# Patient Record
Sex: Female | Born: 1955
Health system: Southern US, Community
[De-identification: ages and names within clinical notes are randomized; demographics above are authoritative.]

## PROBLEM LIST (undated history)

## (undated) DIAGNOSIS — J449 Chronic obstructive pulmonary disease, unspecified: Secondary | ICD-10-CM

## (undated) DIAGNOSIS — R339 Retention of urine, unspecified: Secondary | ICD-10-CM

## (undated) DIAGNOSIS — K279 Peptic ulcer, site unspecified, unspecified as acute or chronic, without hemorrhage or perforation: Secondary | ICD-10-CM

## (undated) DIAGNOSIS — K219 Gastro-esophageal reflux disease without esophagitis: Secondary | ICD-10-CM

## (undated) DIAGNOSIS — N329 Bladder disorder, unspecified: Secondary | ICD-10-CM

## (undated) DIAGNOSIS — F32A Depression, unspecified: Secondary | ICD-10-CM

## (undated) DIAGNOSIS — E785 Hyperlipidemia, unspecified: Secondary | ICD-10-CM

## (undated) DIAGNOSIS — M1990 Unspecified osteoarthritis, unspecified site: Secondary | ICD-10-CM

## (undated) DIAGNOSIS — J45909 Unspecified asthma, uncomplicated: Secondary | ICD-10-CM

## (undated) DIAGNOSIS — I10 Essential (primary) hypertension: Secondary | ICD-10-CM

## (undated) DIAGNOSIS — F329 Major depressive disorder, single episode, unspecified: Secondary | ICD-10-CM

## (undated) HISTORY — PX: ABDOMINAL HYSTERECTOMY: SHX81

## (undated) HISTORY — PX: VAGINAL HYSTERECTOMY: SUR661

## (undated) HISTORY — PX: PILONIDAL CYST EXCISION: SHX744

## (undated) HISTORY — DX: Retention of urine, unspecified: R33.9

---

## 1997-11-25 ENCOUNTER — Emergency Department (HOSPITAL_COMMUNITY): Admission: EM | Admit: 1997-11-25 | Discharge: 1997-11-25 | Payer: Self-pay | Admitting: Emergency Medicine

## 2000-02-13 ENCOUNTER — Emergency Department (HOSPITAL_COMMUNITY): Admission: EM | Admit: 2000-02-13 | Discharge: 2000-02-13 | Payer: Self-pay | Admitting: Emergency Medicine

## 2005-01-13 ENCOUNTER — Inpatient Hospital Stay (HOSPITAL_COMMUNITY): Admission: EM | Admit: 2005-01-13 | Discharge: 2005-01-15 | Payer: Self-pay | Admitting: Emergency Medicine

## 2005-01-13 ENCOUNTER — Ambulatory Visit: Payer: Self-pay | Admitting: Internal Medicine

## 2007-05-31 ENCOUNTER — Emergency Department (HOSPITAL_COMMUNITY): Admission: EM | Admit: 2007-05-31 | Discharge: 2007-05-31 | Payer: Self-pay | Admitting: Emergency Medicine

## 2007-06-07 ENCOUNTER — Emergency Department (HOSPITAL_COMMUNITY): Admission: EM | Admit: 2007-06-07 | Discharge: 2007-06-07 | Payer: Self-pay | Admitting: Family Medicine

## 2007-12-24 ENCOUNTER — Emergency Department (HOSPITAL_COMMUNITY): Admission: EM | Admit: 2007-12-24 | Discharge: 2007-12-24 | Payer: Self-pay | Admitting: Emergency Medicine

## 2008-01-28 ENCOUNTER — Emergency Department (HOSPITAL_COMMUNITY): Admission: EM | Admit: 2008-01-28 | Discharge: 2008-01-28 | Payer: Self-pay | Admitting: Emergency Medicine

## 2008-01-29 ENCOUNTER — Emergency Department (HOSPITAL_COMMUNITY): Admission: EM | Admit: 2008-01-29 | Discharge: 2008-01-29 | Payer: Self-pay | Admitting: Family Medicine

## 2008-06-07 ENCOUNTER — Emergency Department (HOSPITAL_COMMUNITY): Admission: EM | Admit: 2008-06-07 | Discharge: 2008-06-07 | Payer: Self-pay | Admitting: Emergency Medicine

## 2008-09-23 ENCOUNTER — Emergency Department (HOSPITAL_COMMUNITY): Admission: EM | Admit: 2008-09-23 | Discharge: 2008-09-23 | Payer: Self-pay | Admitting: Emergency Medicine

## 2009-03-04 ENCOUNTER — Emergency Department (HOSPITAL_COMMUNITY): Admission: EM | Admit: 2009-03-04 | Discharge: 2009-03-04 | Payer: Self-pay | Admitting: Emergency Medicine

## 2009-04-13 ENCOUNTER — Emergency Department (HOSPITAL_COMMUNITY): Admission: EM | Admit: 2009-04-13 | Discharge: 2009-04-13 | Payer: Self-pay | Admitting: Emergency Medicine

## 2009-06-20 ENCOUNTER — Emergency Department (HOSPITAL_COMMUNITY): Admission: EM | Admit: 2009-06-20 | Discharge: 2009-06-20 | Payer: Self-pay | Admitting: Emergency Medicine

## 2009-07-10 ENCOUNTER — Emergency Department (HOSPITAL_COMMUNITY): Admission: EM | Admit: 2009-07-10 | Discharge: 2009-07-10 | Payer: Self-pay | Admitting: Emergency Medicine

## 2009-07-10 ENCOUNTER — Ambulatory Visit: Payer: Self-pay | Admitting: Psychiatry

## 2009-07-10 ENCOUNTER — Inpatient Hospital Stay (HOSPITAL_COMMUNITY): Admission: AD | Admit: 2009-07-10 | Discharge: 2009-07-16 | Payer: Self-pay | Admitting: Psychiatry

## 2009-08-15 ENCOUNTER — Other Ambulatory Visit: Payer: Self-pay | Admitting: Emergency Medicine

## 2009-08-16 ENCOUNTER — Observation Stay (HOSPITAL_COMMUNITY): Admission: RE | Admit: 2009-08-16 | Discharge: 2009-08-17 | Payer: Self-pay | Admitting: Internal Medicine

## 2010-02-26 ENCOUNTER — Emergency Department (HOSPITAL_COMMUNITY)
Admission: EM | Admit: 2010-02-26 | Discharge: 2010-02-26 | Payer: Self-pay | Source: Home / Self Care | Admitting: Family Medicine

## 2010-06-15 LAB — COMPREHENSIVE METABOLIC PANEL
ALT: 15 U/L (ref 0–35)
AST: 25 U/L (ref 0–37)
Albumin: 3.8 g/dL (ref 3.5–5.2)
Alkaline Phosphatase: 87 U/L (ref 39–117)
BUN: 15 mg/dL (ref 6–23)
CO2: 28 mEq/L (ref 19–32)
Calcium: 9.1 mg/dL (ref 8.4–10.5)
Chloride: 102 mEq/L (ref 96–112)
Creatinine, Ser: 0.6 mg/dL (ref 0.4–1.2)
GFR calc Af Amer: >60 mL/min (ref >60)
GFR calc non Af Amer: >60 mL/min (ref >60)
Glucose, Bld: 113 mg/dL — ABNORMAL HIGH (ref 70–99)
Potassium: 3.6 mEq/L (ref 3.5–5.1)
Sodium: 140 mEq/L (ref 135–145)
Total Bilirubin: 0.3 mg/dL (ref 0.3–1.2)
Total Protein: 6.7 g/dL (ref 6.0–8.3)

## 2010-06-15 LAB — ANA: Anti Nuclear Antibody(ANA): NEGATIVE

## 2010-06-15 LAB — B. BURGDORFI ANTIBODIES: B burgdorferi Ab IgG+IgM: 0.5 {ISR}

## 2010-06-15 LAB — DIFFERENTIAL
Basophils Absolute: 0 10*3/uL (ref 0.0–0.1)
Basophils Relative: 0 % (ref 0–1)
Eosinophils Absolute: 0.1 10*3/uL (ref 0.0–0.7)
Eosinophils Relative: 1 % (ref 0–5)
Lymphocytes Relative: 22 % (ref 12–46)
Lymphs Abs: 2.5 10*3/uL (ref 0.7–4.0)
Monocytes Absolute: 0.5 10*3/uL (ref 0.1–1.0)
Monocytes Relative: 5 % (ref 3–12)
Neutro Abs: 7.9 10*3/uL — ABNORMAL HIGH (ref 1.7–7.7)
Neutrophils Relative %: 72 % (ref 43–77)

## 2010-06-15 LAB — PARVOVIRUS B19 ANTIBODY, IGG AND IGM
Parovirus B19 IgG Abs: 5.5 Index — ABNORMAL HIGH (ref <0.9)
Parovirus B19 IgM Abs: <0.9 Index (ref <0.9)

## 2010-06-15 LAB — URINE MICROSCOPIC-ADD ON

## 2010-06-15 LAB — RPR: RPR Ser Ql: NONREACTIVE

## 2010-06-15 LAB — URINALYSIS, ROUTINE W REFLEX MICROSCOPIC
Bilirubin Urine: NEGATIVE
Glucose, UA: NEGATIVE mg/dL
Hgb urine dipstick: NEGATIVE
Ketones, ur: NEGATIVE mg/dL
Nitrite: NEGATIVE
Protein, ur: NEGATIVE mg/dL
Specific Gravity, Urine: 1.009 (ref 1.005–1.030)
Urobilinogen, UA: 0.2 mg/dL (ref 0.0–1.0)
pH: 6.5 (ref 5.0–8.0)

## 2010-06-15 LAB — CBC
HCT: 35.2 % — ABNORMAL LOW (ref 36.0–46.0)
Hemoglobin: 12.4 g/dL (ref 12.0–15.0)
MCHC: 35.3 g/dL (ref 30.0–36.0)
MCV: 90.9 fL (ref 78.0–100.0)
Platelets: 347 10*3/uL (ref 150–400)
RBC: 3.87 MIL/uL (ref 3.87–5.11)
RDW: 14 % (ref 11.5–15.5)
WBC: 11 10*3/uL — ABNORMAL HIGH (ref 4.0–10.5)

## 2010-06-15 LAB — SEDIMENTATION RATE: Sed Rate: 3 mm/hr (ref 0–22)

## 2010-06-15 LAB — RHEUMATOID FACTOR: Rheumatoid fact SerPl-aCnc: <20 IU/mL (ref 0–20)

## 2010-06-16 LAB — DIFFERENTIAL
Basophils Absolute: 0.1 10*3/uL (ref 0.0–0.1)
Basophils Relative: 0 % (ref 0–1)
Eosinophils Absolute: 0 10*3/uL (ref 0.0–0.7)
Eosinophils Relative: 0 % (ref 0–5)
Lymphocytes Relative: 19 % (ref 12–46)
Lymphs Abs: 2.6 10*3/uL (ref 0.7–4.0)
Monocytes Absolute: 1 10*3/uL (ref 0.1–1.0)
Monocytes Relative: 8 % (ref 3–12)
Neutro Abs: 9.9 10*3/uL — ABNORMAL HIGH (ref 1.7–7.7)
Neutrophils Relative %: 73 % (ref 43–77)

## 2010-06-16 LAB — URINE MICROSCOPIC-ADD ON

## 2010-06-16 LAB — HEPATIC FUNCTION PANEL
ALT: 14 U/L (ref 0–35)
AST: 18 U/L (ref 0–37)
Albumin: 3.3 g/dL — ABNORMAL LOW (ref 3.5–5.2)
Alkaline Phosphatase: 62 U/L (ref 39–117)
Bilirubin, Direct: 0.1 mg/dL (ref 0.0–0.3)
Indirect Bilirubin: 0.7 mg/dL (ref 0.3–0.9)
Total Bilirubin: 0.8 mg/dL (ref 0.3–1.2)
Total Protein: 6.3 g/dL (ref 6.0–8.3)

## 2010-06-16 LAB — RPR: RPR Ser Ql: NONREACTIVE

## 2010-06-16 LAB — URINALYSIS, ROUTINE W REFLEX MICROSCOPIC
Bilirubin Urine: NEGATIVE
Glucose, UA: NEGATIVE mg/dL
Hgb urine dipstick: NEGATIVE
Ketones, ur: NEGATIVE mg/dL
Nitrite: NEGATIVE
Protein, ur: NEGATIVE mg/dL
Specific Gravity, Urine: 1.011 (ref 1.005–1.030)
Urobilinogen, UA: 0.2 mg/dL (ref 0.0–1.0)
pH: 7 (ref 5.0–8.0)

## 2010-06-16 LAB — CBC
HCT: 34.2 % — ABNORMAL LOW (ref 36.0–46.0)
Hemoglobin: 11.6 g/dL — ABNORMAL LOW (ref 12.0–15.0)
MCHC: 33.9 g/dL (ref 30.0–36.0)
MCV: 93.9 fL (ref 78.0–100.0)
Platelets: 546 10*3/uL — ABNORMAL HIGH (ref 150–400)
RBC: 3.64 MIL/uL — ABNORMAL LOW (ref 3.87–5.11)
RDW: 15 % (ref 11.5–15.5)
WBC: 13.6 10*3/uL — ABNORMAL HIGH (ref 4.0–10.5)

## 2010-06-16 LAB — TSH: TSH: 0.486 u[IU]/mL (ref 0.350–4.500)

## 2010-06-16 LAB — URINE CULTURE
Colony Count: >=100000
Special Requests: NEGATIVE

## 2010-06-17 LAB — COMPREHENSIVE METABOLIC PANEL
ALT: 18 U/L (ref 0–35)
AST: 18 U/L (ref 0–37)
Albumin: 4 g/dL (ref 3.5–5.2)
Alkaline Phosphatase: 75 U/L (ref 39–117)
BUN: 6 mg/dL (ref 6–23)
CO2: 27 mEq/L (ref 19–32)
Calcium: 9.2 mg/dL (ref 8.4–10.5)
Chloride: 100 mEq/L (ref 96–112)
Creatinine, Ser: 0.45 mg/dL (ref 0.4–1.2)
GFR calc Af Amer: >60 mL/min (ref >60)
GFR calc non Af Amer: >60 mL/min (ref >60)
Glucose, Bld: 96 mg/dL (ref 70–99)
Potassium: 3.3 mEq/L — ABNORMAL LOW (ref 3.5–5.1)
Sodium: 136 mEq/L (ref 135–145)
Total Bilirubin: 0.2 mg/dL — ABNORMAL LOW (ref 0.3–1.2)
Total Protein: 7.1 g/dL (ref 6.0–8.3)

## 2010-06-17 LAB — DIFFERENTIAL
Band Neutrophils: 0 % (ref 0–10)
Basophils Absolute: 0 10*3/uL (ref 0.0–0.1)
Basophils Relative: 0 % (ref 0–1)
Blasts: 0 %
Eosinophils Absolute: 0.1 10*3/uL (ref 0.0–0.7)
Eosinophils Relative: 1 % (ref 0–5)
Lymphocytes Relative: 50 % — ABNORMAL HIGH (ref 12–46)
Lymphs Abs: 6.1 10*3/uL — ABNORMAL HIGH (ref 0.7–4.0)
Metamyelocytes Relative: 0 %
Monocytes Absolute: 0.9 10*3/uL (ref 0.1–1.0)
Monocytes Relative: 7 % (ref 3–12)
Myelocytes: 0 %
Neutro Abs: 5.1 10*3/uL (ref 1.7–7.7)
Neutrophils Relative %: 42 % — ABNORMAL LOW (ref 43–77)
Promyelocytes Absolute: 0 %
nRBC: 0 /100 WBC

## 2010-06-17 LAB — RAPID URINE DRUG SCREEN, HOSP PERFORMED
Amphetamines: NOT DETECTED
Barbiturates: NOT DETECTED
Benzodiazepines: NOT DETECTED
Cocaine: NOT DETECTED
Opiates: NOT DETECTED
Tetrahydrocannabinol: NOT DETECTED

## 2010-06-17 LAB — CBC
HCT: 38.7 % (ref 36.0–46.0)
Hemoglobin: 13.1 g/dL (ref 12.0–15.0)
MCHC: 34 g/dL (ref 30.0–36.0)
MCV: 93.2 fL (ref 78.0–100.0)
Platelets: 675 10*3/uL — ABNORMAL HIGH (ref 150–400)
RBC: 4.15 MIL/uL (ref 3.87–5.11)
RDW: 15.3 % (ref 11.5–15.5)
WBC: 12.2 10*3/uL — ABNORMAL HIGH (ref 4.0–10.5)

## 2010-06-17 LAB — ETHANOL
Alcohol, Ethyl (B): 236 mg/dL — ABNORMAL HIGH (ref 0–10)
Alcohol, Ethyl (B): 6 mg/dL (ref 0–10)

## 2010-06-18 ENCOUNTER — Inpatient Hospital Stay (INDEPENDENT_AMBULATORY_CARE_PROVIDER_SITE_OTHER)
Admission: RE | Admit: 2010-06-18 | Discharge: 2010-06-18 | Disposition: A | Discharge status: Home / Self Care | Payer: Self-pay | Source: Ambulatory Visit | Attending: Emergency Medicine | Admitting: Emergency Medicine

## 2010-06-18 DIAGNOSIS — J40 Bronchitis, not specified as acute or chronic: Secondary | ICD-10-CM

## 2010-06-18 DIAGNOSIS — I10 Essential (primary) hypertension: Secondary | ICD-10-CM

## 2010-07-06 LAB — CBC
HCT: 41.3 % (ref 36.0–46.0)
Hemoglobin: 14.5 g/dL (ref 12.0–15.0)
MCHC: 35.1 g/dL (ref 30.0–36.0)
MCV: 99.3 fL (ref 78.0–100.0)
Platelets: 411 10*3/uL — ABNORMAL HIGH (ref 150–400)
RBC: 4.15 MIL/uL (ref 3.87–5.11)
RDW: 13.5 % (ref 11.5–15.5)
WBC: 10.8 10*3/uL — ABNORMAL HIGH (ref 4.0–10.5)

## 2010-07-06 LAB — RAPID URINE DRUG SCREEN, HOSP PERFORMED
Amphetamines: NOT DETECTED
Barbiturates: NOT DETECTED
Benzodiazepines: POSITIVE — AB
Cocaine: NOT DETECTED
Opiates: NOT DETECTED
Tetrahydrocannabinol: NOT DETECTED

## 2010-07-06 LAB — DIFFERENTIAL
Basophils Absolute: 0.1 10*3/uL (ref 0.0–0.1)
Basophils Relative: 1 % (ref 0–1)
Eosinophils Absolute: 0.1 10*3/uL (ref 0.0–0.7)
Eosinophils Relative: 1 % (ref 0–5)
Lymphocytes Relative: 29 % (ref 12–46)
Lymphs Abs: 3.1 10*3/uL (ref 0.7–4.0)
Monocytes Absolute: 0.6 10*3/uL (ref 0.1–1.0)
Monocytes Relative: 6 % (ref 3–12)
Neutro Abs: 6.9 10*3/uL (ref 1.7–7.7)
Neutrophils Relative %: 63 % (ref 43–77)

## 2010-07-06 LAB — POCT I-STAT, CHEM 8
BUN: 16 mg/dL (ref 6–23)
Calcium, Ion: 1.14 mmol/L (ref 1.12–1.32)
Chloride: 98 mEq/L (ref 96–112)
Creatinine, Ser: 0.6 mg/dL (ref 0.4–1.2)
Glucose, Bld: 104 mg/dL — ABNORMAL HIGH (ref 70–99)
HCT: 47 % — ABNORMAL HIGH (ref 36.0–46.0)
Hemoglobin: 16 g/dL — ABNORMAL HIGH (ref 12.0–15.0)
Potassium: 3.8 mEq/L (ref 3.5–5.1)
Sodium: 131 mEq/L — ABNORMAL LOW (ref 135–145)
TCO2: 24 mmol/L (ref 0–100)

## 2010-07-06 LAB — ETHANOL: Alcohol, Ethyl (B): <5 mg/dL (ref 0–10)

## 2010-08-14 NOTE — Discharge Summary (Signed)
NAMERoma Scott               ACCOUNT NO.:  [id number]   MEDICAL RECORD NO.:  [id number]          PATIENT TYPE:  INP   LOCATION:  3030                         FACILITY:  [facility]   PHYSICIAN:  Duncan Dull, M.D.     DATE OF BIRTH:  02-01-1956   DATE OF ADMISSION:  01/13/2005  DATE OF DISCHARGE:  01/15/2005                                 DISCHARGE SUMMARY   DISCHARGE DIAGNOSES:  1.  Upper gastrointestinal bleed secondary to duodenal ulcer with positive      Helicobacter pylori.  2.  Anemia.  3.  Urinary tract infection.  4.  Chronic low back pain.   DISCHARGE MEDICATIONS:  1.  Darvocet 50/325 one to two p.o. q.4h. p.r.n.  2.  Nicotine patch 21 mg one patch daily x2 weeks, then 14 mg patch daily x1      week, then 7 mg patch daily for one week, then stop.  3.  Amoxicillin 1000 mg p.o. b.i.d. x12 days.  4.  Clarithromycin 500 mg p.o. daily x12 more days.  5.  Ciprofloxacin 250 mg p.o. b.i.d. x2 more days.  6.  Protonix 40 mg or omeprazole 20 mg one p.o. b.i.d. x12 more days and      once a day x6 weeks.   CONDITION ON DISCHARGE:  Stable and improved.  The patient was instructed to  follow up in the outpatient clinic with Dr. Landis Martins on Wednesday, January 18, 2005 at 3:30 p.m.  Patient was instructed to stop taking Goody powder, stop  drinking alcohol, and stop smoking cigarettes.  She was also instructed to  stop taking aspirin, ibuprofen, Naproxen, or other NSAIDs and was instructed  to only use Tylenol for pain besides the Darvocet.  A stat CBC will need to  be drawn at hospital follow-up.   PROCEDURES:  1.  Chest x-ray on January 13, 2005 showed no acute cardiopulmonary      findings.  2.  X-ray of the pelvis on January 13, 2005 showed no significant bony      findings.  3.  X-ray of the lumbar spine on January 13, 2005 showed normal alignment      and no acute bony findings with dense aortic calcifications.  4.  Upper endoscopy on January 13, 2005 revealed an ulcer in  the duodenal      bulb at 2 o'clock that measured 12 x 14 mm.  It was actively bleeding      and oozing.  The ulcer was washed with water.  An image was taken.  RUT      was done.  Results are pending.  The ulcer was injected with epinephrine      which stopped the bleeding.  She also had normal V-line at 35 cm,      gastritis in the antrum and CLO was done.   CONSULTANTS:  Dr. Luther Parody, GI   ADMISSION HISTORY AND PHYSICAL:  Please see the chart for full details of  the admission H&P.  In summary, Monica Scott is a 55 year old Caucasian  female with a history of peptic ulcer disease and  low back pain for which  she uses three to four packs of Goody powders per day, tobacco abuse,  alcohol abuse, and neurogenic bladder who presents with a complaint of tarry  stools x2 days.  Patient noticed her first tarry stool yesterday, one day  prior to admission.  She had four bowel movements and two today, all were  tarry.  She had some suprapubic pain associated with her bowel movements on  the day of admission which were relieved by defecation and she has no other  abdominal pain.  She denies nausea, vomiting.  She has never had melena in  the past; however, she does have a history of peptic ulcer disease, no  bleeding and it was diagnosed with an upper GI series 15 years ago.  Patient  uses three to four packs of Goody powder per day for low back pain which is  chronic secondary to her job.  She drinks six to eight beers per day.  She  complains of indigestion times a few days which is unusual for her.  Patient  also smokes one and a half packs per day x25 years.  She has a history of  neurogenic bladder and self catheterizations q.3-4h. x10 years.  She  recently noted some blood on her catheter, but denies pain or gross  hematuria.  She denies chest pain, shortness of breath, palpitations.   REVIEW OF SYSTEMS:  Positive for fatigue and weakness, chronic cough,  nonproductive.  She normally has  one formed brown stool per day.  Denies  hematochezia.   PHYSICAL EXAMINATION:  VITAL SIGNS:  Temperature 98.6, blood pressure 115-  138/73-85, pulse 78-113, respirations 20, oxygen saturation 96% on room air.  GENERAL:  She was in no acute distress.  HEENT:  Eyes:  Pupils were equally round and reactive to light and  accommodation.  Extraocular movements were intact.  Her conjunctivae showed  no pallor and she was anicteric.  ENT:  She had normal oropharynx mucosa,  poor dentition, and a white plaque on her tongue.  NECK:  Supple with no thyromegaly, no bruits, and no JVD.  LUNGS:  Respirations were clear to auscultation bilaterally with good air  movement.  No rales, wheezes, or crackles were heard.  CARDIOVASCULAR:  Normal S1 and S2.  Regular rate and rhythm.  No murmurs,  rubs, or gallops.  GASTROINTESTINAL:  Soft, nontender.  Bowel sounds were present.  The liver  edge was palpable and there was no splenomegaly.  EXTREMITIES:  No edema.  Pulses were 2+.  SKIN:  Freckled.  LYMPH:  No lymphadenopathy except for a right axillary nodule which was  small, nontender, and mobile.  MUSCULOSKELETAL:  Normal range of motion.  NEUROLOGIC:  Grossly nonfocal.  PSYCHIATRIC:  Appropriate.   ADMISSION LABORATORIES:  WBC count was 7.8, RBC 3.66, hemoglobin 12.2,  hematocrit 35.2, MCV 96.2, platelets 376.  She was fecal occult blood  positive.  Her reticulocyte count was 1.8%.  RBC 3.34.  Absolute  reticulocyte was 60.1.  PT 12.5, INR 0.9, PTT 24.  Sodium 130, potassium  3.6, chloride 98, bicarbonate 24, glucose 99, BUN 13, creatinine 0.7,  calcium 8.5, total protein 5.9, albumin 4.  AST 23, ALT 17, alkaline  phosphatase 71, total bilirubin 0.5.  TSH was normal at 2.47.  Iron was 61,  TIBC 245, percent saturation 25, ferritin 69.  Beta hCG was less than 2.  Alcohol level was less than 5.  UA was positive for trace leukocyte  esterase,  otherwise it was negative.  Helicobacter culture was positive  for urease.   HOSPITAL COURSE:  #1 - UPPER GASTROINTESTINAL BLEED SECONDARY TO DUODENAL  ULCER:  Secondary to chronic use of Goody powders, alcohol, and tobacco with  H. pylori test positivity.  The patient was admitted for melena and Dr.  Luther Parody was consulted on the day of admission, took her for upper  endoscopy on the day of admission which revealed a 12 x 14 mm duodenal ulcer  that was actively bleeding.  Biopsy for H. pylori and CLO test was positive.  The ulcer was injected with epinephrine with success in stopping the  bleeding.  Patient did not experience any further episodes of melena.  The  patient did not ever require a transfusion while in the hospital and was  discharged in stable and improved condition without any evidence of melena  or hematochezia.  She was given instructions to complete a course of  antibiotic treatment for the H. pylori as well as she was given instructions  to continue PPI treatment for six weeks.  Once the H. pylori has been  treated a stool sample should be collected six weeks later to test for H.  pylori at that time.  If it is still positive she will need to be treated  again  for H. pylori. Patient was instructed to discontinue Goody powder, alcohol,  cigarettes, and any other NSAIDs.   #2 ANEMIA:  secondary to iron deficiency.  Iron studies were checked and her  iron level was 61, TIBC 245, percent saturation 25, and her ferritin was 69.  Therefore, it might be considered to start the patient on iron therapy as an  outpatient.  She will also need routine screening colonoscopy to work up the  iron deficiency anemia if it persists as the patient is 55 years old and  next year will be age-appropriate for screening colonoscopy.  The patient's  iron deficiency anemia right now is most likely secondary to the oozing  upper GI bleed that she experienced during this admission.  Iron studies  should be repeated as an outpatient in light of this  information.   #3 - URINARY TRACT INFECTION:  The patient has a neurogenic bladder  secondary to some pelvic surgery she had about 10 years ago and has had to  self catheterize since then q.3-4h.  She denies any urinary tract infection  symptoms; however, with a slightly dirty urine we treated her empirically  with Cipro for a UTI.  The patient was to complete a course of three days of  Cipro and was given a prescription for the remaining two days of Cipro to be  taken as an outpatient.   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  However, pelvic x-ray as well as a lumbar spine x-  ray were done to rule out any occult fractures.  The pelvic x-ray and lumbar  spine x-ray were both negative for fractures or any other abnormalities that  would explain the low back pain.  The patient was given Darvocet in order to control her low back pain which did provide her with relief.  The patient  was taking Marlin Canary powders several times a day in order to control her low  back pain which is how she ended up getting the duodenal ulcer.  It was  explained to the patient that she must discontinue all Goody powders as  well  as NSAIDs in order to prevent a repeat upper GI bleed.  She expressed  understanding and was receiving relief with the Darvocet which has Tylenol  in it, so should not cause the upper GI bleed to recur.   DISCHARGE LABORATORIES AND VITAL SIGNS:  On the day of discharge her vital  signs were temperature 97, pulse 90, respirations 20, blood pressure 129/83.  She was saturating at 99% on room air.  White blood cell count was 6.3, RBC  3.39, hemoglobin 11.3, hematocrit 32.9, MCV 97.1, RDW 13.3, platelets 361.  Sodium 138, potassium 3.7, chloride 108, bicarbonate 23, glucose 107, BUN 4,  creatinine 0.8, calcium 8.4.  Fasting lipid profile:  Cholesterol 169,  triglycerides 150, HDL 44, LDL 95, VLDL 30.  TSH was normal at 2.47.    PENDING LABORATORIES:  None.     ______________________________  Chauncey Reading, D.O.      Duncan Dull, M.D.  Electronically Signed    EA/MEDQ  D:  02/22/2005  T:  02/23/2005  Job:  [id number]   cc:   Althea Grimmer. Luther Parody, M.D.  Fax: [phone]

## 2010-08-14 NOTE — Consult Note (Signed)
NAMEFrancisco Scott NO.:  [id number]   MEDICAL RECORD NO.:  [id number]          PATIENT TYPE:  EMS   LOCATION:  MAJO                         FACILITY:  [facility]   PHYSICIAN:  Althea Grimmer. Santogade, M.D.DATE OF BIRTH:  1955-05-21   DATE OF CONSULTATION:  01/13/2005  DATE OF DISCHARGE:                                   CONSULTATION   HISTORY OF PRESENT ILLNESS:  Ms. Monica Scott is 55 year old female who presents  to the emergency room complaining of black tarry diarrhea yesterday with a  repeat episode this morning.  She denies abdominal pain, however, she  reports that she had a history of ulcer disease over 10 years ago diagnosed  on an upper GI series. She does complain of feeling dizzy and fatigued and  says her appetite is decreased. She has been having back pain and using 3-4  packets of Goody powder per day. She also smokes a pack and a half of  cigarettes per day and has six beers nightly.   PAST MEDICAL HISTORY:  1.  Hysterectomy.  2.  Reported neurogenic bladder with self-catheterization five or six times      a day.  3.  She has chronic back pain problems.  4.  Polysubstance abuse.   CURRENT MEDICATIONS:  1.  Goody powders.  2.  Sudafed.  3.  Multivitamins.   ALLERGIES:  None reported.   FAMILY HISTORY:  Reportedly, her sister has colonic polyps.   SOCIAL HISTORY:  Smokes a pack and a half of cigarettes per day, drinks at  least six beers every night.  Cleans houses for a living.   REVIEW OF SYSTEMS:  As noted in above problems.   PHYSICAL EXAMINATION:  VITAL SIGNS:  She is afebrile and in no acute  distress. Blood pressure 115/73, pulse 78 when lying down but, on admission,  it was 113.  SKIN:  normal.  EENT:  Anicteric oropharynx nicotine stains poor dentition.  NECK:  Supple without thyromegaly.  CHEST:  A few scattered rhonchi.  HEART:  Sounds regular rate and rhythm.  ABDOMEN:  Soft. Liver edge is palpable. There is no splenomegaly or  tenderness. Bowel sounds were normal.  RECTAL:  Exam was not repeated but was guaiac positive.  EXTREMITIES:  Without cyanosis, clubbing, edema or rash.   LABORATORY DATA:  Hemoglobin of 12.2, BUN of 13. Liver function tests are  normal. Fecal occult blood testing positive.   IMPRESSION:  This is a 55 year old female with a probable upper GI bleed,  which is mild to moderate.  She is at the great risk for gastritis or more  likely ulcer disease due to alcohol abuse, heavy smoking and use of Goody  powders.   PLAN:  Urgent upper endoscopy will be performed tonight.  Please see that  report and further recommendations.      Althea Grimmer. Luther Parody, M.D.  Electronically Signed     PJS/MEDQ  D:  01/13/2005  T:  01/13/2005  Job:  [id number]   cc:   Duncan Dull, M.D.  Fax: [phone]

## 2010-11-11 ENCOUNTER — Inpatient Hospital Stay (INDEPENDENT_AMBULATORY_CARE_PROVIDER_SITE_OTHER)
Admission: RE | Admit: 2010-11-11 | Discharge: 2010-11-11 | Disposition: A | Discharge status: Home / Self Care | Payer: Self-pay | Source: Ambulatory Visit | Attending: Emergency Medicine | Admitting: Emergency Medicine

## 2010-11-11 DIAGNOSIS — J45909 Unspecified asthma, uncomplicated: Secondary | ICD-10-CM

## 2010-12-21 LAB — RAPID URINE DRUG SCREEN, HOSP PERFORMED
Amphetamines: NOT DETECTED
Barbiturates: NOT DETECTED
Benzodiazepines: NOT DETECTED
Cocaine: NOT DETECTED
Opiates: NOT DETECTED
Tetrahydrocannabinol: NOT DETECTED

## 2010-12-21 LAB — URINALYSIS, ROUTINE W REFLEX MICROSCOPIC
Bilirubin Urine: NEGATIVE
Glucose, UA: NEGATIVE
Hgb urine dipstick: NEGATIVE
Ketones, ur: NEGATIVE
Nitrite: NEGATIVE
Protein, ur: NEGATIVE
Specific Gravity, Urine: 1.008
Urobilinogen, UA: 0.2
pH: 6

## 2010-12-21 LAB — DIFFERENTIAL
Basophils Absolute: 0.1
Basophils Relative: 1
Eosinophils Absolute: 0
Eosinophils Relative: 0
Lymphocytes Relative: 17
Lymphs Abs: 1.8
Monocytes Absolute: 0.4
Monocytes Relative: 4
Neutro Abs: 8.7 — ABNORMAL HIGH
Neutrophils Relative %: 79 — ABNORMAL HIGH

## 2010-12-21 LAB — HEPATIC FUNCTION PANEL
ALT: 16
AST: 21
Albumin: 3.8
Alkaline Phosphatase: 78
Bilirubin, Direct: 0.2
Indirect Bilirubin: 0.5
Total Bilirubin: 0.7
Total Protein: 5.9 — ABNORMAL LOW

## 2010-12-21 LAB — CBC
HCT: 42.9
Hemoglobin: 14.9
MCHC: 34.7
MCV: 97.2
Platelets: 397
RBC: 4.42
RDW: 13.7
WBC: 11 — ABNORMAL HIGH

## 2010-12-21 LAB — CALCIUM: Calcium: 9.4

## 2010-12-21 LAB — I-STAT 8, (EC8 V) (CONVERTED LAB)
Acid-Base Excess: 1
BUN: 9
Bicarbonate: 25.8 — ABNORMAL HIGH
Chloride: 104
Glucose, Bld: 134 — ABNORMAL HIGH
HCT: 48 — ABNORMAL HIGH
Hemoglobin: 16.3 — ABNORMAL HIGH
Operator id: 295021
Potassium: 3.8
Sodium: 135
TCO2: 27
pCO2, Ven: 40 — ABNORMAL LOW
pH, Ven: 7.418 — ABNORMAL HIGH

## 2010-12-21 LAB — POCT CARDIAC MARKERS
CKMB, poc: <1 — ABNORMAL LOW
CKMB, poc: <1 — ABNORMAL LOW
Myoglobin, poc: 40.5
Myoglobin, poc: 40.6
Operator id: 234501
Operator id: 295021
Troponin i, poc: <0.05
Troponin i, poc: <0.05

## 2010-12-21 LAB — POCT I-STAT CREATININE
Creatinine, Ser: 0.8
Operator id: 295021

## 2010-12-21 LAB — D-DIMER, QUANTITATIVE: D-Dimer, Quant: <0.22

## 2010-12-21 LAB — ETHANOL: Alcohol, Ethyl (B): <5

## 2010-12-21 LAB — LIPASE, BLOOD: Lipase: 25

## 2010-12-21 LAB — MAGNESIUM: Magnesium: 2.1

## 2010-12-21 LAB — PHOSPHORUS: Phosphorus: 3.5

## 2010-12-28 LAB — PROTIME-INR
INR: 0.9
Prothrombin Time: 11.9

## 2010-12-28 LAB — LIPASE, BLOOD: Lipase: 31

## 2010-12-28 LAB — COMPREHENSIVE METABOLIC PANEL
ALT: 28
AST: 51 — ABNORMAL HIGH
Albumin: 4.4
Alkaline Phosphatase: 86
BUN: 7
CO2: 22
Calcium: 9.5
Chloride: 102
Creatinine, Ser: 0.53
GFR calc Af Amer: >60
GFR calc non Af Amer: >60
Glucose, Bld: 115 — ABNORMAL HIGH
Potassium: 5.5 — ABNORMAL HIGH
Sodium: 134 — ABNORMAL LOW
Total Bilirubin: 1.4 — ABNORMAL HIGH
Total Protein: 7.1

## 2010-12-28 LAB — DIFFERENTIAL
Basophils Absolute: 0.2 — ABNORMAL HIGH
Basophils Relative: 2 — ABNORMAL HIGH
Eosinophils Absolute: 0.2
Eosinophils Relative: 2
Lymphocytes Relative: 23
Lymphs Abs: 1.9
Monocytes Absolute: 0.6
Monocytes Relative: 7
Neutro Abs: 5.3
Neutrophils Relative %: 66

## 2010-12-28 LAB — CBC
HCT: 45.8
Hemoglobin: 15.3 — ABNORMAL HIGH
MCHC: 33.4
MCV: 97.6
Platelets: 188
RBC: 4.7
RDW: 14.6
WBC: 8.2

## 2010-12-28 LAB — OCCULT BLOOD X 1 CARD TO LAB, STOOL: Fecal Occult Bld: NEGATIVE

## 2010-12-29 LAB — URINE MICROSCOPIC-ADD ON

## 2010-12-29 LAB — URINALYSIS, ROUTINE W REFLEX MICROSCOPIC
Bilirubin Urine: NEGATIVE
Glucose, UA: NEGATIVE
Hgb urine dipstick: NEGATIVE
Ketones, ur: 15 — AB
Nitrite: NEGATIVE
Protein, ur: NEGATIVE
Specific Gravity, Urine: 1.015
Urobilinogen, UA: 0.2
pH: 6

## 2010-12-29 LAB — POCT I-STAT, CHEM 8
BUN: 8
Calcium, Ion: 1.17
Chloride: 102
Creatinine, Ser: 0.7
Glucose, Bld: 102 — ABNORMAL HIGH
HCT: 47 — ABNORMAL HIGH
Hemoglobin: 16 — ABNORMAL HIGH
Potassium: 4.3
Sodium: 136
TCO2: 26

## 2011-06-16 ENCOUNTER — Encounter: Payer: Self-pay | Admitting: Physician Assistant

## 2011-07-22 ENCOUNTER — Encounter: Payer: Self-pay | Admitting: Physician Assistant

## 2011-08-25 ENCOUNTER — Encounter: Payer: Self-pay | Admitting: Obstetrics and Gynecology

## 2011-09-06 ENCOUNTER — Encounter (HOSPITAL_COMMUNITY): Payer: Self-pay | Admitting: *Deleted

## 2011-09-06 ENCOUNTER — Emergency Department (HOSPITAL_COMMUNITY)
Admission: EM | Admit: 2011-09-06 | Discharge: 2011-09-06 | Disposition: A | Discharge status: Home / Self Care | Payer: Self-pay | Attending: Emergency Medicine | Admitting: Emergency Medicine

## 2011-09-06 DIAGNOSIS — N390 Urinary tract infection, site not specified: Secondary | ICD-10-CM | POA: Insufficient documentation

## 2011-09-06 DIAGNOSIS — F17200 Nicotine dependence, unspecified, uncomplicated: Secondary | ICD-10-CM | POA: Insufficient documentation

## 2011-09-06 DIAGNOSIS — I10 Essential (primary) hypertension: Secondary | ICD-10-CM | POA: Insufficient documentation

## 2011-09-06 HISTORY — DX: Unspecified asthma, uncomplicated: J45.909

## 2011-09-06 HISTORY — DX: Essential (primary) hypertension: I10

## 2011-09-06 LAB — URINE MICROSCOPIC-ADD ON

## 2011-09-06 LAB — URINALYSIS, ROUTINE W REFLEX MICROSCOPIC
Bilirubin Urine: NEGATIVE
Glucose, UA: NEGATIVE mg/dL
Ketones, ur: NEGATIVE mg/dL
Nitrite: NEGATIVE
Protein, ur: NEGATIVE mg/dL
Specific Gravity, Urine: 1.003 — ABNORMAL LOW (ref 1.005–1.030)
Urobilinogen, UA: 0.2 mg/dL (ref 0.0–1.0)
pH: 7 (ref 5.0–8.0)

## 2011-09-06 MED ORDER — LISINOPRIL-HYDROCHLOROTHIAZIDE 10-12.5 MG PO TABS: 1.0000 | ORAL_TABLET | Freq: Every day | ORAL | Status: DC

## 2011-09-06 MED ORDER — NITROFURANTOIN MONOHYD MACRO 100 MG PO CAPS: 100.0000 mg | ORAL_CAPSULE | Freq: Two times a day (BID) | ORAL | Status: DC

## 2011-09-06 NOTE — Discharge Instructions (Signed)
Arterial Hypertension Arterial hypertension (high blood pressure) is a condition of elevated pressure in your blood vessels. Hypertension over a long period of time is a risk factor for strokes, heart attacks, and heart failure. It is also the leading cause of kidney (renal) failure.  CAUSES   In Adults -- Over 90% of all hypertension has no known cause. This is called essential or primary hypertension. In the other 10% of people with hypertension, the increase in blood pressure is caused by another disorder. This is called secondary hypertension. Important causes of secondary hypertension are:   Heavy alcohol use.   Obstructive sleep apnea.   Hyperaldosterosim (Conn's syndrome).   Steroid use.   Chronic kidney failure.   Hyperparathyroidism.   Medications.   Renal artery stenosis.   Pheochromocytoma.   Cushing's disease.   Coarctation of the aorta.   Scleroderma renal crisis.   Licorice (in excessive amounts).   Drugs (cocaine, methamphetamine).  Your caregiver can explain any items above that apply to you.  In Children -- Secondary hypertension is more common and should always be considered.   Pregnancy -- Few women of childbearing age have high blood pressure. However, up to 10% of them develop hypertension of pregnancy. Generally, this will not harm the woman. It Even be a sign of 3 complications of pregnancy: preeclampsia, HELLP syndrome, and eclampsia. Follow up and control with medication is necessary.  SYMPTOMS   This condition normally does not produce any noticeable symptoms. It is usually found during a routine exam.   Malignant hypertension is a late problem of high blood pressure. It Monger have the following symptoms:   Headaches.   Blurred vision.   End-organ damage (this means your kidneys, heart, lungs, and other organs are being damaged).   Stressful situations can increase the blood pressure. If a person with normal blood pressure has their blood  pressure go up while being seen by their caregiver, this is often termed "white coat hypertension." Its importance is not known. It Alkire be related with eventually developing hypertension or complications of hypertension.   Hypertension is often confused with mental tension, stress, and anxiety.  DIAGNOSIS  The diagnosis is made by 3 separate blood pressure measurements. They are taken at least 1 week apart from each other. If there is organ damage from hypertension, the diagnosis Lemire be made without repeat measurements. Hypertension is usually identified by having blood pressure readings:  Above 140/90 mmHg measured in both arms, at 3 separate times, over a couple weeks.   Over 130/80 mmHg should be considered a risk factor and Grisanti require treatment in patients with diabetes.  Blood pressure readings over 120/80 mmHg are called "pre-hypertension" even in non-diabetic patients. To get a true blood pressure measurement, use the following guidelines. Be aware of the factors that can alter blood pressure readings.  Take measurements at least 1 hour after caffeine.   Take measurements 30 minutes after smoking and without any stress. This is another reason to quit smoking - it raises your blood pressure.   Use a proper cuff size. Ask your caregiver if you are not sure about your cuff size.   Most home blood pressure cuffs are automatic. They will measure systolic and diastolic pressures. The systolic pressure is the pressure reading at the start of sounds. Diastolic pressure is the pressure at which the sounds disappear. If you are elderly, measure pressures in multiple postures. Try sitting, lying or standing.   Sit at rest for a minimum of   5 minutes before taking measurements.   You should not be on any medications like decongestants. These are found in many cold medications.   Record your blood pressure readings and review them with your caregiver.  If you have hypertension:  Your caregiver  may do tests to be sure you do not have secondary hypertension (see "causes" above).   Your caregiver may also look for signs of metabolic syndrome. This is also called Syndrome X or Insulin Resistance Syndrome. You may have this syndrome if you have type 2 diabetes, abdominal obesity, and abnormal blood lipids in addition to hypertension.   Your caregiver will take your medical and family history and perform a physical exam.   Diagnostic tests may include blood tests (for glucose, cholesterol, potassium, and kidney function), a urinalysis, or an EKG. Other tests may also be necessary depending on your condition.  PREVENTION  There are important lifestyle issues that you can adopt to reduce your chance of developing hypertension:  Maintain a normal weight.   Limit the amount of salt (sodium) in your diet.   Exercise often.   Limit alcohol intake.   Get enough potassium in your diet. Discuss specific advice with your caregiver.   Follow a DASH diet (dietary approaches to stop hypertension). This diet is rich in fruits, vegetables, and low-fat dairy products, and avoids certain fats.  PROGNOSIS  Essential hypertension cannot be cured. Lifestyle changes and medical treatment can lower blood pressure and reduce complications. The prognosis of secondary hypertension depends on the underlying cause. Many people whose hypertension is controlled with medicine or lifestyle changes can live a normal, healthy life.  RISKS AND COMPLICATIONS  While high blood pressure alone is not an illness, it often requires treatment due to its short- and long-term effects on many organs. Hypertension increases your risk for:  CVAs or strokes (cerebrovascular accident).   Heart failure due to chronically high blood pressure (hypertensive cardiomyopathy).   Heart attack (myocardial infarction).   Damage to the retina (hypertensive retinopathy).   Kidney failure (hypertensive nephropathy).  Your caregiver can  explain list items above that apply to you. Treatment of hypertension can significantly reduce the risk of complications. TREATMENT   For overweight patients, weight loss and regular exercise are recommended. Physical fitness lowers blood pressure.   Mild hypertension is usually treated with diet and exercise. A diet rich in fruits and vegetables, fat-free dairy products, and foods low in fat and salt (sodium) can help lower blood pressure. Decreasing salt intake decreases blood pressure in a 1/3 of people.   Stop smoking if you are a smoker.  The steps above are highly effective in reducing blood pressure. While these actions are easy to suggest, they are difficult to achieve. Most patients with moderate or severe hypertension end up requiring medications to bring their blood pressure down to a normal level. There are several classes of medications for treatment. Blood pressure pills (antihypertensives) will lower blood pressure by their different actions. Lowering the blood pressure by 10 mmHg may decrease the risk of complications by as much as 25%. The goal of treatment is effective blood pressure control. This will reduce your risk for complications. Your caregiver will help you determine the best treatment for you according to your lifestyle. What is excellent treatment for one person, may not be for you. HOME CARE INSTRUCTIONS   Do not smoke.   Follow the lifestyle changes outlined in the "Prevention" section.   If you are on medications, follow the directions   carefully. Blood pressure medications must be taken as prescribed. Skipping doses reduces their benefit. It also puts you at risk for problems.   Follow up with your caregiver, as directed.   If you are asked to monitor your blood pressure at home, follow the guidelines in the "Diagnosis" section above.  SEEK MEDICAL CARE IF:   You think you are having medication side effects.   You have recurrent headaches or lightheadedness.     You have swelling in your ankles.   You have trouble with your vision.  SEEK IMMEDIATE MEDICAL CARE IF:   You have sudden onset of chest pain or pressure, difficulty breathing, or other symptoms of a heart attack.   You have a severe headache.   You have symptoms of a stroke (such as sudden weakness, difficulty speaking, difficulty walking).  MAKE SURE YOU:   Understand these instructions.   Will watch your condition.   Will get help right away if you are not doing well or get worse.  Document Released: 03/15/2005 Document Revised: 03/04/2011 Document Reviewed: 10/13/2006 [facility] Patient Information 2012 [place], [place].Urinary Tract Infection Infections of the urinary tract can start in several places. A bladder infection (cystitis), a kidney infection (pyelonephritis), and a prostate infection (prostatitis) are different types of urinary tract infections (UTIs). They usually get better if treated with medicines (antibiotics) that kill germs. Take all the medicine until it is gone. You or your child may feel better in a few days, but TAKE ALL MEDICINE or the infection may not respond and may become more difficult to treat. HOME CARE INSTRUCTIONS   Drink enough water and fluids to keep the urine clear or pale yellow. Cranberry juice is especially recommended, in addition to large amounts of water.   Avoid caffeine, tea, and carbonated beverages. They tend to irritate the bladder.   Alcohol may irritate the prostate.   Only take over-the-counter or prescription medicines for pain, discomfort, or fever as directed by your caregiver.  To prevent further infections:  Empty the bladder often. Avoid holding urine for long periods of time.   After a bowel movement, women should cleanse from front to back. Use each tissue only once.   Empty the bladder before and after sexual intercourse.  FINDING OUT THE RESULTS OF YOUR TEST Not all test results are available during your visit. If  your or your child's test results are not back during the visit, make an appointment with your caregiver to find out the results. Do not assume everything is normal if you have not heard from your caregiver or the medical facility. It is important for you to follow up on all test results. SEEK MEDICAL CARE IF:   There is back pain.   Your baby is older than 3 months with a rectal temperature of 100.5 F (38.1 C) or higher for more than 1 day.   Your or your child's problems (symptoms) are no better in 3 days. Return sooner if you or your child is getting worse.  SEEK IMMEDIATE MEDICAL CARE IF:   There is severe back pain or lower abdominal pain.   You or your child develops chills.   You have a fever.   Your baby is older than 3 months with a rectal temperature of 102 F (38.9 C) or higher.   Your baby is 29 months old or younger with a rectal temperature of 100.4 F (38 C) or higher.   There is nausea or vomiting.   There is continued  burning or discomfort with urination.  MAKE SURE YOU:   Understand these instructions.   Will watch your condition.   Will get help right away if you are not doing well or get worse.  Document Released: 12/23/2004 Document Revised: 03/04/2011 Document Reviewed: 07/28/2006 [facility] Patient Information 2012 [place], [place].

## 2011-09-06 NOTE — ED Provider Notes (Signed)
History     CSN: [id number]  Arrival date & time 09/06/11  1907   First MD Initiated Contact with Patient 09/06/11 1959      Chief Complaint  Patient presents with  . Hypertension    (Consider location/radiation/quality/duration/timing/severity/associated sxs/prior treatment) HPI Comments: Patient here because she is out of her blood pressure medication for the past month - she states that she has been having headaches associated with her high blood pressure - states she has been taking her brother's medication as well.  She is also here with a fever and lower back pain - she states that she had been working in the yard and initially thought her symptoms were related to this but noted chills and a fever today - she reports that she does self cath and she has a tendency to get UTI's and she thinks this is related.  She   Patient is a 56 y.o. female presenting with hypertension. The history is provided by the patient. No language interpreter was used.  Hypertension This is a chronic problem. The current episode started 1 to 4 weeks ago. The problem occurs constantly. The problem has been unchanged. Associated symptoms include chills, a fever, headaches and urinary symptoms. Pertinent negatives include no abdominal pain, anorexia, arthralgias, change in bowel habit, chest pain, congestion, coughing, diaphoresis, fatigue, joint swelling, myalgias, nausea, neck pain, numbness, rash, sore throat, swollen glands, vertigo, visual change, vomiting or weakness. The symptoms are aggravated by nothing. She has tried nothing for the symptoms. The treatment provided no relief.    Past Medical History  Diagnosis Date  . Hypertension   . Asthma     History reviewed. No pertinent past surgical history.  No family history on file.  History  Substance Use Topics  . Smoking status: Current Everyday Smoker  . Smokeless tobacco: Not on file  . Alcohol Use: Yes    OB History    Grav Para Term  Preterm Abortions TAB SAB Ect Mult Living                  Review of Systems  Constitutional: Positive for fever and chills. Negative for diaphoresis and fatigue.  HENT: Negative for congestion, sore throat and neck pain.   Respiratory: Negative for cough.   Cardiovascular: Negative for chest pain.  Gastrointestinal: Negative for nausea, vomiting, abdominal pain, anorexia and change in bowel habit.  Musculoskeletal: Negative for myalgias, joint swelling and arthralgias.  Skin: Negative for rash.  Neurological: Positive for headaches. Negative for vertigo, weakness and numbness.    Allergies  Review of patient's allergies indicates no known allergies.  Home Medications   Current Outpatient Rx  Name Route Sig Dispense Refill  . PRESCRIPTION MEDICATION Oral Take 1 tablet by mouth daily. Metoprolol    . LISINOPRIL-HYDROCHLOROTHIAZIDE 10-12.5 MG PO TABS Oral Take 1 tablet by mouth daily. 30 tablet 2  . NITROFURANTOIN MONOHYD MACRO 100 MG PO CAPS Oral Take 1 capsule (100 mg total) by mouth 2 (two) times daily. 10 capsule 0    BP 147/85  Pulse 88  Temp(Src) 101.2 F (38.4 C) (Oral)  Resp 19  SpO2 95%  Physical Exam  ED Course  Procedures (including critical care time)  Labs Reviewed  URINALYSIS, ROUTINE W REFLEX MICROSCOPIC - Abnormal; Notable for the following:    Specific Gravity, Urine 1.003 (*)    Hgb urine dipstick TRACE (*)    Leukocytes, UA MODERATE (*)    All other components within normal limits  URINE  MICROSCOPIC-ADD ON - Abnormal; Notable for the following:    Squamous Epithelial / LPF FEW (*)    Bacteria, UA FEW (*)    All other components within normal limits  URINE CULTURE   No results found.   1. Hypertension   2. UTI (urinary tract infection)       MDM  Patient with a history of self cath with fever and headache and flushing - I believe this to be more related to the UTI, I have sent this for culture.  I have also refilled her blood pressure  medication - she currently does not have a PCP and so we will refer her to one.  She has a normal physical examination and I do not believe that further testing is warranted, I doubt meningitis or SAH.       Izola Price [place], [place] 09/07/11 [phone]

## 2011-09-06 NOTE — ED Notes (Signed)
Her bp has been elevated and she has a headache and her face has been flushed  Intermittently.  Her bp has been high for 2 days.  She ran out of her bp meds one month ago.  She has beeb taking her brothers med

## 2011-09-08 LAB — URINE CULTURE
Colony Count: >=100000
Culture  Setup Time: 201306110113

## 2011-09-10 NOTE — ED Provider Notes (Signed)
Medical screening examination/treatment/procedure(s) were performed by non-physician practitioner and as supervising physician I was immediately available for consultation/collaboration.   Dione Booze, MD 09/10/11 [phone]

## 2011-09-10 NOTE — ED Notes (Signed)
Patient called on 6/12 and stated Macrobid RX was too expensive. Rx changed to Cipro by Trixie Dredge PA-C.

## 2011-09-10 NOTE — ED Notes (Signed)
+  Urine. Chart sent to EDP office for review. Chart returned from EDP office. "It looks like patient was put on macrobid--if this is the case, continue--no new medication necessary. If no abx given, give Bactrim DS 1 tab PO BID x 3 days. Return for worsening symptoms." Reviewed by Trixie Dredge PA-C.

## 2012-08-16 ENCOUNTER — Encounter (HOSPITAL_BASED_OUTPATIENT_CLINIC_OR_DEPARTMENT_OTHER): Payer: Self-pay

## 2012-08-16 ENCOUNTER — Emergency Department (HOSPITAL_BASED_OUTPATIENT_CLINIC_OR_DEPARTMENT_OTHER): Payer: Self-pay

## 2012-08-16 ENCOUNTER — Emergency Department (HOSPITAL_BASED_OUTPATIENT_CLINIC_OR_DEPARTMENT_OTHER)
Admission: EM | Admit: 2012-08-16 | Discharge: 2012-08-16 | Disposition: A | Discharge status: Home / Self Care | Payer: Self-pay | Attending: Emergency Medicine | Admitting: Emergency Medicine

## 2012-08-16 DIAGNOSIS — R110 Nausea: Secondary | ICD-10-CM | POA: Insufficient documentation

## 2012-08-16 DIAGNOSIS — F329 Major depressive disorder, single episode, unspecified: Secondary | ICD-10-CM | POA: Insufficient documentation

## 2012-08-16 DIAGNOSIS — R42 Dizziness and giddiness: Secondary | ICD-10-CM | POA: Insufficient documentation

## 2012-08-16 DIAGNOSIS — Z79899 Other long term (current) drug therapy: Secondary | ICD-10-CM | POA: Insufficient documentation

## 2012-08-16 DIAGNOSIS — I10 Essential (primary) hypertension: Secondary | ICD-10-CM | POA: Insufficient documentation

## 2012-08-16 DIAGNOSIS — J45901 Unspecified asthma with (acute) exacerbation: Secondary | ICD-10-CM | POA: Insufficient documentation

## 2012-08-16 DIAGNOSIS — Z87891 Personal history of nicotine dependence: Secondary | ICD-10-CM | POA: Insufficient documentation

## 2012-08-16 DIAGNOSIS — Z8711 Personal history of peptic ulcer disease: Secondary | ICD-10-CM | POA: Insufficient documentation

## 2012-08-16 DIAGNOSIS — F3289 Other specified depressive episodes: Secondary | ICD-10-CM | POA: Insufficient documentation

## 2012-08-16 DIAGNOSIS — R51 Headache: Secondary | ICD-10-CM | POA: Insufficient documentation

## 2012-08-16 HISTORY — DX: Depression, unspecified: F32.A

## 2012-08-16 HISTORY — DX: Bladder disorder, unspecified: N32.9

## 2012-08-16 HISTORY — DX: Major depressive disorder, single episode, unspecified: F32.9

## 2012-08-16 HISTORY — DX: Peptic ulcer, site unspecified, unspecified as acute or chronic, without hemorrhage or perforation: K27.9

## 2012-08-16 LAB — CBC
HCT: 35.8 % — ABNORMAL LOW (ref 36.0–46.0)
Hemoglobin: 12.4 g/dL (ref 12.0–15.0)
MCH: 30.5 pg (ref 26.0–34.0)
MCHC: 34.6 g/dL (ref 30.0–36.0)
MCV: 88 fL (ref 78.0–100.0)
Platelets: 427 10*3/uL — ABNORMAL HIGH (ref 150–400)
RBC: 4.07 MIL/uL (ref 3.87–5.11)
RDW: 13 % (ref 11.5–15.5)
WBC: 8.3 10*3/uL (ref 4.0–10.5)

## 2012-08-16 LAB — URINALYSIS, ROUTINE W REFLEX MICROSCOPIC
Bilirubin Urine: NEGATIVE
Glucose, UA: NEGATIVE mg/dL
Hgb urine dipstick: NEGATIVE
Ketones, ur: NEGATIVE mg/dL
Leukocytes, UA: NEGATIVE
Nitrite: NEGATIVE
Protein, ur: NEGATIVE mg/dL
Specific Gravity, Urine: 1.009 (ref 1.005–1.030)
Urobilinogen, UA: 0.2 mg/dL (ref 0.0–1.0)
pH: 6 (ref 5.0–8.0)

## 2012-08-16 LAB — COMPREHENSIVE METABOLIC PANEL
ALT: 14 U/L (ref 0–35)
AST: 22 U/L (ref 0–37)
Albumin: 4 g/dL (ref 3.5–5.2)
Alkaline Phosphatase: 94 U/L (ref 39–117)
BUN: 13 mg/dL (ref 6–23)
CO2: 25 mEq/L (ref 19–32)
Calcium: 9.7 mg/dL (ref 8.4–10.5)
Chloride: 95 mEq/L — ABNORMAL LOW (ref 96–112)
Creatinine, Ser: 0.6 mg/dL (ref 0.50–1.10)
GFR calc Af Amer: >90 mL/min (ref >90)
GFR calc non Af Amer: >90 mL/min (ref >90)
Glucose, Bld: 108 mg/dL — ABNORMAL HIGH (ref 70–99)
Potassium: 3.7 mEq/L (ref 3.5–5.1)
Sodium: 134 mEq/L — ABNORMAL LOW (ref 135–145)
Total Bilirubin: 0.4 mg/dL (ref 0.3–1.2)
Total Protein: 6.7 g/dL (ref 6.0–8.3)

## 2012-08-16 LAB — PRO B NATRIURETIC PEPTIDE: Pro B Natriuretic peptide (BNP): 80 pg/mL (ref 0–125)

## 2012-08-16 MED ORDER — PREDNISONE 50 MG PO TABS
60.0000 mg | ORAL_TABLET | ORAL | Status: AC
  Administered 2012-08-16: 60 mg via ORAL
  Filled 2012-08-16: qty 1

## 2012-08-16 MED ORDER — PREDNISONE 20 MG PO TABS: 60.0000 mg | ORAL_TABLET | Freq: Every day | ORAL | Status: DC

## 2012-08-16 MED ORDER — ALBUTEROL SULFATE (5 MG/ML) 0.5% IN NEBU
2.5000 mg | INHALATION_SOLUTION | RESPIRATORY_TRACT | Status: AC
  Administered 2012-08-16: 2.5 mg via RESPIRATORY_TRACT
  Filled 2012-08-16: qty 0.5

## 2012-08-16 MED ORDER — ALBUTEROL SULFATE HFA 108 (90 BASE) MCG/ACT IN AERS
1.0000 | INHALATION_SPRAY | RESPIRATORY_TRACT | Status: DC | PRN
  Administered 2012-08-16: 2 via RESPIRATORY_TRACT
  Filled 2012-08-16: qty 6.7

## 2012-08-16 NOTE — ED Notes (Signed)
C/o intermittent dizziness x 1 month-tingling "to my whole head" prompted pt to come to ED today-HA and nausea

## 2012-08-16 NOTE — ED Provider Notes (Signed)
History     CSN: [id number]  Arrival date & time 08/16/12  1100   First MD Initiated Contact with Patient 08/16/12 1117      Chief Complaint  Patient presents with  . Dizziness    (Consider location/radiation/quality/duration/timing/severity/associated sxs/prior treatment) HPI Patient presents with one month of near-syncope. Patient notes no clear precipitant. Since onset symptoms have been persistent, worse with ambulation, when she also feels unsteady do to her ear syncope. There is associated dyspnea, worse with exertion. There is no associated chest pain, minimal headache. Mild nausea, but no vomiting or diarrhea or incontinence. Patient denies fever, chills, cough. She has a history of asthma, possibly also COPD/emphysema, though she is unsure. She states that she has some relief using her relatives'albuterol inhalers. She is a primary care physician.  She recently stopped smoking.  Past Medical History  Diagnosis Date  . Hypertension   . Asthma   . Peptic ulcer   . Depression     Past Surgical History  Procedure Laterality Date  . Abdominal hysterectomy      No family history on file.  History  Substance Use Topics  . Smoking status: Former Games developer  . Smokeless tobacco: Not on file  . Alcohol Use: No    OB History   Grav Para Term Preterm Abortions TAB SAB Ect Mult Living                  Review of Systems  Constitutional:       Per HPI, otherwise negative  HENT:       Per HPI, otherwise negative  Respiratory:       Per HPI, otherwise negative  Cardiovascular:       Per HPI, otherwise negative  Gastrointestinal: Negative for vomiting.  Endocrine:       Negative aside from HPI  Genitourinary:       Neg aside from HPI   Musculoskeletal:       Per HPI, otherwise negative  Skin: Negative.   Neurological: Negative for syncope.    Allergies  Review of patient's allergies indicates no known allergies.  Home Medications   Current Outpatient  Rx  Name  Route  Sig  Dispense  Refill  . Gabapentin (NEURONTIN PO)   Oral   Take by mouth.         . Sertraline HCl (ZOLOFT PO)   Oral   Take by mouth.         Marland Kitchen lisinopril-hydrochlorothiazide (PRINZIDE,ZESTORETIC) 10-12.5 MG per tablet   Oral   Take 1 tablet by mouth daily.   30 tablet   2   . PRESCRIPTION MEDICATION   Oral   Take 1 tablet by mouth daily. Metoprolol           BP 120/80  Pulse 92  Temp(Src) 97.9 F (36.6 C) (Oral)  Resp 18  Ht 4\' 11"  (1.499 m)  Wt 140 lb (63.504 kg)  BMI 28.26 kg/m2  SpO2 94%  Physical Exam  Nursing note and vitals reviewed. Constitutional: She is oriented to person, place, and time. She appears well-developed and well-nourished. No distress.  HENT:  Head: Normocephalic and atraumatic.  Eyes: Conjunctivae and EOM are normal.  Cardiovascular: Normal rate and regular rhythm.   Pulmonary/Chest: Effort normal and breath sounds normal. No stridor. No respiratory distress.  Abdominal: Soft. She exhibits no distension. There is no tenderness.  Musculoskeletal: She exhibits no edema.  Neurological: She is alert and oriented to person, place, and time. No  cranial nerve deficit. She exhibits normal muscle tone. Coordination normal.  Skin: Skin is warm and dry.  Psychiatric: She has a normal mood and affect.    ED Course  Procedures (including critical care time)  Labs Reviewed - No data to display No results found.   No diagnosis found.  Pulse ox 95% room air borderline     MDM  Patient presents with one month of dizziness, dyspnea.  On exam the patient is awake alert, with borderline hypoxia, consistent with chronic lung disease. Given the absence of distress, significantly abnormal vital signs beyond borderline hypoxia, her unremarkable evaluation, there suspicion for COPD is contributory, though the patient's overall state, with no ongoing medical care suggestive of failure to thrive as well. The patient was prepared  for discharge with outpatient management.  She provided outpatient resources to obtain a new primary care physician, started on a course of steroids.         Gerhard Munch, MD 08/16/12 1301

## 2012-08-16 NOTE — ED Notes (Signed)
Pt states she self caths and prefers to obtain own urine-up to BR for UA

## 2012-09-08 ENCOUNTER — Emergency Department (INDEPENDENT_AMBULATORY_CARE_PROVIDER_SITE_OTHER)
Admission: EM | Admit: 2012-09-08 | Discharge: 2012-09-08 | Disposition: A | Discharge status: Home / Self Care | Payer: Self-pay | Source: Home / Self Care | Attending: Emergency Medicine | Admitting: Emergency Medicine

## 2012-09-08 ENCOUNTER — Encounter (HOSPITAL_COMMUNITY): Payer: Self-pay | Admitting: Emergency Medicine

## 2012-09-08 ENCOUNTER — Emergency Department (INDEPENDENT_AMBULATORY_CARE_PROVIDER_SITE_OTHER): Payer: Self-pay

## 2012-09-08 DIAGNOSIS — R0602 Shortness of breath: Secondary | ICD-10-CM

## 2012-09-08 DIAGNOSIS — J441 Chronic obstructive pulmonary disease with (acute) exacerbation: Secondary | ICD-10-CM

## 2012-09-08 LAB — POCT I-STAT, CHEM 8
BUN: 9 mg/dL (ref 6–23)
Calcium, Ion: 1.2 mmol/L (ref 1.12–1.23)
Chloride: 95 mEq/L — ABNORMAL LOW (ref 96–112)
Creatinine, Ser: 0.7 mg/dL (ref 0.50–1.10)
Glucose, Bld: 72 mg/dL (ref 70–99)
HCT: 40 % (ref 36.0–46.0)
Hemoglobin: 13.6 g/dL (ref 12.0–15.0)
Potassium: 3.2 mEq/L — ABNORMAL LOW (ref 3.5–5.1)
Sodium: 132 mEq/L — ABNORMAL LOW (ref 135–145)
TCO2: 29 mmol/L (ref 0–100)

## 2012-09-08 MED ORDER — ALBUTEROL SULFATE (5 MG/ML) 0.5% IN NEBU
2.5000 mg | INHALATION_SOLUTION | RESPIRATORY_TRACT | Status: DC
  Administered 2012-09-08: 2.5 mg via RESPIRATORY_TRACT

## 2012-09-08 MED ORDER — ALBUTEROL SULFATE HFA 108 (90 BASE) MCG/ACT IN AERS: 2.0000 | INHALATION_SPRAY | RESPIRATORY_TRACT | Status: DC | PRN

## 2012-09-08 MED ORDER — ALBUTEROL SULFATE (5 MG/ML) 0.5% IN NEBU
INHALATION_SOLUTION | RESPIRATORY_TRACT | Status: AC
  Filled 2012-09-08: qty 1

## 2012-09-08 MED ORDER — PREDNISONE 50 MG PO TABS: ORAL_TABLET | ORAL | Status: DC

## 2012-09-08 MED ORDER — IPRATROPIUM BROMIDE 0.02 % IN SOLN
0.5000 mg | RESPIRATORY_TRACT | Status: DC
  Administered 2012-09-08: 0.5 mg via RESPIRATORY_TRACT

## 2012-09-08 NOTE — ED Notes (Signed)
Pt c/o sob x 1 month. Denies chest pain and cough.  Pt has a hx of asthma. Pt was taking a dose of prednisone that helped but once finished symptoms returned.

## 2012-09-08 NOTE — ED Provider Notes (Signed)
Medical screening examination/treatment/procedure(s) were performed by non-physician practitioner and as supervising physician I was immediately available for consultation/collaboration.  Leslee Home, M.D.  Reuben Likes, MD 09/08/12 2029

## 2012-09-08 NOTE — ED Provider Notes (Signed)
History     CSN: [id number]  Arrival date & time 09/08/12  1645   First MD Initiated Contact with Patient 09/08/12 1819      Chief Complaint  Patient presents with  . Shortness of Breath    x 1 month. denies chest pain. and any other symptoms.     (Consider location/radiation/quality/duration/timing/severity/associated sxs/prior treatment) HPI Comments: Patient presents complaining of increasing shortness of breath. She was seen for the same thing a few weeks ago and she states she started to get better with the prednisone but when it was finished she started getting SOB again.  She is short of breath at rest but has increasing shortness of breath with any exercise. She does admit to a history of peptic ulcer disease denies any abdominal pain or dark stools. Overall, this has been going on for about 3 months, getting continually worse. She denies cough, chest pain, nausea, vomiting, diaphoresis, or history of similar symptoms prior to 3 months ago.     Past Medical History  Diagnosis Date  . Hypertension   . Asthma   . Peptic ulcer   . Depression   . Bladder disease     Past Surgical History  Procedure Laterality Date  . Abdominal hysterectomy      History reviewed. No pertinent family history.  History  Substance Use Topics  . Smoking status: Former Games developer  . Smokeless tobacco: Not on file  . Alcohol Use: No    OB History   Grav Para Term Preterm Abortions TAB SAB Ect Mult Living                  Review of Systems  Constitutional: Positive for fatigue. Negative for fever, chills, diaphoresis and unexpected weight change.  Eyes: Negative for visual disturbance.  Respiratory: Positive for chest tightness and shortness of breath. Negative for cough.   Cardiovascular: Negative for chest pain, palpitations and leg swelling.  Gastrointestinal: Negative for nausea, vomiting and abdominal pain.  Endocrine: Negative for polydipsia and polyuria.  Genitourinary: Negative  for dysuria, urgency and frequency.  Musculoskeletal: Negative for myalgias and arthralgias.  Skin: Negative for rash.  Neurological: Negative for dizziness, weakness and light-headedness.    Allergies  Review of patient's allergies indicates no known allergies.  Home Medications   Current Outpatient Rx  Name  Route  Sig  Dispense  Refill  . Gabapentin (NEURONTIN PO)   Oral   Take by mouth.         Marland Kitchen lisinopril-hydrochlorothiazide (PRINZIDE,ZESTORETIC) 10-12.5 MG per tablet   Oral   Take 1 tablet by mouth daily.   30 tablet   2   . predniSONE (DELTASONE) 20 MG tablet   Oral   Take 3 tablets (60 mg total) by mouth daily.   12 tablet   0   . Sertraline HCl (ZOLOFT PO)   Oral   Take by mouth.         Marland Kitchen albuterol (PROVENTIL HFA;VENTOLIN HFA) 108 (90 BASE) MCG/ACT inhaler   Inhalation   Inhale 2 puffs into the lungs every 4 (four) hours as needed for wheezing.   1 Inhaler   1   . predniSONE (DELTASONE) 50 MG tablet      1 tab PO QD   5 tablet   0   . PRESCRIPTION MEDICATION   Oral   Take 1 tablet by mouth daily. Metoprolol           BP 146/87  Pulse 64  Temp(Src) 98  F (36.7 C)  Resp 18  SpO2 99%  Physical Exam  Nursing note and vitals reviewed. Constitutional: She is oriented to person, place, and time. Vital signs are normal. She appears well-developed and well-nourished. No distress.  HENT:  Head: Atraumatic.  Eyes: EOM are normal. Pupils are equal, round, and reactive to light.  Cardiovascular: Normal rate, regular rhythm and normal heart sounds.  Exam reveals no gallop and no friction rub.   No murmur heard. Distant heart sounds  Pulmonary/Chest: Effort normal. No accessory muscle usage. Not tachypneic. No respiratory distress. She has decreased breath sounds (globally decreased). She has no wheezes. She has no rhonchi. She has no rales.  Abdominal: Soft. There is no tenderness.  Neurological: She is alert and oriented to person, place, and  time. She has normal strength.  Skin: Skin is warm and dry. She is not diaphoretic.  Psychiatric: She has a normal mood and affect. Her behavior is normal. Judgment normal.    ED Course  Procedures (including critical care time)  Labs Reviewed  POCT I-STAT, CHEM 8 - Abnormal; Notable for the following:    Sodium 132 (*)    Potassium 3.2 (*)    Chloride 95 (*)    All other components within normal limits   Dg Chest 2 View  09/08/2012   *RADIOLOGY REPORT*  Clinical Data: Shortness of breath.  CHEST - 2 VIEW  Comparison: 08/16/2012  Findings: Pulmonary hyperinflation again seen, consistent with COPD.  Both lungs are clear.  No evidence of pleural effusion. Heart size and mediastinal contours are normal.  IMPRESSION: COPD.  No active disease.   Original Report Authenticated By: Myles Rosenthal, M.D.     1. COPD exacerbation   2. Shortness of breath       MDM  Patient does not have a formal diagnosis of COPD in all respects this appears to be a COPD exacerbation. She has x-ray evidence of hyperinflation and flattening of the diaphragms as well. We'll treat this as a mild COPD exacerbation and refer her to pulmonology for pulmonary function testing and workup of possible occupational lung disease.   Meds ordered this encounter  Medications  . ipratropium (ATROVENT) nebulizer solution 0.5 mg    Sig:    And  . albuterol (PROVENTIL) (5 MG/ML) 0.5% nebulizer solution 2.5 mg    Sig:   . predniSONE (DELTASONE) 50 MG tablet    Sig: 1 tab PO QD    Dispense:  5 tablet    Refill:  0  . albuterol (PROVENTIL HFA;VENTOLIN HFA) 108 (90 BASE) MCG/ACT inhaler    Sig: Inhale 2 puffs into the lungs every 4 (four) hours as needed for wheezing.    Dispense:  1 Inhaler    Refill:  1           Graylon Good, PA-C 09/08/12 1938

## 2012-11-23 ENCOUNTER — Other Ambulatory Visit (HOSPITAL_COMMUNITY): Payer: Self-pay | Admitting: Physician Assistant

## 2012-11-23 DIAGNOSIS — Z139 Encounter for screening, unspecified: Secondary | ICD-10-CM

## 2012-11-30 ENCOUNTER — Ambulatory Visit (HOSPITAL_COMMUNITY)
Admission: RE | Admit: 2012-11-30 | Discharge: 2012-11-30 | Disposition: A | Discharge status: Home / Self Care | Payer: Self-pay | Source: Ambulatory Visit | Attending: Physician Assistant | Admitting: Physician Assistant

## 2012-11-30 DIAGNOSIS — Z139 Encounter for screening, unspecified: Secondary | ICD-10-CM

## 2013-02-01 ENCOUNTER — Other Ambulatory Visit: Payer: Self-pay

## 2013-05-11 ENCOUNTER — Emergency Department (HOSPITAL_COMMUNITY)
Admission: EM | Admit: 2013-05-11 | Discharge: 2013-05-11 | Disposition: A | Discharge status: Home / Self Care | Payer: Self-pay | Attending: Emergency Medicine | Admitting: Emergency Medicine

## 2013-05-11 ENCOUNTER — Encounter (HOSPITAL_COMMUNITY): Payer: Self-pay | Admitting: Emergency Medicine

## 2013-05-11 ENCOUNTER — Emergency Department (HOSPITAL_COMMUNITY): Payer: Self-pay

## 2013-05-11 ENCOUNTER — Other Ambulatory Visit: Payer: Self-pay

## 2013-05-11 DIAGNOSIS — F329 Major depressive disorder, single episode, unspecified: Secondary | ICD-10-CM | POA: Insufficient documentation

## 2013-05-11 DIAGNOSIS — R5383 Other fatigue: Principal | ICD-10-CM

## 2013-05-11 DIAGNOSIS — R531 Weakness: Secondary | ICD-10-CM

## 2013-05-11 DIAGNOSIS — Z87448 Personal history of other diseases of urinary system: Secondary | ICD-10-CM | POA: Insufficient documentation

## 2013-05-11 DIAGNOSIS — Z8711 Personal history of peptic ulcer disease: Secondary | ICD-10-CM | POA: Insufficient documentation

## 2013-05-11 DIAGNOSIS — J4489 Other specified chronic obstructive pulmonary disease: Secondary | ICD-10-CM | POA: Insufficient documentation

## 2013-05-11 DIAGNOSIS — J449 Chronic obstructive pulmonary disease, unspecified: Secondary | ICD-10-CM | POA: Insufficient documentation

## 2013-05-11 DIAGNOSIS — R002 Palpitations: Secondary | ICD-10-CM | POA: Insufficient documentation

## 2013-05-11 DIAGNOSIS — F3289 Other specified depressive episodes: Secondary | ICD-10-CM | POA: Insufficient documentation

## 2013-05-11 DIAGNOSIS — Z79899 Other long term (current) drug therapy: Secondary | ICD-10-CM | POA: Insufficient documentation

## 2013-05-11 DIAGNOSIS — R42 Dizziness and giddiness: Secondary | ICD-10-CM | POA: Insufficient documentation

## 2013-05-11 DIAGNOSIS — IMO0002 Reserved for concepts with insufficient information to code with codable children: Secondary | ICD-10-CM | POA: Insufficient documentation

## 2013-05-11 DIAGNOSIS — R5381 Other malaise: Secondary | ICD-10-CM | POA: Insufficient documentation

## 2013-05-11 HISTORY — DX: Chronic obstructive pulmonary disease, unspecified: J44.9

## 2013-05-11 LAB — COMPREHENSIVE METABOLIC PANEL
ALT: 19 U/L (ref 0–35)
AST: 27 U/L (ref 0–37)
Albumin: 4.2 g/dL (ref 3.5–5.2)
Alkaline Phosphatase: 96 U/L (ref 39–117)
BUN: 6 mg/dL (ref 6–23)
CO2: 25 mEq/L (ref 19–32)
Calcium: 9.2 mg/dL (ref 8.4–10.5)
Chloride: 94 mEq/L — ABNORMAL LOW (ref 96–112)
Creatinine, Ser: 0.65 mg/dL (ref 0.50–1.10)
GFR calc Af Amer: >90 mL/min (ref >90)
GFR calc non Af Amer: >90 mL/min (ref >90)
Glucose, Bld: 102 mg/dL — ABNORMAL HIGH (ref 70–99)
Potassium: 3.2 mEq/L — ABNORMAL LOW (ref 3.7–5.3)
Sodium: 134 mEq/L — ABNORMAL LOW (ref 137–147)
Total Bilirubin: 0.3 mg/dL (ref 0.3–1.2)
Total Protein: 7.2 g/dL (ref 6.0–8.3)

## 2013-05-11 LAB — CBC WITH DIFFERENTIAL/PLATELET
Basophils Absolute: 0.1 10*3/uL (ref 0.0–0.1)
Basophils Relative: 1 % (ref 0–1)
Eosinophils Absolute: 0 10*3/uL (ref 0.0–0.7)
Eosinophils Relative: 0 % (ref 0–5)
HCT: 37 % (ref 36.0–46.0)
Hemoglobin: 12.8 g/dL (ref 12.0–15.0)
Lymphocytes Relative: 36 % (ref 12–46)
Lymphs Abs: 3.1 10*3/uL (ref 0.7–4.0)
MCH: 30.3 pg (ref 26.0–34.0)
MCHC: 34.6 g/dL (ref 30.0–36.0)
MCV: 87.7 fL (ref 78.0–100.0)
Monocytes Absolute: 0.6 10*3/uL (ref 0.1–1.0)
Monocytes Relative: 7 % (ref 3–12)
Neutro Abs: 4.9 10*3/uL (ref 1.7–7.7)
Neutrophils Relative %: 56 % (ref 43–77)
Platelets: 437 10*3/uL — ABNORMAL HIGH (ref 150–400)
RBC: 4.22 MIL/uL (ref 3.87–5.11)
RDW: 13.5 % (ref 11.5–15.5)
WBC: 8.8 10*3/uL (ref 4.0–10.5)

## 2013-05-11 LAB — TROPONIN I: Troponin I: <0.3 ng/mL (ref <0.30)

## 2013-05-11 LAB — PRO B NATRIURETIC PEPTIDE: Pro B Natriuretic peptide (BNP): 174.3 pg/mL — ABNORMAL HIGH (ref 0–125)

## 2013-05-11 MED ORDER — POTASSIUM CHLORIDE CRYS ER 20 MEQ PO TBCR: 40.0000 meq | EXTENDED_RELEASE_TABLET | Freq: Once | ORAL | Status: DC

## 2013-05-11 NOTE — ED Provider Notes (Signed)
CSN: [id number]     Arrival date & time 05/11/13  1903 History   First MD Initiated Contact with Patient 05/11/13 1918     Chief Complaint  Patient presents with  . Hypertension     (Consider location/radiation/quality/duration/timing/severity/associated sxs/prior Treatment) HPI Comments: Patient comes to the ER because she thinks she is having problems with her blood pressure. Patient reports that for the last 3 days she has noticed dizziness, lightheadedness, racing heart rate and palpitations when she gets up and walks. She does have a history of COPD, but reports she has not had any significant worsening of her breathing or coughing. She has had slight bifrontal headache. No neck pain or stiffness. She has not had fever. Patient is treated for hypertension, has not had any changes in her medications. She was, however, put on trazodone 3 weeks ago to help her sleep. When these symptoms began she stopped taking the medicine, but it has not helped.  Patient is a 58 y.o. female presenting with hypertension.  Hypertension Associated symptoms include headaches.    Past Medical History  Diagnosis Date  . Hypertension   . Asthma   . Peptic ulcer   . Depression   . Bladder disease   . COPD (chronic obstructive pulmonary disease)    Past Surgical History  Procedure Laterality Date  . Abdominal hysterectomy     History reviewed. No pertinent family history. History  Substance Use Topics  . Smoking status: Former Research scientist (life sciences)  . Smokeless tobacco: Not on file  . Alcohol Use: No   OB History   Grav Para Term Preterm Abortions TAB SAB Ect Mult Living                 Review of Systems  Constitutional: Positive for fatigue.  Cardiovascular: Positive for palpitations.  Neurological: Positive for dizziness, light-headedness and headaches.  All other systems reviewed and are negative.      Allergies  Review of patient's allergies indicates no known allergies.  Home Medications    Current Outpatient Rx  Name  Route  Sig  Dispense  Refill  . albuterol (PROVENTIL HFA;VENTOLIN HFA) 108 (90 BASE) MCG/ACT inhaler   Inhalation   Inhale 2 puffs into the lungs every 4 (four) hours as needed for wheezing.   1 Inhaler   1   . FLUoxetine (PROZAC) 10 MG capsule   Oral   Take 10 mg by mouth 2 (two) times daily.         Marland Kitchen gabapentin (NEURONTIN) 300 MG capsule   Oral   Take 300 mg by mouth at bedtime.         Marland Kitchen ipratropium-albuterol (DUONEB) 0.5-2.5 (3) MG/3ML SOLN   Nebulization   Take 3 mLs by nebulization every 6 (six) hours as needed. Shortness of breath/wheezing         . lisinopril-hydrochlorothiazide (PRINZIDE,ZESTORETIC) 10-12.5 MG per tablet   Oral   Take 1 tablet by mouth daily.   30 tablet   2   . mometasone-formoterol (DULERA) 200-5 MCG/ACT AERO   Inhalation   Inhale 2 puffs into the lungs 2 (two) times daily.         . simvastatin (ZOCOR) 20 MG tablet   Oral   Take 20 mg by mouth at bedtime.         . traZODone (DESYREL) 50 MG tablet   Oral   Take 50 mg by mouth at bedtime.          BP 159/87  Pulse  84  Temp(Src) 97.9 F (36.6 C) (Oral)  Resp 17  Ht 4\' 11"  (1.499 m)  Wt 140 lb (63.504 kg)  BMI 28.26 kg/m2  SpO2 98% Physical Exam  Constitutional: She is oriented to person, place, and time. She appears well-developed and well-nourished. No distress.  HENT:  Head: Normocephalic and atraumatic.  Right Ear: Hearing normal.  Left Ear: Hearing normal.  Nose: Nose normal.  Mouth/Throat: Oropharynx is clear and moist and mucous membranes are normal.  Eyes: Conjunctivae and EOM are normal. Pupils are equal, round, and reactive to light.  Neck: Normal range of motion. Neck supple.  Cardiovascular: Regular rhythm, S1 normal and S2 normal.  Exam reveals no gallop and no friction rub.   No murmur heard. Pulmonary/Chest: Effort normal and breath sounds normal. No respiratory distress. She exhibits no tenderness.  Abdominal: Soft.  Normal appearance and bowel sounds are normal. There is no hepatosplenomegaly. There is no tenderness. There is no rebound, no guarding, no tenderness at McBurney's point and negative Murphy's sign. No hernia.  Musculoskeletal: Normal range of motion.  Neurological: She is alert and oriented to person, place, and time. She has normal strength. No cranial nerve deficit or sensory deficit. Coordination normal. GCS eye subscore is 4. GCS verbal subscore is 5. GCS motor subscore is 6.  Skin: Skin is warm, dry and intact. No rash noted. No cyanosis.  Psychiatric: She has a normal mood and affect. Her speech is normal and behavior is normal. Thought content normal.    ED Course  Procedures (including critical care time) Labs Review Labs Reviewed - No data to display Imaging Review No results found.  EKG Interpretation   None      Date: 05/11/2013  Rate: 67  Rhythm: normal sinus rhythm  QRS Axis: normal  Intervals: normal  ST/T Wave abnormalities: nonspecific ST/T changes  Conduction Disutrbances:none  Narrative Interpretation:   Old EKG Reviewed: unchanged    MDM   Diagnosis: Weakness, palpitations  Patient comes to the ER for evaluation of concern over her blood pressure. She is noticing her blood pressure is slightly elevated over last 3 days. She is also, however, noticed that she feels weak, dizzy and like her heart is pounding when she gets up and moves around. There was no associated chest pain. She has history of COPD, has not had any significant increasing cough and there is no fever. Her workup today has been entirely unremarkable. Head CT did not show any abnormalities. Chest x-ray was clear, no pneumonia. Blood work showed only slight hypokalemia, otherwise no abnormalities. Cardiac workup normal. Blood pressure slightly elevated, but not to any extent to be symptomatic.    Orpah Greek, MD 05/11/13 2151

## 2013-05-11 NOTE — Discharge Instructions (Signed)
Dizziness  Dizziness means you feel unsteady or lightheaded. You might feel like you are going to pass out (faint). HOME CARE   Drink enough fluids to keep your pee (urine) clear or pale yellow.  Take your medicines exactly as told by your doctor. If you take blood pressure medicine, always stand up slowly from the lying or sitting position. Hold on to something to steady yourself.  If you need to stand in one place for a long time, move your legs often. Tighten and relax your leg muscles.  Have someone stay with you until you feel okay.  Do not drive or use heavy machinery if you feel dizzy.  Do not drink alcohol. GET HELP RIGHT AWAY IF:   You feel dizzy or lightheaded and it gets worse.  You feel sick to your stomach (nauseous), or you throw up (vomit).  You have trouble talking or walking.  You feel weak or have trouble using your arms, hands, or legs.  You cannot think clearly or have trouble forming sentences.  You have chest pain, belly (abdominal) pain, sweating, or you are short of breath.  Your vision changes.  You are bleeding.  You have problems from your medicine that seem to be getting worse. MAKE SURE YOU:   Understand these instructions.  Will watch your condition.  Will get help right away if you are not doing well or get worse. Document Released: 03/04/2011 Document Revised: 06/07/2011 Document Reviewed: 03/04/2011 [facility] Patient Information 2014 [place], [place].  Palpitations  A palpitation is the feeling that your heartbeat is irregular. It may feel like your heart is fluttering or skipping a beat. It may also feel like your heart is beating faster than normal. This is usually not a serious problem. In some cases, you may need more medical tests. HOME CARE  Avoid:  Caffeine in coffee, tea, soft drinks, diet pills, and energy drinks.  Chocolate.  Alcohol.  Stop smoking if you smoke.  Reduce your stress and anxiety. Try:  A method that  measures bodily functions so you can learn to control them (biofeedback).  Yoga.  Meditation.  Physical activity such as swimming, jogging, or walking.  Get plenty of rest and sleep. GET HELP RIGHT AWAY IF:   You have chest pain.  You feel short of breath.  You have a very bad headache.  You feel dizzy or pass out (faint).  Your fast or irregular heartbeat continues after 24 hours.  Your palpitations occur more often. MAKE SURE YOU:   Understand these instructions.  Will watch your condition.  Will get help right away if you are not doing well or get worse. Document Released: 12/23/2007 Document Revised: 09/14/2011 Document Reviewed: 05/14/2011 [facility] Patient Information 2014 [place].  Weakness Weakness is a lack of strength. You may feel weak all over your body or just in one part of your body. Weakness can be serious. In some cases, you may need more medical tests. HOME Martin a well-balanced diet.  Try to exercise every day.  Only take medicines as told by your doctor. GET HELP RIGHT AWAY IF:   You cannot do your normal daily activities.  You cannot walk up and down stairs, or you feel very tired when you do so.  You have shortness of breath or chest pain.  You have trouble moving parts of your body.  You have weakness in only one body part or on only one side of the body.  You have  a fever.  You have trouble speaking or swallowing.  You cannot control when you pee (urinate) or poop (bowel movement).  You have black or bloody throw up (vomit) or poop.  Your weakness gets worse or spreads to other body parts.  You have new aches or pains. MAKE SURE YOU:   Understand these instructions.  Will watch your condition.  Will get help right away if you are not doing well or get worse. Document Released: 02/26/2008 Document Revised: 09/14/2011 Document Reviewed: 05/14/2011 [facility] Patient Information 2014 [place].

## 2013-05-11 NOTE — ED Notes (Signed)
Has been taking BP meds as directed, but BP has been elevated over the last 3 days.  Highest has been 158/96.  Has called clinic she attends today, but no one returned her call.  C/O HA, dizziness, nausea.  Denies increased Na+ intake.  Has recently been treated for bladder infection w/Cipro.

## 2014-05-29 ENCOUNTER — Emergency Department (HOSPITAL_COMMUNITY)
Admission: EM | Admit: 2014-05-29 | Discharge: 2014-05-29 | Disposition: A | Discharge status: Home / Self Care | Payer: BLUE CROSS/BLUE SHIELD | Source: Home / Self Care | Attending: Emergency Medicine | Admitting: Emergency Medicine

## 2014-05-29 ENCOUNTER — Encounter (HOSPITAL_COMMUNITY): Payer: Self-pay | Admitting: *Deleted

## 2014-05-29 DIAGNOSIS — K0401 Reversible pulpitis: Secondary | ICD-10-CM

## 2014-05-29 DIAGNOSIS — K040 Pulpitis: Secondary | ICD-10-CM

## 2014-05-29 DIAGNOSIS — J449 Chronic obstructive pulmonary disease, unspecified: Secondary | ICD-10-CM

## 2014-05-29 MED ORDER — METRONIDAZOLE 500 MG PO TABS: 500.0000 mg | ORAL_TABLET | Freq: Two times a day (BID) | ORAL | Status: DC

## 2014-05-29 MED ORDER — BUDESONIDE 0.5 MG/2ML IN SUSP: 0.5000 mg | Freq: Two times a day (BID) | RESPIRATORY_TRACT | Status: DC

## 2014-05-29 MED ORDER — NAPROXEN 500 MG PO TABS: 500.0000 mg | ORAL_TABLET | Freq: Two times a day (BID) | ORAL | Status: DC

## 2014-05-29 MED ORDER — HYDROCODONE-ACETAMINOPHEN 5-325 MG PO TABS
ORAL_TABLET | ORAL | Status: DC
Start: 2014-05-29 — End: 2015-08-17

## 2014-05-29 MED ORDER — AMOXICILLIN 500 MG PO CAPS: 500.0000 mg | ORAL_CAPSULE | Freq: Three times a day (TID) | ORAL | Status: DC

## 2014-05-29 NOTE — Discharge Instructions (Signed)
Look up the [place] [facility] for free dental clinics. undoomedical.com.asp  Get there early and be prepared to wait. [facility] and [employer] have [employer] advertising schools that provide low cost routine dental care.   Other resources: [facility] [place], [place] [phone]  Patients with Medicaid: [place] [address] Phone:  [phone]                                                  Phone:  [phone]  Dr. Ardyth Harps [address] [phone]  If unable to pay or uninsured, contact:  [place] or [facility] to become qualified for the adult dental clinic.  No matter what dental problem you have, it will not get better unless you get good dental care.  If the tooth is not taken care of, your symptoms will come back in time and you will be visiting [place] in the [place] with a bad toothache.  So, see your dentist as soon as possible.  If you don't have a dentist, we can give you a list of dentists.  Sometimes the most cost effective treatment is removal of the tooth.  This can be done very inexpensively through one of the low cost Dance movement psychotherapist such as the facility on [facility] in [place] [phone]).  The downside to this is that you will have one less tooth and this can effect your ability to chew.  Some other things that can be done for a dental infection include the following:   Rinse your mouth out with hot salt water (1/2 tsp of table salt and a pinch of baking soda in 8 oz of hot water).  You can do this every 2 or 3 hours.  Avoid cold foods, beverages, and cold air.  This will make your symptoms worse.  Sleep with your head elevated.  Sleeping flat will cause your gums and oral tissues to swell and make them hurt  more.  You can sleep on several pillows.  Even better is to sleep in a recliner with your head higher than your heart.  For mild to moderate pain, you can take Tylenol, ibuprofen, or Aleve.  External application of heat by a heating pad, hot water bottle, or hot wet towel can help with pain and speed healing.  You can do this every 2 to 3 hours. Do not fall asleep on a heating pad since this can cause a burn.     Chronic Obstructive Pulmonary Disease Chronic obstructive pulmonary disease (COPD) is a common lung condition in which airflow from the lungs is limited. COPD is a general term that can be used to describe many different lung problems that limit airflow, including both chronic bronchitis and emphysema. If you have COPD, your lung function will probably never return to normal, but there are measures you can take to improve lung function and make yourself feel better.  CAUSES  Smoking (common).   Exposure to secondhand smoke.   Genetic problems.  Chronic inflammatory lung diseases or recurrent infections. SYMPTOMS   Shortness of breath, especially with physical activity.   Deep, persistent (chronic) cough with a large amount of thick mucus.   Wheezing.   Rapid breaths (tachypnea).   Gray or bluish discoloration (cyanosis) of the skin, especially in fingers, toes, or lips.   Fatigue.   Weight loss.   Frequent infections or episodes when breathing symptoms become much worse (exacerbations).   Chest tightness. DIAGNOSIS  Your health care provider will take a medical history and perform a physical examination to make the initial diagnosis. Additional tests for COPD may include:   Lung (pulmonary) function tests.  Chest X-ray.  CT scan.  Blood tests. TREATMENT  Treatment available to help you feel better when you have COPD includes:   Inhaler and nebulizer medicines. These help manage the symptoms of COPD and make your breathing more  comfortable.  Supplemental oxygen. Supplemental oxygen is only helpful if you have a low oxygen level in your blood.   Exercise and physical activity. These are beneficial for nearly all people with COPD. Some people may also benefit from a pulmonary rehabilitation program. HOME CARE INSTRUCTIONS   Take all medicines (inhaled or pills) as directed by your health care provider.  Avoid over-the-counter medicines or cough syrups that dry up your airway (such as antihistamines) and slow down the elimination of secretions unless instructed otherwise by your health care provider.   If you are a smoker, the most important thing that you can do is stop smoking. Continuing to smoke will cause further lung damage and breathing trouble. Ask your health care provider for help with quitting smoking. He or she can direct you to community resources or hospitals that provide support.  Avoid exposure to irritants such as smoke, chemicals, and fumes that aggravate your breathing.  Use oxygen therapy and pulmonary rehabilitation if directed by your health care provider. If you require home oxygen therapy, ask your health care provider whether you should purchase a pulse oximeter to measure your oxygen level at home.   Avoid contact with individuals who have a contagious illness.  Avoid extreme temperature and humidity changes.  Eat healthy foods. Eating smaller, more frequent meals and resting before meals may help you maintain your strength.  Stay active, but balance activity with periods of rest. Exercise and physical activity will help you maintain your ability to do things you want to do.  Preventing infection and hospitalization is very important when you have COPD. Make sure to receive all the vaccines your health care provider recommends, especially the pneumococcal and influenza vaccines. Ask your health care provider whether you need a pneumonia vaccine.  Learn and use relaxation techniques to  manage stress.  Learn and use controlled breathing techniques as directed by your health care provider. Controlled breathing techniques include:   Pursed lip breathing. Start by breathing in (inhaling) through your nose for 1 second. Then, purse your lips as if you were going to whistle and breathe out (exhale) through the pursed lips for 2 seconds.   Diaphragmatic breathing. Start by putting one hand on your abdomen just above your waist. Inhale slowly through your nose. The hand on your abdomen should move out. Then purse your lips and exhale slowly. You should be able to feel the hand on your abdomen moving in as you exhale.   Learn and use controlled coughing to clear mucus from your  lungs. Controlled coughing is a series of short, progressive coughs. The steps of controlled coughing are:  1. Lean your head slightly forward.  2. Breathe in deeply using diaphragmatic breathing.  3. Try to hold your breath for 3 seconds.  4. Keep your mouth slightly open while coughing twice.  5. Spit any mucus out into a tissue.  6. Rest and repeat the steps once or twice as needed. SEEK MEDICAL CARE IF:   You are coughing up more mucus than usual.   There is a change in the color or thickness of your mucus.   Your breathing is more labored than usual.   Your breathing is faster than usual.  SEEK IMMEDIATE MEDICAL CARE IF:   You have shortness of breath while you are resting.   You have shortness of breath that prevents you from:  Being able to talk.   Performing your usual physical activities.   You have chest pain lasting longer than 5 minutes.   Your skin color is more cyanotic than usual.  You measure low oxygen saturations for longer than 5 minutes with a pulse oximeter. MAKE SURE YOU:   Understand these instructions.  Will watch your condition.  Will get help right away if you are not doing well or get worse. Document Released: 12/23/2004 Document Revised:  07/30/2013 Document Reviewed: 11/09/2012 [facility] Patient Information 2015 [place], [place]. This information is not intended to replace advice given to you by your health care provider. Make sure you discuss any questions you have with your health care provider.

## 2014-05-29 NOTE — ED Notes (Signed)
Pt  Reports        Symptoms      Of  Toothache   Swelling  r  Upper      And   Lower      Side of  Mouth     X 1   Week        Pt  Is  Also requesting   A  rx  For  An  Inhaler  For  Substitution       Of    symbicort        She  Is  Sitting  Upright on the  Exam table  Speaking in  Complete  sentances  And  Is  In no  Acute  Distress

## 2014-05-29 NOTE — ED Provider Notes (Signed)
Chief Complaint   Oral Swelling   History of Present Illness   Monica Scott is a 59 year old female who has had a one-week history of dental pain in the right upper and lower dentition along with swelling. It hurts to chew but she's had no trouble swallowing or breathing. She denies any fever or chills. No headaches. Does have some pain and swelling in the neck. No chest pain.  Her other concern is COPD. She's had this for years. She gets albuterol and ipratropium by nebulizer. She was on Symbicort, but couldn't afford this expensive medication, so she stopped. She is wondering if there is a nebulizer solution for this that she can use that might be less expensive. She has some dry cough and wheezing and shortness of breath.  Review of Systems   Other than as noted above, the patient denies any of the following symptoms: Systemic:  No fever or chills. ENT:  No headache, ear ache, sore throat, nasal congestion, facial pain, or swelling. Neck:  No adenopathy or neck swelling. Lungs:  No coughing or shortness of breath.  [place]   Past medical history, family history, social history, meds, and allergies were reviewed. She takes lisinopril and fluoxetine for high blood pressure and depression.  Physical Examination     Vital signs:  BP 160/88 mmHg  Pulse 75  Temp(Src) 98.3 F (36.8 C) (Oral)  Resp 16  SpO2 96% General:  Alert, oriented, in no distress. ENT:  TMs and canals normal.  Nasal mucosa normal. Mouth exam:  She has widespread dental decay. She has multiple carious teeth. It's hard to say which one is the most infected. They all looked inflamed with gingival inflammation. There is no swelling of the floor the mouth or the tongue. The pharynx is clear. Neck:  No swelling or adenopathy. Lungs:  Breath sounds clear and equal bilaterally.  No wheezes, rales or rhonchi. Heart:  Regular rhythm.  No gallops or murmers. Skin:  Clear, warm and dry.   Assessment   The primary  encounter diagnosis was Pulpitis. A diagnosis of Chronic obstructive pulmonary disease, unspecified COPD, unspecified chronic bronchitis type was also pertinent to this visit.  No evidence of Ludwig's angina.    Plan   1.  Meds:  The following meds were prescribed:   Discharge Medication List as of 05/29/2014  8:36 AM    START taking these medications   Details  amoxicillin (AMOXIL) 500 MG capsule Take 1 capsule (500 mg total) by mouth 3 (three) times daily., Starting 05/29/2014, Until Discontinued, Normal    budesonide (PULMICORT) 0.5 MG/2ML nebulizer solution Take 2 mLs (0.5 mg total) by nebulization 2 (two) times daily., Starting 05/29/2014, Until Discontinued, Normal    HYDROcodone-acetaminophen (NORCO/VICODIN) 5-325 MG per tablet 1 to 2 tabs every 4 to 6 hours as needed for pain., Print    metroNIDAZOLE (FLAGYL) 500 MG tablet Take 1 tablet (500 mg total) by mouth 2 (two) times daily., Starting 05/29/2014, Until Discontinued, Normal    naproxen (NAPROSYN) 500 MG tablet Take 1 tablet (500 mg total) by mouth 2 (two) times daily., Starting 05/29/2014, Until Discontinued, Normal        2.  Patient Education/Counseling:  The patient was given appropriate handouts, self care instructions, and instructed in pain control. Suggested sleeping with head of bed elevated and hot salt water mouthwash.   3.  Follow up:  The patient was told to follow up if no better in 3 to 4 days, if becoming  worse in any way, and given some red flag symptoms such as difficulty swallowing or breathing which would prompt immediate return.  Follow up with a dentist as soon as posssible.     Harden Mo, MD 05/29/14 [phone]

## 2014-07-13 ENCOUNTER — Encounter (HOSPITAL_COMMUNITY): Payer: Self-pay

## 2014-07-13 ENCOUNTER — Encounter (HOSPITAL_COMMUNITY): Payer: Self-pay | Admitting: Emergency Medicine

## 2014-07-13 ENCOUNTER — Emergency Department (INDEPENDENT_AMBULATORY_CARE_PROVIDER_SITE_OTHER): Payer: BLUE CROSS/BLUE SHIELD

## 2014-07-13 ENCOUNTER — Emergency Department (HOSPITAL_COMMUNITY)
Admission: EM | Admit: 2014-07-13 | Discharge: 2014-07-13 | Disposition: A | Discharge status: Home / Self Care | Payer: BLUE CROSS/BLUE SHIELD | Attending: Emergency Medicine | Admitting: Emergency Medicine

## 2014-07-13 ENCOUNTER — Emergency Department (HOSPITAL_COMMUNITY)
Admission: EM | Admit: 2014-07-13 | Discharge: 2014-07-13 | Disposition: A | Discharge status: Other Acute Inpatient Hospital | Payer: BLUE CROSS/BLUE SHIELD | Source: Home / Self Care | Attending: Family Medicine | Admitting: Family Medicine

## 2014-07-13 DIAGNOSIS — Z8711 Personal history of peptic ulcer disease: Secondary | ICD-10-CM | POA: Insufficient documentation

## 2014-07-13 DIAGNOSIS — F329 Major depressive disorder, single episode, unspecified: Secondary | ICD-10-CM | POA: Diagnosis not present

## 2014-07-13 DIAGNOSIS — Z87891 Personal history of nicotine dependence: Secondary | ICD-10-CM | POA: Insufficient documentation

## 2014-07-13 DIAGNOSIS — J441 Chronic obstructive pulmonary disease with (acute) exacerbation: Secondary | ICD-10-CM

## 2014-07-13 DIAGNOSIS — R0602 Shortness of breath: Secondary | ICD-10-CM

## 2014-07-13 DIAGNOSIS — R0609 Other forms of dyspnea: Secondary | ICD-10-CM

## 2014-07-13 DIAGNOSIS — Z79899 Other long term (current) drug therapy: Secondary | ICD-10-CM | POA: Insufficient documentation

## 2014-07-13 DIAGNOSIS — Z7951 Long term (current) use of inhaled steroids: Secondary | ICD-10-CM | POA: Insufficient documentation

## 2014-07-13 DIAGNOSIS — I10 Essential (primary) hypertension: Secondary | ICD-10-CM | POA: Diagnosis not present

## 2014-07-13 DIAGNOSIS — Z792 Long term (current) use of antibiotics: Secondary | ICD-10-CM | POA: Diagnosis not present

## 2014-07-13 MED ORDER — ALBUTEROL SULFATE HFA 108 (90 BASE) MCG/ACT IN AERS
2.0000 | INHALATION_SPRAY | Freq: Once | RESPIRATORY_TRACT | Status: AC
  Administered 2014-07-13: 2 via RESPIRATORY_TRACT
  Filled 2014-07-13: qty 6.7

## 2014-07-13 MED ORDER — PREDNISONE 20 MG PO TABS: ORAL_TABLET | ORAL | Status: DC

## 2014-07-13 MED ORDER — AZITHROMYCIN 250 MG PO TABS: 250.0000 mg | ORAL_TABLET | Freq: Every day | ORAL | Status: DC

## 2014-07-13 MED ORDER — IPRATROPIUM-ALBUTEROL 0.5-2.5 (3) MG/3ML IN SOLN
3.0000 mL | RESPIRATORY_TRACT | Status: DC
  Administered 2014-07-13: 3 mL via RESPIRATORY_TRACT
  Filled 2014-07-13: qty 3

## 2014-07-13 NOTE — Discharge Instructions (Signed)
Chronic Obstructive Pulmonary Disease Exacerbation Chronic obstructive pulmonary disease (COPD) is a common lung condition in which airflow from the lungs is limited. COPD is a general term that can be used to describe many different lung problems that limit airflow, including chronic bronchitis and emphysema. COPD exacerbations are episodes when breathing symptoms become much worse and require extra treatment. Without treatment, COPD exacerbations can be life threatening, and frequent COPD exacerbations can cause further damage to your lungs. CAUSES   Respiratory infections.   Exposure to smoke.   Exposure to air pollution, chemical fumes, or dust. Sometimes there is no apparent cause or trigger. RISK FACTORS  Smoking cigarettes.  Older age.  Frequent prior COPD exacerbations. SIGNS AND SYMPTOMS   Increased coughing.   Increased thick spit (sputum) production.   Increased wheezing.   Increased shortness of breath.   Rapid breathing.   Chest tightness. DIAGNOSIS  Your medical history, a physical exam, and tests will help your health care provider make a diagnosis. Tests may include:  A chest X-ray.  Basic lab tests.  Sputum testing.  An arterial blood gas test. TREATMENT  Depending on the severity of your COPD exacerbation, you may need to be admitted to a hospital for treatment. Some of the treatments commonly used to treat COPD exacerbations are:   Antibiotic medicines.   Bronchodilators. These are drugs that expand the air passages. They may be given with an inhaler or nebulizer. Spacer devices may be needed to help improve drug delivery.  Corticosteroid medicines.  Supplemental oxygen therapy.  HOME CARE INSTRUCTIONS   Do not smoke. Quitting smoking is very important to prevent COPD from getting worse and exacerbations from happening as often.  Avoid exposure to all substances that irritate the airway, especially to tobacco smoke.   If you were  prescribed an antibiotic medicine, finish it all even if you start to feel better.  Take all medicines as directed by your health care provider.It is important to use correct technique with inhaled medicines.  Drink enough fluids to keep your urine clear or pale yellow (unless you have a medical condition that requires fluid restriction).  Use a cool mist vaporizer. This makes it easier to clear your chest when you cough.   If you have a home nebulizer and oxygen, continue to use them as directed.   Maintain all necessary vaccinations to prevent infections.   Exercise regularly.   Eat a healthy diet.   Keep all follow-up appointments as directed by your health care provider. SEEK IMMEDIATE MEDICAL CARE IF:  You have worsening shortness of breath.   You have trouble talking.   You have severe chest pain.  You have blood in your sputum.  You have a fever.  You have weakness, vomit repeatedly, or faint.   You feel confused.   You continue to get worse. MAKE SURE YOU:   Understand these instructions.  Will watch your condition.  Will get help right away if you are not doing well or get worse. Document Released: 01/10/2007 Document Revised: 07/30/2013 Document Reviewed: 11/17/2012 [facility] Patient Information 2015 [place], [place]. This information is not intended to replace advice given to you by your health care provider. Make sure you discuss any questions you have with your health care provider.  Please take your medications as prescribed. You will need follow-up with your primary care for further evaluation and management of your symptoms. Return to ED for new or worsening symptoms.

## 2014-07-13 NOTE — ED Provider Notes (Signed)
CSN: [id number]     Arrival date & time 07/13/14  1749 History   First MD Initiated Contact with Patient 07/13/14 1801     Chief Complaint  Patient presents with  . Shortness of Breath     (Consider location/radiation/quality/duration/timing/severity/associated sxs/prior Treatment) HPI Monica Scott is a 59 y.o. female  history of COPD, hypertension comes in for evaluation of shortness of breath. Patient was referred here from urgent care center for evaluation of COPD exacerbation. Patient states that for the past 4 or 5 days she has had a cold, that is improving, but has caused her to become more short of breath. She reports originally having hot flashes and chills, cough with productive green phlegm, but these symptoms have resolved. She has not tried anything to improve her symptoms. Reports only subjective mild shortness of breath now. Denies any headache, rhinorrhea, chest pain, leg swelling, nausea or vomiting, PND.  Past Medical History  Diagnosis Date  . Hypertension   . Asthma   . Peptic ulcer   . Depression   . Bladder disease   . COPD (chronic obstructive pulmonary disease)    Past Surgical History  Procedure Laterality Date  . Abdominal hysterectomy     No family history on file. History  Substance Use Topics  . Smoking status: Former Research scientist (life sciences)  . Smokeless tobacco: Not on file  . Alcohol Use: No   OB History    No data available     Review of Systems A 10 point review of systems was completed and was negative except for pertinent positives and negatives as mentioned in the history of present illness     Allergies  Review of patient's allergies indicates no known allergies.  Home Medications   Prior to Admission medications   Medication Sig Start Date End Date Taking? Authorizing Provider  albuterol (PROVENTIL HFA;VENTOLIN HFA) 108 (90 BASE) MCG/ACT inhaler Inhale 2 puffs into the lungs every 4 (four) hours as needed for wheezing. 09/08/12   Liam Graham, PA-C  amoxicillin (AMOXIL) 500 MG capsule Take 1 capsule (500 mg total) by mouth 3 (three) times daily. 05/29/14   Harden Mo, MD  azithromycin (ZITHROMAX) 250 MG tablet Take 1 tablet (250 mg total) by mouth daily. Take first 2 tablets together, then 1 every day until finished. 07/13/14   Comer Locket, PA-C  budesonide (PULMICORT) 0.5 MG/2ML nebulizer solution Take 2 mLs (0.5 mg total) by nebulization 2 (two) times daily. 05/29/14   Harden Mo, MD  FLUoxetine (PROZAC) 10 MG capsule Take 10 mg by mouth 2 (two) times daily.    Historical Provider, MD  gabapentin (NEURONTIN) 300 MG capsule Take 300 mg by mouth at bedtime.    Historical Provider, MD  HYDROcodone-acetaminophen (NORCO/VICODIN) 5-325 MG per tablet 1 to 2 tabs every 4 to 6 hours as needed for pain. 05/29/14   Harden Mo, MD  ipratropium-albuterol (DUONEB) 0.5-2.5 (3) MG/3ML SOLN Take 3 mLs by nebulization every 6 (six) hours as needed. Shortness of breath/wheezing    Historical Provider, MD  lisinopril-hydrochlorothiazide (PRINZIDE,ZESTORETIC) 10-12.5 MG per tablet Take 1 tablet by mouth daily. 09/06/11   Ignacia Felling, PA-C  metroNIDAZOLE (FLAGYL) 500 MG tablet Take 1 tablet (500 mg total) by mouth 2 (two) times daily. 05/29/14   Harden Mo, MD  mometasone-formoterol [facility]) 200-5 MCG/ACT AERO Inhale 2 puffs into the lungs 2 (two) times daily.    Historical Provider, MD  naproxen (NAPROSYN) 500 MG tablet Take 1 tablet (500  mg total) by mouth 2 (two) times daily. 05/29/14   Harden Mo, MD  predniSONE (DELTASONE) 20 MG tablet 3 tabs po day one, then 2 tabs daily x 4 days 07/13/14   Comer Locket, PA-C  simvastatin (ZOCOR) 20 MG tablet Take 20 mg by mouth at bedtime.    Historical Provider, MD  traZODone (DESYREL) 50 MG tablet Take 50 mg by mouth at bedtime.    Historical Provider, MD   BP 131/86 mmHg  Pulse 63  Temp(Src) 98 F (36.7 C) (Oral)  Resp 14  Ht 4\' 11"  (1.499 m)  Wt 131 lb 1 oz (59.45 kg)  BMI 26.46  kg/m2  SpO2 99% Physical Exam  Constitutional: She is oriented to person, place, and time. She appears well-developed and well-nourished.  HENT:  Head: Normocephalic and atraumatic.  Mouth/Throat: Oropharynx is clear and moist.  Eyes: Conjunctivae are normal. Pupils are equal, round, and reactive to light. Right eye exhibits no discharge. Left eye exhibits no discharge. No scleral icterus.  Neck: Normal range of motion. Neck supple. No JVD present.  Cardiovascular: Normal rate, regular rhythm and normal heart sounds.   Pulmonary/Chest: Effort normal and breath sounds normal. No respiratory distress. She has no wheezes. She has no rales.  Diminished breath sounds  Abdominal: Soft. There is no tenderness.  Musculoskeletal: Normal range of motion. She exhibits no edema or tenderness.  Neurological: She is alert and oriented to person, place, and time.  Cranial Nerves II-XII grossly intact  Skin: Skin is warm and dry. No rash noted.  Psychiatric: She has a normal mood and affect.  Nursing note and vitals reviewed.   ED Course  Procedures (including critical care time) Labs Review Labs Reviewed - No data to display  Imaging Review Dg Chest 2 View  07/13/2014   CLINICAL DATA:  59 year old female with a history of shortness of breath  EXAM: CHEST - 2 VIEW  COMPARISON:  05/11/2013  FINDINGS: Cardiomediastinal silhouette unchanged in size and contour. No evidence of pulmonary vascular congestion.  Ill-defined opacity the bilateral lung bases favor represent overlying soft tissues of the chest wall.  Lateral view demonstrates no evidence of airspace the knees. No confluent airspace disease of the upper lungs.  No pleural effusion or pneumothorax.  Mild coarsening of interstitial markings similar to comparison.  No displaced fracture. Mild degenerative changes of the shoulders. Mild scoliotic curvature of the thoracic spine.  Unremarkable appearance of the upper abdomen.  IMPRESSION: No radiographic  evidence of acute cardiopulmonary disease.  Signed,  Dulcy Fanny. Earleen Newport, DO  [facility]  [facility]   Electronically Signed   By: Corrie Mckusick D.O.   On: 07/13/2014 16:51     EKG Interpretation   Date/Time:  Saturday July 13 2014 17:55:34 EDT Ventricular Rate:  60 PR Interval:  122 QRS Duration: 84 QT Interval:  404 QTC Calculation: 404 R Axis:   83 Text Interpretation:  Normal sinus rhythm Normal ECG Confirmed by Hazle Coca [phone]) on 07/13/2014 6:16:53 PM     Meds given in ED:  Medications  albuterol (PROVENTIL HFA;VENTOLIN HFA) 108 (90 BASE) MCG/ACT inhaler 2 puff (2 puffs Inhalation Given 07/13/14 2035)    Discharge Medication List as of 07/13/2014  8:24 PM    START taking these medications   Details  azithromycin (ZITHROMAX) 250 MG tablet Take 1 tablet (250 mg total) by mouth daily. Take first 2 tablets together, then 1 every day until finished., Starting 07/13/2014, Until Discontinued,  Print    predniSONE (DELTASONE) 20 MG tablet 3 tabs po day one, then 2 tabs daily x 4 days, Print       Filed Vitals:   07/13/14 1857 07/13/14 1900 07/13/14 2000 07/13/14 2039  BP: 124/78 122/77 111/71 131/86  Pulse: 66 66 63   Temp:    98 F (36.7 C)  TempSrc:      Resp: 15 14 14 14   Height:      Weight:      SpO2: 96% 96% 97% 99%    MDM  Vitals stable - WNL -afebrile Pt resting comfortably in ED. Reports resolution of symptoms with one breathing treatment in ED. PE--benign lung exam. Grossly benign physical exam. No tachypnea or evidence of respiratory distress. Oxygen saturations 96% on room air Imaging chest x-ray shows no acute cardiopulmonary pathology   Patient states she is out of her inhaler at home. We will refill in ED. Also DC with abx and prednisone taper.I discussed all relevant lab findings and imaging results with pt and they verbalized understanding. Discussed f/u with PCP within 48 hrs and return precautions, pt  very amenable to plan. Prior to patient discharge, I discussed and reviewed this case with Dr.Rees    Final diagnoses:  Chronic obstructive pulmonary disease with acute exacerbation        Comer Locket, PA-C 07/14/14 0150  Quintella Reichert, MD 07/14/14 [phone]

## 2014-07-13 NOTE — ED Notes (Addendum)
Asked to evaluate this patient by registration clerks  C/o flare up of her COPD x 5 days, recent URI x 10 days Out of her medications and needs refills.  NAD noted at present

## 2014-07-13 NOTE — ED Provider Notes (Signed)
CSN: [id number]     Arrival date & time 07/13/14  1529 History   First MD Initiated Contact with Patient 07/13/14 1626     Chief Complaint  Patient presents with  . COPD   (Consider location/radiation/quality/duration/timing/severity/associated sxs/prior Treatment) Patient is a 59 y.o. female presenting with shortness of breath. The history is provided by the patient.  Shortness of Breath Severity:  Moderate Onset quality:  Gradual Duration:  10 days Progression:  Worsening Chronicity:  Chronic Ineffective treatments:  Inhaler Associated symptoms: cough   Associated symptoms: no fever   Risk factors comment:  H/o copd.   Past Medical History  Diagnosis Date  . Hypertension   . Asthma   . Peptic ulcer   . Depression   . Bladder disease   . COPD (chronic obstructive pulmonary disease)    Past Surgical History  Procedure Laterality Date  . Abdominal hysterectomy     History reviewed. No pertinent family history. History  Substance Use Topics  . Smoking status: Former Research scientist (life sciences)  . Smokeless tobacco: Not on file  . Alcohol Use: No   OB History    No data available     Review of Systems  Constitutional: Negative for fever.  HENT: Positive for postnasal drip and rhinorrhea.   Respiratory: Positive for cough and shortness of breath.     Allergies  Review of patient's allergies indicates no known allergies.  Home Medications   Prior to Admission medications   Medication Sig Start Date End Date Taking? Authorizing Provider  albuterol (PROVENTIL HFA;VENTOLIN HFA) 108 (90 BASE) MCG/ACT inhaler Inhale 2 puffs into the lungs every 4 (four) hours as needed for wheezing. 09/08/12   Liam Graham, PA-C  amoxicillin (AMOXIL) 500 MG capsule Take 1 capsule (500 mg total) by mouth 3 (three) times daily. 05/29/14   Harden Mo, MD  budesonide (PULMICORT) 0.5 MG/2ML nebulizer solution Take 2 mLs (0.5 mg total) by nebulization 2 (two) times daily. 05/29/14   Harden Mo, MD   FLUoxetine (PROZAC) 10 MG capsule Take 10 mg by mouth 2 (two) times daily.    Historical Provider, MD  gabapentin (NEURONTIN) 300 MG capsule Take 300 mg by mouth at bedtime.    Historical Provider, MD  HYDROcodone-acetaminophen (NORCO/VICODIN) 5-325 MG per tablet 1 to 2 tabs every 4 to 6 hours as needed for pain. 05/29/14   Harden Mo, MD  ipratropium-albuterol (DUONEB) 0.5-2.5 (3) MG/3ML SOLN Take 3 mLs by nebulization every 6 (six) hours as needed. Shortness of breath/wheezing    Historical Provider, MD  lisinopril-hydrochlorothiazide (PRINZIDE,ZESTORETIC) 10-12.5 MG per tablet Take 1 tablet by mouth daily. 09/06/11   Ignacia Felling, PA-C  metroNIDAZOLE (FLAGYL) 500 MG tablet Take 1 tablet (500 mg total) by mouth 2 (two) times daily. 05/29/14   Harden Mo, MD  mometasone-formoterol [facility]) 200-5 MCG/ACT AERO Inhale 2 puffs into the lungs 2 (two) times daily.    Historical Provider, MD  naproxen (NAPROSYN) 500 MG tablet Take 1 tablet (500 mg total) by mouth 2 (two) times daily. 05/29/14   Harden Mo, MD  simvastatin (ZOCOR) 20 MG tablet Take 20 mg by mouth at bedtime.    Historical Provider, MD  traZODone (DESYREL) 50 MG tablet Take 50 mg by mouth at bedtime.    Historical Provider, MD   BP 128/56 mmHg  Pulse 72  Temp(Src) 98.5 F (36.9 C) (Oral)  Resp 14  SpO2 97% Physical Exam  Constitutional: She is oriented to person, place, and time.  She appears well-developed and well-nourished. No distress.  HENT:  Head: Normocephalic.  Right Ear: External ear normal.  Left Ear: External ear normal.  Mouth/Throat: Oropharynx is clear and moist.  Eyes: Pupils are equal, round, and reactive to light.  Neck: Normal range of motion. Neck supple.  Cardiovascular: Normal rate, regular rhythm, normal heart sounds and intact distal pulses.   Pulmonary/Chest: No respiratory distress. She has decreased breath sounds.  Neurological: She is alert and oriented to person, place, and time.  Skin: Skin  is warm and dry.  Nursing note and vitals reviewed.   ED Course  Procedures (including critical care time) Labs Review Labs Reviewed - No data to display  Imaging Review Dg Chest 2 View  07/13/2014   CLINICAL DATA:  59 year old female with a history of shortness of breath  EXAM: CHEST - 2 VIEW  COMPARISON:  05/11/2013  FINDINGS: Cardiomediastinal silhouette unchanged in size and contour. No evidence of pulmonary vascular congestion.  Ill-defined opacity the bilateral lung bases favor represent overlying soft tissues of the chest wall.  Lateral view demonstrates no evidence of airspace the knees. No confluent airspace disease of the upper lungs.  No pleural effusion or pneumothorax.  Mild coarsening of interstitial markings similar to comparison.  No displaced fracture. Mild degenerative changes of the shoulders. Mild scoliotic curvature of the thoracic spine.  Unremarkable appearance of the upper abdomen.  IMPRESSION: No radiographic evidence of acute cardiopulmonary disease.  Signed,  Dulcy Fanny. Earleen Newport, DO  [facility]  [facility]   Electronically Signed   By: Corrie Mckusick D.O.   On: 07/13/2014 16:51     MDM   1. Dyspnea on exertion   2. SOBOE (shortness of breath on exertion)    Sent for dyspnea eval, no evidence on cxr of acute process.    Billy Fischer, MD 07/13/14 [phone]

## 2014-07-13 NOTE — ED Notes (Signed)
Pt sent here from Urgent care for further eval of COPD exacerbation.  Pt has had cold symptoms for about 10 days, and shortness of breath for about 5 days. Pt talking in complete sentences without difficulty.

## 2014-12-21 ENCOUNTER — Emergency Department (INDEPENDENT_AMBULATORY_CARE_PROVIDER_SITE_OTHER)
Admission: EM | Admit: 2014-12-21 | Discharge: 2014-12-21 | Disposition: A | Discharge status: Home / Self Care | Payer: BLUE CROSS/BLUE SHIELD | Source: Home / Self Care | Attending: Family Medicine | Admitting: Family Medicine

## 2014-12-21 ENCOUNTER — Encounter (HOSPITAL_COMMUNITY): Payer: Self-pay | Admitting: *Deleted

## 2014-12-21 DIAGNOSIS — K047 Periapical abscess without sinus: Secondary | ICD-10-CM | POA: Diagnosis not present

## 2014-12-21 DIAGNOSIS — Z760 Encounter for issue of repeat prescription: Secondary | ICD-10-CM

## 2014-12-21 HISTORY — DX: Hyperlipidemia, unspecified: E78.5

## 2014-12-21 LAB — POCT I-STAT, CHEM 8
BUN: 5 mg/dL — ABNORMAL LOW (ref 6–20)
Calcium, Ion: 1.2 mmol/L (ref 1.12–1.23)
Chloride: 101 mmol/L (ref 101–111)
Creatinine, Ser: 0.7 mg/dL (ref 0.44–1.00)
Glucose, Bld: 111 mg/dL — ABNORMAL HIGH (ref 65–99)
HCT: 40 % (ref 36.0–46.0)
Hemoglobin: 13.6 g/dL (ref 12.0–15.0)
Potassium: 3.2 mmol/L — ABNORMAL LOW (ref 3.5–5.1)
Sodium: 139 mmol/L (ref 135–145)
TCO2: 27 mmol/L (ref 0–100)

## 2014-12-21 MED ORDER — AMOXICILLIN-POT CLAVULANATE 875-125 MG PO TABS: 1.0000 | ORAL_TABLET | Freq: Two times a day (BID) | ORAL | Status: DC

## 2014-12-21 MED ORDER — TRAZODONE HCL 50 MG PO TABS: 50.0000 mg | ORAL_TABLET | Freq: Every day | ORAL | Status: DC

## 2014-12-21 MED ORDER — FLUOXETINE HCL (PMDD) 20 MG PO TABS: 20.0000 mg | ORAL_TABLET | Freq: Once | ORAL | Status: DC

## 2014-12-21 MED ORDER — LISINOPRIL-HYDROCHLOROTHIAZIDE 10-12.5 MG PO TABS: 1.0000 | ORAL_TABLET | Freq: Every day | ORAL | Status: DC

## 2014-12-21 NOTE — ED Notes (Signed)
C/O left upper toothache.  "It'll fall out eventually"; cannot afford dentist.  Also needs med refill (lisinopril-HCTZ 10/12.5mg , fluoxetine 40mg , trazadone 50mg ); does not have PCP.

## 2014-12-21 NOTE — ED Provider Notes (Signed)
CSN: [id number]     Arrival date & time 12/21/14  1554 History   First MD Initiated Contact with Patient 12/21/14 1716     Chief Complaint  Patient presents with  . Dental Pain  . Medication Refill   (Consider location/radiation/quality/duration/timing/severity/associated sxs/prior Treatment)  HPI   The patient is a 59 year old female presenting tonight with requests for refill of her blood pressure and antidepressant medications. She states she has no insurance and the primary care provider (Dr. Ernie Hew) "wanted to do a whole lot of unnecessary tests". Patient states she will follow-up with health and wellness for regular medication management. In addition, the patient broke a tooth "a few weeks ago" and stated she now has something draining from that area.  Past Medical History  Diagnosis Date  . Hypertension   . Asthma   . Peptic ulcer   . Depression   . Bladder disease   . COPD (chronic obstructive pulmonary disease)   . Hyperlipidemia    Past Surgical History  Procedure Laterality Date  . Abdominal hysterectomy     No family history on file. Social History  Substance Use Topics  . Smoking status: Former Research scientist (life sciences)  . Smokeless tobacco: None  . Alcohol Use: No   OB History    No data available     Review of Systems  Constitutional: Negative.  Negative for fever, chills and fatigue.  HENT: Negative.  Negative for mouth sores and sore throat.        Broken tooth.  Eyes: Negative.   Respiratory: Negative for shortness of breath.   Cardiovascular: Negative.   Gastrointestinal: Negative.   Endocrine: Negative.   Genitourinary: Negative.   Musculoskeletal: Negative.   Skin: Negative.   Neurological: Negative.   Hematological: Negative.   Psychiatric/Behavioral: Negative.     Allergies  Review of patient's allergies indicates no known allergies.  Home Medications   Prior to Admission medications   Medication Sig Start Date End Date Taking? Authorizing Provider   budesonide (PULMICORT) 0.5 MG/2ML nebulizer solution Take 2 mLs (0.5 mg total) by nebulization 2 (two) times daily. 05/29/14  Yes Harden Mo, MD  FLUoxetine (PROZAC) 10 MG capsule Take 10 mg by mouth 2 (two) times daily.   Yes Historical Provider, MD  ipratropium-albuterol (DUONEB) 0.5-2.5 (3) MG/3ML SOLN Take 3 mLs by nebulization every 6 (six) hours as needed. Shortness of breath/wheezing   Yes Historical Provider, MD  lisinopril-hydrochlorothiazide (PRINZIDE,ZESTORETIC) 10-12.5 MG per tablet Take 1 tablet by mouth daily. 09/06/11  Yes Ignacia Felling, PA-C  traZODone (DESYREL) 50 MG tablet Take 50 mg by mouth at bedtime.   Yes Historical Provider, MD  albuterol (PROVENTIL HFA;VENTOLIN HFA) 108 (90 BASE) MCG/ACT inhaler Inhale 2 puffs into the lungs every 4 (four) hours as needed for wheezing. 09/08/12   Liam Graham, PA-C  amoxicillin (AMOXIL) 500 MG capsule Take 1 capsule (500 mg total) by mouth 3 (three) times daily. 05/29/14   Harden Mo, MD  azithromycin (ZITHROMAX) 250 MG tablet Take 1 tablet (250 mg total) by mouth daily. Take first 2 tablets together, then 1 every day until finished. 07/13/14   Comer Locket, PA-C  gabapentin (NEURONTIN) 300 MG capsule Take 300 mg by mouth at bedtime.    Historical Provider, MD  HYDROcodone-acetaminophen (NORCO/VICODIN) 5-325 MG per tablet 1 to 2 tabs every 4 to 6 hours as needed for pain. 05/29/14   Harden Mo, MD  metroNIDAZOLE (FLAGYL) 500 MG tablet Take 1 tablet (500 mg total) by  mouth 2 (two) times daily. 05/29/14   Harden Mo, MD  mometasone-formoterol [facility]) 200-5 MCG/ACT AERO Inhale 2 puffs into the lungs 2 (two) times daily.    Historical Provider, MD  naproxen (NAPROSYN) 500 MG tablet Take 1 tablet (500 mg total) by mouth 2 (two) times daily. 05/29/14   Harden Mo, MD  predniSONE (DELTASONE) 20 MG tablet 3 tabs po day one, then 2 tabs daily x 4 days 07/13/14   Comer Locket, PA-C  simvastatin (ZOCOR) 20 MG tablet Take 20 mg by  mouth at bedtime.    Historical Provider, MD   Meds Ordered and Administered this Visit  Medications - No data to display  BP 151/88 mmHg  Pulse 78  Temp(Src) 98 F (36.7 C) (Oral)  Resp 16  SpO2 95% No data found.   Physical Exam  Constitutional: She appears well-developed and well-nourished. No distress.  Eyes: Conjunctivae are normal. Pupils are equal, round, and reactive to light. Right eye exhibits no discharge. Left eye exhibits no discharge. No scleral icterus.  Neck: Normal range of motion. Neck supple.  Cardiovascular: Normal rate, regular rhythm, normal heart sounds and intact distal pulses.  Exam reveals no gallop and no friction rub.   No murmur heard. Pulmonary/Chest: Effort normal and breath sounds normal. No respiratory distress. She has no wheezes. She has no rales. She exhibits no tenderness.  Lymphadenopathy:    She has no cervical adenopathy.  Skin: She is not diaphoretic.  Nursing note and vitals reviewed.  Patient has a broken tooth, left upper molar. No evidence of abscess or drainage.  ED Course  Procedures (including critical care time)  Labs Review Labs Reviewed  POCT I-STAT, CHEM 8 - Abnormal; Notable for the following:    Potassium 3.2 (*)    BUN 5 (*)    Glucose, Bld 111 (*)    All other components within normal limits    Imaging Review No results found.   Results for orders placed or performed during the hospital encounter of 12/21/14  I-STAT, chem 8  Result Value Ref Range   Sodium 139 135 - 145 mmol/L   Potassium 3.2 (L) 3.5 - 5.1 mmol/L   Chloride 101 101 - 111 mmol/L   BUN 5 (L) 6 - 20 mg/dL   Creatinine, Ser 0.70 0.44 - 1.00 mg/dL   Glucose, Bld 111 (H) 65 - 99 mg/dL   Calcium, Ion 1.20 1.12 - 1.23 mmol/L   TCO2 27 0 - 100 mmol/L   Hemoglobin 13.6 12.0 - 15.0 g/dL   HCT 40.0 36.0 - 46.0 %    MDM   1. Infection of tooth   2. Medication refill     Meds ordered this encounter  Medications  . traZODone (DESYREL) 50 MG  tablet    Sig: Take 1 tablet (50 mg total) by mouth at bedtime.    Dispense:  30 tablet    Refill:  1    Order Specific Question:  Supervising Provider    Answer:  Billy Fischer [phone]  . Fluoxetine HCl, PMDD, 20 MG TABS    Sig: Take 1 tablet (20 mg total) by mouth once.    Dispense:  30 each    Refill:  0    Order Specific Question:  Supervising Provider    Answer:  Billy Fischer [phone]  . lisinopril-hydrochlorothiazide (PRINZIDE,ZESTORETIC) 10-12.5 MG per tablet    Sig: Take 1 tablet by mouth daily.    Dispense:  30 tablet  Refill:  1    Order Specific Question:  Supervising Provider    Answer:  Billy Fischer [phone]  . amoxicillin-clavulanate (AUGMENTIN) 875-125 MG per tablet    Sig: Take 1 tablet by mouth every 12 (twelve) hours.    Dispense:  14 tablet    Refill:  0    Order Specific Question:  Supervising Provider    Answer:  Billy Fischer [phone]    Discussed with patient that she needs to follow-up with community health and wellness or her primary care provider for medication refills in the future. In addition patient given the number to [facility] for tooth extraction. The patient verbalizes understanding and agrees to plan of care.       Nehemiah Settle, NP 12/21/14 [phone]

## 2014-12-21 NOTE — Discharge Instructions (Signed)
Please call [facility] at [phone] to get the details to get your tooth pulled.   Dental Caries Dental caries is tooth decay. This decay can cause a hole in teeth (cavity) that can get bigger and deeper over time. HOME CARE  Brush and floss your teeth. Do this at least two times a day.  Use a fluoride toothpaste.  Use a mouth rinse if told by your dentist or doctor.  Eat less sugary and starchy foods. Drink less sugary drinks.  Avoid snacking often on sugary and starchy foods. Avoid sipping often on sugary drinks.  Keep regular checkups and cleanings with your dentist.  Use fluoride supplements if told by your dentist or doctor.  Allow fluoride to be applied to teeth if told by your dentist or doctor. Document Released: 12/23/2007 Document Revised: 07/30/2013 Document Reviewed: 03/17/2012 [facility] Patient Information 2015 [place], [place]. This information is not intended to replace advice given to you by your health care provider. Make sure you discuss any questions you have with your health care provider.

## 2015-02-12 ENCOUNTER — Emergency Department (HOSPITAL_COMMUNITY)
Admission: EM | Admit: 2015-02-12 | Discharge: 2015-02-12 | Disposition: A | Discharge status: Home / Self Care | Payer: BLUE CROSS/BLUE SHIELD | Attending: Emergency Medicine | Admitting: Emergency Medicine

## 2015-02-12 ENCOUNTER — Emergency Department (HOSPITAL_COMMUNITY): Payer: BLUE CROSS/BLUE SHIELD

## 2015-02-12 ENCOUNTER — Encounter (HOSPITAL_COMMUNITY): Payer: Self-pay | Admitting: *Deleted

## 2015-02-12 DIAGNOSIS — I10 Essential (primary) hypertension: Secondary | ICD-10-CM | POA: Diagnosis not present

## 2015-02-12 DIAGNOSIS — R079 Chest pain, unspecified: Secondary | ICD-10-CM | POA: Insufficient documentation

## 2015-02-12 DIAGNOSIS — Z87891 Personal history of nicotine dependence: Secondary | ICD-10-CM | POA: Diagnosis not present

## 2015-02-12 DIAGNOSIS — Z8711 Personal history of peptic ulcer disease: Secondary | ICD-10-CM | POA: Insufficient documentation

## 2015-02-12 DIAGNOSIS — J45901 Unspecified asthma with (acute) exacerbation: Secondary | ICD-10-CM

## 2015-02-12 DIAGNOSIS — E785 Hyperlipidemia, unspecified: Secondary | ICD-10-CM | POA: Diagnosis not present

## 2015-02-12 DIAGNOSIS — Z7689 Persons encountering health services in other specified circumstances: Secondary | ICD-10-CM | POA: Insufficient documentation

## 2015-02-12 DIAGNOSIS — Z79899 Other long term (current) drug therapy: Secondary | ICD-10-CM | POA: Insufficient documentation

## 2015-02-12 DIAGNOSIS — R0602 Shortness of breath: Secondary | ICD-10-CM | POA: Diagnosis present

## 2015-02-12 DIAGNOSIS — F329 Major depressive disorder, single episode, unspecified: Secondary | ICD-10-CM | POA: Insufficient documentation

## 2015-02-12 DIAGNOSIS — J441 Chronic obstructive pulmonary disease with (acute) exacerbation: Secondary | ICD-10-CM | POA: Insufficient documentation

## 2015-02-12 LAB — BASIC METABOLIC PANEL
Anion gap: 9 (ref 5–15)
BUN: 11 mg/dL (ref 6–20)
CO2: 25 mmol/L (ref 22–32)
Calcium: 9.1 mg/dL (ref 8.9–10.3)
Chloride: 102 mmol/L (ref 101–111)
Creatinine, Ser: 0.84 mg/dL (ref 0.44–1.00)
GFR calc Af Amer: >60 mL/min (ref >60)
GFR calc non Af Amer: >60 mL/min (ref >60)
Glucose, Bld: 106 mg/dL — ABNORMAL HIGH (ref 65–99)
Potassium: 3.6 mmol/L (ref 3.5–5.1)
Sodium: 136 mmol/L (ref 135–145)

## 2015-02-12 LAB — CBC
HCT: 37.8 % (ref 36.0–46.0)
Hemoglobin: 12.2 g/dL (ref 12.0–15.0)
MCH: 28.9 pg (ref 26.0–34.0)
MCHC: 32.3 g/dL (ref 30.0–36.0)
MCV: 89.6 fL (ref 78.0–100.0)
Platelets: 374 10*3/uL (ref 150–400)
RBC: 4.22 MIL/uL (ref 3.87–5.11)
RDW: 13.9 % (ref 11.5–15.5)
WBC: 6.8 10*3/uL (ref 4.0–10.5)

## 2015-02-12 LAB — BRAIN NATRIURETIC PEPTIDE: B Natriuretic Peptide: 24.4 pg/mL (ref 0.0–100.0)

## 2015-02-12 LAB — I-STAT TROPONIN, ED: Troponin i, poc: 0 ng/mL (ref 0.00–0.08)

## 2015-02-12 MED ORDER — PREDNISONE 20 MG PO TABS
60.0000 mg | ORAL_TABLET | Freq: Once | ORAL | Status: AC
  Administered 2015-02-12: 60 mg via ORAL
  Filled 2015-02-12: qty 3

## 2015-02-12 MED ORDER — ALBUTEROL SULFATE HFA 108 (90 BASE) MCG/ACT IN AERS
2.0000 | INHALATION_SPRAY | Freq: Once | RESPIRATORY_TRACT | Status: AC
  Administered 2015-02-12: 2 via RESPIRATORY_TRACT
  Filled 2015-02-12: qty 6.7

## 2015-02-12 MED ORDER — ALBUTEROL (5 MG/ML) CONTINUOUS INHALATION SOLN
10.0000 mg/h | INHALATION_SOLUTION | RESPIRATORY_TRACT | Status: DC
  Administered 2015-02-12: 10 mg/h via RESPIRATORY_TRACT
  Filled 2015-02-12: qty 20

## 2015-02-12 MED ORDER — IPRATROPIUM-ALBUTEROL 0.5-2.5 (3) MG/3ML IN SOLN: 3.0000 mL | Freq: Two times a day (BID) | RESPIRATORY_TRACT | Status: DC

## 2015-02-12 MED ORDER — IPRATROPIUM-ALBUTEROL 0.5-2.5 (3) MG/3ML IN SOLN
3.0000 mL | RESPIRATORY_TRACT | Status: DC
  Administered 2015-02-12: 3 mL via RESPIRATORY_TRACT
  Filled 2015-02-12: qty 3

## 2015-02-12 MED ORDER — PREDNISONE 10 MG PO TABS: 40.0000 mg | ORAL_TABLET | Freq: Every day | ORAL | Status: DC

## 2015-02-12 MED ORDER — IPRATROPIUM BROMIDE 0.02 % IN SOLN
0.5000 mg | Freq: Once | RESPIRATORY_TRACT | Status: AC
  Administered 2015-02-12: 0.5 mg via RESPIRATORY_TRACT
  Filled 2015-02-12: qty 2.5

## 2015-02-12 MED ORDER — IPRATROPIUM BROMIDE 0.02 % IN SOLN: 0.5000 mg | RESPIRATORY_TRACT | Status: DC

## 2015-02-12 MED ORDER — AEROCHAMBER PLUS W/MASK MISC
1.0000 | Freq: Once | Status: AC
  Administered 2015-02-12: 1
  Filled 2015-02-12: qty 1

## 2015-02-12 NOTE — ED Provider Notes (Signed)
Complain of of shortness of breath onset approximately 2 weeks ago typical for COPD, becoming worse today. No other associated symptoms. She feels her breathing is at baseline after treatment here. On exam no respiratory distress. Speaks in papragraphs Lungs clear to auscultation ED ECG REPORT   Date: 02/12/2015  Rate: 70  Rhythm: normal sinus rhythm  QRS Axis: normal  Intervals: normal  ST/T Wave abnormalities: nonspecific T wave changes  Conduction Disutrbances:none  Narrative Interpretation:   Old EKG Reviewed: unchanged  I have personally reviewed the EKG tracing and agree with the computerized printout as noted.  Orlie Dakin, MD 02/12/15 2322

## 2015-02-12 NOTE — ED Provider Notes (Signed)
X-ray viewed by me Results for orders placed or performed during the hospital encounter of [id number]  Basic metabolic panel  Result Value Ref Range   Sodium 136 135 - 145 mmol/L   Potassium 3.6 3.5 - 5.1 mmol/L   Chloride 102 101 - 111 mmol/L   CO2 25 22 - 32 mmol/L   Glucose, Bld 106 (H) 65 - 99 mg/dL   BUN 11 6 - 20 mg/dL   Creatinine, Ser 0.84 0.44 - 1.00 mg/dL   Calcium 9.1 8.9 - 10.3 mg/dL   GFR calc non Af Amer >60 >60 mL/min   GFR calc Af Amer >60 >60 mL/min   Anion gap 9 5 - 15  CBC  Result Value Ref Range   WBC 6.8 4.0 - 10.5 K/uL   RBC 4.22 3.87 - 5.11 MIL/uL   Hemoglobin 12.2 12.0 - 15.0 g/dL   HCT 37.8 36.0 - 46.0 %   MCV 89.6 78.0 - 100.0 fL   MCH 28.9 26.0 - 34.0 pg   MCHC 32.3 30.0 - 36.0 g/dL   RDW 13.9 11.5 - 15.5 %   Platelets 374 150 - 400 K/uL  Brain natriuretic peptide  Result Value Ref Range   B Natriuretic Peptide 24.4 0.0 - 100.0 pg/mL  I-stat troponin, ED (not at [facility], [facility])  Result Value Ref Range   Troponin i, poc 0.00 0.00 - 0.08 ng/mL   Comment 3           Dg Chest 2 View  02/12/2015  CLINICAL DATA:  Chest pain off and on for 6 months, shortness of breath today, hypertension, asthma, COPD, former smoker EXAM: CHEST  2 VIEW COMPARISON:  07/13/2014 FINDINGS: Upper normal heart size. Mediastinal contours and pulmonary vascularity normal. Emphysematous and minimal bronchitic changes consistent with COPD. LEFT basilar scarring stable. No acute infiltrate, pleural effusion, or pneumothorax. Bones unremarkable. IMPRESSION: COPD changes with LEFT basilar scarring. No acute abnormalities. Electronically Signed   By: Lavonia Dana M.D.   On: 02/12/2015 17:27     Orlie Dakin, MD 02/12/15 [id number]

## 2015-02-12 NOTE — ED Provider Notes (Signed)
Arrival Date & Time: 02/12/15 & 1611 History   Chief Complaint  Patient presents with  . Shortness of Breath   HPI Monica Scott is a 59 y.o. female who presents for assessment of the following:  Onset: Symptoms present since yesterday evening.  Context: Known PMHx of asthma and COPD.   Quality: Described as a dry nonproductive cough with increased wheezing for the past day.   Location: "Tightness in lungs".  Modifying Factors: Symptoms worse with cold air and worse with coughing spells.   Severity: Symptoms are currently moderate.  Endorses no associated symptoms of HA, AMS, Neck Pain, Fever, Chills, Fatigue, Chest Pain, SOB, Abdominal Pain, Back Pain, Increased Urinary Frequency, Dysuria, Hematuria, or Nausea, Emesis, Diarrhea, Blood per Rectum.  Past Medical History  I reviewed & agree with nursing's documentation of PMHx, PSHx, SHx & FHx. Past Medical History  Diagnosis Date  . Hypertension   . Asthma   . Peptic ulcer   . Depression   . Bladder disease   . COPD (chronic obstructive pulmonary disease) ([place])   . Hyperlipidemia    Past Surgical History  Procedure Laterality Date  . Abdominal hysterectomy     Social History   Social History  . Marital Status: Single    Spouse Name: N/A  . Number of Children: N/A  . Years of Education: N/A   Social History Main Topics  . Smoking status: Former Research scientist (life sciences)  . Smokeless tobacco: None  . Alcohol Use: No  . Drug Use: No  . Sexual Activity: Not Asked   Other Topics Concern  . None   Social History Narrative   History reviewed. No pertinent family history.  Review of Systems   Complete Review of Systems obtained and is negative except as stated in HPI.  Allergies  Review of patient's allergies indicates no known allergies.  Home Medications   Prior to Admission medications   Medication Sig Start Date End Date Taking? Authorizing Provider  albuterol (PROVENTIL) (2.5 MG/3ML) 0.083% nebulizer solution Take  2.5 mg by nebulization every 6 (six) hours as needed for wheezing or shortness of breath.   Yes Historical Provider, MD  ipratropium-albuterol (DUONEB) 0.5-2.5 (3) MG/3ML SOLN Take 3 mLs by nebulization every 6 (six) hours as needed. Shortness of breath/wheezing   Yes Historical Provider, MD  albuterol (PROVENTIL HFA;VENTOLIN HFA) 108 (90 BASE) MCG/ACT inhaler Inhale 2 puffs into the lungs every 4 (four) hours as needed for wheezing. Patient not taking: Reported on 02/12/2015 09/08/12   Liam Graham, PA-C  amoxicillin (AMOXIL) 500 MG capsule Take 1 capsule (500 mg total) by mouth 3 (three) times daily. Patient not taking: Reported on 02/12/2015 05/29/14   Harden Mo, MD  amoxicillin-clavulanate (AUGMENTIN) 875-125 MG per tablet Take 1 tablet by mouth every 12 (twelve) hours. Patient not taking: Reported on 02/12/2015 12/21/14   Nehemiah Settle, NP  azithromycin (ZITHROMAX) 250 MG tablet Take 1 tablet (250 mg total) by mouth daily. Take first 2 tablets together, then 1 every day until finished. Patient not taking: Reported on 02/12/2015 07/13/14   Comer Locket, PA-C  budesonide (PULMICORT) 0.5 MG/2ML nebulizer solution Take 2 mLs (0.5 mg total) by nebulization 2 (two) times daily. Patient not taking: Reported on 02/12/2015 05/29/14   Harden Mo, MD  Fluoxetine HCl, PMDD, 20 MG TABS Take 1 tablet (20 mg total) by mouth once. Patient not taking: Reported on 02/12/2015 12/21/14   Nehemiah Settle, NP  gabapentin (NEURONTIN) 300 MG capsule Take 300  mg by mouth at bedtime.    Historical Provider, MD  HYDROcodone-acetaminophen (NORCO/VICODIN) 5-325 MG per tablet 1 to 2 tabs every 4 to 6 hours as needed for pain. Patient not taking: Reported on 02/12/2015 05/29/14   Harden Mo, MD  lisinopril-hydrochlorothiazide (PRINZIDE,ZESTORETIC) 10-12.5 MG per tablet Take 1 tablet by mouth daily. Patient not taking: Reported on 02/12/2015 12/21/14   Nehemiah Settle, NP  metroNIDAZOLE (FLAGYL) 500 MG  tablet Take 1 tablet (500 mg total) by mouth 2 (two) times daily. Patient not taking: Reported on 02/12/2015 05/29/14   Harden Mo, MD  naproxen (NAPROSYN) 500 MG tablet Take 1 tablet (500 mg total) by mouth 2 (two) times daily. Patient not taking: Reported on 02/12/2015 05/29/14   Harden Mo, MD  predniSONE (DELTASONE) 10 MG tablet Take 4 tablets (40 mg total) by mouth daily. 02/12/15   Voncille Lo, MD  traZODone (DESYREL) 50 MG tablet Take 1 tablet (50 mg total) by mouth at bedtime. Patient not taking: Reported on 02/12/2015 12/21/14   Nehemiah Settle, NP    Physical Exam  Vitals & Nursing notes reviewed. BP 101/63 mmHg  Pulse 74  Temp(Src) 98.1 F (36.7 C) (Oral)  Resp 21  Ht 4\' 11"  (1.499 m)  Wt 127 lb (57.607 kg)  BMI 25.64 kg/m2  SpO2 91% Physical Exam Vitals & Nursing notes reviewed. CONST: female, in no acute distress. Appears WD/WN & stated age. HEAD: [place]/AT. EYES: PERRL. No conjunctival injection & lids symmetrical. ENMT: External nose & ears atraumatic. MM moist.  Oropharynx w/o swelling or exudates. NECK: Supple, w/o meningismus. Trachea midline w/o JVD. Stridor absent. CVS: S1/S2 audible w/o gallops. Murmur absent. Peripheral pulses 2+ & equal in all extremities. Cap refill < 2 seconds. RESP: Respiratory effort normal. Lungs w/ bilateral wheeze. GI: Soft, w/o TTP. Guarding & rebound absent. BS normal. BACK: W/o CVA TTP bilaterally. SKIN: Warm & dry, w/o rash. W/o open wound. NEURO: AAOx3. CN II-XIII grossly intact.  Sensation w/o deficit. Strength w/o deficit. Tremor absent. PSYCH:  Cooperative, w/ mood & affect appropriate. MSK: Extremities w/o deformity or TTP.  Joints stable, w/o warmth. W/o cyanosis.   ED Course  Procedures  Labs Review Labs Reviewed  BASIC METABOLIC PANEL - Abnormal; Notable for the following:    Glucose, Bld 106 (*)    All other components within normal limits  CBC  BRAIN NATRIURETIC PEPTIDE  I-STAT TROPOININ, ED    Imaging  Review No results found.  I personally visualized all Labs & Imaging results, which were used in the medical decision making of this patient's treatment and disposition.  EKG Interpretation  EKG Interpretation  Date/Time:  Wednesday February 12 2015 16:17:23 EST Ventricular Rate:  71 PR Interval:  122 QRS Duration: 72 QT Interval:  366 QTC Calculation: 397 R Axis:   88 Text Interpretation:  Normal sinus rhythm Normal ECG ED PHYSICIAN INTERPRETATION AVAILABLE IN [facility] Confirmed by TEST, Record ([id number]) on 02/13/2015 7:54:14 AM      MDM  Malva Limes is a 59 y.o. female with H&P as above. ED clinical course as follows:  Known obstructive disease. Presents to the ED with wheezing, shortness of breath concerning for COPD Exacerbation.  Upon arrival patient respiratory work mildly increased with vitals unremarkable for hypoxia. Placed on continuous Pulse Oximetry and Bedside Cardiac monitor. Patient without evidence of cardiopulmonary distress. WOB and vitals appropriate given clinical picture.  Without evidence of abnormal mental status, hypoxia or impaired perfusion, therefore Sepsis or  other serious bacterial infection not likely. H&P not consistent with cardiac etiology such as ACS or CHF Exacerbation or Arrhythmia. Patient appears well hydrated and is tolerating PO. H&P not consistent with Anaphylaxis or Angioedema.  Given lung sounds are NOT focal, and given negative imaging, Pneumonia unlikely.  EKG obtained and personally visualized and I interpret as NS Rhythm without acute Ischemic ST-T wave changes or arrhythmia.   Patient treated with supplemental oxygen along with Albuterol and Ipratropium provided via nebulizer. Patient provided with PO prednisone for steroid coverage.  Antibiotics are unnecessasry at this time. However will discharge with course of prednisone.   Due to reassuring appearance following symptomatic treatment, presentation consistent with  uncomplicated COPD Exacerbation patient appropriate for discharge.  Through shared decision making, the patient and I agree with discharge at this time. Return precautions for symptoms discussed, and I confirmed comprehension. Patient has reliable care and adequate means to be reassessed.  Follow up plan agreed upon and provided in AVS. Both nursing and I confirmed there were no further concerns or questions prior to discharge.  Clinical Impression:  1. Asthma with COPD with exacerbation [facility])    Patient care discussed with Dr. Winfred Leeds, who oversaw their evaluation & treatment & voiced agreement. House Officer: Voncille Lo, MD, Emergency Medicine Resident.  Voncille Lo, MD 02/18/15 [place], MD 02/19/15 [phone]

## 2015-02-12 NOTE — Discharge Instructions (Signed)
Asthma, Acute Bronchospasm °Acute bronchospasm caused by asthma is also referred to as an asthma attack. Bronchospasm means your air passages become narrowed. The narrowing is caused by inflammation and tightening of the muscles in the air tubes (bronchi) in your lungs. This can make it hard to breathe or cause you to wheeze and cough. °CAUSES °Possible triggers are: °· Animal dander from the skin, hair, or feathers of animals. °· Dust mites contained in house dust. °· Cockroaches. °· Pollen from trees or grass. °· Mold. °· Cigarette or tobacco smoke. °· Air pollutants such as dust, household cleaners, hair sprays, aerosol sprays, paint fumes, strong chemicals, or strong odors. °· Cold air or weather changes. Cold air may trigger inflammation. Winds increase molds and pollens in the air. °· Strong emotions such as crying or laughing hard. °· Stress. °· Certain medicines such as aspirin or beta-blockers. °· Sulfites in foods and drinks, such as dried fruits and wine. °· Infections or inflammatory conditions, such as a flu, cold, or inflammation of the nasal membranes (rhinitis). °· Gastroesophageal reflux disease (GERD). GERD is a condition where stomach acid backs up into your esophagus. °· Exercise or strenuous activity. °SIGNS AND SYMPTOMS  °· Wheezing. °· Excessive coughing, particularly at night. °· Chest tightness. °· Shortness of breath. °DIAGNOSIS  °Your health care provider will ask you about your medical history and perform a physical exam. A chest X-ray or blood testing may be performed to look for other causes of your symptoms or other conditions that may have triggered your asthma attack.  °TREATMENT  °Treatment is aimed at reducing inflammation and opening up the airways in your lungs.  Most asthma attacks are treated with inhaled medicines. These include quick relief or rescue medicines (such as bronchodilators) and controller medicines (such as inhaled corticosteroids). These medicines are sometimes  given through an inhaler or a nebulizer. Systemic steroid medicine taken by mouth or given through an IV tube also can be used to reduce the inflammation when an attack is moderate or severe. Antibiotic medicines are only used if a bacterial infection is present.  °HOME CARE INSTRUCTIONS  °· Rest. °· Drink plenty of liquids. This helps the mucus to remain thin and be easily coughed up. Only use caffeine in moderation and do not use alcohol until you have recovered from your illness. °· Do not smoke. Avoid being exposed to secondhand smoke. °· You play a critical role in keeping yourself in good health. Avoid exposure to things that cause you to wheeze or to have breathing problems. °· Keep your medicines up-to-date and available. Carefully follow your health care provider's treatment plan. °· Take your medicine exactly as prescribed. °· When pollen or pollution is bad, keep windows closed and use an air conditioner or go to places with air conditioning. °· Asthma requires careful medical care. See your health care provider for a follow-up as advised. If you are more than [redacted] weeks pregnant and you were prescribed any new medicines, let your obstetrician know about the visit and how you are doing. Follow up with your health care provider as directed. °· After you have recovered from your asthma attack, make an appointment with your outpatient doctor to talk about ways to reduce the likelihood of future attacks. If you do not have a doctor who manages your asthma, make an appointment with a primary care doctor to discuss your asthma. °SEEK IMMEDIATE MEDICAL CARE IF:  °· You are getting worse. °· You have trouble breathing. If severe, call your local   emergency services (911 in the [place] °· You develop chest pain or discomfort. °· You are vomiting. °· You are not able to keep fluids down. °· You are coughing up yellow, green, brown, or bloody sputum. °· You have a fever and your symptoms suddenly get worse. °· You have  trouble swallowing. °MAKE SURE YOU:  °· Understand these instructions. °· Will watch your condition. °· Will get help right away if you are not doing well or get worse. °  °This information is not intended to replace advice given to you by your health care provider. Make sure you discuss any questions you have with your health care provider. °  °Document Released: 06/30/2006 Document Revised: 03/20/2013 Document Reviewed: 09/20/2012 °Elsevier Interactive Patient Education ©2016 Else[employer] Inc[employer] ° °Chronic Obstructive Pulmonary Disease Exacerbation °Chronic obstructive pulmonary disease (COPD) is a common lung problem. In COPD, the flow of air from the lungs is limited. COPD exacerbations are times that breathing gets worse and you need extra treatment. Without treatment they can be life threatening. If they happen often, your lungs can become more damaged. If your COPD gets worse, your doctor may treat you with: °· Medicines. °· Oxygen. °· Different ways to clear your airway, such as using a mask. °HOME CARE °· Do not smoke. °· Avoid tobacco smoke and other things that bother your lungs. °· If given, take your antibiotic medicine as told. Finish the medicine even if you start to feel better. °· Only take medicines as told by your doctor. °· Drink enough fluids to keep your pee (urine) clear or pale yellow (unless your doctor has told you not to). °· Use a cool mist machine (vaporizer). °· If you use oxygen or a machine that turns liquid medicine into a mist (nebulizer), continue to use them as told. °· Keep up with shots (vaccinations) as told by your doctor. °· Exercise regularly. °· Eat healthy foods. °· Keep all doctor visits as told. °GET HELP RIGHT AWAY IF: °· You are very short of breath and it gets worse. °· You have trouble talking. °· You have bad chest pain. °· You have blood in your spit (sputum). °· You have a fever. °· You keep throwing up (vomiting). °· You feel weak, or you pass out (faint). °· You feel  confused. °· You keep getting worse. °MAKE SURE YOU: °· Understand these instructions. °· Will watch your condition. °· Will get help right away if you are not doing well or get worse. °  °This information is not intended to replace advice given to you by your health care provider. Make sure you discuss any questions you have with your health care provider. °  °Document Released: 03/04/2011 Document Revised: 04/05/2014 Document Reviewed: 11/17/2012 °Elsevier Interactive Patient Education ©2016 Elsevier Inc. ° °

## 2015-02-12 NOTE — ED Notes (Signed)
Pt reports sob with mild exertion, having non productive cough. Denies fever or swelling to extremities. No resp distress noted, ekg done.

## 2015-05-25 ENCOUNTER — Emergency Department (HOSPITAL_COMMUNITY)
Admission: EM | Admit: 2015-05-25 | Discharge: 2015-05-25 | Disposition: A | Discharge status: Home / Self Care | Payer: BLUE CROSS/BLUE SHIELD | Attending: Emergency Medicine | Admitting: Emergency Medicine

## 2015-05-25 ENCOUNTER — Emergency Department (HOSPITAL_COMMUNITY): Payer: BLUE CROSS/BLUE SHIELD

## 2015-05-25 ENCOUNTER — Encounter (HOSPITAL_COMMUNITY): Payer: Self-pay

## 2015-05-25 DIAGNOSIS — Z87891 Personal history of nicotine dependence: Secondary | ICD-10-CM | POA: Diagnosis not present

## 2015-05-25 DIAGNOSIS — I10 Essential (primary) hypertension: Secondary | ICD-10-CM | POA: Insufficient documentation

## 2015-05-25 DIAGNOSIS — Z7952 Long term (current) use of systemic steroids: Secondary | ICD-10-CM | POA: Insufficient documentation

## 2015-05-25 DIAGNOSIS — Z8639 Personal history of other endocrine, nutritional and metabolic disease: Secondary | ICD-10-CM | POA: Insufficient documentation

## 2015-05-25 DIAGNOSIS — J441 Chronic obstructive pulmonary disease with (acute) exacerbation: Secondary | ICD-10-CM | POA: Diagnosis not present

## 2015-05-25 DIAGNOSIS — Z8711 Personal history of peptic ulcer disease: Secondary | ICD-10-CM | POA: Insufficient documentation

## 2015-05-25 DIAGNOSIS — Z79899 Other long term (current) drug therapy: Secondary | ICD-10-CM | POA: Diagnosis not present

## 2015-05-25 DIAGNOSIS — F329 Major depressive disorder, single episode, unspecified: Secondary | ICD-10-CM | POA: Diagnosis not present

## 2015-05-25 DIAGNOSIS — Z87448 Personal history of other diseases of urinary system: Secondary | ICD-10-CM | POA: Diagnosis not present

## 2015-05-25 DIAGNOSIS — R05 Cough: Secondary | ICD-10-CM | POA: Diagnosis present

## 2015-05-25 MED ORDER — PREDNISONE 10 MG PO TABS: 40.0000 mg | ORAL_TABLET | Freq: Every day | ORAL | Status: DC

## 2015-05-25 NOTE — ED Provider Notes (Signed)
CSN: [id number]     Arrival date & time 05/25/15  0757 History   First MD Initiated Contact with Patient 05/25/15 [phone]     Chief Complaint  Patient presents with  . Cough  . Nasal Congestion   (Consider location/radiation/quality/duration/timing/severity/associated sxs/prior Treatment) HPI 60 y.o. female with a hx of COPD, presents to the Emergency Department today complaining of cough x 1 week. Associated sinus congestion. Patient states that she recently got over a cold with URI symptoms (fever, shortness of breath, rhinorrhea), but has since resolved. Notes lingering cough and thinks that it is her COPD "acting up." Pt has had exacerbations in that past. Pt does not smoke anymore. No fevers reported. No CP/ABD pain. No N/V/D. No headaches. No numbness/tingling. No other symptoms noted. Does not use home O2.    Past Medical History  Diagnosis Date  . Hypertension   . Asthma   . Peptic ulcer   . Depression   . Bladder disease   . COPD (chronic obstructive pulmonary disease) ([place])   . Hyperlipidemia    Past Surgical History  Procedure Laterality Date  . Abdominal hysterectomy     History reviewed. No pertinent family history. Social History  Substance Use Topics  . Smoking status: Former Research scientist (life sciences)  . Smokeless tobacco: None  . Alcohol Use: No   OB History    No data available     Review of Systems ROS reviewed and all are negative for acute change except as noted in the HPI.  Allergies  Review of patient's allergies indicates no known allergies.  Home Medications   Prior to Admission medications   Medication Sig Start Date End Date Taking? Authorizing Provider  albuterol (PROVENTIL HFA;VENTOLIN HFA) 108 (90 BASE) MCG/ACT inhaler Inhale 2 puffs into the lungs every 4 (four) hours as needed for wheezing. 09/08/12  Yes Liam Graham, PA-C  Fluoxetine HCl, PMDD, 20 MG TABS Take 1 tablet (20 mg total) by mouth once. Patient taking differently: Take 40 mg by mouth once.   12/21/14  Yes Nehemiah Settle, NP  gabapentin (NEURONTIN) 300 MG capsule Take 300 mg by mouth at bedtime.   Yes Historical Provider, MD  lisinopril-hydrochlorothiazide (PRINZIDE,ZESTORETIC) 10-12.5 MG per tablet Take 1 tablet by mouth daily. 12/21/14  Yes Nehemiah Settle, NP  traZODone (DESYREL) 50 MG tablet Take 1 tablet (50 mg total) by mouth at bedtime. Patient taking differently: Take 100 mg by mouth at bedtime.  12/21/14  Yes Nehemiah Settle, NP  amoxicillin (AMOXIL) 500 MG capsule Take 1 capsule (500 mg total) by mouth 3 (three) times daily. Patient not taking: Reported on 02/12/2015 05/29/14   Harden Mo, MD  amoxicillin-clavulanate (AUGMENTIN) 875-125 MG per tablet Take 1 tablet by mouth every 12 (twelve) hours. Patient not taking: Reported on 02/12/2015 12/21/14   Nehemiah Settle, NP  azithromycin (ZITHROMAX) 250 MG tablet Take 1 tablet (250 mg total) by mouth daily. Take first 2 tablets together, then 1 every day until finished. Patient not taking: Reported on 02/12/2015 07/13/14   Comer Locket, PA-C  budesonide (PULMICORT) 0.5 MG/2ML nebulizer solution Take 2 mLs (0.5 mg total) by nebulization 2 (two) times daily. Patient not taking: Reported on 02/12/2015 05/29/14   Harden Mo, MD  HYDROcodone-acetaminophen (NORCO/VICODIN) 5-325 MG per tablet 1 to 2 tabs every 4 to 6 hours as needed for pain. Patient not taking: Reported on 02/12/2015 05/29/14   Harden Mo, MD  metroNIDAZOLE (FLAGYL) 500 MG tablet Take 1 tablet (500  mg total) by mouth 2 (two) times daily. Patient not taking: Reported on 02/12/2015 05/29/14   Harden Mo, MD  naproxen (NAPROSYN) 500 MG tablet Take 1 tablet (500 mg total) by mouth 2 (two) times daily. Patient not taking: Reported on 02/12/2015 05/29/14   Harden Mo, MD  predniSONE (DELTASONE) 10 MG tablet Take 4 tablets (40 mg total) by mouth daily. 02/12/15   Voncille Lo, MD   BP 129/71 mmHg  Pulse 85  Temp(Src) 98.3 F (36.8 C) (Oral)  Resp 18   SpO2 94%   Physical Exam  Constitutional: She is oriented to person, place, and time. She appears well-developed and well-nourished.  HENT:  Head: Normocephalic and atraumatic.  Right Ear: Tympanic membrane, external ear and ear canal normal.  Left Ear: Tympanic membrane, external ear and ear canal normal.  Nose: Nose normal.  Mouth/Throat: Uvula is midline, oropharynx is clear and moist and mucous membranes are normal.  Eyes: EOM are normal. Pupils are equal, round, and reactive to light.  Neck: Neck supple.  Cardiovascular: Normal rate, regular rhythm and normal heart sounds.   Pulmonary/Chest: Effort normal and breath sounds normal.  Abdominal: Soft. There is no tenderness.  Musculoskeletal: Normal range of motion.  Neurological: She is alert and oriented to person, place, and time.  Skin: Skin is warm and dry.  Psychiatric: She has a normal mood and affect. Her behavior is normal. Thought content normal.  Nursing note and vitals reviewed.   ED Course  Procedures (including critical care time) Labs Review Labs Reviewed - No data to display  Imaging Review Dg Chest 2 View  05/25/2015  CLINICAL DATA:  Congestion, productive cough for 2 days.  COPD EXAM: CHEST  2 VIEW COMPARISON:  02/12/2015 FINDINGS: There is hyperinflation of the lungs compatible with COPD. Left basilar scarring, stable since prior study. No confluent opacity on the right. No effusions or acute bony abnormality. IMPRESSION: COPD.  Left basilar scarring.  No active disease. Electronically Signed   By: Rolm Baptise M.D.   On: 05/25/2015 08:44   I have personally reviewed and evaluated these images and lab results as part of my medical decision-making.   EKG Interpretation None      MDM  I have reviewed and evaluated the relevant imaging studies. I have reviewed the relevant previous healthcare records. I obtained HPI from historian. Patient discussed with supervising physician  ED Course:  Assessment: 32y  F presents with cough. Noted URI symptoms x2-3 weeks. No fevers. Hx of COPD exacerbations in the past. On exam, pt in NAD. Nontoxic/nonseptic appearing. VSS. Not hypoxic. Febrile. Lungs CTA. No wheezing noted. Heart RRR. Abdomen nontender/soft. TMs WNL. No erythema in posterior pharynx. CXR shows no pneumonia. Imaging consistent with COPD. Will DC patient home with Prednisone x5 days. Will have them follow up with PCP for further management of symptoms. Patient agrees to plan. At this time, Patient is in no acute distress. Vital Signs are stable. Patient is able to ambulate. Patient able to tolerate PO.     Disposition/Plan:  DC home Additional Verbal discharge instructions given and discussed with patient.  Pt Instructed to f/u with PCP in the next 48-72 hours for evaluation and treatment of symptoms. Return precautions given Pt acknowledges and agrees with plan  Supervising Physician Jola Schmidt, MD   Final diagnoses:  COPD exacerbation [facility])       Shary Decamp, PA-C 05/25/15 [place], MD 05/25/15 [phone]

## 2015-05-25 NOTE — ED Notes (Signed)
Awake. Verbally responsive. A/O x4. Resp even and unlabored. No audible adventitious breath sounds noted. ABC's intact.  

## 2015-05-25 NOTE — ED Notes (Signed)
Pt reported productive congested cough to greenish sputum and fever x2 days with [facility] with exertion. Auscultated clear breath sounds.

## 2015-05-25 NOTE — ED Notes (Signed)
Pt taken to x-ray and returned to room without distress noted. 

## 2015-05-25 NOTE — ED Notes (Addendum)
Pt here with Daughter and grandaughter.  Pt has cough congestion and fever x 1 week.  Fever not checked at home.  Green mucous productive cough.  COPD with triage sats 94-95%

## 2015-05-25 NOTE — Discharge Instructions (Signed)
Please read and follow all provided instructions.  Your diagnoses today include:  1. COPD exacerbation ([place])     Tests performed today include:  Chest x-ray - did not show pneumonia  Vital signs. See below for your results today.   Medications prescribed:   Take any prescribed medications only as directed.  Home care instructions:  Follow any educational materials contained in this packet.  Follow-up instructions: Please follow-up with your primary care provider in the next 3 days for further evaluation of your symptoms and management of your COPD.   Return instructions:   Please return to the Emergency Department if you experience worsening symptoms.  Please return with worsening wheezing, shortness of breath, or difficulty breathing.  Return with persistent fever above 101F.   Please return if you have any other emergent concerns.  Additional Information:  Your vital signs today were: BP 129/71 mmHg   Pulse 85   Temp(Src) 98.3 F (36.8 C) (Oral)   Resp 18   SpO2 94% If your blood pressure (BP) was elevated above 135/85 this visit, please have this repeated by your doctor within one month. --------------

## 2015-07-21 ENCOUNTER — Encounter (HOSPITAL_COMMUNITY): Payer: Self-pay | Admitting: *Deleted

## 2015-07-21 ENCOUNTER — Emergency Department (HOSPITAL_COMMUNITY)
Admission: EM | Admit: 2015-07-21 | Discharge: 2015-07-22 | Disposition: A | Discharge status: Home / Self Care | Payer: BLUE CROSS/BLUE SHIELD | Attending: Emergency Medicine | Admitting: Emergency Medicine

## 2015-07-21 DIAGNOSIS — N8110 Cystocele, unspecified: Secondary | ICD-10-CM | POA: Diagnosis not present

## 2015-07-21 DIAGNOSIS — I10 Essential (primary) hypertension: Secondary | ICD-10-CM | POA: Insufficient documentation

## 2015-07-21 DIAGNOSIS — J449 Chronic obstructive pulmonary disease, unspecified: Secondary | ICD-10-CM | POA: Diagnosis not present

## 2015-07-21 LAB — URINE MICROSCOPIC-ADD ON: RBC / HPF: NONE SEEN RBC/hpf (ref 0–5)

## 2015-07-21 LAB — CBC
HCT: 35.6 % — ABNORMAL LOW (ref 36.0–46.0)
Hemoglobin: 11.5 g/dL — ABNORMAL LOW (ref 12.0–15.0)
MCH: 28.3 pg (ref 26.0–34.0)
MCHC: 32.3 g/dL (ref 30.0–36.0)
MCV: 87.7 fL (ref 78.0–100.0)
Platelets: 370 10*3/uL (ref 150–400)
RBC: 4.06 MIL/uL (ref 3.87–5.11)
RDW: 15.1 % (ref 11.5–15.5)
WBC: 9.6 10*3/uL (ref 4.0–10.5)

## 2015-07-21 LAB — URINALYSIS, ROUTINE W REFLEX MICROSCOPIC
Bilirubin Urine: NEGATIVE
Glucose, UA: NEGATIVE mg/dL
Hgb urine dipstick: NEGATIVE
Ketones, ur: NEGATIVE mg/dL
Nitrite: NEGATIVE
Protein, ur: NEGATIVE mg/dL
Specific Gravity, Urine: 1.004 — ABNORMAL LOW (ref 1.005–1.030)
pH: 6.5 (ref 5.0–8.0)

## 2015-07-21 LAB — BASIC METABOLIC PANEL
Anion gap: 12 (ref 5–15)
BUN: 8 mg/dL (ref 6–20)
CO2: 25 mmol/L (ref 22–32)
Calcium: 9.1 mg/dL (ref 8.9–10.3)
Chloride: 100 mmol/L — ABNORMAL LOW (ref 101–111)
Creatinine, Ser: 1.09 mg/dL — ABNORMAL HIGH (ref 0.44–1.00)
GFR calc Af Amer: >60 mL/min (ref >60)
GFR calc non Af Amer: 54 mL/min — ABNORMAL LOW (ref >60)
Glucose, Bld: 120 mg/dL — ABNORMAL HIGH (ref 65–99)
Potassium: 3.7 mmol/L (ref 3.5–5.1)
Sodium: 137 mmol/L (ref 135–145)

## 2015-07-21 NOTE — ED Notes (Signed)
The pt is c/o somehting coming out of her vagina for one month.  She self caths herself because her bladder does not work and she has had  Difficulty cathing herself due to the swelling

## 2015-07-21 NOTE — ED Notes (Signed)
Only sl pain  No bleeding

## 2015-08-17 ENCOUNTER — Encounter (HOSPITAL_COMMUNITY): Payer: Self-pay | Admitting: Family Medicine

## 2015-08-17 ENCOUNTER — Emergency Department (HOSPITAL_COMMUNITY)
Admission: EM | Admit: 2015-08-17 | Discharge: 2015-08-18 | Disposition: A | Discharge status: Home / Self Care | Payer: BLUE CROSS/BLUE SHIELD | Attending: Emergency Medicine | Admitting: Emergency Medicine

## 2015-08-17 DIAGNOSIS — J449 Chronic obstructive pulmonary disease, unspecified: Secondary | ICD-10-CM | POA: Insufficient documentation

## 2015-08-17 DIAGNOSIS — F329 Major depressive disorder, single episode, unspecified: Secondary | ICD-10-CM | POA: Diagnosis not present

## 2015-08-17 DIAGNOSIS — E785 Hyperlipidemia, unspecified: Secondary | ICD-10-CM | POA: Diagnosis not present

## 2015-08-17 DIAGNOSIS — I10 Essential (primary) hypertension: Secondary | ICD-10-CM | POA: Diagnosis not present

## 2015-08-17 DIAGNOSIS — N8110 Cystocele, unspecified: Secondary | ICD-10-CM

## 2015-08-17 DIAGNOSIS — Z7952 Long term (current) use of systemic steroids: Secondary | ICD-10-CM | POA: Diagnosis not present

## 2015-08-17 DIAGNOSIS — Z87891 Personal history of nicotine dependence: Secondary | ICD-10-CM | POA: Diagnosis not present

## 2015-08-17 NOTE — ED Provider Notes (Signed)
CSN: [id number]     Arrival date & time 08/17/15  1700 History   First MD Initiated Contact with Patient 08/17/15 2254     Chief Complaint  Patient presents with  . Vaginal Prolapse   PT SAID THAT SHE HAS A BULGE IN HER VAGINA FOR THE PAST MONTH.  PT NOTICES IT IS WORSE WITH COUGHING.  THE PT SAID THAT SHE HAS TO SELF-CATH DUE TO URINARY RETENTION AND IT GETS IN THE WAY.  SHE HAS TO PUSH IT BACK UP IN ORDER TO DO IT.  PT CAME IN TONIGHT BECAUSE SX ARE NOT IMPROVING AND SHE WANTED TO KNOW WHAT IT IS.  (Consider location/radiation/quality/duration/timing/severity/associated sxs/prior Treatment) The history is provided by the patient.    Past Medical History  Diagnosis Date  . Hypertension   . Asthma   . Peptic ulcer   . Depression   . Bladder disease   . COPD (chronic obstructive pulmonary disease) ([place])   . Hyperlipidemia    Past Surgical History  Procedure Laterality Date  . Abdominal hysterectomy     History reviewed. No pertinent family history. Social History  Substance Use Topics  . Smoking status: Former Research scientist (life sciences)  . Smokeless tobacco: None  . Alcohol Use: No     Comment: Former ETOH   OB History    No data available     Review of Systems  Genitourinary:       VAGINAL MASS  All other systems reviewed and are negative.     Allergies  Review of patient's allergies indicates no known allergies.  Home Medications   Prior to Admission medications   Medication Sig Start Date End Date Taking? Authorizing Provider  albuterol (PROVENTIL HFA;VENTOLIN HFA) 108 (90 BASE) MCG/ACT inhaler Inhale 2 puffs into the lungs every 4 (four) hours as needed for wheezing. 09/08/12  Yes Liam Graham, PA-C  albuterol (PROVENTIL) (2.5 MG/3ML) 0.083% nebulizer solution Take 2.5 mg by nebulization every 6 (six) hours as needed for wheezing or shortness of breath.   Yes Historical Provider, MD  Fluoxetine HCl, PMDD, 20 MG TABS Take 1 tablet (20 mg total) by mouth once. Patient taking  differently: Take 40 mg by mouth daily.  12/21/14  Yes Nehemiah Settle, NP  gabapentin (NEURONTIN) 400 MG capsule Take 400 mg by mouth daily.   Yes Historical Provider, MD  lisinopril-hydrochlorothiazide (PRINZIDE,ZESTORETIC) 10-12.5 MG per tablet Take 1 tablet by mouth daily. 12/21/14  Yes Nehemiah Settle, NP  Omega-3 Fatty Acids (FISH OIL PO) Take 1 capsule by mouth daily.   Yes Historical Provider, MD  traZODone (DESYREL) 50 MG tablet Take 1 tablet (50 mg total) by mouth at bedtime. Patient taking differently: Take 100 mg by mouth at bedtime.  12/21/14  Yes Nehemiah Settle, NP  predniSONE (DELTASONE) 10 MG tablet Take 4 tablets (40 mg total) by mouth daily. Patient not taking: Reported on 08/17/2015 05/25/15   Shary Decamp, PA-C   BP 128/85 mmHg  Pulse 63  Temp(Src) 98.4 F (36.9 C) (Oral)  Resp 16  Ht 4\' 11"  (1.499 m)  Wt 130 lb (58.968 kg)  BMI 26.24 kg/m2  SpO2 97% Physical Exam  Constitutional: She is oriented to person, place, and time. She appears well-developed.  HENT:  Head: Normocephalic and atraumatic.  Right Ear: External ear normal.  Left Ear: External ear normal.  Nose: Nose normal.  Mouth/Throat: Oropharynx is clear and moist.  Eyes: Conjunctivae and EOM are normal. Pupils are equal, round, and reactive to light.  Neck: Normal range of motion. Neck supple.  Cardiovascular: Normal rate, regular rhythm, normal heart sounds and intact distal pulses.   Pulmonary/Chest: Effort normal and breath sounds normal.  Abdominal: Soft. Bowel sounds are normal.  Genitourinary:  BLADDER PROLAPSE NOTED  Musculoskeletal: Normal range of motion.  Neurological: She is alert and oriented to person, place, and time.  Skin: Skin is warm and dry.  Psychiatric: She has a normal mood and affect. Her behavior is normal. Judgment and thought content normal.  Nursing note and vitals reviewed.   ED Course  Procedures (including critical care time) Labs Review Labs Reviewed - No data to  display  Imaging Review No results found. I have personally reviewed and evaluated these images and lab results as part of my medical decision-making.   EKG Interpretation None      MDM   Final diagnoses:  Bladder prolapse, female, acquired       Isla Pence, MD 08/17/15 2349

## 2015-08-17 NOTE — ED Notes (Signed)
Pt reports she has a "buldge" at her vaginal area that occurred about a month ago. Pt reports nothing different in the last month except it gets worse when she coughs. Denies any vaginal bleeding.

## 2015-09-27 ENCOUNTER — Encounter (HOSPITAL_COMMUNITY): Payer: Self-pay | Admitting: *Deleted

## 2015-09-27 ENCOUNTER — Emergency Department (HOSPITAL_COMMUNITY)
Admission: EM | Admit: 2015-09-27 | Discharge: 2015-09-27 | Disposition: A | Discharge status: Home / Self Care | Payer: BLUE CROSS/BLUE SHIELD | Attending: Emergency Medicine | Admitting: Emergency Medicine

## 2015-09-27 DIAGNOSIS — J449 Chronic obstructive pulmonary disease, unspecified: Secondary | ICD-10-CM | POA: Diagnosis not present

## 2015-09-27 DIAGNOSIS — M545 Low back pain, unspecified: Secondary | ICD-10-CM

## 2015-09-27 DIAGNOSIS — Z87891 Personal history of nicotine dependence: Secondary | ICD-10-CM | POA: Diagnosis not present

## 2015-09-27 DIAGNOSIS — M549 Dorsalgia, unspecified: Secondary | ICD-10-CM | POA: Diagnosis present

## 2015-09-27 DIAGNOSIS — I10 Essential (primary) hypertension: Secondary | ICD-10-CM | POA: Diagnosis not present

## 2015-09-27 DIAGNOSIS — Z79899 Other long term (current) drug therapy: Secondary | ICD-10-CM | POA: Diagnosis not present

## 2015-09-27 MED ORDER — METHOCARBAMOL 500 MG PO TABS: 500.0000 mg | ORAL_TABLET | Freq: Two times a day (BID) | ORAL | Status: DC

## 2015-09-27 NOTE — Discharge Instructions (Signed)
Your back discomfort is likely due to a muscle spasm. You may continue taking naproxen as we discussed. Do not take this medication with your [facility] powder as they were similarly. You may take your Robaxin at night that will help, do not take this while driving. Return to ED for new or worsening symptoms, follow-up with your doctor as needed.

## 2015-09-27 NOTE — ED Provider Notes (Signed)
CSN: [id number]     Arrival date & time 09/27/15  [id number] History   First MD Initiated Contact with Patient 09/27/15 [phone]     Chief Complaint  Patient presents with  . Back Pain  . Hip Pain     (Consider location/radiation/quality/duration/timing/severity/associated sxs/prior Treatment) HPI Monica Scott is a 60 y.o. female history of chronic back pain here for evaluation of acute back and hip pain. She reports it started on Thursday morning when she was getting out of the bed. Gradually worsening, not improving with BC powder. Characterized as sharp pain, worse with movement, especially getting up and started walking, but improves once she is moving. Denies any fevers, chills, abdominal pain, urinary or bowel symptoms, IV drug use, numbness or weakness, nausea or vomiting, chest pain or shortness of breath, headache or vision changes. Reports her grandkids live with her, 78 years old, and she picks them up frequently and this may have contributed to her back pain.  Past Medical History  Diagnosis Date  . Hypertension   . Asthma   . Peptic ulcer   . Depression   . Bladder disease   . COPD (chronic obstructive pulmonary disease) ([place])   . Hyperlipidemia    Past Surgical History  Procedure Laterality Date  . Abdominal hysterectomy     No family history on file. Social History  Substance Use Topics  . Smoking status: Former Research scientist (life sciences)  . Smokeless tobacco: None  . Alcohol Use: No     Comment: Former ETOH   OB History    No data available     Review of Systems A 10 point review of systems was completed and was negative except for pertinent positives and negatives as mentioned in the history of present illness     Allergies  Review of patient's allergies indicates no known allergies.  Home Medications   Prior to Admission medications   Medication Sig Start Date End Date Taking? Authorizing Provider  Fluoxetine HCl, PMDD, 20 MG TABS Take 1 tablet (20 mg total) by mouth  once. Patient taking differently: Take 40 mg by mouth daily.  12/21/14  Yes Nehemiah Settle, NP  gabapentin (NEURONTIN) 400 MG capsule Take 400 mg by mouth daily.   Yes Historical Provider, MD  lisinopril-hydrochlorothiazide (PRINZIDE,ZESTORETIC) 10-12.5 MG per tablet Take 1 tablet by mouth daily. 12/21/14  Yes Nehemiah Settle, NP  traZODone (DESYREL) 50 MG tablet Take 1 tablet (50 mg total) by mouth at bedtime. Patient taking differently: Take 100 mg by mouth at bedtime.  12/21/14  Yes Nehemiah Settle, NP  albuterol (PROVENTIL HFA;VENTOLIN HFA) 108 (90 BASE) MCG/ACT inhaler Inhale 2 puffs into the lungs every 4 (four) hours as needed for wheezing. Patient not taking: Reported on 09/27/2015 09/08/12   Liam Graham, PA-C  predniSONE (DELTASONE) 10 MG tablet Take 4 tablets (40 mg total) by mouth daily. Patient not taking: Reported on 08/17/2015 05/25/15   Shary Decamp, PA-C   BP 132/83 mmHg  Pulse 82  Temp(Src) 98.3 F (36.8 C)  Resp 16  SpO2 99% Physical Exam  Constitutional: She appears well-developed and well-nourished. No distress.  HENT:  Head: Normocephalic and atraumatic.  Eyes: Conjunctivae and EOM are normal. Right eye exhibits no discharge. Left eye exhibits no discharge. No scleral icterus.  Neck: Normal range of motion. Neck supple.  Pulmonary/Chest: Effort normal. No respiratory distress.  Abdominal: Soft. She exhibits no distension. There is no tenderness.  Musculoskeletal: Normal range of motion.  Diffuse tenderness in  left lumbar paraspinal musculature. No focal midline bony or spinous process tenderness. No crepitus, tenting, rash or other skin abnormality. Full active range of motion of CTL spine. Full active range of motion of all extremities.  Neurological:  Motor strength and sensation appear baseline for patient. Able to stand on tiptoe, dorsiflex great toe. Gait baseline.  Skin: She is not diaphoretic.    ED Course  Procedures (including critical care time) Labs  Review Labs Reviewed - No data to display  Imaging Review No results found. I have personally reviewed and evaluated these images and lab results as part of my medical decision-making.   EKG Interpretation None      MDM  Patient with back pain. Appears to be musculoskeletal in nature. No neurological deficits and normal neuro exam.  Patient can walk but states is painful.  No loss of bowel or bladder control.  No concern for cauda equina.  No fever, night sweats, weight loss, h/o cancer, IVDU.  RICE protocol and pain medicine indicated and discussed with patient.   Final diagnoses:  Left-sided low back pain without sciatica        Comer Locket, PA-C 09/27/15 [place], MD 09/27/15 [phone]

## 2015-09-27 NOTE — ED Notes (Signed)
Pt verbalized understanding of d/c instructions, prescriptions, and follow-up care. No further questions/concerns, VSS, ambulatory w/ steady gait (refused wheelchair) 

## 2015-09-27 NOTE — ED Notes (Signed)
Pt c/o L sided hip and back pain since Thursday. Reports pain has worsened since onset; denies bowel/bladder issues

## 2015-11-03 ENCOUNTER — Emergency Department (HOSPITAL_COMMUNITY): Payer: BLUE CROSS/BLUE SHIELD

## 2015-11-03 ENCOUNTER — Encounter (HOSPITAL_COMMUNITY): Payer: Self-pay

## 2015-11-03 ENCOUNTER — Emergency Department (HOSPITAL_COMMUNITY)
Admission: EM | Admit: 2015-11-03 | Discharge: 2015-11-03 | Disposition: A | Discharge status: Home / Self Care | Payer: BLUE CROSS/BLUE SHIELD | Attending: Emergency Medicine | Admitting: Emergency Medicine

## 2015-11-03 DIAGNOSIS — I10 Essential (primary) hypertension: Secondary | ICD-10-CM | POA: Diagnosis not present

## 2015-11-03 DIAGNOSIS — Z87891 Personal history of nicotine dependence: Secondary | ICD-10-CM | POA: Insufficient documentation

## 2015-11-03 DIAGNOSIS — J441 Chronic obstructive pulmonary disease with (acute) exacerbation: Secondary | ICD-10-CM | POA: Diagnosis not present

## 2015-11-03 DIAGNOSIS — Z79899 Other long term (current) drug therapy: Secondary | ICD-10-CM | POA: Diagnosis not present

## 2015-11-03 DIAGNOSIS — J45909 Unspecified asthma, uncomplicated: Secondary | ICD-10-CM | POA: Diagnosis not present

## 2015-11-03 DIAGNOSIS — R0602 Shortness of breath: Secondary | ICD-10-CM | POA: Diagnosis present

## 2015-11-03 MED ORDER — BUDESONIDE 1 MG/2ML IN SUSP: 1.0000 mg | Freq: Every day | RESPIRATORY_TRACT | 2 refills | Status: DC

## 2015-11-03 MED ORDER — ALBUTEROL SULFATE (2.5 MG/3ML) 0.083% IN NEBU
5.0000 mg | INHALATION_SOLUTION | Freq: Once | RESPIRATORY_TRACT | Status: AC
  Administered 2015-11-03: 5 mg via RESPIRATORY_TRACT
  Filled 2015-11-03: qty 6

## 2015-11-03 MED ORDER — PREDNISONE 20 MG PO TABS: ORAL_TABLET | ORAL | 0 refills | Status: DC

## 2015-11-03 MED ORDER — ALBUTEROL SULFATE HFA 108 (90 BASE) MCG/ACT IN AERS
2.0000 | INHALATION_SPRAY | RESPIRATORY_TRACT | Status: DC | PRN
  Administered 2015-11-03: 2 via RESPIRATORY_TRACT
  Filled 2015-11-03: qty 6.7

## 2015-11-03 MED ORDER — IPRATROPIUM-ALBUTEROL 0.5-2.5 (3) MG/3ML IN SOLN
3.0000 mL | Freq: Once | RESPIRATORY_TRACT | Status: AC
  Administered 2015-11-03: 3 mL via RESPIRATORY_TRACT
  Filled 2015-11-03: qty 6

## 2015-11-03 MED ORDER — PREDNISONE 20 MG PO TABS
60.0000 mg | ORAL_TABLET | Freq: Once | ORAL | Status: AC
  Administered 2015-11-03: 60 mg via ORAL
  Filled 2015-11-03: qty 3

## 2015-11-03 NOTE — ED Notes (Signed)
Pt states that "I feel better after that breathing treatment".

## 2015-11-03 NOTE — ED Notes (Signed)
Dr. Belfi at bedside 

## 2015-11-03 NOTE — ED Notes (Signed)
Patient transported to X-ray 

## 2015-11-03 NOTE — ED Notes (Signed)
Oxygen via nasal cannula turned off to measure O2 saturation on RA.

## 2015-11-03 NOTE — ED Triage Notes (Signed)
Per pt: She has been feeling short of breath "for the last month" she has also been coughing for the last three days "but nothing is coming up". Pt states that she ran out of her medication and has not been taking it. Pt states that "It's hard to catch my breath if I am up and walking across a room". Pt states that "If I am sitting up I am usually ok".

## 2015-11-03 NOTE — ED Provider Notes (Signed)
[place] DEPT Provider Note   CSN: [id number] Arrival date & time: 11/03/15  0702  First Provider Contact:  First MD Initiated Contact with Patient 11/03/15 [phone]        History   Chief Complaint Chief Complaint  Patient presents with  . Shortness of Breath    HPI Monica Scott is a 60 y.o. female.  Patient presents with shortness of breath. She has a history of COPD. She's been out of her steroid inhaler for several weeks. She states her breathing has been bad for about the last month but got worse over last 2 days. She is also out of her rescue inhaler but she does have nebulizer treatments at home that she's been using without improvement in symptoms. She denies any chest pain. She has a dry cough which is nonproductive. She denies any fevers. She denies any leg pain or swelling.      Past Medical History:  Diagnosis Date  . Asthma   . Bladder disease   . COPD (chronic obstructive pulmonary disease) ([place])   . Depression   . Hyperlipidemia   . Hypertension   . Peptic ulcer     There are no active problems to display for this patient.   Past Surgical History:  Procedure Laterality Date  . ABDOMINAL HYSTERECTOMY      OB History    No data available       Home Medications    Prior to Admission medications   Medication Sig Start Date End Date Taking? Authorizing Provider  diclofenac sodium (VOLTAREN) 1 % GEL Apply 4 g topically 4 (four) times daily as needed (for pain).   Yes Historical Provider, MD  Fluoxetine HCl, PMDD, 20 MG TABS Take 1 tablet (20 mg total) by mouth once. Patient taking differently: Take 40 mg by mouth daily.  12/21/14  Yes Nehemiah Settle, NP  gabapentin (NEURONTIN) 400 MG capsule Take 400 mg by mouth daily.   Yes Historical Provider, MD  lisinopril-hydrochlorothiazide (PRINZIDE,ZESTORETIC) 10-12.5 MG per tablet Take 1 tablet by mouth daily. 12/21/14  Yes Nehemiah Settle, NP  traZODone (DESYREL) 50 MG tablet Take 1 tablet (50 mg  total) by mouth at bedtime. Patient taking differently: Take 100 mg by mouth at bedtime.  12/21/14  Yes Nehemiah Settle, NP  albuterol (PROVENTIL HFA;VENTOLIN HFA) 108 (90 BASE) MCG/ACT inhaler Inhale 2 puffs into the lungs every 4 (four) hours as needed for wheezing. 09/08/12   Freeman Caldron Baker, PA-C  budesonide (PULMICORT) 1 MG/2ML nebulizer solution Take 2 mLs (1 mg total) by nebulization daily. 11/03/15   Malvin Johns, MD  methocarbamol (ROBAXIN) 500 MG tablet Take 1 tablet (500 mg total) by mouth 2 (two) times daily. Patient not taking: Reported on 11/03/2015 09/27/15   Comer Locket, PA-C  predniSONE (DELTASONE) 10 MG tablet Take 4 tablets (40 mg total) by mouth daily. Patient not taking: Reported on 08/17/2015 05/25/15   Shary Decamp, PA-C  predniSONE (DELTASONE) 20 MG tablet Take one tablet daily for 5 days 11/03/15   Malvin Johns, MD    Family History No family history on file.  Social History Social History  Substance Use Topics  . Smoking status: Former Research scientist (life sciences)  . Smokeless tobacco: Never Used  . Alcohol use No     Comment: Former ETOH     Allergies   Review of patient's allergies indicates no known allergies.   Review of Systems Review of Systems  Constitutional: Negative for chills, diaphoresis, fatigue and fever.  HENT: Negative for congestion, rhinorrhea and sneezing.   Eyes: Negative.   Respiratory: Positive for cough, shortness of breath and wheezing. Negative for chest tightness.   Cardiovascular: Negative for chest pain and leg swelling.  Gastrointestinal: Negative for abdominal pain, blood in stool, diarrhea, nausea and vomiting.  Genitourinary: Negative for difficulty urinating, flank pain, frequency and hematuria.  Musculoskeletal: Negative for arthralgias and back pain.  Skin: Negative for rash.  Neurological: Negative for dizziness, speech difficulty, weakness, numbness and headaches.     Physical Exam Updated Vital Signs BP 124/83   Pulse 74   Temp 97.8  F (36.6 C) (Oral)   Resp 14   SpO2 92%   Physical Exam  Constitutional: She is oriented to person, place, and time. She appears well-developed and well-nourished.  HENT:  Head: Normocephalic and atraumatic.  Eyes: Pupils are equal, round, and reactive to light.  Neck: Normal range of motion. Neck supple.  Cardiovascular: Normal rate, regular rhythm and normal heart sounds.   Pulmonary/Chest: Effort normal and breath sounds normal. No respiratory distress. She has no wheezes. She has no rales. She exhibits no tenderness.  Diminished BS bilaterally  Abdominal: Soft. Bowel sounds are normal. There is no tenderness. There is no rebound and no guarding.  Musculoskeletal: Normal range of motion. She exhibits no edema.  No edema or calf tenderness  Lymphadenopathy:    She has no cervical adenopathy.  Neurological: She is alert and oriented to person, place, and time.  Skin: Skin is warm and dry. No rash noted.  Psychiatric: She has a normal mood and affect.     ED Treatments / Results  Labs (all labs ordered are listed, but only abnormal results are displayed) Labs Reviewed - No data to display  EKG  EKG Interpretation  Date/Time:  Monday November 03 2015 07:10:54 EDT Ventricular Rate:  83 PR Interval:    QRS Duration: 91 QT Interval:  383 QTC Calculation: 450 R Axis:   87 Text Interpretation:  Sinus rhythm Borderline right axis deviation since last tracing no significant change Confirmed by Eulalio Reamy  MD, Larwence Tu ([id number]) on 11/03/2015 7:27:31 AM       Radiology Dg Chest 2 View  Result Date: 11/03/2015 CLINICAL DATA:  Short of breath for 1 month EXAM: CHEST  2 VIEW COMPARISON:  05/25/2015 FINDINGS: Normal heart size. Stable left basilar scarring. Hyperaeration. No pneumothorax or pleural effusion. IMPRESSION: No active cardiopulmonary disease. Electronically Signed   By: Marybelle Killings M.D.   On: 11/03/2015 07:59    Procedures Procedures (including critical care  time)  Medications Ordered in ED Medications  albuterol (PROVENTIL HFA;VENTOLIN HFA) 108 (90 Base) MCG/ACT inhaler 2 puff (not administered)  albuterol (PROVENTIL) (2.5 MG/3ML) 0.083% nebulizer solution 5 mg (5 mg Nebulization Given 11/03/15 0742)  predniSONE (DELTASONE) tablet 60 mg (60 mg Oral Given 11/03/15 0747)  ipratropium-albuterol (DUONEB) 0.5-2.5 (3) MG/3ML nebulizer solution 3 mL (3 mLs Nebulization Given 11/03/15 0927)     Initial Impression / Assessment and Plan / ED Course  I have reviewed the triage vital signs and the nursing notes.  Pertinent labs & imaging results that were available during my care of the patient were reviewed by me and considered in my medical decision making (see chart for details).  Clinical Course    Patient presents for shortness of breath. Her symptoms are consistent with a COPD exacerbation. She was given 2 nebulizer treatments in the ED as well as a dose of prednisone. She's feeling much better after  this. She's maintaining oxygen saturations over 92% on room air with no increased work of breathing. No tachypnea. Her chest x-ray is clear without evidence of pneumonia. She was discharged home in good condition. She was encouraged to follow-up with her PCP. She has been out of her Pulmicort and I gave her refill on this. She was also dispensed with an albuterol MDI. She was started on a five-day course of prednisone. Return precautions were given.  Final Clinical Impressions(s) / ED Diagnoses   Final diagnoses:  COPD exacerbation (H[facility])    New Prescriptions New Prescriptions   BUDESONIDE (PULMICORT) 1 MG/2ML NEBULIZER SOLUTION    Take 2 mLs (1 mg total) by nebulization daily.   PREDNISONE (DELTASONE) 20 MG TABLET    Take one tablet daily for 5 days     Malvin Johns, MD 11/03/15 1202

## 2015-12-31 ENCOUNTER — Encounter (HOSPITAL_COMMUNITY): Payer: Self-pay

## 2015-12-31 ENCOUNTER — Emergency Department (HOSPITAL_COMMUNITY)
Admission: EM | Admit: 2015-12-31 | Discharge: 2015-12-31 | Disposition: A | Discharge status: Home / Self Care | Payer: BLUE CROSS/BLUE SHIELD | Attending: Emergency Medicine | Admitting: Emergency Medicine

## 2015-12-31 DIAGNOSIS — R109 Unspecified abdominal pain: Secondary | ICD-10-CM | POA: Diagnosis present

## 2015-12-31 DIAGNOSIS — Z87891 Personal history of nicotine dependence: Secondary | ICD-10-CM | POA: Diagnosis not present

## 2015-12-31 DIAGNOSIS — N12 Tubulo-interstitial nephritis, not specified as acute or chronic: Secondary | ICD-10-CM | POA: Diagnosis not present

## 2015-12-31 DIAGNOSIS — I10 Essential (primary) hypertension: Secondary | ICD-10-CM | POA: Insufficient documentation

## 2015-12-31 DIAGNOSIS — Z79899 Other long term (current) drug therapy: Secondary | ICD-10-CM | POA: Insufficient documentation

## 2015-12-31 DIAGNOSIS — J449 Chronic obstructive pulmonary disease, unspecified: Secondary | ICD-10-CM | POA: Diagnosis not present

## 2015-12-31 LAB — CBC WITH DIFFERENTIAL/PLATELET
Basophils Absolute: 0 10*3/uL (ref 0.0–0.1)
Basophils Relative: 0 %
Eosinophils Absolute: 0.1 10*3/uL (ref 0.0–0.7)
Eosinophils Relative: 1 %
HCT: 35 % — ABNORMAL LOW (ref 36.0–46.0)
Hemoglobin: 11.5 g/dL — ABNORMAL LOW (ref 12.0–15.0)
Lymphocytes Relative: 7 %
Lymphs Abs: 1.3 10*3/uL (ref 0.7–4.0)
MCH: 29.1 pg (ref 26.0–34.0)
MCHC: 32.9 g/dL (ref 30.0–36.0)
MCV: 88.6 fL (ref 78.0–100.0)
Monocytes Absolute: 1.1 10*3/uL — ABNORMAL HIGH (ref 0.1–1.0)
Monocytes Relative: 6 %
Neutro Abs: 14.8 10*3/uL — ABNORMAL HIGH (ref 1.7–7.7)
Neutrophils Relative %: 86 %
Platelets: 353 10*3/uL (ref 150–400)
RBC: 3.95 MIL/uL (ref 3.87–5.11)
RDW: 14.1 % (ref 11.5–15.5)
WBC: 17.3 10*3/uL — ABNORMAL HIGH (ref 4.0–10.5)

## 2015-12-31 LAB — URINALYSIS, ROUTINE W REFLEX MICROSCOPIC
Bilirubin Urine: NEGATIVE
Glucose, UA: NEGATIVE mg/dL
Ketones, ur: NEGATIVE mg/dL
Nitrite: NEGATIVE
Protein, ur: NEGATIVE mg/dL
Specific Gravity, Urine: 1.017 (ref 1.005–1.030)
pH: 6 (ref 5.0–8.0)

## 2015-12-31 LAB — URINE MICROSCOPIC-ADD ON

## 2015-12-31 LAB — I-STAT CHEM 8, ED
BUN: 12 mg/dL (ref 6–20)
Calcium, Ion: 1.12 mmol/L — ABNORMAL LOW (ref 1.15–1.40)
Chloride: 95 mmol/L — ABNORMAL LOW (ref 101–111)
Creatinine, Ser: 0.8 mg/dL (ref 0.44–1.00)
Glucose, Bld: 152 mg/dL — ABNORMAL HIGH (ref 65–99)
HCT: 36 % (ref 36.0–46.0)
Hemoglobin: 12.2 g/dL (ref 12.0–15.0)
Potassium: 3.2 mmol/L — ABNORMAL LOW (ref 3.5–5.1)
Sodium: 132 mmol/L — ABNORMAL LOW (ref 135–145)
TCO2: 29 mmol/L (ref 0–100)

## 2015-12-31 MED ORDER — SODIUM CHLORIDE 0.9 % IV BOLUS (SEPSIS)
1000.0000 mL | Freq: Once | INTRAVENOUS | Status: AC
  Administered 2015-12-31: 1000 mL via INTRAVENOUS

## 2015-12-31 MED ORDER — ONDANSETRON HCL 4 MG/2ML IJ SOLN
4.0000 mg | Freq: Once | INTRAMUSCULAR | Status: AC
  Administered 2015-12-31: 4 mg via INTRAVENOUS
  Filled 2015-12-31: qty 2

## 2015-12-31 MED ORDER — KETOROLAC TROMETHAMINE 30 MG/ML IJ SOLN
30.0000 mg | Freq: Once | INTRAMUSCULAR | Status: AC
  Administered 2015-12-31: 30 mg via INTRAVENOUS
  Filled 2015-12-31: qty 1

## 2015-12-31 MED ORDER — DEXTROSE 5 % IV SOLN
1.0000 g | Freq: Once | INTRAVENOUS | Status: AC
  Administered 2015-12-31: 1 g via INTRAVENOUS
  Filled 2015-12-31: qty 10

## 2015-12-31 MED ORDER — POTASSIUM CHLORIDE CRYS ER 20 MEQ PO TBCR
20.0000 meq | EXTENDED_RELEASE_TABLET | Freq: Once | ORAL | Status: AC
  Administered 2015-12-31: 20 meq via ORAL
  Filled 2015-12-31: qty 1

## 2015-12-31 MED ORDER — HYDROCODONE-ACETAMINOPHEN 5-325 MG PO TABS: 1.0000 | ORAL_TABLET | Freq: Four times a day (QID) | ORAL | 0 refills | Status: AC | PRN

## 2015-12-31 MED ORDER — SULFAMETHOXAZOLE-TRIMETHOPRIM 800-160 MG PO TABS: 1.0000 | ORAL_TABLET | Freq: Two times a day (BID) | ORAL | 0 refills | Status: AC

## 2015-12-31 NOTE — ED Provider Notes (Signed)
[facility] DEPT Provider Note   CSN: [id number] Arrival date & time: 12/31/15  0615     History   Chief Complaint Chief Complaint  Patient presents with  . Hip Pain    HPI SHAQUIA COOPERMAN is a 60 y.o. female.  Patient is a 60 year old female with history of prolapsed bladder who self catheterizes, presenting with bilateral flank pain that has been worsening over the past 2 days. She describes the pain as sharp and radiating to her "kidneys." She reports fever, chills, and nausea, but denies any abdominal pain, vomiting, change in bowel habits, blood in stools, dysuria, hematuria, vaginal pain or discharge. Past surgical hx of abdominal hysterectomy. Does not see nephrologist or urologist. Also has hx of chronic back pain, but denies any numbness, weakness, or incontinence of bowel or bladder.      Past Medical History:  Diagnosis Date  . Asthma   . Bladder disease   . COPD (chronic obstructive pulmonary disease) ([place])   . Depression   . Hyperlipidemia   . Hypertension   . Peptic ulcer     There are no active problems to display for this patient.   Past Surgical History:  Procedure Laterality Date  . ABDOMINAL HYSTERECTOMY      OB History    No data available       Home Medications    Prior to Admission medications   Medication Sig Start Date End Date Taking? Authorizing Provider  albuterol (PROVENTIL HFA;VENTOLIN HFA) 108 (90 BASE) MCG/ACT inhaler Inhale 2 puffs into the lungs every 4 (four) hours as needed for wheezing. 09/08/12   Freeman Caldron Baker, PA-C  budesonide (PULMICORT) 1 MG/2ML nebulizer solution Take 2 mLs (1 mg total) by nebulization daily. 11/03/15   Malvin Johns, MD  diclofenac sodium (VOLTAREN) 1 % GEL Apply 4 g topically 4 (four) times daily as needed (for pain).    Historical Provider, MD  Fluoxetine HCl, PMDD, 20 MG TABS Take 1 tablet (20 mg total) by mouth once. Patient taking differently: Take 40 mg by mouth daily.  12/21/14   Nehemiah Settle, NP  gabapentin (NEURONTIN) 400 MG capsule Take 400 mg by mouth daily.    Historical Provider, MD  lisinopril-hydrochlorothiazide (PRINZIDE,ZESTORETIC) 10-12.5 MG per tablet Take 1 tablet by mouth daily. 12/21/14   Nehemiah Settle, NP  methocarbamol (ROBAXIN) 500 MG tablet Take 1 tablet (500 mg total) by mouth 2 (two) times daily. Patient not taking: Reported on 11/03/2015 09/27/15   Comer Locket, PA-C  predniSONE (DELTASONE) 10 MG tablet Take 4 tablets (40 mg total) by mouth daily. Patient not taking: Reported on 08/17/2015 05/25/15   Shary Decamp, PA-C  predniSONE (DELTASONE) 20 MG tablet Take one tablet daily for 5 days 11/03/15   Malvin Johns, MD  traZODone (DESYREL) 50 MG tablet Take 1 tablet (50 mg total) by mouth at bedtime. Patient taking differently: Take 100 mg by mouth at bedtime.  12/21/14   Nehemiah Settle, NP    Family History No family history on file.  Social History Social History  Substance Use Topics  . Smoking status: Former Research scientist (life sciences)  . Smokeless tobacco: Never Used  . Alcohol use No     Comment: Former ETOH     Allergies   Review of patient's allergies indicates no known allergies.   Review of Systems Review of Systems  Constitutional: Positive for chills and fever.  HENT: Negative for ear pain and sore throat.   Eyes: Negative for pain and  visual disturbance.  Respiratory: Negative for cough and shortness of breath.   Cardiovascular: Negative for chest pain, palpitations and leg swelling.  Gastrointestinal: Positive for nausea. Negative for abdominal pain, blood in stool and vomiting.  Genitourinary: Positive for flank pain. Negative for dysuria, hematuria, pelvic pain and vaginal discharge.  Musculoskeletal: Positive for back pain (chronic). Negative for gait problem and neck pain.  Skin: Negative for color change and rash.  Neurological: Negative for dizziness, seizures, syncope, weakness, numbness and headaches.     Physical Exam Updated Vital  Signs BP 123/74 (BP Location: Right Arm)   Pulse 99   Temp 98.2 F (36.8 C) (Oral)   Resp 18   SpO2 94%   Physical Exam  Constitutional: She appears well-developed and well-nourished. No distress.  HENT:  Head: Normocephalic and atraumatic.  Mouth/Throat: Oropharynx is clear and moist.  Eyes: Conjunctivae are normal.  Neck: Normal range of motion. Neck supple.  Cardiovascular: Normal rate, regular rhythm, normal heart sounds and intact distal pulses.   Pulmonary/Chest: Effort normal and breath sounds normal. No respiratory distress. She exhibits no tenderness.  Abdominal: Soft. Bowel sounds are normal. There is no guarding.  Mild suprapubic TTP. Neg Murphy's, neg McBurney's. Positive bilateral CVA tenderness   Musculoskeletal: Normal range of motion. She exhibits no edema.  Lumbar and thoracic spine FROM without spinous process TTP, no bony stepoffs or deformities, no paraspinous muscle TTP or muscle spasms. Strength 5/5 in all extremities, sensation grossly intact in all extremities, negative SLR bilaterally, gait steady. Distal pulses intact. Bilateral hips with FROM, with no bony or joint line TTP, no bruising or swelling, no crepitus or deformity.  Neurological: She is alert.  Skin: Skin is warm and dry.  Psychiatric: She has a normal mood and affect.  Nursing note and vitals reviewed.    ED Treatments / Results  Labs (all labs ordered are listed, but only abnormal results are displayed) Labs Reviewed  URINALYSIS, ROUTINE W REFLEX MICROSCOPIC (NOT AT [facility])    EKG  EKG Interpretation None       Radiology No results found.  Procedures Procedures (including critical care time)  Medications Ordered in ED Medications  sodium chloride 0.9 % bolus 1,000 mL (not administered)  ondansetron (ZOFRAN) injection 4 mg (not administered)  ketorolac (TORADOL) 30 MG/ML injection 30 mg (not administered)     Initial Impression / Assessment and Plan / ED Course  I have  reviewed the triage vital signs and the nursing notes.  Pertinent labs & imaging results that were available during my care of the patient were reviewed by me and considered in my medical decision making (see chart for details).  Clinical Course   Patient is 60 yo F with chief complaint of worsening bilateral flank pain x 2 days. Patient has hx of bladder prolapse and self catheterizes (often reusing the same catheter), but does not regularly see PCP or urologist. Urinalysis obtained by in and out cath shows moderate leukocytes and many bacteria. Sent for culture, and started on IV Rocephin for suspected pyelonephritis. CBC shows leukocytosis of 17.3, and blood cultures obtained. Chem 8 shows mild hypokalemia of 3.2, and 20 meq PO Klor-Con given. IV fluids started with Toradol for pain.   On reassessment, patient states pain improved. VSS, patient is afebrile, and stable for d/c home with 10 days Bactrim and Norco for pain. Advised to compete full course of antibiotics and schedule appointment with [facility] (contact information provided). Patient also states she will f/u with  new primary care provider, and strict return precautions given to return to ED for new or worsening symptoms including fever, vomiting, abdominal pain, or flank pain.  Final Clinical Impressions(s) / ED Diagnoses   Final diagnoses:  Pyelonephritis    New Prescriptions Discharge Medication List as of 12/31/2015  9:30 AM    START taking these medications   Details  HYDROcodone-acetaminophen (NORCO/VICODIN) 5-325 MG tablet Take 1 tablet by mouth every 6 (six) hours as needed., Starting Wed 12/31/2015, Until Sat 01/03/2016, Print    sulfamethoxazole-trimethoprim (BACTRIM DS,SEPTRA DS) 800-160 MG tablet Take 1 tablet by mouth 2 (two) times daily., Starting Wed 12/31/2015, Until Sat 01/10/2016, Print         Stacie Glaze [place], [place] 12/31/15 1022    Virgel Manifold, MD 12/31/15 [id number]

## 2015-12-31 NOTE — ED Notes (Signed)
Pt is now reporting fever, chills, nausea, and heartburn. Pt states she self caths at home

## 2015-12-31 NOTE — ED Triage Notes (Signed)
PT complains of RIGHT hip pain radiating to back. She states she has "kidney pain" and "something floating up in here" as she points to left ribs. Pt denies injury or urinary symptoms

## 2015-12-31 NOTE — Discharge Instructions (Signed)
Please schedule an appointment with a new primary care doctor as we discussed in 2 days. Drink plenty of fluids over the next week and take complete course of antibiotics as instructed. Schedule an appointment with urologist in 2 days, and return to ED for any new or worsening symptoms.

## 2016-01-02 LAB — URINE CULTURE: Culture: >=100000 — AB

## 2016-01-03 ENCOUNTER — Telehealth (HOSPITAL_BASED_OUTPATIENT_CLINIC_OR_DEPARTMENT_OTHER): Payer: Self-pay

## 2016-01-03 NOTE — Telephone Encounter (Signed)
Post ED Visit - Positive Culture Follow-up  Culture report reviewed by antimicrobial stewardship pharmacist:  []  Elenor Quinones, Pharm.D. []  Heide Guile, Pharm.D., BCPS []  Parks Neptune, Pharm.D. []  Alycia Rossetti, Pharm.D., BCPS []  [place], Pharm.D., BCPS, AAHIVP []  Legrand Como, Pharm.D., BCPS, AAHIVP []  Cassie Stewart, Pharm.D. []  Stephens November, Pharm.D.  Positive urine culture Treated with Sulfamethoxazole-Trimethoprim, organism sensitive to the same and no further patient follow-up is required at this time.  Genia Del 01/03/2016, 9:05 AM

## 2016-01-05 LAB — CULTURE, BLOOD (ROUTINE X 2)
Culture: NO GROWTH
Culture: NO GROWTH

## 2016-05-29 ENCOUNTER — Encounter (HOSPITAL_BASED_OUTPATIENT_CLINIC_OR_DEPARTMENT_OTHER): Payer: Self-pay | Admitting: Emergency Medicine

## 2016-05-29 ENCOUNTER — Emergency Department (HOSPITAL_BASED_OUTPATIENT_CLINIC_OR_DEPARTMENT_OTHER): Payer: BLUE CROSS/BLUE SHIELD

## 2016-05-29 ENCOUNTER — Emergency Department (HOSPITAL_BASED_OUTPATIENT_CLINIC_OR_DEPARTMENT_OTHER)
Admission: EM | Admit: 2016-05-29 | Discharge: 2016-05-29 | Discharge status: Against Medical Advice | Payer: BLUE CROSS/BLUE SHIELD | Attending: Physician Assistant | Admitting: Physician Assistant

## 2016-05-29 DIAGNOSIS — Z87891 Personal history of nicotine dependence: Secondary | ICD-10-CM | POA: Insufficient documentation

## 2016-05-29 DIAGNOSIS — J111 Influenza due to unidentified influenza virus with other respiratory manifestations: Secondary | ICD-10-CM | POA: Insufficient documentation

## 2016-05-29 DIAGNOSIS — I10 Essential (primary) hypertension: Secondary | ICD-10-CM | POA: Insufficient documentation

## 2016-05-29 DIAGNOSIS — Z79899 Other long term (current) drug therapy: Secondary | ICD-10-CM | POA: Diagnosis not present

## 2016-05-29 DIAGNOSIS — J449 Chronic obstructive pulmonary disease, unspecified: Secondary | ICD-10-CM | POA: Diagnosis not present

## 2016-05-29 DIAGNOSIS — J45909 Unspecified asthma, uncomplicated: Secondary | ICD-10-CM | POA: Diagnosis not present

## 2016-05-29 DIAGNOSIS — R509 Fever, unspecified: Secondary | ICD-10-CM | POA: Diagnosis present

## 2016-05-29 LAB — CBC WITH DIFFERENTIAL/PLATELET
Basophils Absolute: 0 10*3/uL (ref 0.0–0.1)
Basophils Relative: 0 %
Eosinophils Absolute: 0 10*3/uL (ref 0.0–0.7)
Eosinophils Relative: 0 %
HCT: 36.8 % (ref 36.0–46.0)
Hemoglobin: 12.8 g/dL (ref 12.0–15.0)
Lymphocytes Relative: 12 %
Lymphs Abs: 1 10*3/uL (ref 0.7–4.0)
MCH: 29.5 pg (ref 26.0–34.0)
MCHC: 34.8 g/dL (ref 30.0–36.0)
MCV: 84.8 fL (ref 78.0–100.0)
Monocytes Absolute: 0.7 10*3/uL (ref 0.1–1.0)
Monocytes Relative: 9 %
Neutro Abs: 6.3 10*3/uL (ref 1.7–7.7)
Neutrophils Relative %: 79 %
Platelets: 345 10*3/uL (ref 150–400)
RBC: 4.34 MIL/uL (ref 3.87–5.11)
RDW: 13.3 % (ref 11.5–15.5)
WBC: 8 10*3/uL (ref 4.0–10.5)

## 2016-05-29 LAB — URINALYSIS, ROUTINE W REFLEX MICROSCOPIC
Bilirubin Urine: NEGATIVE
Glucose, UA: NEGATIVE mg/dL
Ketones, ur: NEGATIVE mg/dL
Leukocytes, UA: NEGATIVE
Nitrite: NEGATIVE
Protein, ur: NEGATIVE mg/dL
Specific Gravity, Urine: 1.006 (ref 1.005–1.030)
pH: 6 (ref 5.0–8.0)

## 2016-05-29 LAB — COMPREHENSIVE METABOLIC PANEL
ALT: 23 U/L (ref 14–54)
AST: 31 U/L (ref 15–41)
Albumin: 3.9 g/dL (ref 3.5–5.0)
Alkaline Phosphatase: 83 U/L (ref 38–126)
Anion gap: 9 (ref 5–15)
BUN: 8 mg/dL (ref 6–20)
CO2: 28 mmol/L (ref 22–32)
Calcium: 8.8 mg/dL — ABNORMAL LOW (ref 8.9–10.3)
Chloride: 92 mmol/L — ABNORMAL LOW (ref 101–111)
Creatinine, Ser: 0.62 mg/dL (ref 0.44–1.00)
GFR calc Af Amer: >60 mL/min (ref >60)
GFR calc non Af Amer: >60 mL/min (ref >60)
Glucose, Bld: 79 mg/dL (ref 65–99)
Potassium: 3 mmol/L — ABNORMAL LOW (ref 3.5–5.1)
Sodium: 129 mmol/L — ABNORMAL LOW (ref 135–145)
Total Bilirubin: 0.5 mg/dL (ref 0.3–1.2)
Total Protein: 7.1 g/dL (ref 6.5–8.1)

## 2016-05-29 LAB — URINALYSIS, MICROSCOPIC (REFLEX)

## 2016-05-29 MED ORDER — POTASSIUM CHLORIDE CRYS ER 20 MEQ PO TBCR
40.0000 meq | EXTENDED_RELEASE_TABLET | Freq: Once | ORAL | Status: AC
  Administered 2016-05-29: 40 meq via ORAL
  Filled 2016-05-29: qty 2

## 2016-05-29 MED ORDER — SODIUM CHLORIDE 0.9 % IV BOLUS (SEPSIS)
1000.0000 mL | Freq: Once | INTRAVENOUS | Status: AC
  Administered 2016-05-29: 1000 mL via INTRAVENOUS

## 2016-05-29 MED ORDER — IBUPROFEN 800 MG PO TABS
800.0000 mg | ORAL_TABLET | Freq: Once | ORAL | Status: AC
  Administered 2016-05-29: 800 mg via ORAL
  Filled 2016-05-29: qty 1

## 2016-05-29 MED ORDER — IPRATROPIUM-ALBUTEROL 0.5-2.5 (3) MG/3ML IN SOLN
3.0000 mL | Freq: Once | RESPIRATORY_TRACT | Status: AC
  Administered 2016-05-29: 3 mL via RESPIRATORY_TRACT
  Filled 2016-05-29: qty 3

## 2016-05-29 NOTE — ED Provider Notes (Signed)
[facility] DEPT [facility] Provider Note   CSN: [id number] Arrival date & time: 05/29/16  [id number]     History   Chief Complaint Chief Complaint  Patient presents with  . Back Pain    HPI Monica Scott is a 61 y.o. female.  HPI   Patient is a 61 year old female presenting with fevers, body aches, back pain bilaterally. Patient's concerned about kidney infection. Patient self caths due to neurogenic bladder. Patient has noticed the symptoms for the last 2-3 days. Patient had flu shot. Patient has her normal respiratory symptoms from COPD.   medical history significant for COPD, neurogenic bladder, hypertension hyperlipidemia and peptic ulcer disease.  Past Medical History:  Diagnosis Date  . Asthma   . Bladder disease   . COPD (chronic obstructive pulmonary disease) ([place])   . Depression   . Hyperlipidemia   . Hypertension   . Peptic ulcer     There are no active problems to display for this patient.   Past Surgical History:  Procedure Laterality Date  . ABDOMINAL HYSTERECTOMY      OB History    No data available       Home Medications    Prior to Admission medications   Medication Sig Start Date End Date Taking? Authorizing Provider  albuterol (PROVENTIL HFA;VENTOLIN HFA) 108 (90 BASE) MCG/ACT inhaler Inhale 2 puffs into the lungs every 4 (four) hours as needed for wheezing. 09/08/12  Yes Liam Graham, PA-C  albuterol (PROVENTIL) (2.5 MG/3ML) 0.083% nebulizer solution Take 2.5 mg by nebulization 3 (three) times daily.   Yes Historical Provider, MD  Aspirin-Salicylamide-Caffeine (BC HEADACHE POWDER PO) Take 1 Package by mouth daily as needed (pain).   Yes Historical Provider, MD  budesonide (PULMICORT) 1 MG/2ML nebulizer solution Take 2 mLs (1 mg total) by nebulization daily. 11/03/15  Yes Malvin Johns, MD  Fluoxetine HCl, PMDD, 20 MG TABS Take 1 tablet (20 mg total) by mouth once. Patient taking differently: Take 40 mg by mouth daily.  12/21/14  Yes Nehemiah Settle, NP  gabapentin (NEURONTIN) 400 MG capsule Take 400 mg by mouth daily.   Yes Historical Provider, MD  lisinopril-hydrochlorothiazide (PRINZIDE,ZESTORETIC) 10-12.5 MG per tablet Take 1 tablet by mouth daily. 12/21/14  Yes Nehemiah Settle, NP  tiotropium (SPIRIVA) 18 MCG inhalation capsule Place 18 mcg into inhaler and inhale daily.   Yes Historical Provider, MD  traZODone (DESYREL) 50 MG tablet Take 1 tablet (50 mg total) by mouth at bedtime. Patient taking differently: Take 100 mg by mouth at bedtime.  12/21/14  Yes Nehemiah Settle, NP  diclofenac sodium (VOLTAREN) 1 % GEL Apply 4 g topically 4 (four) times daily as needed (for pain).    Historical Provider, MD    Family History No family history on file.  Social History Social History  Substance Use Topics  . Smoking status: Former Research scientist (life sciences)  . Smokeless tobacco: Never Used  . Alcohol use No     Comment: Former ETOH     Allergies   Patient has no known allergies.   Review of Systems Review of Systems  Constitutional: Positive for chills, fatigue and fever. Negative for activity change.  Respiratory: Positive for cough. Negative for shortness of breath.   Cardiovascular: Negative for chest pain.  Gastrointestinal: Negative for abdominal pain.  Musculoskeletal: Positive for back pain.     Physical Exam Updated Vital Signs BP 129/77 (BP Location: Left Arm)   Pulse 78   Temp 98.6 F (37 C) (Oral)  Resp 16   Ht 4\' 11"  (1.499 m)   Wt 135 lb (61.2 kg)   SpO2 92%   BMI 27.27 kg/m   Physical Exam  Constitutional: She is oriented to person, place, and time. She appears well-developed and well-nourished.  HENT:  Head: Normocephalic and atraumatic.  Eyes: Right eye exhibits no discharge.  Cardiovascular: Normal rate, regular rhythm and normal heart sounds.   No murmur heard. Pulmonary/Chest: Effort normal. She has wheezes. She has no rales.  Abdominal: Soft. She exhibits no distension. There is no tenderness.    Neurological: She is oriented to person, place, and time.  Skin: Skin is warm and dry. She is not diaphoretic.  Psychiatric: She has a normal mood and affect.  Nursing note and vitals reviewed.    ED Treatments / Results  Labs (all labs ordered are listed, but only abnormal results are displayed) Labs Reviewed  URINALYSIS, ROUTINE W REFLEX MICROSCOPIC - Abnormal; Notable for the following:       Result Value   Hgb urine dipstick TRACE (*)    All other components within normal limits  COMPREHENSIVE METABOLIC PANEL - Abnormal; Notable for the following:    Sodium 129 (*)    Potassium 3.0 (*)    Chloride 92 (*)    Calcium 8.8 (*)    All other components within normal limits  URINALYSIS, MICROSCOPIC (REFLEX) - Abnormal; Notable for the following:    Bacteria, UA RARE (*)    Squamous Epithelial / LPF 0-5 (*)    All other components within normal limits  URINE CULTURE  CBC WITH DIFFERENTIAL/PLATELET    EKG  EKG Interpretation None       Radiology Dg Chest 2 View  Result Date: 05/29/2016 CLINICAL DATA:  61 year-old female c/o SOB and cough x 3 weeks. Hx of COPD, and asthma EXAM: CHEST  2 VIEW COMPARISON:  11/03/2015 FINDINGS: Lungs are hyperinflated. Scarring identified at the lung bases. No focal consolidations or pleural effusions. No pulmonary edema. IMPRESSION: No active cardiopulmonary disease. Electronically Signed   By: Nolon Nations M.D.   On: 05/29/2016 09:03   Ct Renal Stone Study  Result Date: 05/29/2016 CLINICAL DATA:  Pt complains of bilateral flank pain, L>R, hx prolapsed bladder, denies hematuria, c/o nausea and fever Pt self-catheterizes for bladder emptying 5-6x daily. EXAM: CT ABDOMEN AND PELVIS WITHOUT CONTRAST TECHNIQUE: Multidetector CT imaging of the abdomen and pelvis was performed following the standard protocol without IV contrast. COMPARISON:  None. FINDINGS: Lower chest: There is minimal atelectasis in scarring at the right lung base. Heart size is  normal. No imaged pericardial effusion or significant coronary artery calcifications. Hepatobiliary: No focal liver abnormality is seen. No gallstones, gallbladder wall thickening, or biliary dilatation. Pancreas: Unremarkable. No pancreatic ductal dilatation or surrounding inflammatory changes. Spleen: Normal in size without focal abnormality. Adrenals/Urinary Tract: Adrenal glands are unremarkable. Kidneys are normal, without renal calculi, focal lesion, or hydronephrosis. Bladder is unremarkable. Stomach/Bowel: The stomach and small bowel loops are normal in appearance. There scattered colonic diverticula. No acute diverticulitis. The appendix is well seen and has a normal appearance. Moderate stool burden. Vascular/Lymphatic: There is dense atherosclerotic calcification of the abdominal aorta not associated with aneurysm. No retroperitoneal or mesenteric adenopathy. Reproductive: Status post hysterectomy.  No adnexal mass. Other: None Musculoskeletal: Degenerative changes are seen in lumbar spine, primarily at L5-S1. Scoliosis of the thoracolumbar spine, convex to the right. IMPRESSION: 1. No evidence for urinary tract calculi or obstruction. 2. Colonic diverticulosis without acute  diverticulitis. 3. Moderate stool burden. 4.  Aortic atherosclerosis. 5. Hysterectomy. 6. Scoliosis and mild degenerative changes in the spine. Electronically Signed   By: Nolon Nations M.D.   On: 05/29/2016 10:02    Procedures Procedures (including critical care time)  Medications Ordered in ED Medications  sodium chloride 0.9 % bolus 1,000 mL (0 mLs Intravenous Stopped 05/29/16 1059)  ipratropium-albuterol (DUONEB) 0.5-2.5 (3) MG/3ML nebulizer solution 3 mL (3 mLs Nebulization Given 05/29/16 0850)  sodium chloride 0.9 % bolus 1,000 mL (0 mLs Intravenous Stopped 05/29/16 1317)  potassium chloride SA (K-DUR,KLOR-CON) CR tablet 40 mEq (40 mEq Oral Given 05/29/16 1002)  ibuprofen (ADVIL,MOTRIN) tablet 800 mg (800 mg Oral Given  05/29/16 1144)     Initial Impression / Assessment and Plan / ED Course  I have reviewed the triage vital signs and the nursing notes.  Pertinent labs & imaging results that were available during my care of the patient were reviewed by me and considered in my medical decision making (see chart for details).    Patient is a 61 year old female self caths presenting today with fever, body aches, back pain. Concern for pyelonephritis given history of self cath back pain with fevers. We'll run urine, get chest x-ray, labs.  Urine and CT shows no reason for her symtpoms of back paibn and chills.  This is likely the flu. Discussed with patient and discussed getting another liter of fluid and retesting electrolyes (likely hyponatremic nad low K from anti htn meds)  Patient left before treatment could be completed. She didn;t alert staff.   Final Clinical Impressions(s) / ED Diagnoses   Final diagnoses:  None    New Prescriptions Discharge Medication List as of 05/29/2016  1:18 PM       [place], MD 05/30/16 [id number]

## 2016-05-29 NOTE — ED Notes (Signed)
Pt transported to radiology for CT study now.

## 2016-05-29 NOTE — ED Notes (Signed)
Dr Thomasene Lot in room with pt now.

## 2016-05-29 NOTE — ED Triage Notes (Signed)
Pt reports bilateral lower back pain for past few days.  States it feels similar to kidney infection she has had in past.  Pt reports fever 2 days ago of 101.  Pt states she has not checked her temp again since.  Pt self-catheterizes for bladder emptying 5-6x daily.

## 2016-05-29 NOTE — ED Notes (Signed)
Pt not in room at 1pm when this RN received report. PIV laying on bed.

## 2016-05-29 NOTE — ED Notes (Signed)
IV NS continues to infuse without complication.

## 2016-05-29 NOTE — ED Notes (Signed)
Pt ambulatory to and from restroom without assistance, in NAD.

## 2016-05-30 LAB — URINE CULTURE: Culture: <10000 — AB

## 2016-07-08 ENCOUNTER — Encounter (HOSPITAL_BASED_OUTPATIENT_CLINIC_OR_DEPARTMENT_OTHER): Payer: Self-pay | Admitting: *Deleted

## 2016-07-08 ENCOUNTER — Emergency Department (HOSPITAL_BASED_OUTPATIENT_CLINIC_OR_DEPARTMENT_OTHER)
Admission: EM | Admit: 2016-07-08 | Discharge: 2016-07-08 | Disposition: A | Discharge status: Home / Self Care | Payer: BLUE CROSS/BLUE SHIELD | Attending: Emergency Medicine | Admitting: Emergency Medicine

## 2016-07-08 DIAGNOSIS — N3000 Acute cystitis without hematuria: Secondary | ICD-10-CM

## 2016-07-08 DIAGNOSIS — R05 Cough: Secondary | ICD-10-CM | POA: Diagnosis not present

## 2016-07-08 DIAGNOSIS — J45909 Unspecified asthma, uncomplicated: Secondary | ICD-10-CM | POA: Diagnosis not present

## 2016-07-08 DIAGNOSIS — I10 Essential (primary) hypertension: Secondary | ICD-10-CM | POA: Diagnosis not present

## 2016-07-08 DIAGNOSIS — M549 Dorsalgia, unspecified: Secondary | ICD-10-CM | POA: Diagnosis present

## 2016-07-08 DIAGNOSIS — J449 Chronic obstructive pulmonary disease, unspecified: Secondary | ICD-10-CM | POA: Diagnosis not present

## 2016-07-08 DIAGNOSIS — Z87891 Personal history of nicotine dependence: Secondary | ICD-10-CM | POA: Insufficient documentation

## 2016-07-08 DIAGNOSIS — Z79899 Other long term (current) drug therapy: Secondary | ICD-10-CM | POA: Diagnosis not present

## 2016-07-08 LAB — BASIC METABOLIC PANEL
Anion gap: 8 (ref 5–15)
BUN: 12 mg/dL (ref 6–20)
CO2: 27 mmol/L (ref 22–32)
Calcium: 8.4 mg/dL — ABNORMAL LOW (ref 8.9–10.3)
Chloride: 100 mmol/L — ABNORMAL LOW (ref 101–111)
Creatinine, Ser: 0.63 mg/dL (ref 0.44–1.00)
GFR calc Af Amer: >60 mL/min (ref >60)
GFR calc non Af Amer: >60 mL/min (ref >60)
Glucose, Bld: 100 mg/dL — ABNORMAL HIGH (ref 65–99)
Potassium: 3.6 mmol/L (ref 3.5–5.1)
Sodium: 135 mmol/L (ref 135–145)

## 2016-07-08 LAB — URINALYSIS, ROUTINE W REFLEX MICROSCOPIC
Bilirubin Urine: NEGATIVE
Glucose, UA: NEGATIVE mg/dL
Ketones, ur: NEGATIVE mg/dL
Nitrite: POSITIVE — AB
Protein, ur: NEGATIVE mg/dL
Specific Gravity, Urine: 1.008 (ref 1.005–1.030)
pH: 6 (ref 5.0–8.0)

## 2016-07-08 LAB — URINALYSIS, MICROSCOPIC (REFLEX)

## 2016-07-08 LAB — CBC WITH DIFFERENTIAL/PLATELET
Basophils Absolute: 0.1 10*3/uL (ref 0.0–0.1)
Basophils Relative: 1 %
Eosinophils Absolute: 0.1 10*3/uL (ref 0.0–0.7)
Eosinophils Relative: 2 %
HCT: 31.7 % — ABNORMAL LOW (ref 36.0–46.0)
Hemoglobin: 10.5 g/dL — ABNORMAL LOW (ref 12.0–15.0)
Lymphocytes Relative: 23 %
Lymphs Abs: 1.5 10*3/uL (ref 0.7–4.0)
MCH: 28.8 pg (ref 26.0–34.0)
MCHC: 33.1 g/dL (ref 30.0–36.0)
MCV: 87.1 fL (ref 78.0–100.0)
Monocytes Absolute: 0.7 10*3/uL (ref 0.1–1.0)
Monocytes Relative: 10 %
Neutro Abs: 4.1 10*3/uL (ref 1.7–7.7)
Neutrophils Relative %: 64 %
Platelets: 491 10*3/uL — ABNORMAL HIGH (ref 150–400)
RBC: 3.64 MIL/uL — ABNORMAL LOW (ref 3.87–5.11)
RDW: 14.3 % (ref 11.5–15.5)
WBC: 6.5 10*3/uL (ref 4.0–10.5)

## 2016-07-08 MED ORDER — CEPHALEXIN 500 MG PO CAPS: 500.0000 mg | ORAL_CAPSULE | Freq: Three times a day (TID) | ORAL | 0 refills | Status: AC

## 2016-07-08 MED FILL — CEPHALEXIN 500 MG CAPSULE: 500 | 10 days supply | Qty: 30 | Fill #0

## 2016-07-08 NOTE — ED Triage Notes (Signed)
Pt reports low back pain x yesterday, earlier this week she had fevers up to 101, chills, aches. Pt states she self caths and wonders if she has a uti. Pt amb to bathroom to self cath for urine sample.

## 2016-07-08 NOTE — ED Provider Notes (Signed)
[facility] Provider Note   CSN: [id number] Arrival date & time: 07/08/16  3212     History   Chief Complaint Chief Complaint  Patient presents with  . Back Pain    HPI Monica Scott is a 61 y.o. female presenting with mid back pain and dark urine wondering if she may be having a UTI. She reports nausea, fever of 101-102, chills generalized body aches and excessive thirst 3 days ago which now have subsided. She is still experiencing decreased appetite, some nausea and mid back pain. At this time she denies fever, chills, vomiting, diarrhea, dysuria, abdominal pain, blood in her stool, hematuria, although she reports dark urine unsure if there is blood in it.  HPI  Past Medical History:  Diagnosis Date  . Asthma   . Bladder disease   . COPD (chronic obstructive pulmonary disease) ([place])   . Depression   . Hyperlipidemia   . Hypertension   . Peptic ulcer     There are no active problems to display for this patient.   Past Surgical History:  Procedure Laterality Date  . ABDOMINAL HYSTERECTOMY      OB History    No data available       Home Medications    Prior to Admission medications   Medication Sig Start Date End Date Taking? Authorizing Provider  albuterol (PROVENTIL HFA;VENTOLIN HFA) 108 (90 BASE) MCG/ACT inhaler Inhale 2 puffs into the lungs every 4 (four) hours as needed for wheezing. 09/08/12   Liam Graham, PA-C  albuterol (PROVENTIL) (2.5 MG/3ML) 0.083% nebulizer solution Take 2.5 mg by nebulization 3 (three) times daily.    Historical Provider, MD  Aspirin-Salicylamide-Caffeine (BC HEADACHE POWDER PO) Take 1 Package by mouth daily as needed (pain).    Historical Provider, MD  budesonide (PULMICORT) 1 MG/2ML nebulizer solution Take 2 mLs (1 mg total) by nebulization daily. 11/03/15   Malvin Johns, MD  cephALEXin (KEFLEX) 500 MG capsule Take 1 capsule (500 mg total) by mouth 3 (three) times daily. 07/08/16 07/18/16  Emeline General, PA-C    diclofenac sodium (VOLTAREN) 1 % GEL Apply 4 g topically 4 (four) times daily as needed (for pain).    Historical Provider, MD  Fluoxetine HCl, PMDD, 20 MG TABS Take 1 tablet (20 mg total) by mouth once. Patient taking differently: Take 40 mg by mouth daily.  12/21/14   Nehemiah Settle, NP  gabapentin (NEURONTIN) 400 MG capsule Take 400 mg by mouth daily.    Historical Provider, MD  lisinopril-hydrochlorothiazide (PRINZIDE,ZESTORETIC) 10-12.5 MG per tablet Take 1 tablet by mouth daily. 12/21/14   Nehemiah Settle, NP  tiotropium (SPIRIVA) 18 MCG inhalation capsule Place 18 mcg into inhaler and inhale daily.    Historical Provider, MD  traZODone (DESYREL) 50 MG tablet Take 1 tablet (50 mg total) by mouth at bedtime. Patient taking differently: Take 100 mg by mouth at bedtime.  12/21/14   Nehemiah Settle, NP    Family History History reviewed. No pertinent family history.  Social History Social History  Substance Use Topics  . Smoking status: Former Research scientist (life sciences)  . Smokeless tobacco: Never Used  . Alcohol use No     Comment: Former ETOH     Allergies   Patient has no known allergies.   Review of Systems Review of Systems  Constitutional: Negative for chills and fever.       She did have fevers at home 2 days ago but those have subsided.  HENT:  Negative for congestion, ear pain and sore throat.   Eyes: Negative for pain and visual disturbance.  Respiratory: Positive for cough. Negative for chest tightness, shortness of breath, wheezing and stridor.        Chronic cough and COPD but no new productive cough or anything changed.  Cardiovascular: Negative for chest pain, palpitations and leg swelling.  Gastrointestinal: Positive for nausea. Negative for abdominal distention, abdominal pain, blood in stool, diarrhea and vomiting.  Genitourinary: Negative for dysuria, flank pain and hematuria.       Patient has been self cathing for years.  Musculoskeletal: Positive for back pain and  myalgias. Negative for arthralgias, gait problem, joint swelling, neck pain and neck stiffness.  Skin: Negative for color change, pallor and rash.  Neurological: Negative for seizures, syncope, weakness and light-headedness.     Physical Exam Updated Vital Signs BP 139/78 (BP Location: Left Arm)   Pulse 81   Temp 98.4 F (36.9 C) (Oral)   Resp 18   Ht 4\' 11"  (1.499 m)   Wt 59 kg   SpO2 97%   BMI 26.26 kg/m   Physical Exam  Constitutional: She appears well-developed and well-nourished. No distress.  Patient is afebrile, nontoxic-appearing, sitting comfortably in bed in no acute distress.  HENT:  Head: Normocephalic and atraumatic.  Eyes: Conjunctivae and EOM are normal. Right eye exhibits no discharge. Left eye exhibits no discharge. No scleral icterus.  Neck: Normal range of motion. Neck supple.  Cardiovascular: Normal rate, regular rhythm and normal heart sounds.   No murmur heard. Pulmonary/Chest: Effort normal. No respiratory distress. She has no wheezes. She has no rales. She exhibits no tenderness.  Diffusely reduced lung sounds.  Abdominal: Soft. She exhibits no distension and no mass. There is no tenderness. There is no rebound and no guarding.  Patient has CVA tenderness which is  L >R  Musculoskeletal: Normal range of motion. She exhibits no edema or deformity.  Neurological: She is alert.  Skin: Skin is warm and dry. No rash noted. She is not diaphoretic. No erythema. No pallor.  Psychiatric: She has a normal mood and affect. Her behavior is normal.  Nursing note and vitals reviewed.    ED Treatments / Results  Labs (all labs ordered are listed, but only abnormal results are displayed) Labs Reviewed  URINALYSIS, ROUTINE W REFLEX MICROSCOPIC - Abnormal; Notable for the following:       Result Value   APPearance CLOUDY (*)    Hgb urine dipstick TRACE (*)    Nitrite POSITIVE (*)    Leukocytes, UA MODERATE (*)    All other components within normal limits  CBC  WITH DIFFERENTIAL/PLATELET - Abnormal; Notable for the following:    RBC 3.64 (*)    Hemoglobin 10.5 (*)    HCT 31.7 (*)    Platelets 491 (*)    All other components within normal limits  BASIC METABOLIC PANEL - Abnormal; Notable for the following:    Chloride 100 (*)    Glucose, Bld 100 (*)    Calcium 8.4 (*)    All other components within normal limits  URINALYSIS, MICROSCOPIC (REFLEX) - Abnormal; Notable for the following:    Bacteria, UA MANY (*)    Squamous Epithelial / LPF 0-5 (*)    All other components within normal limits  URINE CULTURE   Hemoglobin  Date Value Ref Range Status  07/08/2016 10.5 (L) 12.0 - 15.0 g/dL Final  05/29/2016 12.8 12.0 - 15.0 g/dL Final  12/31/2015  12.2 12.0 - 15.0 g/dL Final  12/31/2015 11.5 (L) 12.0 - 15.0 g/dL Final    EKG  EKG Interpretation None       Radiology No results found.  Procedures Procedures (including critical care time)  Medications Ordered in ED Medications - No data to display   Initial Impression / Assessment and Plan / ED Course  I have reviewed the triage vital signs and the nursing notes.  Pertinent labs & imaging results that were available during my care of the patient were reviewed by me and considered in my medical decision making (see chart for details).     Patient presents with CVA tenderness and history of fever, chills, generalized myalgias 2 days ago. She is overall improved other than CVA pain. selfcath urine. Exam is otherwise unremarkable, patient is afebrile nontoxic appearing.  Obtained basic labs and urine analysis which revealed UTI.   Discharge home with Keflex and close follow-up with PCP. Patient was well-appearing, afebrile, nontoxic and ready go home. Discussed strict return precautions and advised to return to the emergency department if experiencing any new or worsening symptoms. Instructions were understood and patient agreed with discharge plan.  Final Clinical Impressions(s) /  ED Diagnoses   Final diagnoses:  Acute cystitis without hematuria    New Prescriptions New Prescriptions   CEPHALEXIN (KEFLEX) 500 MG CAPSULE    Take 1 capsule (500 mg total) by mouth 3 (three) times daily.     Emeline General, PA-C 07/08/16 [place], MD 07/09/16 [phone]

## 2016-07-08 NOTE — Discharge Instructions (Signed)
As discussed, please stay well hydrated to keep your urine clear.  Follow-up with your primary care provider. Return to the emergency department if you experience worsening pain, fever, chills, nausea, vomiting or any other new concerning symptoms.

## 2016-07-10 LAB — URINE CULTURE: Culture: >=100000 — AB

## 2016-07-11 ENCOUNTER — Telehealth: Payer: Self-pay

## 2016-07-11 NOTE — Telephone Encounter (Signed)
Post ED Visit - Positive Culture Follow-up  Culture report reviewed by antimicrobial stewardship pharmacist:  []  Elenor Quinones, Pharm.D. []  Heide Guile, Pharm.D., BCPS AQ-ID []  Parks Neptune, Pharm.D., BCPS []  Alycia Rossetti, Pharm.D., BCPS []  T[place]il, Pharm.D., BCPS, AAHIVP []  Legrand Como, Pharm.D., BCPS, AAHIVP []  Salome Arnt, PharmD, BCPS [x]  Dimitri Ped, PharmD, BCPS []  Vincenza Hews, PharmD, BCPS  Positive urine culture Treated with Cephalexin, organism sensitive to the same and no further patient follow-up is required at this time.  Genia Del 07/11/2016, 9:31 AM

## 2016-11-09 ENCOUNTER — Emergency Department (HOSPITAL_COMMUNITY)
Admission: EM | Admit: 2016-11-09 | Discharge: 2016-11-09 | Disposition: A | Discharge status: Home / Self Care | Payer: BLUE CROSS/BLUE SHIELD | Attending: Physician Assistant | Admitting: Physician Assistant

## 2016-11-09 ENCOUNTER — Encounter (HOSPITAL_COMMUNITY): Payer: Self-pay

## 2016-11-09 DIAGNOSIS — F329 Major depressive disorder, single episode, unspecified: Secondary | ICD-10-CM | POA: Diagnosis not present

## 2016-11-09 DIAGNOSIS — Z79899 Other long term (current) drug therapy: Secondary | ICD-10-CM | POA: Diagnosis not present

## 2016-11-09 DIAGNOSIS — H05011 Cellulitis of right orbit: Secondary | ICD-10-CM | POA: Diagnosis not present

## 2016-11-09 DIAGNOSIS — J449 Chronic obstructive pulmonary disease, unspecified: Secondary | ICD-10-CM | POA: Diagnosis not present

## 2016-11-09 DIAGNOSIS — K0889 Other specified disorders of teeth and supporting structures: Secondary | ICD-10-CM | POA: Diagnosis not present

## 2016-11-09 DIAGNOSIS — I10 Essential (primary) hypertension: Secondary | ICD-10-CM | POA: Diagnosis not present

## 2016-11-09 DIAGNOSIS — J45909 Unspecified asthma, uncomplicated: Secondary | ICD-10-CM | POA: Diagnosis not present

## 2016-11-09 DIAGNOSIS — L03213 Periorbital cellulitis: Secondary | ICD-10-CM

## 2016-11-09 DIAGNOSIS — Z87891 Personal history of nicotine dependence: Secondary | ICD-10-CM | POA: Insufficient documentation

## 2016-11-09 MED ORDER — CLINDAMYCIN HCL 150 MG PO CAPS: 450.0000 mg | ORAL_CAPSULE | Freq: Three times a day (TID) | ORAL | 0 refills | Status: AC

## 2016-11-09 MED ORDER — ACETAMINOPHEN 325 MG PO TABS
650.0000 mg | ORAL_TABLET | Freq: Once | ORAL | Status: AC
  Administered 2016-11-09: 650 mg via ORAL
  Filled 2016-11-09: qty 2

## 2016-11-09 NOTE — ED Triage Notes (Signed)
Per Pt, Pt started to notice tooth pain in her right upper mandible yesterday. This morning patient woke up with some swelling to her right face. Denies fevers.

## 2016-11-09 NOTE — Discharge Instructions (Signed)
Take antibiotics as directed. Please take all of your antibiotics until finished. You may experience some GI upset from this antibiotic. You can take probiotics to assist with that.   You can take Tylenol or Ibuprofen as directed for pain.  You can apply warm compresses to the area.   The exam and treatment you received today has been provided on an emergency basis only. This is not a substitute for complete medical or dental care. If your problem worsens or new symptoms (problems) appear, and you are unable to arrange prompt follow-up care with your dentist, call or return to this location. If you do not have a dentist, please follow-up with one on the list provided  CALL YOUR DENTIST OR RETURN IMMEDIATELY IF you develop a fever, rash, difficulty breathing or swallowing, neck or facial swelling, vision changes, or other potentially serious concerns.

## 2016-11-09 NOTE — ED Provider Notes (Signed)
[facility] DEPT Provider Note   CSN: [id number] Arrival date & time: 11/09/16  8182     History   Chief Complaint Chief Complaint  Patient presents with  . Dental Pain    HPI Monica Scott is a 61 y.o. female Presents with 2 days of right-sided dental pain. Patient states that she has been taking ibuprofen with no improvement in symptoms. Patient states that she is still able to tolerate by mouth but states that her pain is worsened with eating hard foods. Patient denies any difficulty swelling, drooling. Patient has not followed by dentist. Patient states that this morning she noticed some mild swelling to the right side of her face, prompting ED visit. Patient denies any trauma/injury to the face. Patient denies any fevers, difficulty breathing, difficulty swallowing, chest pain, vision changes, eye pain, abdominal pain, nausea/vomiting.  The history is provided by the patient.    Past Medical History:  Diagnosis Date  . Asthma   . Bladder disease   . COPD (chronic obstructive pulmonary disease) ([place])   . Depression   . Hyperlipidemia   . Hypertension   . Peptic ulcer     There are no active problems to display for this patient.   Past Surgical History:  Procedure Laterality Date  . ABDOMINAL HYSTERECTOMY      OB History    No data available       Home Medications    Prior to Admission medications   Medication Sig Start Date End Date Taking? Authorizing Provider  albuterol (PROVENTIL HFA;VENTOLIN HFA) 108 (90 BASE) MCG/ACT inhaler Inhale 2 puffs into the lungs every 4 (four) hours as needed for wheezing. 09/08/12   Baker, Freeman Caldron, PA-C  albuterol (PROVENTIL) (2.5 MG/3ML) 0.083% nebulizer solution Take 2.5 mg by nebulization 3 (three) times daily.    [provider]  Aspirin-Salicylamide-Caffeine (BC HEADACHE POWDER PO) Take 1 Package by mouth daily as needed (pain).    [provider]  budesonide (PULMICORT) 1 MG/2ML nebulizer solution  Take 2 mLs (1 mg total) by nebulization daily. 11/03/15   Malvin Johns, MD  clindamycin (CLEOCIN) 150 MG capsule Take 3 capsules (450 mg total) by mouth 3 (three) times daily. 11/09/16 11/16/16  Volanda Napoleon, PA-C  diclofenac sodium (VOLTAREN) 1 % GEL Apply 4 g topically 4 (four) times daily as needed (for pain).    [provider]  Fluoxetine HCl, PMDD, 20 MG TABS Take 1 tablet (20 mg total) by mouth once. Patient taking differently: Take 40 mg by mouth daily.  12/21/14   Nehemiah Settle, NP  gabapentin (NEURONTIN) 400 MG capsule Take 400 mg by mouth daily.    [provider]  lisinopril-hydrochlorothiazide (PRINZIDE,ZESTORETIC) 10-12.5 MG per tablet Take 1 tablet by mouth daily. 12/21/14   Nehemiah Settle, NP  tiotropium (SPIRIVA) 18 MCG inhalation capsule Place 18 mcg into inhaler and inhale daily.    [provider]  traZODone (DESYREL) 50 MG tablet Take 1 tablet (50 mg total) by mouth at bedtime. Patient taking differently: Take 100 mg by mouth at bedtime.  12/21/14   Nehemiah Settle, NP    Family History No family history on file.  Social History Social History  Substance Use Topics  . Smoking status: Former Research scientist (life sciences)  . Smokeless tobacco: Never Used  . Alcohol use No     Comment: Former ETOH     Allergies   Patient has no known allergies.   Review of Systems Review of Systems  Constitutional: Negative for chills and fever.  HENT: Positive for dental problem and facial swelling. Negative for drooling and trouble swallowing.   Eyes: Negative for visual disturbance.  Respiratory: Negative for shortness of breath.   Cardiovascular: Negative for chest pain.  Gastrointestinal: Negative for abdominal pain, nausea and vomiting.     Physical Exam Updated Vital Signs BP (!) 159/92 (BP Location: Left Arm)   Pulse 75   Temp 98.9 F (37.2 C) (Oral)   Resp 17   Ht 4\' 11"  (1.499 m)   Wt 63.5 kg (140 lb)   SpO2 94%   BMI 28.28 kg/m    Physical Exam  Constitutional: She is oriented to person, place, and time. She appears well-developed and well-nourished.  Sitting comfortably on examination table  HENT:  Head: Normocephalic and atraumatic.  Mouth/Throat: Uvula is midline, oropharynx is clear and moist and mucous membranes are normal. No trismus in the jaw. Abnormal dentition. Dental caries present. No dental abscesses.  Multiple scattered dental caries throughout. Diffuse gingival erythema and swelling throughout with no localized area of erythema or swelling. Patient is missing majority of her teeth. On the one remaining tooth that is meaning on the back upper left side, she has evidence of dental caries. No evidence of abscess. No palpable mass or fluctuance noted. Uvula is midline. No evidence of trismus. No evidence of peritonsillar abscess. Patient had some very mild swelling/redness at the very inferior border of the periorbital region on the right side. No neck swelling.  Eyes: Pupils are equal, round, and reactive to light. Conjunctivae, EOM and lids are normal. Right conjunctiva is not injected. Right conjunctiva has no hemorrhage. Left conjunctiva is not injected.  No conjunctival injection bilaterally. EOMs intact without difficulty. PERRL  Neck: Full passive range of motion without pain.  Cardiovascular: Normal rate, regular rhythm, normal heart sounds and normal pulses.  Exam reveals no gallop and no friction rub.   No murmur heard. Pulmonary/Chest: Effort normal and breath sounds normal.  No evidence of respiratory distress. Able to speak in full sentences without difficulty.  Abdominal: Soft. Normal appearance. There is no tenderness. There is no rigidity and no guarding.  Musculoskeletal: Normal range of motion.  Neurological: She is alert and oriented to person, place, and time.  Skin: Skin is warm and dry. Capillary refill takes less than 2 seconds.  Psychiatric: She has a normal mood and affect. Her speech  is normal.  Nursing note and vitals reviewed.    ED Treatments / Results  Labs (all labs ordered are listed, but only abnormal results are displayed) Labs Reviewed - No data to display  EKG  EKG Interpretation None       Radiology No results found.  Procedures Procedures (including critical care time)  Medications Ordered in ED Medications  acetaminophen (TYLENOL) tablet 650 mg (650 mg Oral Given 11/09/16 0941)     Initial Impression / Assessment and Plan / ED Course  I have reviewed the triage vital signs and the nursing notes.  Pertinent labs & imaging results that were available during my care of the patient were reviewed by me and considered in my medical decision making (see chart for details).     61 year old female who presents with 2 days of right-sided dental pain. Woke up this morning with some right-sided facial swelling. Patient is afebrile, non-toxic appearing, sitting comfortably on examination table. Vital signs reviewed and stable. No evidence of respiratory distress. Patient with very poor dentition throughout. She has multiple teeth  missing. She has diffuse evidence of dental caries. Where patient is expressing pain in the right upper tooth, there is no evidence of dental abscess. No mass or fluctuance felt. History/physical exam are not concerning for Ludwig angina or peritonsillar abscess or orbital cellulitis. Patient also with some right-sided facial swelling just to the inferior border of the periorbital region. EOMs intact without difficult. No vision complaints, no eye pain. Concern for preseptal cellulitis. We'll plan to cover with by mouth antibiotics that covered with dental abscess and preseptal cellulitis. Patient provided a list of dental resource guide for her to follow up. Stress the importance of dental follow-up with immediately. Conservative therapies discussed. Strict return precautions discussed. Patient expresses understanding and agreement to  plan.    Final Clinical Impressions(s) / ED Diagnoses   Final diagnoses:  Pain, dental  Periorbital cellulitis of right eye    New Prescriptions Discharge Medication List as of 11/09/2016  9:30 AM    START taking these medications   Details  clindamycin (CLEOCIN) 150 MG capsule Take 3 capsules (450 mg total) by mouth 3 (three) times daily., Starting Tue 11/09/2016, Until Tue 11/16/2016, Print         Volanda Napoleon, PA-C 11/09/16 1759    Macarthur Critchley, MD 11/10/16 1040

## 2016-12-03 ENCOUNTER — Encounter (HOSPITAL_BASED_OUTPATIENT_CLINIC_OR_DEPARTMENT_OTHER): Payer: Self-pay | Admitting: *Deleted

## 2016-12-03 ENCOUNTER — Emergency Department (HOSPITAL_BASED_OUTPATIENT_CLINIC_OR_DEPARTMENT_OTHER)
Admission: EM | Admit: 2016-12-03 | Discharge: 2016-12-03 | Disposition: A | Discharge status: Home / Self Care | Payer: BLUE CROSS/BLUE SHIELD | Attending: Emergency Medicine | Admitting: Emergency Medicine

## 2016-12-03 ENCOUNTER — Emergency Department (HOSPITAL_BASED_OUTPATIENT_CLINIC_OR_DEPARTMENT_OTHER): Payer: BLUE CROSS/BLUE SHIELD

## 2016-12-03 DIAGNOSIS — J45909 Unspecified asthma, uncomplicated: Secondary | ICD-10-CM | POA: Diagnosis not present

## 2016-12-03 DIAGNOSIS — M62830 Muscle spasm of back: Secondary | ICD-10-CM | POA: Insufficient documentation

## 2016-12-03 DIAGNOSIS — I10 Essential (primary) hypertension: Secondary | ICD-10-CM | POA: Diagnosis not present

## 2016-12-03 DIAGNOSIS — Z79899 Other long term (current) drug therapy: Secondary | ICD-10-CM | POA: Diagnosis not present

## 2016-12-03 DIAGNOSIS — J449 Chronic obstructive pulmonary disease, unspecified: Secondary | ICD-10-CM | POA: Diagnosis not present

## 2016-12-03 DIAGNOSIS — M545 Low back pain: Secondary | ICD-10-CM | POA: Diagnosis present

## 2016-12-03 DIAGNOSIS — Z87891 Personal history of nicotine dependence: Secondary | ICD-10-CM | POA: Diagnosis not present

## 2016-12-03 MED ORDER — HYDROCODONE-ACETAMINOPHEN 5-325 MG PO TABS: 1.0000 | ORAL_TABLET | Freq: Four times a day (QID) | ORAL | 0 refills | Status: DC | PRN

## 2016-12-03 MED ORDER — CYCLOBENZAPRINE HCL 5 MG PO TABS: 5.0000 mg | ORAL_TABLET | Freq: Three times a day (TID) | ORAL | 0 refills | Status: DC | PRN

## 2016-12-03 MED FILL — HYDROCODON-APAP 5-325: 5-325 | 1 days supply | Qty: 5 | Fill #0

## 2016-12-03 MED FILL — CYCLOBENZAPRINE 5 MG TABLET: 5 | 3 days supply | Qty: 10 | Fill #0

## 2016-12-03 NOTE — ED Provider Notes (Signed)
[facility] DEPT [facility] Provider Note   CSN: [id number] Arrival date & time: 12/03/16  1497     History   Chief Complaint Chief Complaint  Patient presents with  . Back Pain    HPI Monica Scott is a 61 y.o. female history of COPD, hypertension, hyperlipidemia here presenting with back pain. Patient states that she's been having lower back pain for the last several days. Denies any fall or injury. Denies any radiation to the pain or any numbness or weakness. Has been taking ibuprofen with minimal relief. Denies previous spinal surgeries.   The history is provided by the patient.    Past Medical History:  Diagnosis Date  . Asthma   . Bladder disease   . COPD (chronic obstructive pulmonary disease) ([place])   . Depression   . Hyperlipidemia   . Hypertension   . Peptic ulcer     There are no active problems to display for this patient.   Past Surgical History:  Procedure Laterality Date  . ABDOMINAL HYSTERECTOMY      OB History    No data available       Home Medications    Prior to Admission medications   Medication Sig Start Date End Date Taking? Authorizing Provider  albuterol (PROVENTIL HFA;VENTOLIN HFA) 108 (90 BASE) MCG/ACT inhaler Inhale 2 puffs into the lungs every 4 (four) hours as needed for wheezing. 09/08/12  Yes Baker, Zachary H, PA-C  albuterol (PROVENTIL) (2.5 MG/3ML) 0.083% nebulizer solution Take 2.5 mg by nebulization 3 (three) times daily.   Yes [provider]  Aspirin-Salicylamide-Caffeine (BC HEADACHE POWDER PO) Take 1 Package by mouth daily as needed (pain).   Yes [provider]  budesonide (PULMICORT) 1 MG/2ML nebulizer solution Take 2 mLs (1 mg total) by nebulization daily. 11/03/15  Yes Malvin Johns, MD  Fluoxetine HCl, PMDD, 20 MG TABS Take 1 tablet (20 mg total) by mouth once. Patient taking differently: Take 40 mg by mouth daily.  12/21/14  Yes Nehemiah Settle, NP  gabapentin (NEURONTIN) 400 MG capsule Take 400 mg  by mouth daily.   Yes [provider]  lisinopril-hydrochlorothiazide (PRINZIDE,ZESTORETIC) 10-12.5 MG per tablet Take 1 tablet by mouth daily. 12/21/14  Yes Nehemiah Settle, NP  traZODone (DESYREL) 50 MG tablet Take 1 tablet (50 mg total) by mouth at bedtime. Patient taking differently: Take 100 mg by mouth at bedtime.  12/21/14  Yes Nehemiah Settle, NP  diclofenac sodium (VOLTAREN) 1 % GEL Apply 4 g topically 4 (four) times daily as needed (for pain).    [provider]  tiotropium (SPIRIVA) 18 MCG inhalation capsule Place 18 mcg into inhaler and inhale daily.    [provider]    Family History No family history on file.  Social History Social History  Substance Use Topics  . Smoking status: Former Research scientist (life sciences)  . Smokeless tobacco: Never Used  . Alcohol use No     Comment: Former ETOH     Allergies   Patient has no known allergies.   Review of Systems Review of Systems  Musculoskeletal: Positive for back pain.  All other systems reviewed and are negative.    Physical Exam Updated Vital Signs BP 137/82 (BP Location: Left Arm)   Pulse 66   Temp 97.8 F (36.6 C) (Oral)   Resp 16   Ht 4\' 11"  (1.499 m)   Wt 63.5 kg (140 lb)   SpO2 94%   BMI 28.28 kg/m   Physical Exam  Constitutional: She is oriented to person, place, and time. She appears well-developed.  HENT:  Head: Normocephalic.  Mouth/Throat: Oropharynx is clear and moist.  Eyes: Pupils are equal, round, and reactive to light. Conjunctivae and EOM are normal.  Neck: Normal range of motion. Neck supple.  Cardiovascular: Normal rate, regular rhythm and normal heart sounds.   Pulmonary/Chest: Effort normal and breath sounds normal. No respiratory distress. She has no wheezes.  Abdominal: Soft. Bowel sounds are normal.  Musculoskeletal:  Mild L SI joint vs paralumbar tenderness. No midline tenderness or deformity. Nl ROM L hip. Negative straight leg raise. No saddle anesthesia.  Neurovascular intact bilateral lower extremities. Nl gait   Neurological: She is alert and oriented to person, place, and time.  Skin: Skin is warm.  Psychiatric: She has a normal mood and affect.  Nursing note and vitals reviewed.    ED Treatments / Results  Labs (all labs ordered are listed, but only abnormal results are displayed) Labs Reviewed - No data to display  EKG  EKG Interpretation None       Radiology Dg Lumbar Spine Complete  Result Date: 12/03/2016 CLINICAL DATA:  Non-radiating lower back pain x 1 day with NKI, no previous injuries,injections or surgeries EXAM: LUMBAR SPINE - COMPLETE 4+ VIEW COMPARISON:  CT abdomen dated 05/29/2016. FINDINGS: Moderate dextroscoliosis of the lumbar spine, measuring approximately 15 degrees, stable compared to the earlier CT. There is partial sacralization of the lowest lumbar type vertebral body on the right, with associated ankylosis. Mild disc desiccation at the L1-2 and L2-3 levels, with associated endplate sclerosis, without significant disc space narrowing. No acute appearing osseous abnormality. Atherosclerotic changes seen along the walls of the infrarenal abdominal aorta. Visualized paravertebral soft tissues are otherwise unremarkable. IMPRESSION: 1. Dextroscoliosis of the lumbar spine, measuring approximately 15 degrees, stable compared to earlier CT of 05/29/2016. 2. Mild degenerative disc disease within the upper lumbar spine. No evidence of advanced degenerative change at any level. 3. No acute findings. 4. Aortic atherosclerosis. Electronically Signed   By: Franki Cabot M.D.   On: 12/03/2016 09:50    Procedures Procedures (including critical care time)  Medications Ordered in ED Medications - No data to display   Initial Impression / Assessment and Plan / ED Course  I have reviewed the triage vital signs and the nursing notes.  Pertinent labs & imaging results that were available during my care of the patient were  reviewed by me and considered in my medical decision making (see chart for details).     Monica Scott is a 61 y.o. female here with low back pain. Likely SI joint pain vs muscle strain. Well appearing. Will get xrays.   9:56 AM Xray showed scoliosis and arthritis. She requests vicodin, flexeril. I checked [place] database and she is not currently prescribed any controlled substance. Will give several pills of vicodin, flexeril.   Final Clinical Impressions(s) / ED Diagnoses   Final diagnoses:  None    New Prescriptions New Prescriptions   No medications on file     Drenda Freeze, MD 12/03/16 [phone]

## 2016-12-03 NOTE — Discharge Instructions (Signed)
Take motrin for pain.  Take flexeril for muscle spasms.   Take vicodin only for severe pain. DO NOT drive with it.   No heavy lifting for a week.   See your doctor  Return to ER if you have worse back pain, numbness, weakness, trouble walking.

## 2016-12-03 NOTE — ED Notes (Signed)
Pt directed to pharmacy to pick up rx

## 2016-12-03 NOTE — ED Triage Notes (Signed)
Pt reports low mid back pain since yesterday. Denies injury. Hx of same. Pain does not involve her legs

## 2016-12-03 NOTE — ED Notes (Signed)
Patient transported to X-ray, ambulatory

## 2017-01-18 ENCOUNTER — Encounter (HOSPITAL_BASED_OUTPATIENT_CLINIC_OR_DEPARTMENT_OTHER): Payer: Self-pay | Admitting: *Deleted

## 2017-01-18 ENCOUNTER — Emergency Department (HOSPITAL_BASED_OUTPATIENT_CLINIC_OR_DEPARTMENT_OTHER)
Admission: EM | Admit: 2017-01-18 | Discharge: 2017-01-18 | Disposition: A | Discharge status: Home / Self Care | Payer: BLUE CROSS/BLUE SHIELD | Attending: Emergency Medicine | Admitting: Emergency Medicine

## 2017-01-18 DIAGNOSIS — E876 Hypokalemia: Secondary | ICD-10-CM

## 2017-01-18 DIAGNOSIS — Z79899 Other long term (current) drug therapy: Secondary | ICD-10-CM | POA: Insufficient documentation

## 2017-01-18 DIAGNOSIS — J449 Chronic obstructive pulmonary disease, unspecified: Secondary | ICD-10-CM | POA: Insufficient documentation

## 2017-01-18 DIAGNOSIS — Z87891 Personal history of nicotine dependence: Secondary | ICD-10-CM | POA: Insufficient documentation

## 2017-01-18 DIAGNOSIS — J45909 Unspecified asthma, uncomplicated: Secondary | ICD-10-CM | POA: Insufficient documentation

## 2017-01-18 DIAGNOSIS — R399 Unspecified symptoms and signs involving the genitourinary system: Secondary | ICD-10-CM

## 2017-01-18 DIAGNOSIS — I10 Essential (primary) hypertension: Secondary | ICD-10-CM

## 2017-01-18 LAB — URINALYSIS, ROUTINE W REFLEX MICROSCOPIC
Bilirubin Urine: NEGATIVE
Glucose, UA: NEGATIVE mg/dL
Ketones, ur: NEGATIVE mg/dL
Nitrite: NEGATIVE
Protein, ur: NEGATIVE mg/dL
Specific Gravity, Urine: <1.005 — ABNORMAL LOW (ref 1.005–1.030)
pH: 6 (ref 5.0–8.0)

## 2017-01-18 LAB — CBC WITH DIFFERENTIAL/PLATELET
Basophils Absolute: 0.1 10*3/uL (ref 0.0–0.1)
Basophils Relative: 1 %
Eosinophils Absolute: 0.2 10*3/uL (ref 0.0–0.7)
Eosinophils Relative: 3 %
HCT: 36.8 % (ref 36.0–46.0)
Hemoglobin: 12.1 g/dL (ref 12.0–15.0)
Lymphocytes Relative: 37 %
Lymphs Abs: 3.1 10*3/uL (ref 0.7–4.0)
MCH: 28.9 pg (ref 26.0–34.0)
MCHC: 32.9 g/dL (ref 30.0–36.0)
MCV: 88 fL (ref 78.0–100.0)
Monocytes Absolute: 0.5 10*3/uL (ref 0.1–1.0)
Monocytes Relative: 6 %
Neutro Abs: 4.5 10*3/uL (ref 1.7–7.7)
Neutrophils Relative %: 53 %
Platelets: 380 10*3/uL (ref 150–400)
RBC: 4.18 MIL/uL (ref 3.87–5.11)
RDW: 14.6 % (ref 11.5–15.5)
WBC: 8.4 10*3/uL (ref 4.0–10.5)

## 2017-01-18 LAB — BASIC METABOLIC PANEL
Anion gap: 8 (ref 5–15)
BUN: 8 mg/dL (ref 6–20)
CO2: 27 mmol/L (ref 22–32)
Calcium: 8.9 mg/dL (ref 8.9–10.3)
Chloride: 102 mmol/L (ref 101–111)
Creatinine, Ser: 0.71 mg/dL (ref 0.44–1.00)
GFR calc Af Amer: >60 mL/min (ref >60)
GFR calc non Af Amer: >60 mL/min (ref >60)
Glucose, Bld: 105 mg/dL — ABNORMAL HIGH (ref 65–99)
Potassium: 3 mmol/L — ABNORMAL LOW (ref 3.5–5.1)
Sodium: 137 mmol/L (ref 135–145)

## 2017-01-18 LAB — URINALYSIS, MICROSCOPIC (REFLEX)

## 2017-01-18 MED ORDER — CEPHALEXIN 500 MG PO CAPS: 500.0000 mg | ORAL_CAPSULE | Freq: Three times a day (TID) | ORAL | 0 refills | Status: DC

## 2017-01-18 MED ORDER — POTASSIUM CHLORIDE CRYS ER 20 MEQ PO TBCR
40.0000 meq | EXTENDED_RELEASE_TABLET | Freq: Once | ORAL | Status: AC
  Administered 2017-01-18: 40 meq via ORAL
  Filled 2017-01-18: qty 2

## 2017-01-18 MED ORDER — LISINOPRIL 10 MG PO TABS: 10.0000 mg | ORAL_TABLET | Freq: Every day | ORAL | 0 refills | Status: DC

## 2017-01-18 MED ORDER — LISINOPRIL 10 MG PO TABS
10.0000 mg | ORAL_TABLET | Freq: Once | ORAL | Status: AC
  Administered 2017-01-18: 10 mg via ORAL
  Filled 2017-01-18: qty 1

## 2017-01-18 MED FILL — LISINOPRIL 10 MG TABLET: 10 | 30 days supply | Qty: 30 | Fill #0

## 2017-01-18 MED FILL — CEPHALEXIN 500 MG CAPSULE: 500 | 7 days supply | Qty: 21 | Fill #0

## 2017-01-18 NOTE — Discharge Instructions (Signed)
It was my pleasure taking care of you today!   Stay very well hydrated with plenty of water throughout the day. Please take antibiotic until completion. This is not a harmful side effect. Follow up with primary care physician in 1 week for recheck of ongoing symptoms.  Please seek immediate care if you develop the following: Your symptoms are no better or worse in 3 days. There is severe back pain or lower abdominal pain.  You have a fever.  There is nausea or vomiting.  New symptoms develop.  You have any additional concerns.   Please see the attached resource guide for help finding a primary care provider. It is very important that you arrange a follow up appointment as soon as possible.

## 2017-01-18 NOTE — ED Triage Notes (Signed)
States she ran out of BP medication yesterday. She feels shaky and has a headache. Feels like her BP is high.

## 2017-01-18 NOTE — ED Provider Notes (Signed)
[facility] EMERGENCY DEPARTMENT Provider Note   CSN: [id number] Arrival date & time: 01/18/17  1254     History   Chief Complaint Chief Complaint  Patient presents with  . Hypertension    HPI DOMANIQUE HUESMAN is a 61 y.o. female.  The history is provided by the patient and medical records. No language interpreter was used.   DASHAWNA DELBRIDGE is a 61 y.o. female  with a PMH of HTN, HLD, COPD who presents to the Emergency Department complaining of elevated blood pressure. Patient states that she ran out of her blood pressure medication 3-4 days ago, however her last 30 day prescription she "tried to make last" about 2-3 months. Patient states that she will take her medication every other day and sometimes every third day so that she would not run out. She is not followed by a primary care provider. She does report associated headache which occurs when her blood pressure becomes elevated. She denies any visual changes, slurred speech, weakness, numbness, back pain, abdominal pain, chest pain or trouble breathing. She also reports that her urine has been dark and has a foul odor lately. She is concerned that she may have a urinary tract infection.    Past Medical History:  Diagnosis Date  . Asthma   . Bladder disease   . COPD (chronic obstructive pulmonary disease) ([place])   . Depression   . Hyperlipidemia   . Hypertension   . Peptic ulcer     There are no active problems to display for this patient.   Past Surgical History:  Procedure Laterality Date  . ABDOMINAL HYSTERECTOMY      OB History    No data available       Home Medications    Prior to Admission medications   Medication Sig Start Date End Date Taking? Authorizing Provider  albuterol (PROVENTIL HFA;VENTOLIN HFA) 108 (90 BASE) MCG/ACT inhaler Inhale 2 puffs into the lungs every 4 (four) hours as needed for wheezing. 09/08/12   Baker, Freeman Caldron, PA-C  albuterol (PROVENTIL) (2.5 MG/3ML) 0.083%  nebulizer solution Take 2.5 mg by nebulization 3 (three) times daily.    [provider]  Aspirin-Salicylamide-Caffeine (BC HEADACHE POWDER PO) Take 1 Package by mouth daily as needed (pain).    [provider]  budesonide (PULMICORT) 1 MG/2ML nebulizer solution Take 2 mLs (1 mg total) by nebulization daily. 11/03/15   Malvin Johns, MD  cephALEXin (KEFLEX) 500 MG capsule Take 1 capsule (500 mg total) by mouth 3 (three) times daily. 01/18/17   Iran Kievit, Ozella Almond, PA-C  cyclobenzaprine (FLEXERIL) 5 MG tablet Take 1 tablet (5 mg total) by mouth 3 (three) times daily as needed for muscle spasms. 12/03/16   Drenda Freeze, MD  diclofenac sodium (VOLTAREN) 1 % GEL Apply 4 g topically 4 (four) times daily as needed (for pain).    [provider]  Fluoxetine HCl, PMDD, 20 MG TABS Take 1 tablet (20 mg total) by mouth once. Patient taking differently: Take 40 mg by mouth daily.  12/21/14   Nehemiah Settle, NP  gabapentin (NEURONTIN) 400 MG capsule Take 400 mg by mouth daily.    [provider]  HYDROcodone-acetaminophen (NORCO/VICODIN) 5-325 MG tablet Take 1 tablet by mouth every 6 (six) hours as needed. 12/03/16   Drenda Freeze, MD  lisinopril (PRINIVIL,ZESTRIL) 10 MG tablet Take 1 tablet (10 mg total) by mouth daily. 01/18/17   Stayce Delancy, Ozella Almond, PA-C  lisinopril-hydrochlorothiazide (PRINZIDE,ZESTORETIC) 10-12.5 MG per  tablet Take 1 tablet by mouth daily. 12/21/14   Nehemiah Settle, NP  tiotropium (SPIRIVA) 18 MCG inhalation capsule Place 18 mcg into inhaler and inhale daily.    [provider]  traZODone (DESYREL) 50 MG tablet Take 1 tablet (50 mg total) by mouth at bedtime. Patient taking differently: Take 100 mg by mouth at bedtime.  12/21/14   Nehemiah Settle, NP     Family History No family history on file.  Social History Social History  Substance Use Topics  . Smoking status: Former Research scientist (life sciences)  . Smokeless tobacco: Never Used  . Alcohol  use No     Comment: Former ETOH     Allergies   Patient has no known allergies.   Review of Systems Review of Systems  Genitourinary:       + dark, foul-smelling urine.  Neurological: Positive for headaches.  All other systems reviewed and are negative.    Physical Exam Updated Vital Signs BP (!) 155/93   Pulse 67   Temp 98.3 F (36.8 C) (Oral)   Resp 16   Ht 4\' 11"  (1.499 m)   Wt 63.5 kg (140 lb)   SpO2 96%   BMI 28.28 kg/m   Physical Exam  Constitutional: She is oriented to person, place, and time. She appears well-developed and well-nourished. No distress.  HENT:  Head: Normocephalic and atraumatic.  Cardiovascular: Normal rate, regular rhythm and normal heart sounds.   No murmur heard. Pulmonary/Chest: Effort normal and breath sounds normal. No respiratory distress. She has no wheezes. She has no rales.  Abdominal: Soft. She exhibits no distension.  No abdominal or CVA tenderness.  Musculoskeletal: She exhibits no edema.  Neurological: She is alert and oriented to person, place, and time.  Speech clear and goal oriented. CN 2-12 grossly intact. Normal finger-to-nose and rapid alternating movements. No drift. Strength and sensation intact. Steady gait.  Skin: Skin is warm and dry.  Nursing note and vitals reviewed.    ED Treatments / Results  Labs (all labs ordered are listed, but only abnormal results are displayed) Labs Reviewed  BASIC METABOLIC PANEL - Abnormal; Notable for the following:       Result Value   Potassium 3.0 (*)    Glucose, Bld 105 (*)    All other components within normal limits  URINALYSIS, ROUTINE W REFLEX MICROSCOPIC - Abnormal; Notable for the following:    Specific Gravity, Urine <1.005 (*)    Hgb urine dipstick TRACE (*)    Leukocytes, UA SMALL (*)    All other components within normal limits  URINALYSIS, MICROSCOPIC (REFLEX) - Abnormal; Notable for the following:    Bacteria, UA MANY (*)    Squamous Epithelial / LPF 0-5 (*)     All other components within normal limits  URINE CULTURE  CBC WITH DIFFERENTIAL/PLATELET    EKG  EKG Interpretation  Date/Time:  Tuesday January 18 2017 13:16:59 EDT Ventricular Rate:  81 PR Interval:  130 QRS Duration: 80 QT Interval:  366 QTC Calculation: 425 R Axis:   83 Text Interpretation:  Normal sinus rhythm Normal ECG Confirmed by Quintella Reichert [phone]) on 01/18/2017 2:58:48 PM       Radiology No results found.  Procedures Procedures (including critical care time)  Medications Ordered in ED Medications  lisinopril (PRINIVIL,ZESTRIL) tablet 10 mg (10 mg Oral Given 01/18/17 1536)  potassium chloride SA (K-DUR,KLOR-CON) CR tablet 40 mEq (40 mEq Oral Given 01/18/17 1615)     Initial Impression / Assessment  and Plan / ED Course  I have reviewed the triage vital signs and the nursing notes.  Pertinent labs & imaging results that were available during my care of the patient were reviewed by me and considered in my medical decision making (see chart for details).    ELLAINA SCHULER is a 61 y.o. female who presents to ED for blood pressure medication refill. She does report mild intermittent headaches. Normal neuro examination. BP 190/98. CBC wdl. BMP with K+ of 3.0. Normal kidney function. Patient has been on lisinopril/hctz combo. Given hypokalemia and lack of primary care physician, will give rx for just lisinopril. Stressed the importance of PCP follow up and resource guide provided.   Patient additionally complained of foul-smelling, dark urine. UA with small leuks, 6-30 WBC's and many bacteria. Given symptoms, will send for culture and treat with keflex.   Evaluation does not show pathology that would require ongoing emergent intervention or inpatient treatment.  PCP follow up again encouraged. Reasons to return to ER discussed. All questions answered.   Patient discussed with Dr. Ralene Bathe who agrees with treatment plan.    Final Clinical Impressions(s) / ED  Diagnoses   Final diagnoses:  Essential hypertension  Hypokalemia  Lower urinary tract symptoms    New Prescriptions Discharge Medication List as of 01/18/2017  4:18 PM    START taking these medications   Details  cephALEXin (KEFLEX) 500 MG capsule Take 1 capsule (500 mg total) by mouth 3 (three) times daily., Starting Tue 01/18/2017, Print    lisinopril (PRINIVIL,ZESTRIL) 10 MG tablet Take 1 tablet (10 mg total) by mouth daily., Starting Tue 01/18/2017, Print         Srinika Delone, Ozella Almond, PA-C 01/18/17 1640    Quintella Reichert, MD 01/18/17 1919

## 2017-01-20 LAB — URINE CULTURE: Culture: >=100000 — AB

## 2017-01-21 ENCOUNTER — Telehealth: Payer: Self-pay

## 2017-01-21 NOTE — Telephone Encounter (Signed)
Post ED Visit - Positive Culture Follow-up  Culture report reviewed by antimicrobial stewardship pharmacist:  []  Elenor Quinones, Pharm.D. []  Heide Guile, Pharm.D., BCPS AQ-ID [x]  Parks Neptune, Pharm.D., BCPS []  Alycia Rossetti, Pharm.D., BCPS []  [place], Pharm.D., BCPS, AAHIVP []  Legrand Como, Pharm.D., BCPS, AAHIVP []  Salome Arnt, PharmD, BCPS []  Dimitri Ped, PharmD, BCPS []  Vincenza Hews, PharmD, BCPS  Positive urine culture Treated with Cephalexin, organism sensitive to the same and no further patient follow-up is required at this time.  Genia Del 01/21/2017, 10:09 AM

## 2017-02-11 ENCOUNTER — Ambulatory Visit (INDEPENDENT_AMBULATORY_CARE_PROVIDER_SITE_OTHER): Payer: Self-pay | Admitting: Family Medicine

## 2017-02-11 ENCOUNTER — Encounter: Payer: Self-pay | Admitting: Family Medicine

## 2017-02-11 VITALS — BP 130/80 | HR 73 | Temp 98.4°F | Resp 16 | Ht 59.0 in | Wt 148.0 lb

## 2017-02-11 DIAGNOSIS — E876 Hypokalemia: Secondary | ICD-10-CM

## 2017-02-11 DIAGNOSIS — J449 Chronic obstructive pulmonary disease, unspecified: Secondary | ICD-10-CM | POA: Insufficient documentation

## 2017-02-11 DIAGNOSIS — J441 Chronic obstructive pulmonary disease with (acute) exacerbation: Secondary | ICD-10-CM

## 2017-02-11 DIAGNOSIS — I10 Essential (primary) hypertension: Secondary | ICD-10-CM

## 2017-02-11 DIAGNOSIS — R0609 Other forms of dyspnea: Secondary | ICD-10-CM

## 2017-02-11 MED ORDER — LISINOPRIL 20 MG PO TABS: 20.0000 mg | ORAL_TABLET | Freq: Every day | ORAL | 3 refills | Status: DC

## 2017-02-11 MED ORDER — BUDESONIDE 1 MG/2ML IN SUSP: 1.0000 mg | Freq: Two times a day (BID) | RESPIRATORY_TRACT | 3 refills | Status: DC

## 2017-02-11 MED ORDER — ALBUTEROL SULFATE (2.5 MG/3ML) 0.083% IN NEBU: 2.5000 mg | INHALATION_SOLUTION | Freq: Three times a day (TID) | RESPIRATORY_TRACT | 2 refills | Status: DC | PRN

## 2017-02-11 NOTE — Progress Notes (Signed)
HPI:   Monica Scott is a 61 y.o. female, who is here today to establish care.  Former PCP: N/A Last preventive routine visit: Many years ago.  Chronic medical problems: HLD,HTN,COPD,PUD,depression, insomnia, chronic back pain, OA, bladder disease/cystocele (self cath)  She follows with psychiatrist at [facility], monthly visits.  HTN: Dx 10+ years ago. Last eye exam 2 years ago.  She was on HCTZ until a month ago, discontinued after ER visit  because hypoK+. Lab Results  Component Value Date   CREATININE 0.71 01/18/2017   BUN 8 01/18/2017   NA 137 01/18/2017   K 3.0 (L) 01/18/2017   CL 102 01/18/2017   CO2 27 01/18/2017   Concerned about elevated BP's since HCTZ was discontinued. Home BP's 160's/90's. She is on Lisinopril 10 mg daily.  Denies severe/frequent headache, visual changes, chest pain, palpitation, claudication, focal weakness, or edema.   HLD: She has not tolerated statin treatments.  Concerns today:   COPD: Also Hx of asthma. For a bout a month she has worsening dyspnea, exacerbated by exertion but has also noted problem at rest. She is on Neb Budesonide bid and Albuterol neb TID She has not had Budesonide in about 6 weeks, ran out.  She has intermittent wheezing and exertional dyspnea. She has not associated chest discomfort or diaphoresis.  Reports Hx of "low oxygen" with exertion as her baseline. She has not followed with pulmonologist. She is not on supplemental O2.   Former smoker. She has not noted associated fever,chills,worsening cough or increased sputum production,hemoptysis.  Lab Results  Component Value Date   WBC 8.4 01/18/2017   HGB 12.1 01/18/2017   HCT 36.8 01/18/2017   MCV 88.0 01/18/2017   PLT 380 01/18/2017    Per pt report, pneumonia vaccine in 2017.   Review of Systems  Constitutional: Positive for fatigue. Negative for activity change, appetite change and fever.  HENT: Negative for mouth sores,  nosebleeds, sore throat and trouble swallowing.   Eyes: Negative for redness and visual disturbance.  Respiratory: Positive for shortness of breath and wheezing. Negative for cough.   Cardiovascular: Negative for chest pain, palpitations and leg swelling.  Gastrointestinal: Negative for abdominal pain, nausea and vomiting.       Negative for changes in bowel habits.  Genitourinary: Negative for decreased urine volume, dysuria and hematuria.  Musculoskeletal: Positive for arthralgias and back pain. Negative for gait problem.  Skin: Negative for rash and wound.  Allergic/Immunologic: Positive for environmental allergies.  Neurological: Negative for syncope, weakness and headaches.  Hematological: Negative for adenopathy. Does not bruise/bleed easily.  Psychiatric/Behavioral: Positive for sleep disturbance. Negative for confusion. The patient is nervous/anxious.       Current Outpatient Medications on File Prior to Visit  Medication Sig Dispense Refill  . Aspirin-Salicylamide-Caffeine (BC HEADACHE POWDER PO) Take 1 Package by mouth daily as needed (pain).    . Fluoxetine HCl, PMDD, 20 MG TABS Take 1 tablet (20 mg total) by mouth once. (Patient taking differently: Take 40 mg by mouth daily. ) 30 each 0  . gabapentin (NEURONTIN) 400 MG capsule Take 400 mg by mouth daily.     No current facility-administered medications on file prior to visit.      Past Medical History:  Diagnosis Date  . Asthma   . Bladder disease   . COPD (chronic obstructive pulmonary disease) ([place])   . Depression   . Hyperlipidemia   . Hypertension   . Peptic ulcer  No Known Allergies  Family History  Problem Relation Age of Onset  . Alcohol abuse Mother   . Arthritis Mother   . Cancer Mother   . Depression Mother   . Early death Mother   . Alcohol abuse Father   . Depression Father   . Alcohol abuse Sister   . Depression Sister   . Alcohol abuse Brother   . Alcohol abuse Daughter   . Depression  Daughter   . Alcohol abuse Son   . COPD Maternal Grandfather   . Heart disease Maternal Grandfather   . Hypertension Maternal Grandfather     Social History   Socioeconomic History  . Marital status: Single    Spouse name: None  . Number of children: None  . Years of education: None  . Highest education level: None  Social Needs  . Financial resource strain: None  . Food insecurity - worry: None  . Food insecurity - inability: None  . Transportation needs - medical: None  . Transportation needs - non-medical: None  Occupational History  . None  Tobacco Use  . Smoking status: Former Research scientist (life sciences)  . Smokeless tobacco: Never Used  Substance and Sexual Activity  . Alcohol use: No    Comment: Former ETOH  . Drug use: No  . Sexual activity: No  Other Topics Concern  . None  Social History Narrative  . None    Vitals:   02/11/17 1353 02/11/17 1428  BP: 136/84 130/80  Pulse: 73   Resp: 16   Temp: 98.4 F (36.9 C)   SpO2: 93%     Body mass index is 29.89 kg/m.   Physical Exam  Nursing note and vitals reviewed. Constitutional: She is oriented to person, place, and time. She appears well-developed. No distress.  HENT:  Head: Normocephalic and atraumatic.  Mouth/Throat: Oropharynx is clear and moist and mucous membranes are normal. Abnormal dentition.  Eyes: Conjunctivae are normal. Pupils are equal, round, and reactive to light.  Neck: No JVD present.  Cardiovascular: Normal rate and regular rhythm.  No murmur heard. Pulses:      Dorsalis pedis pulses are 2+ on the right side, and 2+ on the left side.  Respiratory: Effort normal. No respiratory distress. She has decreased breath sounds. She has no wheezes. She has no rhonchi. She has no rales.  GI: Soft. She exhibits no mass. There is no hepatomegaly. There is no tenderness.  Musculoskeletal: She exhibits no edema.  Lymphadenopathy:    She has no cervical adenopathy.  Neurological: She is alert and oriented to  person, place, and time. She has normal strength. Gait normal.  Skin: Skin is warm. No rash noted. No erythema.  Psychiatric: Her mood appears anxious.  Fairly groomed, good eye contact.    ASSESSMENT AND PLAN:   Monica Scott was seen today for establish care.  Diagnoses and all orders for this visit:  Exertional dyspnea  We discussed possible etiologies, it seems related to COPD in this case. Instructed about warning signs.  Chronic obstructive pulmonary disease with acute exacerbation (HCC)  Not well controlled. She does not have health insurance, so she cannot afford inhalers.  Resumed Pulmicort neb bid and continue Albuterol neb tid prn. Some side effects of medications discussed. Refused flu vaccine due to cost. Instructed about warning signs. F/U in 2 months,before if needed.  -     albuterol (PROVENTIL) (2.5 MG/3ML) 0.083% nebulizer solution; Take 3 mLs (2.5 mg total) 3 (three) times daily as needed by  nebulization for wheezing or shortness of breath. -     budesonide (PULMICORT) 1 MG/2ML nebulizer solution; Take 2 mLs (1 mg total) 2 (two) times daily by nebulization.  Hypertension, essential, benign  BP here today adequate, re-checked. Based on home BP's Lisinopril increased from 10 mg to 20 mg. Continue monitoring BP's. Low salt diet. Overdue for eye exam. BP check in 4 weeks and F/U in 4 months.  -     lisinopril (PRINIVIL,ZESTRIL) 20 MG tablet; Take 1 tablet (20 mg total) daily by mouth.  Hypokalemia  HCTZ recently discontinued. Today Lisinopril increased, so we will hold on lab until nurse visit for BP check, 4 weeks. K+ rich diet to continue.    Betty G. Martinique, MD  [facility]. [place] office.

## 2017-02-11 NOTE — Patient Instructions (Signed)
A few things to remember from today's visit:   Hypertension, essential, benign - Plan: lisinopril (PRINIVIL,ZESTRIL) 20 MG tablet  Chronic obstructive pulmonary disease with acute exacerbation ([place]) - Plan: albuterol (PROVENTIL) (2.5 MG/3ML) 0.083% nebulizer solution, budesonide (PULMICORT) 1 MG/2ML nebulizer solution  Hypokalemia    Please be sure medication list is accurate. If a new problem present, please set up appointment sooner than planned today.

## 2017-02-14 ENCOUNTER — Telehealth: Payer: Self-pay | Admitting: Family Medicine

## 2017-02-14 NOTE — Telephone Encounter (Signed)
Copied from [place] [id number]. Topic: General - Other >> Feb 11, 2017  3:54 PM Scherrie Gerlach wrote: Reason for CRM: pt states the  budesonide (PULMICORT) 1 MG/2ML nebulizer solution was over $600 and she cannot not afford. Pt has no insurance. Needs something else called in.  Also was requesting prednisone to get over this upper resp  [place], [place] [address] [phone] (Phone) [phone] (Fax)  Do we have any discount card in the office?

## 2017-02-14 NOTE — Telephone Encounter (Signed)
She requested refills on this particular medication. She has no health insurance and most of inhalers are expensive. Options: QVAR redihaler, Flovent, Pulmicor inh among some. Combination inhalers as Symbicort, Advair,Dulera among some are ideal.  She can look up for assistance programs and/or coupons on web side of manufactures/pharmacologic companies.  Thanks, BJ

## 2017-02-14 NOTE — Telephone Encounter (Signed)
We may have some discounts cards also in the med closet.  Thanks, BJ

## 2017-02-19 DIAGNOSIS — I10 Essential (primary) hypertension: Secondary | ICD-10-CM | POA: Insufficient documentation

## 2017-02-24 NOTE — Telephone Encounter (Signed)
I left a voice message for pt to try looking into assistant programs for medications and also online for coupons or discount cards.

## 2017-02-27 ENCOUNTER — Emergency Department (HOSPITAL_BASED_OUTPATIENT_CLINIC_OR_DEPARTMENT_OTHER): Payer: Self-pay

## 2017-02-27 ENCOUNTER — Other Ambulatory Visit: Payer: Self-pay

## 2017-02-27 ENCOUNTER — Encounter (HOSPITAL_BASED_OUTPATIENT_CLINIC_OR_DEPARTMENT_OTHER): Payer: Self-pay | Admitting: Emergency Medicine

## 2017-02-27 ENCOUNTER — Emergency Department (HOSPITAL_BASED_OUTPATIENT_CLINIC_OR_DEPARTMENT_OTHER)
Admission: EM | Admit: 2017-02-27 | Discharge: 2017-02-27 | Disposition: A | Discharge status: Home / Self Care | Payer: Self-pay | Attending: Emergency Medicine | Admitting: Emergency Medicine

## 2017-02-27 DIAGNOSIS — R079 Chest pain, unspecified: Secondary | ICD-10-CM | POA: Insufficient documentation

## 2017-02-27 DIAGNOSIS — R05 Cough: Secondary | ICD-10-CM | POA: Insufficient documentation

## 2017-02-27 DIAGNOSIS — J441 Chronic obstructive pulmonary disease with (acute) exacerbation: Secondary | ICD-10-CM | POA: Insufficient documentation

## 2017-02-27 DIAGNOSIS — Z79899 Other long term (current) drug therapy: Secondary | ICD-10-CM | POA: Insufficient documentation

## 2017-02-27 DIAGNOSIS — J45909 Unspecified asthma, uncomplicated: Secondary | ICD-10-CM | POA: Insufficient documentation

## 2017-02-27 DIAGNOSIS — Z87891 Personal history of nicotine dependence: Secondary | ICD-10-CM | POA: Insufficient documentation

## 2017-02-27 DIAGNOSIS — N3001 Acute cystitis with hematuria: Secondary | ICD-10-CM | POA: Insufficient documentation

## 2017-02-27 DIAGNOSIS — I10 Essential (primary) hypertension: Secondary | ICD-10-CM | POA: Insufficient documentation

## 2017-02-27 LAB — URINALYSIS, MICROSCOPIC (REFLEX)

## 2017-02-27 LAB — CBC WITH DIFFERENTIAL/PLATELET
Basophils Absolute: 0.1 10*3/uL (ref 0.0–0.1)
Basophils Relative: 1 %
Eosinophils Absolute: 0.5 10*3/uL (ref 0.0–0.7)
Eosinophils Relative: 6 %
HCT: 38 % (ref 36.0–46.0)
Hemoglobin: 12.2 g/dL (ref 12.0–15.0)
Lymphocytes Relative: 30 %
Lymphs Abs: 2.4 10*3/uL (ref 0.7–4.0)
MCH: 28.8 pg (ref 26.0–34.0)
MCHC: 32.1 g/dL (ref 30.0–36.0)
MCV: 89.8 fL (ref 78.0–100.0)
Monocytes Absolute: 0.8 10*3/uL (ref 0.1–1.0)
Monocytes Relative: 10 %
Neutro Abs: 4.2 10*3/uL (ref 1.7–7.7)
Neutrophils Relative %: 53 %
Platelets: 381 10*3/uL (ref 150–400)
RBC: 4.23 MIL/uL (ref 3.87–5.11)
RDW: 15.1 % (ref 11.5–15.5)
WBC: 7.9 10*3/uL (ref 4.0–10.5)

## 2017-02-27 LAB — URINALYSIS, ROUTINE W REFLEX MICROSCOPIC
Bilirubin Urine: NEGATIVE
Glucose, UA: NEGATIVE mg/dL
Ketones, ur: NEGATIVE mg/dL
Nitrite: POSITIVE — AB
Protein, ur: NEGATIVE mg/dL
Specific Gravity, Urine: 1.015 (ref 1.005–1.030)
pH: 6 (ref 5.0–8.0)

## 2017-02-27 LAB — BASIC METABOLIC PANEL
Anion gap: 6 (ref 5–15)
BUN: 8 mg/dL (ref 6–20)
CO2: 27 mmol/L (ref 22–32)
Calcium: 8.8 mg/dL — ABNORMAL LOW (ref 8.9–10.3)
Chloride: 106 mmol/L (ref 101–111)
Creatinine, Ser: 0.82 mg/dL (ref 0.44–1.00)
GFR calc Af Amer: >60 mL/min (ref >60)
GFR calc non Af Amer: >60 mL/min (ref >60)
Glucose, Bld: 93 mg/dL (ref 65–99)
Potassium: 3.5 mmol/L (ref 3.5–5.1)
Sodium: 139 mmol/L (ref 135–145)

## 2017-02-27 LAB — TROPONIN I: Troponin I: <0.03 ng/mL (ref <0.03)

## 2017-02-27 LAB — BRAIN NATRIURETIC PEPTIDE: B Natriuretic Peptide: 57.8 pg/mL (ref 0.0–100.0)

## 2017-02-27 MED ORDER — CEPHALEXIN 500 MG PO CAPS: 500.0000 mg | ORAL_CAPSULE | Freq: Two times a day (BID) | ORAL | 0 refills | Status: DC

## 2017-02-27 MED ORDER — PREDNISONE 20 MG PO TABS: 60.0000 mg | ORAL_TABLET | Freq: Every day | ORAL | 0 refills | Status: DC

## 2017-02-27 MED ORDER — IPRATROPIUM-ALBUTEROL 0.5-2.5 (3) MG/3ML IN SOLN
RESPIRATORY_TRACT | Status: AC
  Administered 2017-02-27: 3 mL
  Filled 2017-02-27: qty 3

## 2017-02-27 MED ORDER — BUDESONIDE-FORMOTEROL FUMARATE 160-4.5 MCG/ACT IN AERO: 2.0000 | INHALATION_SPRAY | Freq: Every day | RESPIRATORY_TRACT | 0 refills | Status: DC

## 2017-02-27 MED ORDER — PREDNISONE 50 MG PO TABS
60.0000 mg | ORAL_TABLET | Freq: Once | ORAL | Status: AC
  Administered 2017-02-27: 60 mg via ORAL
  Filled 2017-02-27: qty 1

## 2017-02-27 MED ORDER — TIOTROPIUM BROMIDE MONOHYDRATE 18 MCG IN CAPS
18.0000 ug | ORAL_CAPSULE | Freq: Every day | RESPIRATORY_TRACT | 0 refills | Status: DC
Start: 2017-02-27 — End: 2017-02-27

## 2017-02-27 MED ORDER — ALBUTEROL (5 MG/ML) CONTINUOUS INHALATION SOLN
INHALATION_SOLUTION | RESPIRATORY_TRACT | Status: AC
  Administered 2017-02-27: 2.5 mg
  Filled 2017-02-27: qty 0.5

## 2017-02-27 NOTE — ED Provider Notes (Signed)
[facility] EMERGENCY DEPARTMENT Provider Note   CSN: [id number] Arrival date & time: 02/27/17  1323     History   Chief Complaint Chief Complaint  Patient presents with  . Shortness of Breath    HPI Monica Scott is a 61 y.o. female with history of COPD, hypertension, hyperlipidemia who presents with a one-month history of worsening shortness of breath on exertion.  Patient developed cold symptoms over the past week and has had worsening shortness of breath that has been waking her up in the middle the night.  She has also had coughing. Her cough has been mostly dry. She reports seeing blood streaking a couple times, but no significant change in her sputum. She has had to prop herself up on 3 pillows at night.  She denies any new leg pain or swelling.  She has had fleeting sharp chest pain, however none that is significant.  She denies any fevers, abdominal pain, nausea, vomiting.  She has been using her nebulizer at home with some relief, as well as Mucinex. Patient also reports her urine has been cloudy for the past few days.  HPI  Past Medical History:  Diagnosis Date  . Asthma   . Bladder disease   . COPD (chronic obstructive pulmonary disease) ([place])   . Depression   . Hyperlipidemia   . Hypertension   . Peptic ulcer     Patient Active Problem List   Diagnosis Date Noted  . Hypertension, essential, benign 02/19/2017  . COPD (chronic obstructive pulmonary disease) ([place]) 02/11/2017    Past Surgical History:  Procedure Laterality Date  . ABDOMINAL HYSTERECTOMY      OB History    No data available       Home Medications    Prior to Admission medications   Medication Sig Start Date End Date Taking? Authorizing Provider  albuterol (PROVENTIL) (2.5 MG/3ML) 0.083% nebulizer solution Take 3 mLs (2.5 mg total) 3 (three) times daily as needed by nebulization for wheezing or shortness of breath. 02/11/17   Martinique, Betty G, MD  Aspirin-Salicylamide-Caffeine  (BC HEADACHE POWDER PO) Take 1 Package by mouth daily as needed (pain).    [provider]  budesonide (PULMICORT) 1 MG/2ML nebulizer solution Take 2 mLs (1 mg total) 2 (two) times daily by nebulization. 02/11/17   Martinique, Betty G, MD  budesonide-formoterol [facility]) 160-4.5 MCG/ACT inhaler Inhale 2 puffs into the lungs daily. 02/27/17   Janece Laidlaw, Bea Graff, PA-C  cephALEXin (KEFLEX) 500 MG capsule Take 1 capsule (500 mg total) by mouth 2 (two) times daily. 02/27/17   Fantashia Shupert, Bea Graff, PA-C  Fluoxetine HCl, PMDD, 20 MG TABS Take 1 tablet (20 mg total) by mouth once. Patient taking differently: Take 40 mg by mouth daily.  12/21/14   Nehemiah Settle, NP  gabapentin (NEURONTIN) 400 MG capsule Take 400 mg by mouth daily.    [provider]  lisinopril (PRINIVIL,ZESTRIL) 20 MG tablet Take 1 tablet (20 mg total) daily by mouth. 02/11/17   Martinique, Betty G, MD  predniSONE (DELTASONE) 20 MG tablet Take 3 tablets (60 mg total) by mouth daily. 02/27/17   Frederica Kuster, PA-C    Family History Family History  Problem Relation Age of Onset  . Alcohol abuse Mother   . Arthritis Mother   . Cancer Mother   . Depression Mother   . Early death Mother   . Alcohol abuse Father   . Depression Father   . Alcohol abuse Sister   .  Depression Sister   . Alcohol abuse Brother   . Alcohol abuse Daughter   . Depression Daughter   . Alcohol abuse Son   . COPD Maternal Grandfather   . Heart disease Maternal Grandfather   . Hypertension Maternal Grandfather     Social History Social History   Tobacco Use  . Smoking status: Former Research scientist (life sciences)  . Smokeless tobacco: Never Used  Substance Use Topics  . Alcohol use: No    Comment: Former ETOH  . Drug use: No     Allergies   Patient has no known allergies.   Review of Systems Review of Systems  Constitutional: Negative for chills and fever.  HENT: Negative for facial swelling and sore throat.   Respiratory: Positive for cough and  shortness of breath.   Cardiovascular: Positive for chest pain (fleeting, sharp).  Gastrointestinal: Negative for abdominal pain, nausea and vomiting.  Genitourinary: Negative for dysuria.  Musculoskeletal: Negative for back pain.  Skin: Negative for rash and wound.  Neurological: Negative for headaches.  Psychiatric/Behavioral: The patient is not nervous/anxious.      Physical Exam Updated Vital Signs BP (!) 151/87 (BP Location: Right Arm)   Pulse 79   Temp 98.8 F (37.1 C) (Oral)   Resp 20   Ht 4\' 11"  (1.499 m)   Wt 67.1 kg (148 lb)   SpO2 93%   BMI 29.89 kg/m   Physical Exam  Constitutional: She appears well-developed and well-nourished. No distress.  HENT:  Head: Normocephalic and atraumatic.  Mouth/Throat: Oropharynx is clear and moist. No oropharyngeal exudate.  Eyes: Conjunctivae are normal. Pupils are equal, round, and reactive to light. Right eye exhibits no discharge. Left eye exhibits no discharge. No scleral icterus.  Neck: Normal range of motion. Neck supple. No thyromegaly present.  Cardiovascular: Normal rate, regular rhythm, normal heart sounds and intact distal pulses. Exam reveals no gallop and no friction rub.  No murmur heard. Pulmonary/Chest: Effort normal and breath sounds normal. No stridor. No respiratory distress. She has no wheezes. She has no rales.  Faint expiratory wheeze in the R lung, transient  Abdominal: Soft. Bowel sounds are normal. She exhibits no distension. There is no tenderness. There is no rebound and no guarding.  Musculoskeletal: She exhibits no edema.  Lymphadenopathy:    She has no cervical adenopathy.  Neurological: She is alert. Coordination normal.  Skin: Skin is warm and dry. No rash noted. She is not diaphoretic. No pallor.  Psychiatric: She has a normal mood and affect.  Nursing note and vitals reviewed.    ED Treatments / Results  Labs (all labs ordered are listed, but only abnormal results are displayed) Labs  Reviewed  BASIC METABOLIC PANEL - Abnormal; Notable for the following components:      Result Value   Calcium 8.8 (*)    All other components within normal limits  URINALYSIS, ROUTINE W REFLEX MICROSCOPIC - Abnormal; Notable for the following components:   Color, Urine STRAW (*)    Hgb urine dipstick TRACE (*)    Nitrite POSITIVE (*)    Leukocytes, UA TRACE (*)    All other components within normal limits  URINALYSIS, MICROSCOPIC (REFLEX) - Abnormal; Notable for the following components:   Bacteria, UA MANY (*)    Squamous Epithelial / LPF 0-5 (*)    All other components within normal limits  URINE CULTURE  CBC WITH DIFFERENTIAL/PLATELET  TROPONIN I  BRAIN NATRIURETIC PEPTIDE    EKG  EKG Interpretation  Date/Time:  Sunday  February 27 2017 13:36:56 EST Ventricular Rate:  71 PR Interval:    QRS Duration: 88 QT Interval:  379 QTC Calculation: 412 R Axis:   92 Text Interpretation:  Sinus rhythm Right axis deviation Nonspecific T abnrm, anterolateral leads Baseline wander in lead(s) V3 No significant change since last tracing Confirmed by Theotis Burrow [id number]) on 02/27/2017 4:42:28 PM       Radiology Dg Chest 2 View  Result Date: 02/27/2017 CLINICAL DATA:  Nonproductive cough, shortness of Breath EXAM: CHEST  2 VIEW COMPARISON:  05/29/2016 FINDINGS: There is hyperinflation of the lungs compatible with COPD. Bibasilar atelectasis. Heart is normal size. No effusions or acute bony abnormality. IMPRESSION: COPD.  Bibasilar atelectasis. Electronically Signed   By: Rolm Baptise M.D.   On: 02/27/2017 14:42    Procedures Procedures (including critical care time)  Medications Ordered in ED Medications  albuterol (PROVENTIL, VENTOLIN) (5 MG/ML) 0.5% continuous inhalation solution (2.5 mg  Given 02/27/17 1338)  ipratropium-albuterol (DUONEB) 0.5-2.5 (3) MG/3ML nebulizer solution (3 mLs  Given 02/27/17 1338)  predniSONE (DELTASONE) tablet 60 mg (60 mg Oral Given 02/27/17 1658)      Initial Impression / Assessment and Plan / ED Course  I have reviewed the triage vital signs and the nursing notes.  Pertinent labs & imaging results that were available during my care of the patient were reviewed by me and considered in my medical decision making (see chart for details).     Suspect progressive worsening of COPD and acute exacerbation.  Patient also with urinary tract infection.  Chest x-ray shows COPD and bibasilar atelectasis, however no fluid collection.  BNP is within normal limits.  Troponin negative.  Labs otherwise unremarkable.  Patient lungs are clear following DuoNeb treatment in the ED.  She was ambulated in the ED with with 9 oxygen saturations and 4% denied any shortness of breath while walking.  Will discharge patient home with Symbicort and 5-day prednisone burst.  Patient currently not on any COPD maintenance medications.  We will also discharge patient home with Keflex for UTI.  UA shows positive nitrites, 6-30 WBCs, many bacteria, trace leukocytes, trace hematuria.  Urine culture sent.  Patient advised to follow-up with PCP.  Strict return precautions given.  Patient understands and agrees with plan. Patient vitals stable and discharged in satisfactory condition. I discussed patient case with Dr. Rex Kras who guided the patient's management and agrees with plan.   Final Clinical Impressions(s) / ED Diagnoses   Final diagnoses:  COPD exacerbation ([place])  Acute cystitis with hematuria    ED Discharge Orders        Ordered    tiotropium (SPIRIVA HANDIHALER) 18 MCG inhalation capsule  Daily,   Status:  Discontinued     02/27/17 1652    cephALEXin (KEFLEX) 500 MG capsule  2 times daily,   Status:  Discontinued     02/27/17 1652    predniSONE (DELTASONE) 20 MG tablet  Daily     02/27/17 1652    budesonide-formoterol (SYMBICORT) 160-4.5 MCG/ACT inhaler  Daily     02/27/17 1701    cephALEXin (KEFLEX) 500 MG capsule  2 times daily     02/27/17 1701        Frederica Kuster, PA-C 02/27/17 1711    Little, Wenda Overland, MD 02/27/17 2105

## 2017-02-27 NOTE — Discharge Instructions (Signed)
Take prednisone for the next 5 days.  Take Keflex until completed for urinary tract infection.  Begin taking Symbicort as prescribed.  Take this OR the budesonide given by your doctor.  Please follow-up with your primary care provider for further evaluation and COPD.  Please return to emergency department if you develop any new or worsening symptoms.

## 2017-02-27 NOTE — ED Triage Notes (Signed)
Patient states that she has had SOb with exertion x 1 month  - patient reports that today she started to have a dry cough and can not catch her breath

## 2017-02-27 NOTE — ED Notes (Signed)
Patient walked one lap around department. Initial SAT 94%, and dropped briefly to 92%, then back to 94%. No complaints of SOB when walking.

## 2017-03-03 LAB — URINE CULTURE: Culture: >=100000 — AB

## 2017-03-04 ENCOUNTER — Telehealth: Payer: Self-pay

## 2017-03-04 NOTE — Telephone Encounter (Signed)
Post ED Visit - Positive Culture Follow-up  Culture report reviewed by antimicrobial stewardship pharmacist:  [x]  Elenor Quinones, Pharm.D. []  Heide Guile, Pharm.D., BCPS AQ-ID []  Parks Neptune, Pharm.D., BCPS []  Alycia Rossetti, Pharm.D., BCPS []  [place], [place].D., BCPS, AAHIVP []  Legrand Como, Pharm.D., BCPS, AAHIVP []  Salome Arnt, PharmD, BCPS []  Dimitri Ped, PharmD, BCPS []  Vincenza Hews, PharmD, BCPS  Positive urine culture Treated with Cephalexin, organism sensitive to the same and no further patient follow-up is required at this time.  Genia Del 03/04/2017, 9:35 AM

## 2017-03-14 ENCOUNTER — Encounter (HOSPITAL_COMMUNITY): Payer: Self-pay | Admitting: Emergency Medicine

## 2017-03-14 ENCOUNTER — Emergency Department (HOSPITAL_COMMUNITY)
Admission: EM | Admit: 2017-03-14 | Discharge: 2017-03-14 | Disposition: A | Discharge status: Home / Self Care | Payer: Self-pay | Attending: Emergency Medicine | Admitting: Emergency Medicine

## 2017-03-14 ENCOUNTER — Other Ambulatory Visit: Payer: Self-pay

## 2017-03-14 DIAGNOSIS — J449 Chronic obstructive pulmonary disease, unspecified: Secondary | ICD-10-CM | POA: Insufficient documentation

## 2017-03-14 DIAGNOSIS — Z87891 Personal history of nicotine dependence: Secondary | ICD-10-CM | POA: Insufficient documentation

## 2017-03-14 DIAGNOSIS — K047 Periapical abscess without sinus: Secondary | ICD-10-CM | POA: Insufficient documentation

## 2017-03-14 DIAGNOSIS — K0889 Other specified disorders of teeth and supporting structures: Secondary | ICD-10-CM

## 2017-03-14 DIAGNOSIS — I10 Essential (primary) hypertension: Secondary | ICD-10-CM | POA: Insufficient documentation

## 2017-03-14 DIAGNOSIS — Z79899 Other long term (current) drug therapy: Secondary | ICD-10-CM | POA: Insufficient documentation

## 2017-03-14 DIAGNOSIS — K029 Dental caries, unspecified: Secondary | ICD-10-CM | POA: Insufficient documentation

## 2017-03-14 LAB — COMPREHENSIVE METABOLIC PANEL
ALT: 19 U/L (ref 14–54)
AST: 26 U/L (ref 15–41)
Albumin: 3.2 g/dL — ABNORMAL LOW (ref 3.5–5.0)
Alkaline Phosphatase: 100 U/L (ref 38–126)
Anion gap: 12 (ref 5–15)
BUN: 13 mg/dL (ref 6–20)
CO2: 24 mmol/L (ref 22–32)
Calcium: 9.1 mg/dL (ref 8.9–10.3)
Chloride: 103 mmol/L (ref 101–111)
Creatinine, Ser: 0.91 mg/dL (ref 0.44–1.00)
GFR calc Af Amer: >60 mL/min (ref >60)
GFR calc non Af Amer: >60 mL/min (ref >60)
Glucose, Bld: 144 mg/dL — ABNORMAL HIGH (ref 65–99)
Potassium: 3.5 mmol/L (ref 3.5–5.1)
Sodium: 139 mmol/L (ref 135–145)
Total Bilirubin: 0.4 mg/dL (ref 0.3–1.2)
Total Protein: 6.2 g/dL — ABNORMAL LOW (ref 6.5–8.1)

## 2017-03-14 LAB — CBC WITH DIFFERENTIAL/PLATELET
Basophils Absolute: 0.1 10*3/uL (ref 0.0–0.1)
Basophils Relative: 1 %
Eosinophils Absolute: 0.5 10*3/uL (ref 0.0–0.7)
Eosinophils Relative: 5 %
HCT: 39.3 % (ref 36.0–46.0)
Hemoglobin: 12.7 g/dL (ref 12.0–15.0)
Lymphocytes Relative: 23 %
Lymphs Abs: 2 10*3/uL (ref 0.7–4.0)
MCH: 29.2 pg (ref 26.0–34.0)
MCHC: 32.3 g/dL (ref 30.0–36.0)
MCV: 90.3 fL (ref 78.0–100.0)
Monocytes Absolute: 0.6 10*3/uL (ref 0.1–1.0)
Monocytes Relative: 6 %
Neutro Abs: 5.7 10*3/uL (ref 1.7–7.7)
Neutrophils Relative %: 65 %
Platelets: 360 10*3/uL (ref 150–400)
RBC: 4.35 MIL/uL (ref 3.87–5.11)
RDW: 14.6 % (ref 11.5–15.5)
WBC: 8.8 10*3/uL (ref 4.0–10.5)

## 2017-03-14 LAB — I-STAT CG4 LACTIC ACID, ED
Lactic Acid, Venous: 2.65 mmol/L (ref 0.5–1.9)
Lactic Acid, Venous: 1.35 mmol/L (ref 0.5–1.9)

## 2017-03-14 MED ORDER — AMOXICILLIN-POT CLAVULANATE 875-125 MG PO TABS: 1.0000 | ORAL_TABLET | Freq: Two times a day (BID) | ORAL | 0 refills | Status: AC

## 2017-03-14 MED ORDER — AMOXICILLIN-POT CLAVULANATE 875-125 MG PO TABS: 1.0000 | ORAL_TABLET | Freq: Two times a day (BID) | ORAL | Status: DC

## 2017-03-14 MED ORDER — HYDROCODONE-ACETAMINOPHEN 5-325 MG PO TABS
1.0000 | ORAL_TABLET | Freq: Once | ORAL | Status: AC
  Administered 2017-03-14: 1 via ORAL
  Filled 2017-03-14: qty 1

## 2017-03-14 MED ORDER — KETOROLAC TROMETHAMINE 15 MG/ML IJ SOLN
15.0000 mg | Freq: Once | INTRAMUSCULAR | Status: AC
  Administered 2017-03-14: 15 mg via INTRAVENOUS
  Filled 2017-03-14: qty 1

## 2017-03-14 MED ORDER — HYDROCODONE-ACETAMINOPHEN 5-325 MG PO TABS: 2.0000 | ORAL_TABLET | Freq: Once | ORAL | Status: DC

## 2017-03-14 MED ORDER — DEXAMETHASONE SODIUM PHOSPHATE 10 MG/ML IJ SOLN
10.0000 mg | Freq: Once | INTRAMUSCULAR | Status: AC
  Administered 2017-03-14: 10 mg via INTRAVENOUS
  Filled 2017-03-14: qty 1

## 2017-03-14 MED ORDER — AMPICILLIN-SULBACTAM SODIUM 3 (2-1) G IJ SOLR
3.0000 g | Freq: Once | INTRAMUSCULAR | Status: AC
  Administered 2017-03-14: 3 g via INTRAVENOUS
  Filled 2017-03-14: qty 3

## 2017-03-14 MED ORDER — IBUPROFEN 600 MG PO TABS: 600.0000 mg | ORAL_TABLET | Freq: Four times a day (QID) | ORAL | 0 refills | Status: DC | PRN

## 2017-03-14 MED ORDER — SODIUM CHLORIDE 0.9 % IV BOLUS (SEPSIS)
1000.0000 mL | Freq: Once | INTRAVENOUS | Status: AC
  Administered 2017-03-14: 1000 mL via INTRAVENOUS

## 2017-03-14 NOTE — ED Provider Notes (Signed)
[facility] EMERGENCY DEPARTMENT Provider Note   CSN: [id number] Arrival date & time: 03/14/17  0803     History   Chief Complaint Chief Complaint  Patient presents with  . Dental Problem  . Facial Swelling    HPI Monica Scott is a 61 y.o. female.  HPI   61 year old female with history of recurrent dental infections here with right upper central incisor pain and facial swelling.  The patient states that her symptoms started gradually yesterday.  She describes an aching, throbbing, severe pain in her tooth that extends now up towards her eye.  She denies any actual eye pain or pain with extraocular movements.  Denies any vision changes or double vision.  She feels some mild fullness in her right nostril with no drainage or discharge.  She has a history of recurrent dental infections and states that essentially all of her teeth are "rotten."  She does not regularly see a dentist.  Denies any fevers.  She did have some chills last night.  She has had no nausea or vomiting.  Pain is worse with any kind of palpation or attempted eating.  Past Medical History:  Diagnosis Date  . Asthma   . Bladder disease   . COPD (chronic obstructive pulmonary disease) ([place])   . Depression   . Hyperlipidemia   . Hypertension   . Peptic ulcer     Patient Active Problem List   Diagnosis Date Noted  . Hypertension, essential, benign 02/19/2017  . COPD (chronic obstructive pulmonary disease) ([place]) 02/11/2017    Past Surgical History:  Procedure Laterality Date  . ABDOMINAL HYSTERECTOMY      OB History    No data available       Home Medications    Prior to Admission medications   Medication Sig Start Date End Date Taking? Authorizing Provider  albuterol (PROVENTIL) (2.5 MG/3ML) 0.083% nebulizer solution Take 3 mLs (2.5 mg total) 3 (three) times daily as needed by nebulization for wheezing or shortness of breath. 02/11/17  Yes Martinique, Betty G, MD    Aspirin-Salicylamide-Caffeine [facility] HEADACHE POWDER PO) Take 1 Package by mouth daily as needed (pain).   Yes [provider]  Fluoxetine HCl, PMDD, 20 MG TABS Take 1 tablet (20 mg total) by mouth once. Patient taking differently: Take 40 mg by mouth daily.  12/21/14  Yes Nehemiah Settle, NP  gabapentin (NEURONTIN) 400 MG capsule Take 300 mg by mouth 2 (two) times daily.    Yes [provider]  lisinopril (PRINIVIL,ZESTRIL) 20 MG tablet Take 1 tablet (20 mg total) daily by mouth. 02/11/17  Yes Martinique, Betty G, MD  OLANZapine (ZYPREXA) 5 MG tablet Take 5 mg by mouth at bedtime.   Yes [provider]  predniSONE (DELTASONE) 20 MG tablet Take 3 tablets (60 mg total) by mouth daily. 02/27/17  Yes Law, Bea Graff, PA-C  traZODone (DESYREL) 100 MG tablet Take 100 mg by mouth at bedtime.   Yes [provider]  budesonide (PULMICORT) 1 MG/2ML nebulizer solution Take 2 mLs (1 mg total) 2 (two) times daily by nebulization. Patient not taking: Reported on 03/14/2017 02/11/17   Martin[place], Betty G, MD  budesonide-formoterol [facility]) 160-4.5 MCG/ACT inhaler Inhale 2 puffs into the lungs daily. Patient not taking: Reported on 03/14/2017 02/27/17   Frederica Kuster, PA-C  cephALEXin (KEFLEX) 500 MG capsule Take 1 capsule (500 mg total) by mouth 2 (two) times daily. Patient not taking: Reported on 03/14/2017 02/27/17  Law, Alexandra M, PA-C  ibuprofen (ADVIL,MOTRIN) 600 MG tablet Take 1 tablet (600 mg total) by mouth every 6 (six) hours as needed for moderate pain. 03/14/17   Duffy Bruce, MD    Family History Family History  Problem Relation Age of Onset  . Alcohol abuse Mother   . Arthritis Mother   . Cancer Mother   . Depression Mother   . Early death Mother   . Alcohol abuse Father   . Depression Father   . Alcohol abuse Sister   . Depression Sister   . Alcohol abuse Brother   . Alcohol abuse Daughter   . Depression Daughter   . Alcohol abuse Son   . COPD  Maternal Grandfather   . Heart disease Maternal Grandfather   . Hypertension Maternal Grandfather     Social History Social History   Tobacco Use  . Smoking status: Former Research scientist (life sciences)  . Smokeless tobacco: Never Used  Substance Use Topics  . Alcohol use: No    Comment: Former ETOH  . Drug use: No     Allergies   Patient has no known allergies.   Review of Systems Review of Systems  Constitutional: Positive for chills and fatigue. Negative for fever.  HENT: Positive for dental problem. Negative for congestion and rhinorrhea.   Eyes: Negative for visual disturbance.  Respiratory: Negative for cough, shortness of breath and wheezing.   Cardiovascular: Negative for chest pain and leg swelling.  Gastrointestinal: Negative for abdominal pain, diarrhea, nausea and vomiting.  Genitourinary: Negative for dysuria and flank pain.  Musculoskeletal: Negative for neck pain and neck stiffness.  Skin: Negative for rash and wound.  Allergic/Immunologic: Negative for immunocompromised state.  Neurological: Negative for syncope, weakness and headaches.  All other systems reviewed and are negative.    Physical Exam Updated Vital Signs BP 131/79   Pulse 78   Temp 98.2 F (36.8 C) (Oral)   Resp 18   Ht 4\' 11"  (1.499 m)   Wt 63.5 kg (140 lb)   SpO2 94%   BMI 28.28 kg/m   Physical Exam  Constitutional: She is oriented to person, place, and time. She appears well-developed and well-nourished. No distress.  HENT:  Head: Normocephalic and atraumatic.  Markedly poor dentition with active dental caries and essentially all teeth with associated mild gingivitis.  There is significant tenderness and gingival edema of the right central incisor extending up towards the nose.  There is no appreciable abscess or fluctuance.  No apparent abscess on the palate.  There is some mild erythema of her right maxillary cheek but she has no overt swelling or fluctuance.  No proptosis or extraocular  abnormalities.  No pain with extraocular movements.  Vision is intact.  No sublingual swelling or edema.  Eyes: Conjunctivae are normal.  Neck: Neck supple.  Cardiovascular: Normal rate, regular rhythm and normal heart sounds. Exam reveals no friction rub.  No murmur heard. Pulmonary/Chest: Effort normal and breath sounds normal. No respiratory distress. She has no wheezes. She has no rales.  Abdominal: She exhibits no distension.  Musculoskeletal: She exhibits no edema.  Neurological: She is alert and oriented to person, place, and time. She exhibits normal muscle tone.  Skin: Skin is warm. Capillary refill takes less than 2 seconds.  Psychiatric: She has a normal mood and affect.  Nursing note and vitals reviewed.    ED Treatments / Results  Labs (all labs ordered are listed, but only abnormal results are displayed) Labs Reviewed  COMPREHENSIVE METABOLIC  PANEL - Abnormal; Notable for the following components:      Result Value   Glucose, Bld 144 (*)    Total Protein 6.2 (*)    Albumin 3.2 (*)    All other components within normal limits  I-STAT CG4 LACTIC ACID, ED - Abnormal; Notable for the following components:   Lactic Acid, Venous 2.65 (*)    All other components within normal limits  CBC WITH DIFFERENTIAL/PLATELET  I-STAT CG4 LACTIC ACID, ED    EKG  EKG Interpretation None       Radiology No results found.  Procedures Procedures (including critical care time)  Medications Ordered in ED Medications  sodium chloride 0.9 % bolus 1,000 mL (1,000 mLs Intravenous New Bag/Given 03/14/17 1216)  amoxicillin-clavulanate (AUGMENTIN) 875-125 MG per tablet 1 tablet (not administered)  Ampicillin-Sulbactam (UNASYN) 3 g in sodium chloride 0.9 % 100 mL IVPB (3 g Intravenous New Bag/Given 03/14/17 1216)  dexamethasone (DECADRON) injection 10 mg (10 mg Intravenous Given 03/14/17 1218)  ketorolac (TORADOL) 15 MG/ML injection 15 mg (15 mg Intravenous Given 03/14/17 1218)    HYDROcodone-acetaminophen (NORCO/VICODIN) 5-325 MG per tablet 1 tablet (1 tablet Oral Given 03/14/17 1213)     Initial Impression / Assessment and Plan / ED Course  I have reviewed the triage vital signs and the nursing notes.  Pertinent labs & imaging results that were available during my care of the patient were reviewed by me and considered in my medical decision making (see chart for details).     61 year old female with past medical history as above here with right central incisor pulpitis and likely developing dental abscess.  She does have a very mild area of erythema on the right maxillary cheek but there is no appreciable fluctuance or induration here.  There is no apparent extension to the orbit.  No sublingual swelling, edema, or symptoms to suggest Ludwick's angina.  Patient initially did have a mildly activated lactic acid in the waiting room, which is obtained via nurse triage, but this has since cleared despite not receiving any fluids prior to repeat.  I suspect this is likely due to mild dehydration versus lab draw.  She is otherwise very well-appearing and afebrile.  I discussed treatment options with the patient.  We discussed possible imaging.  At this time, have a very low suspicion for significant dental abscess that would be treated other than with antibiotics.  I also do not suspect orbital cellulitis.  She is in agreement and is but not feel she needs a CT at this time.  Will give her a dose of Unasyn here given that she does have an IV as well as fluids, and discharged with outpatient Augmentin and dentistry f/u.  Final Clinical Impressions(s) / ED Diagnoses   Final diagnoses:  Dental abscess  Dental caries  Pain, dental    ED Discharge Orders        Ordered    ibuprofen (ADVIL,MOTRIN) 600 MG tablet  Every 6 hours PRN     03/14/17 1246       Duffy Bruce, MD 03/14/17 1248

## 2017-03-14 NOTE — ED Triage Notes (Signed)
Pt reports dental pain to front upper tooth x 3-4 days, reports new swelling under R eye. Pt states, "That's the infection from my tooth." Pt denies difficulty swallowing or eating.

## 2017-03-14 NOTE — ED Notes (Signed)
Pt ambulated to restroom. 

## 2017-03-14 NOTE — ED Notes (Addendum)
Pt ambulated from waiting room to room pt tolerated

## 2017-03-18 ENCOUNTER — Other Ambulatory Visit: Payer: Self-pay | Admitting: Family Medicine

## 2017-03-18 ENCOUNTER — Ambulatory Visit: Payer: Self-pay

## 2017-03-18 MED ORDER — AMLODIPINE BESYLATE 5 MG PO TABS: 5.0000 mg | ORAL_TABLET | Freq: Every day | ORAL | 1 refills | Status: DC

## 2017-03-18 NOTE — Telephone Encounter (Signed)
Amlodipine 5 mg daily can be added to Lisinopril 20 mg daily. She can take both meds at bedtime or Lisinopril Am and Amlodipine pm.  I will send Rx. F/U in 4 weeks.  Thanks, BJ

## 2017-03-18 NOTE — Telephone Encounter (Signed)
Patient called in with c/o "high BP readings" stated"not coming down on the Lisinopril 20mg  daily and it was increased last month when I saw the doctor, but it is not strong enough for me. I need something else." Readings in assessment below for today. Denies missing doses of medication, reports a "slight headache, rated a 2," denies dizziness, numbness, weakness at present.  Patient did report feeling "tingling on my cheek last week." Protocol indicates patient to be seen in the office within 3 days, patient states "I can't afford to pay the office visit, so if it gets any worse, I will go to the ED to get them to change my medicine."   Reason for Disposition . Systolic BP  >= 741 OR Diastolic >= 287  Answer Assessment - Initial Assessment Questions 1. BLOOD PRESSURE: "What is the blood pressure?" "Did you take at least two measurements 5 minutes apart?"     4pm 176/93;  10am BP 190/96; 186/93 today at [facility]; 2. ONSET: "When did you take your blood pressure?"     4pm 3. HOW: "How did you obtain the blood pressure?" (e.g., visiting nurse, automatic home BP monitor)     Automatic home BP monitor 4. HISTORY: "Do you have a history of high blood pressure?"     Yes 5. MEDICATIONS: "Are you taking any medications for blood pressure?" "Have you missed any doses recently?"     Yes, no missed doses 6. OTHER SYMPTOMS: "Do you have any symptoms?" (e.g., headache, chest pain, blurred vision, difficulty breathing, weakness)     Slight headache (2 on scale 1-10) 7. PREGNANCY: "Is there any chance you are pregnant?" "When was your last menstrual period?"     N/A  Protocols used: HIGH BLOOD PRESSURE-A-AH

## 2017-03-21 NOTE — Telephone Encounter (Signed)
Left message to give clinic a call back. 

## 2017-03-25 NOTE — Telephone Encounter (Signed)
I spoke with pt and went over advice, pt agreed.

## 2017-04-14 ENCOUNTER — Emergency Department (HOSPITAL_BASED_OUTPATIENT_CLINIC_OR_DEPARTMENT_OTHER)
Admission: EM | Admit: 2017-04-14 | Discharge: 2017-04-14 | Disposition: A | Discharge status: Home / Self Care | Payer: Self-pay | Attending: Emergency Medicine | Admitting: Emergency Medicine

## 2017-04-14 ENCOUNTER — Encounter (HOSPITAL_BASED_OUTPATIENT_CLINIC_OR_DEPARTMENT_OTHER): Payer: Self-pay

## 2017-04-14 ENCOUNTER — Other Ambulatory Visit: Payer: Self-pay

## 2017-04-14 DIAGNOSIS — I10 Essential (primary) hypertension: Secondary | ICD-10-CM | POA: Insufficient documentation

## 2017-04-14 DIAGNOSIS — Z79899 Other long term (current) drug therapy: Secondary | ICD-10-CM | POA: Insufficient documentation

## 2017-04-14 DIAGNOSIS — N12 Tubulo-interstitial nephritis, not specified as acute or chronic: Secondary | ICD-10-CM | POA: Insufficient documentation

## 2017-04-14 DIAGNOSIS — J449 Chronic obstructive pulmonary disease, unspecified: Secondary | ICD-10-CM | POA: Insufficient documentation

## 2017-04-14 DIAGNOSIS — J45909 Unspecified asthma, uncomplicated: Secondary | ICD-10-CM | POA: Insufficient documentation

## 2017-04-14 DIAGNOSIS — Z7982 Long term (current) use of aspirin: Secondary | ICD-10-CM | POA: Insufficient documentation

## 2017-04-14 DIAGNOSIS — Z87891 Personal history of nicotine dependence: Secondary | ICD-10-CM | POA: Insufficient documentation

## 2017-04-14 LAB — CBC WITH DIFFERENTIAL/PLATELET
Basophils Absolute: 0.1 10*3/uL (ref 0.0–0.1)
Basophils Relative: 1 %
Eosinophils Absolute: 0.1 10*3/uL (ref 0.0–0.7)
Eosinophils Relative: 2 %
HCT: 34.9 % — ABNORMAL LOW (ref 36.0–46.0)
Hemoglobin: 11.6 g/dL — ABNORMAL LOW (ref 12.0–15.0)
Lymphocytes Relative: 27 %
Lymphs Abs: 2 10*3/uL (ref 0.7–4.0)
MCH: 29.1 pg (ref 26.0–34.0)
MCHC: 33.2 g/dL (ref 30.0–36.0)
MCV: 87.5 fL (ref 78.0–100.0)
Monocytes Absolute: 1.2 10*3/uL — ABNORMAL HIGH (ref 0.1–1.0)
Monocytes Relative: 17 %
Neutro Abs: 3.9 10*3/uL (ref 1.7–7.7)
Neutrophils Relative %: 53 %
Platelets: 362 10*3/uL (ref 150–400)
RBC: 3.99 MIL/uL (ref 3.87–5.11)
RDW: 14.4 % (ref 11.5–15.5)
WBC: 7.3 10*3/uL (ref 4.0–10.5)

## 2017-04-14 LAB — COMPREHENSIVE METABOLIC PANEL
ALT: 20 U/L (ref 14–54)
AST: 23 U/L (ref 15–41)
Albumin: 3.5 g/dL (ref 3.5–5.0)
Alkaline Phosphatase: 83 U/L (ref 38–126)
Anion gap: 8 (ref 5–15)
BUN: 16 mg/dL (ref 6–20)
CO2: 24 mmol/L (ref 22–32)
Calcium: 8.6 mg/dL — ABNORMAL LOW (ref 8.9–10.3)
Chloride: 100 mmol/L — ABNORMAL LOW (ref 101–111)
Creatinine, Ser: 0.73 mg/dL (ref 0.44–1.00)
GFR calc Af Amer: >60 mL/min (ref >60)
GFR calc non Af Amer: >60 mL/min (ref >60)
Glucose, Bld: 93 mg/dL (ref 65–99)
Potassium: 3.8 mmol/L (ref 3.5–5.1)
Sodium: 132 mmol/L — ABNORMAL LOW (ref 135–145)
Total Bilirubin: 0.4 mg/dL (ref 0.3–1.2)
Total Protein: 7 g/dL (ref 6.5–8.1)

## 2017-04-14 LAB — URINALYSIS, MICROSCOPIC (REFLEX)

## 2017-04-14 LAB — URINALYSIS, ROUTINE W REFLEX MICROSCOPIC
Bilirubin Urine: NEGATIVE
Glucose, UA: NEGATIVE mg/dL
Ketones, ur: NEGATIVE mg/dL
Leukocytes, UA: NEGATIVE
Nitrite: NEGATIVE
Protein, ur: NEGATIVE mg/dL
Specific Gravity, Urine: <1.005 — ABNORMAL LOW (ref 1.005–1.030)
pH: 6 (ref 5.0–8.0)

## 2017-04-14 MED ORDER — CEPHALEXIN 500 MG PO CAPS: 500.0000 mg | ORAL_CAPSULE | Freq: Four times a day (QID) | ORAL | 0 refills | Status: DC

## 2017-04-14 MED FILL — CEPHALEXIN 500 MG CAPSULE: 500 | 10 days supply | Qty: 40 | Fill #0

## 2017-04-14 NOTE — ED Triage Notes (Signed)
Pt c/o left flank and abd pain x 3 days-states she self caths and feels she had "kidney infection"-NAD-steady gait

## 2017-04-14 NOTE — Discharge Instructions (Signed)
You were seen in the emergency department and diagnosed with pyelonephritis- please see the attached hand out for further information regarding this.   We will treat this infection with Keflex, this is an antibiotic. Please take all of your antibiotics until finished. You may develop abdominal discomfort or diarrhea from the antibiotic.  You may help offset this with probiotics which you can buy at the store (ask your pharmacist if unable to find) or get probiotics in the form of eating yogurt. Do not eat or take the probiotics until 2 hours after your antibiotic. If you are unable to tolerate these side effects follow-up with your primary care provider or return to the emergency department.   If you begin to experience any blistering, rashes, swelling, or difficulty breathing seek medical care for evaluation of potentially more serious side effects.   Please be aware that this medication may interact with other medications you are taking, please be sure to discuss your medication list with your pharmacist.   Take Tylenol for the discomfort that you are experiencing.  Follow-up with your primary care provider in the next 3-5 days for reevaluation.  Return to the emergency department for any new or worsening symptoms including but not limited to fever, nausea, vomiting, worsening of your pain, or any other concerns.

## 2017-04-14 NOTE — ED Provider Notes (Signed)
[facility] EMERGENCY DEPARTMENT Provider Note   CSN: [id number] Arrival date & time: 04/14/17  1103     History   Chief Complaint Chief Complaint  Patient presents with  . Flank Pain    HPI ALVILDA MCKENNA is a 62 y.o. female with a hx of self catheterization, HTN, COPD, and hyperlipidemia who presents to the ED with complaint of intermittent L flank pain x 3 days. Patient states pain is to L flank and wraps around to L abdomen. States pain is a throbbing sensation that occurs frequently throughout the day, episodes last about 10-15 minutes. Patient is not in pain at present. Pain is a 5/10 at worst. No alleviating/aggravating factors.  Associated nausea, chills, urgency, and frequency with her self-catheterization.  Has noted that the urine is darker in color, no gross blood.  Treated for a UTI 1 month prior with amoxicillin, states that she completed this course. Denies fever, vomiting, diarrhea, constipation, blood in stool, or vaginal bleeding/vaginal discharge.   HPI  Past Medical History:  Diagnosis Date  . Asthma   . Bladder disease   . COPD (chronic obstructive pulmonary disease) ([place])   . Depression   . Hyperlipidemia   . Hypertension   . Peptic ulcer     Patient Active Problem List   Diagnosis Date Noted  . Hypertension, essential, benign 02/19/2017  . COPD (chronic obstructive pulmonary disease) ([place]) 02/11/2017    Past Surgical History:  Procedure Laterality Date  . ABDOMINAL HYSTERECTOMY      OB History    No data available       Home Medications    Prior to Admission medications   Medication Sig Start Date End Date Taking? Authorizing Provider  albuterol (PROVENTIL) (2.5 MG/3ML) 0.083% nebulizer solution Take 3 mLs (2.5 mg total) 3 (three) times daily as needed by nebulization for wheezing or shortness of breath. 02/11/17   [place], Betty G, MD  amLODipine (NORVASC) 5 MG tablet Take 1 tablet (5 mg total) by mouth daily. 03/18/17   [place],  Betty G, MD  Aspirin-Salicylamide-Caffeine ([facility] HEADACHE POWDER PO) Take 1 Package by mouth daily as needed (pain).    [provider]  budesonide (PULMICORT) 1 MG/2ML nebulizer solution Take 2 mLs (1 mg total) 2 (two) times daily by nebulization. Patient not taking: Reported on 03/14/2017 02/11/17   [place], Betty G, MD  budesonide-formoterol [facility]) 160-4.5 MCG/ACT inhaler Inhale 2 puffs into the lungs daily. Patient not taking: Reported on 03/14/2017 02/27/17   Frederica Kuster, PA-C  cephALEXin (KEFLEX) 500 MG capsule Take 1 capsule (500 mg total) by mouth 2 (two) times daily. Patient not taking: Reported on 03/14/2017 02/27/17   Frederica Kuster, PA-C  Fluoxetine HCl, PMDD, 20 MG TABS Take 1 tablet (20 mg total) by mouth once. Patient taking differently: Take 40 mg by mouth daily.  12/21/14   Nehemiah Settle, NP  gabapentin (NEURONTIN) 400 MG capsule Take 300 mg by mouth 2 (two) times daily.     [provider]  ibuprofen (ADVIL,MOTRIN) 600 MG tablet Take 1 tablet (600 mg total) by mouth every 6 (six) hours as needed for moderate pain. 03/14/17   Duffy Bruce, MD  lisinopril (PRINIVIL,ZESTRIL) 20 MG tablet Take 1 tablet (20 mg total) daily by mouth. 02/11/17   [place], Betty G, MD  OLANZapine (ZYPREXA) 5 MG tablet Take 5 mg by mouth at bedtime.    [provider]  predniSONE (DELTASONE) 20 MG tablet Take 3 tablets (60 mg  total) by mouth daily. 02/27/17   Law, Bea Graff, PA-C  traZODone (DESYREL) 100 MG tablet Take 100 mg by mouth at bedtime.    [provider]    Family History Family History  Problem Relation Age of Onset  . Alcohol abuse Mother   . Arthritis Mother   . Cancer Mother   . Depression Mother   . Early death Mother   . Alcohol abuse Father   . Depression Father   . Alcohol abuse Sister   . Depression Sister   . Alcohol abuse Brother   . Alcohol abuse Daughter   . Depression Daughter   . Alcohol abuse Son   . COPD Maternal  Grandfather   . Heart disease Maternal Grandfather   . Hypertension Maternal Grandfather     Social History Social History   Tobacco Use  . Smoking status: Former Research scientist (life sciences)  . Smokeless tobacco: Never Used  Substance Use Topics  . Alcohol use: No    Comment: Former ETOH  . Drug use: No     Allergies   Patient has no known allergies.   Review of Systems Review of Systems  Constitutional: Positive for chills. Negative for fever.  Respiratory: Negative for shortness of breath.   Cardiovascular: Negative for chest pain.  Gastrointestinal: Positive for nausea. Negative for blood in stool, constipation, diarrhea and vomiting.  Genitourinary: Positive for flank pain, frequency and urgency. Negative for vaginal bleeding and vaginal discharge.  All other systems reviewed and are negative.   Physical Exam Updated Vital Signs BP 112/76 (BP Location: Left Arm)   Pulse 90   Temp 98.7 F (37.1 C) (Oral)   Resp 18   Ht 4\' 11"  (1.499 m)   Wt 67.6 kg (149 lb)   SpO2 90% Comment: pt NAD-denies SOB-states she was told she needed home O2 but unable to afford  BMI 30.09 kg/m   Physical Exam  Constitutional: She appears well-developed and well-nourished.  Non-toxic appearance. No distress.  HENT:  Head: Normocephalic and atraumatic.  Eyes: Conjunctivae are normal. Right eye exhibits no discharge. Left eye exhibits no discharge.  Cardiovascular: Normal rate and regular rhythm.  No murmur heard. Pulmonary/Chest: Breath sounds normal. No respiratory distress. She has no rales.  Abdominal: Soft. She exhibits no distension. There is no tenderness. There is CVA tenderness (L\). There is no rigidity, no rebound, no guarding and no tenderness at McBurney's point.  Neurological: She is alert.  Clear speech.   Skin: Skin is warm and dry. No rash noted.  Psychiatric: She has a normal mood and affect. Her behavior is normal.  Nursing note and vitals reviewed.  ED Treatments / Results   Labs Results for orders placed or performed during the hospital encounter of 04/14/17  Urinalysis, Routine w reflex microscopic  Result Value Ref Range   Color, Urine YELLOW YELLOW   APPearance CLEAR CLEAR   Specific Gravity, Urine <1.005 (L) 1.005 - 1.030   pH 6.0 5.0 - 8.0   Glucose, UA NEGATIVE NEGATIVE mg/dL   Hgb urine dipstick TRACE (A) NEGATIVE   Bilirubin Urine NEGATIVE NEGATIVE   Ketones, ur NEGATIVE NEGATIVE mg/dL   Protein, ur NEGATIVE NEGATIVE mg/dL   Nitrite NEGATIVE NEGATIVE   Leukocytes, UA NEGATIVE NEGATIVE  Comprehensive metabolic panel  Result Value Ref Range   Sodium 132 (L) 135 - 145 mmol/L   Potassium 3.8 3.5 - 5.1 mmol/L   Chloride 100 (L) 101 - 111 mmol/L   CO2 24 22 - 32 mmol/L  Glucose, Bld 93 65 - 99 mg/dL   BUN 16 6 - 20 mg/dL   Creatinine, Ser 0.73 0.44 - 1.00 mg/dL   Calcium 8.6 (L) 8.9 - 10.3 mg/dL   Total Protein 7.0 6.5 - 8.1 g/dL   Albumin 3.5 3.5 - 5.0 g/dL   AST 23 15 - 41 U/L   ALT 20 14 - 54 U/L   Alkaline Phosphatase 83 38 - 126 U/L   Total Bilirubin 0.4 0.3 - 1.2 mg/dL   GFR calc non Af Amer >60 >60 mL/min   GFR calc Af Amer >60 >60 mL/min   Anion gap 8 5 - 15  CBC with Differential  Result Value Ref Range   WBC 7.3 4.0 - 10.5 K/uL   RBC 3.99 3.87 - 5.11 MIL/uL   Hemoglobin 11.6 (L) 12.0 - 15.0 g/dL   HCT 34.9 (L) 36.0 - 46.0 %   MCV 87.5 78.0 - 100.0 fL   MCH 29.1 26.0 - 34.0 pg   MCHC 33.2 30.0 - 36.0 g/dL   RDW 14.4 11.5 - 15.5 %   Platelets 362 150 - 400 K/uL   Neutrophils Relative % 53 %   Neutro Abs 3.9 1.7 - 7.7 K/uL   Lymphocytes Relative 27 %   Lymphs Abs 2.0 0.7 - 4.0 K/uL   Monocytes Relative 17 %   Monocytes Absolute 1.2 (H) 0.1 - 1.0 K/uL   Eosinophils Relative 2 %   Eosinophils Absolute 0.1 0.0 - 0.7 K/uL   Basophils Relative 1 %   Basophils Absolute 0.1 0.0 - 0.1 K/uL  Urinalysis, Microscopic (reflex)  Result Value Ref Range   RBC / HPF 0-5 0 - 5 RBC/hpf   WBC, UA 0-5 0 - 5 WBC/hpf   Bacteria, UA MANY  (A) NONE SEEN   Squamous Epithelial / LPF 0-5 (A) NONE SEEN   Mucus PRESENT    No results found. EKG  EKG Interpretation None      Radiology No results found.  Procedures Procedures (including critical care time)  Medications Ordered in ED Medications - No data to display  Initial Impression / Assessment and Plan / ED Course  I have reviewed the triage vital signs and the nursing notes.  Pertinent labs & imaging results that were available during my care of the patient were reviewed by me and considered in my medical decision making (see chart for details).  Patient presents with left flank pain, associated urinary sxs, chills, and nausea. She is nontoxic appearing, vitals are WNL. Left CVA tenderness on exam, otherwise non-tender. Patient with benign abdominal exam, only area of tenderness is L CVA tenderness.There are no peritoneal signs.  No indication of appendicitis, bowel obstruction, bowel perforation, cholecystitis, or diverticulitis. Nephrolithiasis is a possibility, less suspicion for this. Will evaluate with lab work. Patient not in pain at present, will hold off on analgesics, instructed patient to make [place] aware if pain returns.   Labs grossly unremarkable other than UA. Patient's CBC fairly consistent with baseline- hgb slightly lower at 11.6, typically at 12. Of note no leukocytosis, BUN/creatinine WNL. UA consistent with infection- will treat for pyelonephritis.   Patient afebrile, tolerating PO, will discharge home with Keflex. I discussed results, treatment plan, need for PCP follow-up, and return precautions with the patient. Provided opportunity for questions, patient confirmed understanding and is in agreement with plan.     Findings and plan of care discussed with supervising physician Dr. Ralene Bathe who personally evaluated and examined this patient in agreement  with plan.  Vitals:   04/14/17 1126 04/14/17 1349  BP: 112/76 112/80  Pulse: 90 80  Resp: 18 20  Temp:  98.7 F (37.1 C)   SpO2: 90% 91%    Final Clinical Impressions(s) / ED Diagnoses   Final diagnoses:  Pyelonephritis    ED Discharge Orders        Ordered    cephALEXin (KEFLEX) 500 MG capsule  4 times daily     04/14/17 1330       Nyeisha Goodall, [place], PA-C 04/14/17 1655    Quintella Reichert, MD 04/15/17 [phone]

## 2017-04-16 LAB — URINE CULTURE: Culture: >=100000 — AB

## 2017-04-17 ENCOUNTER — Telehealth: Payer: Self-pay

## 2017-04-17 NOTE — Telephone Encounter (Signed)
Post ED Visit - Positive Culture Follow-up  Culture report reviewed by antimicrobial stewardship pharmacist:  []  Elenor Quinones, Pharm.D. []  Heide Guile, Pharm.D., BCPS AQ-ID []  Parks Neptune, Pharm.D., BCPS []  Alycia Rossetti, Pharm.D., BCPS []  [place], Pharm.D., BCPS, AAHIVP []  Legrand Como, Pharm.D., BCPS, AAHIVP []  Salome Arnt, PharmD, BCPS []  Jalene Mullet, PharmD []  Vincenza Hews, PharmD, BCPS [facility] Pharm D Positive urine culture Treated with Cephalexin, organism sensitive to the same and no further patient follow-up is required at this time.  Genia Del 04/17/2017, 9:40 AM

## 2017-05-29 ENCOUNTER — Other Ambulatory Visit: Payer: Self-pay | Admitting: Family Medicine

## 2017-06-13 ENCOUNTER — Ambulatory Visit: Payer: Self-pay | Admitting: Family Medicine

## 2017-06-13 DIAGNOSIS — Z0289 Encounter for other administrative examinations: Secondary | ICD-10-CM

## 2017-06-29 ENCOUNTER — Other Ambulatory Visit: Payer: Self-pay

## 2017-06-29 ENCOUNTER — Emergency Department (HOSPITAL_BASED_OUTPATIENT_CLINIC_OR_DEPARTMENT_OTHER)
Admission: EM | Admit: 2017-06-29 | Discharge: 2017-06-29 | Disposition: A | Discharge status: Home / Self Care | Payer: Self-pay | Attending: Emergency Medicine | Admitting: Emergency Medicine

## 2017-06-29 ENCOUNTER — Encounter (HOSPITAL_BASED_OUTPATIENT_CLINIC_OR_DEPARTMENT_OTHER): Payer: Self-pay

## 2017-06-29 ENCOUNTER — Emergency Department (HOSPITAL_BASED_OUTPATIENT_CLINIC_OR_DEPARTMENT_OTHER): Payer: Self-pay

## 2017-06-29 DIAGNOSIS — N390 Urinary tract infection, site not specified: Secondary | ICD-10-CM | POA: Insufficient documentation

## 2017-06-29 DIAGNOSIS — J441 Chronic obstructive pulmonary disease with (acute) exacerbation: Secondary | ICD-10-CM | POA: Insufficient documentation

## 2017-06-29 DIAGNOSIS — Z79899 Other long term (current) drug therapy: Secondary | ICD-10-CM | POA: Insufficient documentation

## 2017-06-29 DIAGNOSIS — Z87891 Personal history of nicotine dependence: Secondary | ICD-10-CM | POA: Insufficient documentation

## 2017-06-29 DIAGNOSIS — I10 Essential (primary) hypertension: Secondary | ICD-10-CM | POA: Insufficient documentation

## 2017-06-29 LAB — URINALYSIS, ROUTINE W REFLEX MICROSCOPIC
Bilirubin Urine: NEGATIVE
Glucose, UA: NEGATIVE mg/dL
Hgb urine dipstick: NEGATIVE
Ketones, ur: NEGATIVE mg/dL
Nitrite: NEGATIVE
Protein, ur: NEGATIVE mg/dL
Specific Gravity, Urine: <1.005 — ABNORMAL LOW (ref 1.005–1.030)
pH: 6 (ref 5.0–8.0)

## 2017-06-29 LAB — URINALYSIS, MICROSCOPIC (REFLEX)

## 2017-06-29 MED ORDER — IPRATROPIUM BROMIDE 0.02 % IN SOLN: 0.5000 mg | Freq: Once | RESPIRATORY_TRACT | Status: DC

## 2017-06-29 MED ORDER — ALBUTEROL SULFATE (2.5 MG/3ML) 0.083% IN NEBU
5.0000 mg | INHALATION_SOLUTION | Freq: Once | RESPIRATORY_TRACT | Status: DC
Start: 2017-06-29 — End: 2017-06-29

## 2017-06-29 MED ORDER — ALBUTEROL SULFATE (2.5 MG/3ML) 0.083% IN NEBU
2.5000 mg | INHALATION_SOLUTION | Freq: Once | RESPIRATORY_TRACT | Status: AC
  Administered 2017-06-29: 2.5 mg via RESPIRATORY_TRACT
  Filled 2017-06-29: qty 3

## 2017-06-29 MED ORDER — CEPHALEXIN 500 MG PO CAPS: 500.0000 mg | ORAL_CAPSULE | Freq: Four times a day (QID) | ORAL | 0 refills | Status: DC

## 2017-06-29 MED ORDER — ALBUTEROL SULFATE HFA 108 (90 BASE) MCG/ACT IN AERS: 2.0000 | INHALATION_SPRAY | RESPIRATORY_TRACT | 2 refills | Status: DC | PRN

## 2017-06-29 MED ORDER — IPRATROPIUM-ALBUTEROL 0.5-2.5 (3) MG/3ML IN SOLN
3.0000 mL | Freq: Once | RESPIRATORY_TRACT | Status: AC
  Administered 2017-06-29: 3 mL via RESPIRATORY_TRACT
  Filled 2017-06-29: qty 3

## 2017-06-29 MED ORDER — ALBUTEROL SULFATE (2.5 MG/3ML) 0.083% IN NEBU: 5.0000 mg | INHALATION_SOLUTION | Freq: Once | RESPIRATORY_TRACT | Status: DC

## 2017-06-29 MED ORDER — CEPHALEXIN 250 MG PO CAPS
1000.0000 mg | ORAL_CAPSULE | Freq: Once | ORAL | Status: AC
  Administered 2017-06-29: 1000 mg via ORAL
  Filled 2017-06-29: qty 4

## 2017-06-29 MED ORDER — PREDNISONE 50 MG PO TABS
60.0000 mg | ORAL_TABLET | Freq: Once | ORAL | Status: AC
  Administered 2017-06-29: 15:00:00 60 mg via ORAL
  Filled 2017-06-29: qty 1

## 2017-06-29 MED ORDER — PREDNISONE 20 MG PO TABS: 60.0000 mg | ORAL_TABLET | Freq: Every day | ORAL | 0 refills | Status: DC

## 2017-06-29 MED FILL — CEPHALEXIN 500 MG CAPSULE: 500 | 5 days supply | Qty: 20 | Fill #0

## 2017-06-29 MED FILL — predniSONE 20 MG TABS: 20 | 4 days supply | Qty: 12 | Fill #0

## 2017-06-29 NOTE — ED Provider Notes (Addendum)
[facility] EMERGENCY DEPARTMENT Provider Note   CSN: [id number] Arrival date & time: 06/29/17  1205     History   Chief Complaint Chief Complaint  Patient presents with  . Shortness of Breath    HPI [facility] Scott is a 62 y.o. female.  Patient w hx copd, c/o sob in the past week, similar to when has flare up of copd, and also notes urine appears cloudy and thinks she may have uti. w copd, uses mdi prn. +non prod cough. No sore throat or runny nose. No fever or chills. No chest pain or discomfort. No leg pain or swelling. Former smoker. Pt indicates self caths at baseline for many years, but in past couple days notes cloudy urine. No abd or flank pain. No dysuria.  The history is provided by the patient.  Flank Pain  Associated symptoms include shortness of breath. Pertinent negatives include no chest pain, no abdominal pain and no headaches.  Shortness of Breath  Associated symptoms include cough. Pertinent negatives include no fever, no headaches, no sore throat, no chest pain, no abdominal pain, no rash and no leg swelling.    Past Medical History:  Diagnosis Date  . Asthma   . Bladder disease   . COPD (chronic obstructive pulmonary disease) ([place])   . Depression   . Hyperlipidemia   . Hypertension   . Peptic ulcer     Patient Active Problem List   Diagnosis Date Noted  . Hypertension, essential, benign 02/19/2017  . COPD (chronic obstructive pulmonary disease) ([place]) 02/11/2017    Past Surgical History:  Procedure Laterality Date  . ABDOMINAL HYSTERECTOMY       OB History   None      Home Medications    Prior to Admission medications   Medication Sig Start Date End Date Taking? Authorizing Provider  albuterol (PROVENTIL) (2.5 MG/3ML) 0.083% nebulizer solution Take 3 mLs (2.5 mg total) 3 (three) times daily as needed by nebulization for wheezing or shortness of breath. 02/11/17   Martinique, Betty G, MD  amLODipine (NORVASC) 5 MG tablet TAKE 1  TABLET BY MOUTH ONCE DAILY. 05/30/17   Martin[place], Betty G, MD  Aspirin-Salicylamide-Caffeine (BC HEADACHE POWDER PO) Take 1 Package by mouth daily as needed (pain).    [provider]  cephALEXin (KEFLEX) 500 MG capsule Take 1 capsule (500 mg total) by mouth 4 (four) times daily. 04/14/17   Petrucelli, Samantha R, PA-C  gabapentin (NEURONTIN) 400 MG capsule Take 300 mg by mouth 2 (two) times daily.     [provider]  ibuprofen (ADVIL,MOTRIN) 600 MG tablet Take 1 tablet (600 mg total) by mouth every 6 (six) hours as needed for moderate pain. 03/14/17   Duffy Bruce, MD  lisinopril (PRINIVIL,ZESTRIL) 20 MG tablet Take 1 tablet (20 mg total) daily by mouth. 02/11/17   Martinique, Betty G, MD  OLANZapine (ZYPREXA) 5 MG tablet Take 5 mg by mouth at bedtime.    [provider]  predniSONE (DELTASONE) 20 MG tablet Take 3 tablets (60 mg total) by mouth daily. 02/27/17   Law, Bea Graff, PA-C  traZODone (DESYREL) 100 MG tablet Take 100 mg by mouth at bedtime.    [provider]    Family History Family History  Problem Relation Age of Onset  . Alcohol abuse Mother   . Arthritis Mother   . Cancer Mother   . Depression Mother   . Early death Mother   . Alcohol abuse Father   .  Depression Father   . Alcohol abuse Sister   . Depression Sister   . Alcohol abuse Brother   . Alcohol abuse Daughter   . Depression Daughter   . Alcohol abuse Son   . COPD Maternal Grandfather   . Heart disease Maternal Grandfather   . Hypertension Maternal Grandfather     Social History Social History   Tobacco Use  . Smoking status: Former Research scientist (life sciences)  . Smokeless tobacco: Never Used  Substance Use Topics  . Alcohol use: No    Comment: Former ETOH  . Drug use: No     Allergies   Patient has no known allergies.   Review of Systems Review of Systems  Constitutional: Negative for fever.  HENT: Negative for sore throat.   Eyes: Negative for redness.  Respiratory: Positive for  cough and shortness of breath.   Cardiovascular: Negative for chest pain and leg swelling.  Gastrointestinal: Negative for abdominal pain.  Genitourinary: Negative for dysuria and flank pain.  Musculoskeletal: Negative for back pain.  Skin: Negative for rash.  Neurological: Negative for headaches.  Hematological: Does not bruise/bleed easily.  Psychiatric/Behavioral: Negative for confusion.     Physical Exam Updated Vital Signs BP 139/88 (BP Location: Left Arm)   Pulse 89   Temp 99 F (37.2 C) (Oral)   Resp 18   Ht 1.499 m (4\' 11" )   Wt 69.9 kg (154 lb 1.6 oz)   SpO2 90%   BMI 31.12 kg/m   Physical Exam  Constitutional: She appears well-developed and well-nourished. No distress.  HENT:  Mouth/Throat: Oropharynx is clear and moist.  Eyes: Conjunctivae are normal. No scleral icterus.  Neck: Neck supple. No tracheal deviation present.  Cardiovascular: Normal rate, regular rhythm, normal heart sounds and intact distal pulses.  Pulmonary/Chest: Effort normal. No respiratory distress.  Decreased bs bil. Mild wheeze.   Abdominal: Soft. Normal appearance and bowel sounds are normal. She exhibits no distension. There is no tenderness.  Genitourinary:  Genitourinary Comments: No cva tenderness  Musculoskeletal: She exhibits no edema.  Neurological: She is alert.  Skin: Skin is warm and dry. No rash noted. She is not diaphoretic.  Psychiatric: She has a normal mood and affect.  Nursing note and vitals reviewed.    ED Treatments / Results  Labs (all labs ordered are listed, but only abnormal results are displayed) Results for orders placed or performed during the hospital encounter of 06/29/17  Urinalysis, Routine w reflex microscopic  Result Value Ref Range   Color, Urine YELLOW YELLOW   APPearance CLEAR CLEAR   Specific Gravity, Urine <1.005 (L) 1.005 - 1.030   pH 6.0 5.0 - 8.0   Glucose, UA NEGATIVE NEGATIVE mg/dL   Hgb urine dipstick NEGATIVE NEGATIVE   Bilirubin  Urine NEGATIVE NEGATIVE   Ketones, ur NEGATIVE NEGATIVE mg/dL   Protein, ur NEGATIVE NEGATIVE mg/dL   Nitrite NEGATIVE NEGATIVE   Leukocytes, UA TRACE (A) NEGATIVE  Urinalysis, Microscopic (reflex)  Result Value Ref Range   RBC / HPF 0-5 0 - 5 RBC/hpf   WBC, UA 6-30 0 - 5 WBC/hpf   Bacteria, UA FEW (A) NONE SEEN   Squamous Epithelial / LPF 0-5 (A) NONE SEEN   Dg Chest 2 View  Result Date: 06/29/2017 CLINICAL DATA:  One week of shortness of breath. History of asthma-COPD. Former smoker. EXAM: CHEST - 2 VIEW COMPARISON:  Chest x-ray of February 27, 2017 FINDINGS: The lungs are adequately inflated. The interstitial markings are coarse. There is no alveolar  infiltrate or pleural effusion. The heart and pulmonary vascularity are normal. The mediastinum is normal in width. There is calcification in the wall of the thoracic aorta. The bony thorax exhibits no acute abnormality. IMPRESSION: Reactive airway disease-COPD. No pneumonia, CHF, nor other acute cardiopulmonary abnormality. Electronically Signed   By: David  Martinique M.D.   On: 06/29/2017 13:07    EKG None  Radiology Dg Chest 2 View  Result Date: 06/29/2017 CLINICAL DATA:  One week of shortness of breath. History of asthma-COPD. Former smoker. EXAM: CHEST - 2 VIEW COMPARISON:  Chest x-ray of February 27, 2017 FINDINGS: The lungs are adequately inflated. The interstitial markings are coarse. There is no alveolar infiltrate or pleural effusion. The heart and pulmonary vascularity are normal. The mediastinum is normal in width. There is calcification in the wall of the thoracic aorta. The bony thorax exhibits no acute abnormality. IMPRESSION: Reactive airway disease-COPD. No pneumonia, CHF, nor other acute cardiopulmonary abnormality. Electronically Signed   By: David  Martinique M.D.   On: 06/29/2017 13:07    Procedures Procedures (including critical care time)  Medications Ordered in ED Medications  albuterol (PROVENTIL) (2.5 MG/3ML) 0.083%  nebulizer solution 5 mg (has no administration in time range)  ipratropium (ATROVENT) nebulizer solution 0.5 mg (has no administration in time range)     Initial Impression / Assessment and Plan / ED Course  I have reviewed the triage vital signs and the nursing notes.  Pertinent labs & imaging results that were available during my care of the patient were reviewed by me and considered in my medical decision making (see chart for details).  Lab/ua sent. Cxr.  Albuterol and atrovent neb.  Reviewed nursing notes and prior charts for additional history.   Recheck, improved air exchange. Persistent wheezing.   Prednisone po. Albuterol and atrovent neb.  Cath ua positive for uti. Keflex po.  Recheck, breathing comfortably. Vitals normal. Pt currently appears stable for d/c.     Final Clinical Impressions(s) / ED Diagnoses   Final diagnoses:  None    ED Discharge Orders    None         Lajean Saver, MD 06/29/17 1440

## 2017-06-29 NOTE — Discharge Instructions (Addendum)
It was our pleasure to provide your ER care today - we hope that you feel better.  Take prednisone as prescribed. Use albuterol inhaler 2 puffs every 4 hours as need.  For urine infection, take keflex (antibiotic) as prescribed.  Follow up with primary care doctor in the coming week for recheck.  Return to ER if worse, increased trouble breathing, other concern.

## 2017-06-29 NOTE — ED Triage Notes (Addendum)
C/o bilat flank pain "kidney infection" x 2-3 days-also c/o SOB x 1week- feels  r/t COPD-NAD-steady gait

## 2017-06-29 NOTE — ED Notes (Signed)
Pt verbalizes understanding of d/c instructions and denies any further needs at this time. 

## 2017-10-01 IMAGING — DX DG CHEST 2V
2 series · 2 of 2 positions shown · non-contrast
Comparison: 07/13/2014

CLINICAL DATA: Chest pain off and on for 6 months, shortness of
breath today, hypertension, asthma, COPD, former smoker

EXAM:
CHEST  2 VIEW

[chest pa]
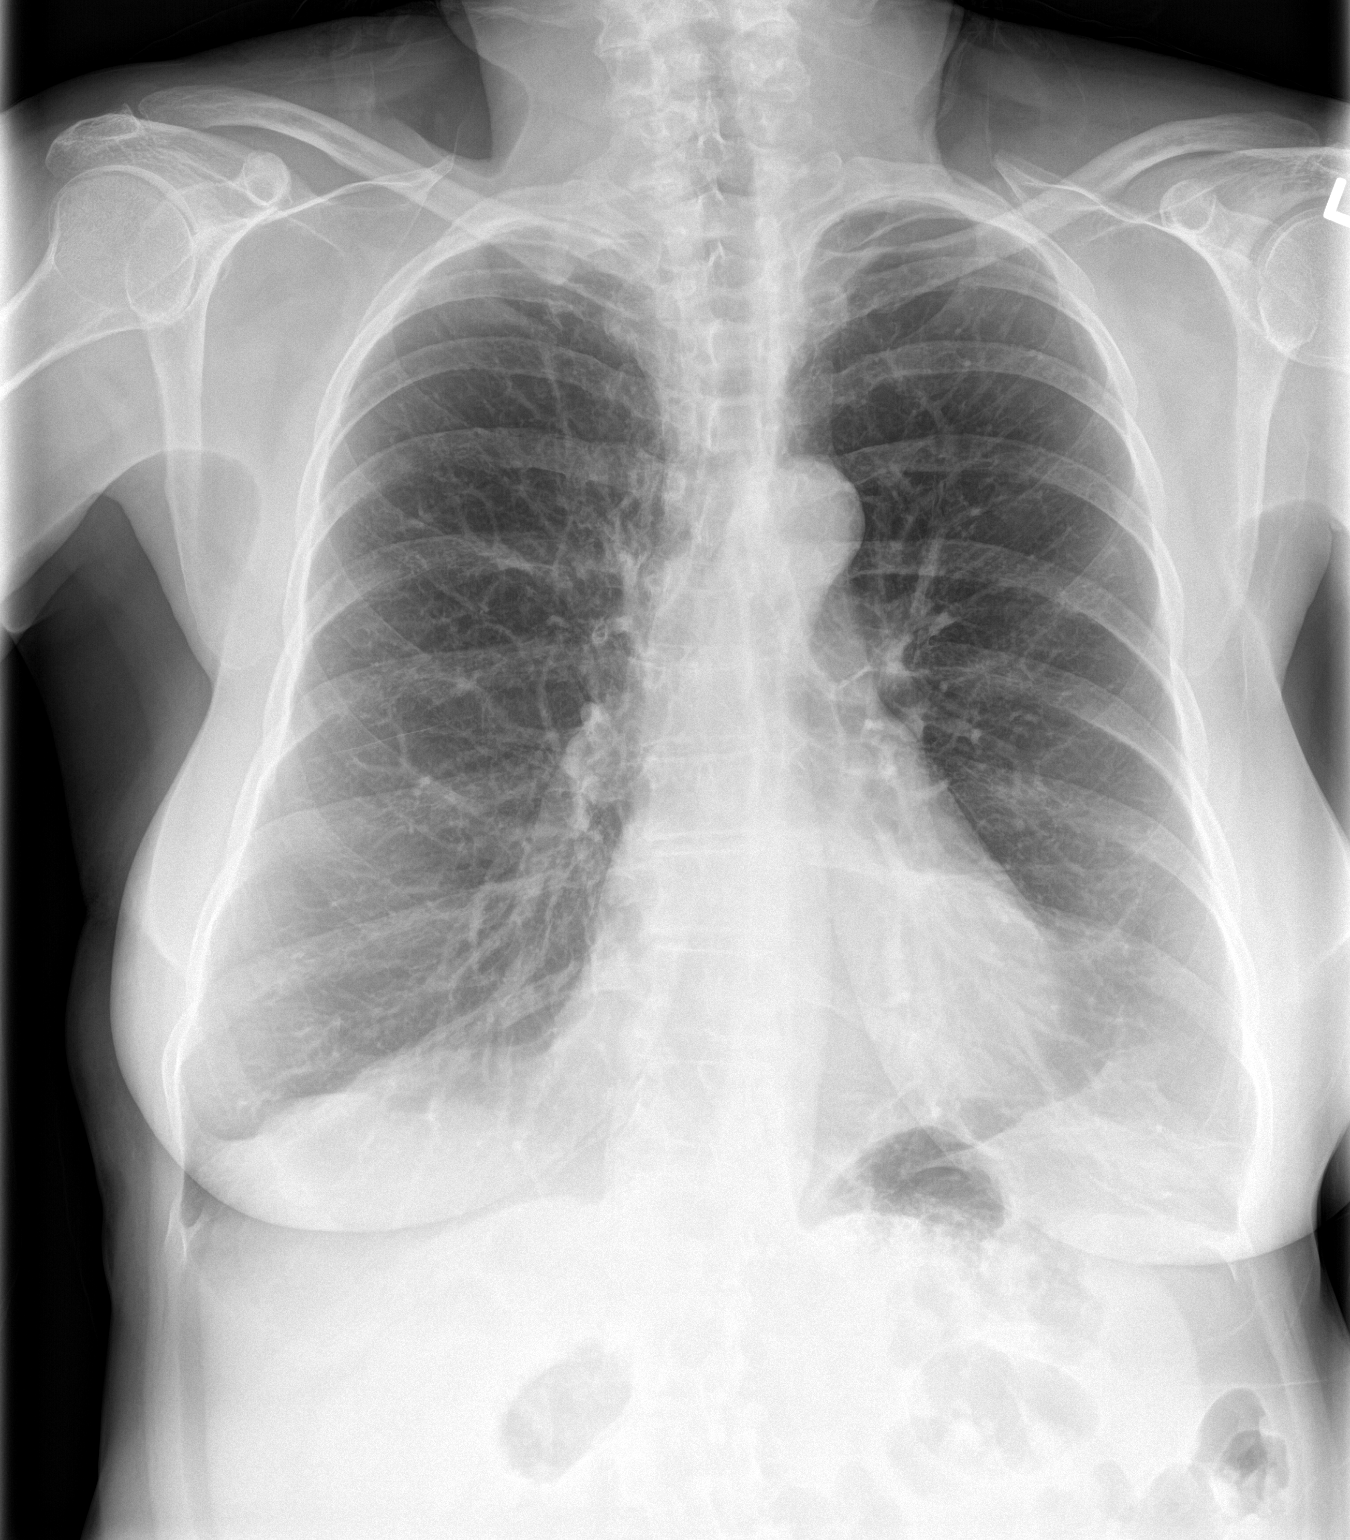

[chest lat]
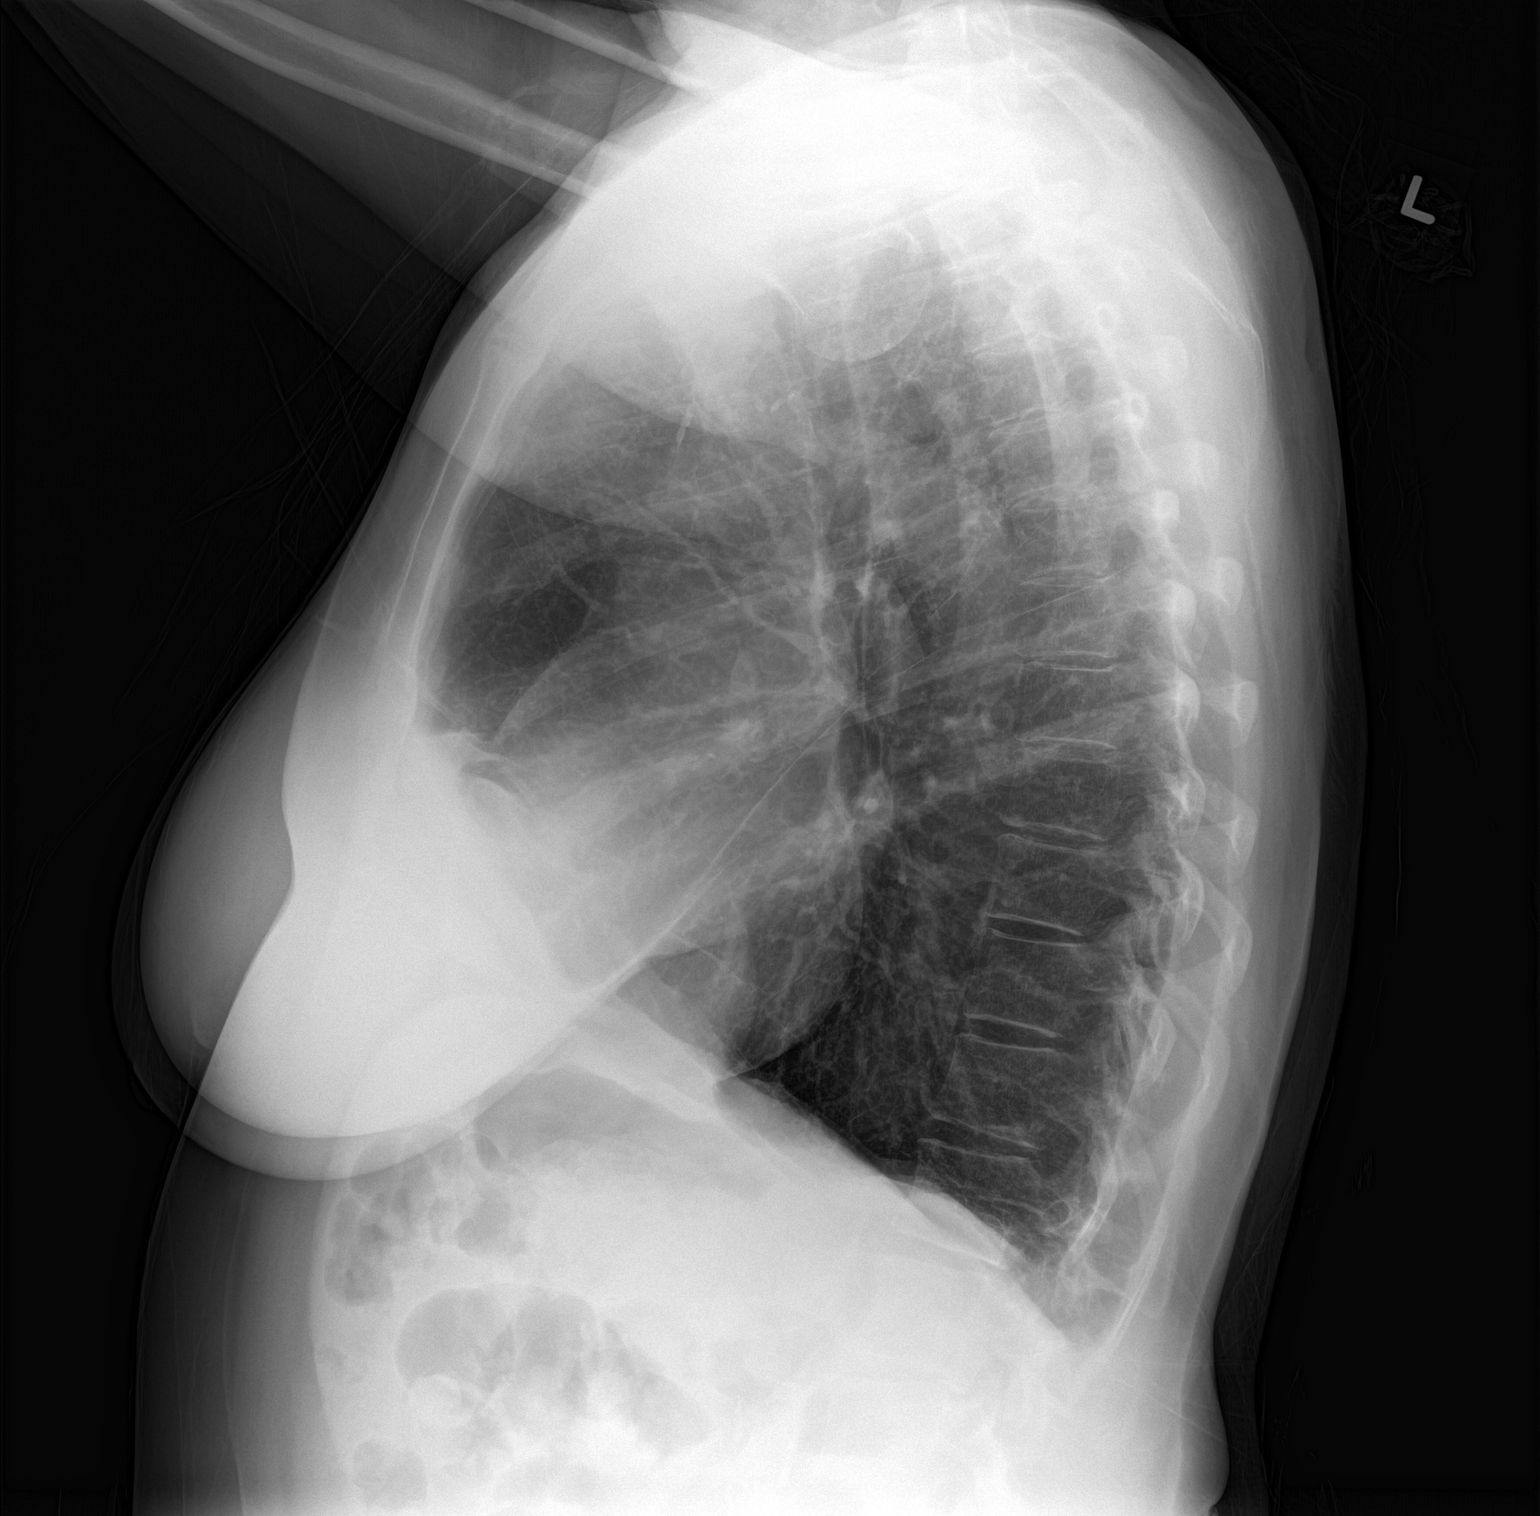

[2 of 2 positions shown; findings below may reference images not displayed]

FINDINGS: Upper normal heart size.

Mediastinal contours and pulmonary vascularity normal.

Emphysematous and minimal bronchitic changes consistent with COPD.

LEFT basilar scarring stable.

No acute infiltrate, pleural effusion, or pneumothorax.

Bones unremarkable.
IMPRESSION: COPD changes with LEFT basilar scarring.

No acute abnormalities.

## 2017-10-17 ENCOUNTER — Emergency Department (HOSPITAL_BASED_OUTPATIENT_CLINIC_OR_DEPARTMENT_OTHER): Payer: Self-pay

## 2017-10-17 ENCOUNTER — Observation Stay (HOSPITAL_BASED_OUTPATIENT_CLINIC_OR_DEPARTMENT_OTHER)
Admission: EM | Admit: 2017-10-17 | Discharge: 2017-10-18 | Disposition: A | Discharge status: Home / Self Care | Payer: Self-pay | Attending: Internal Medicine | Admitting: Internal Medicine

## 2017-10-17 ENCOUNTER — Encounter (HOSPITAL_BASED_OUTPATIENT_CLINIC_OR_DEPARTMENT_OTHER): Payer: Self-pay | Admitting: Emergency Medicine

## 2017-10-17 ENCOUNTER — Other Ambulatory Visit: Payer: Self-pay

## 2017-10-17 DIAGNOSIS — Z8711 Personal history of peptic ulcer disease: Secondary | ICD-10-CM | POA: Insufficient documentation

## 2017-10-17 DIAGNOSIS — J45909 Unspecified asthma, uncomplicated: Secondary | ICD-10-CM | POA: Insufficient documentation

## 2017-10-17 DIAGNOSIS — N329 Bladder disorder, unspecified: Secondary | ICD-10-CM

## 2017-10-17 DIAGNOSIS — E785 Hyperlipidemia, unspecified: Secondary | ICD-10-CM

## 2017-10-17 DIAGNOSIS — T486X6A Underdosing of antiasthmatics, initial encounter: Secondary | ICD-10-CM | POA: Insufficient documentation

## 2017-10-17 DIAGNOSIS — R55 Syncope and collapse: Secondary | ICD-10-CM

## 2017-10-17 DIAGNOSIS — Z87891 Personal history of nicotine dependence: Secondary | ICD-10-CM | POA: Insufficient documentation

## 2017-10-17 DIAGNOSIS — F32A Depression, unspecified: Secondary | ICD-10-CM

## 2017-10-17 DIAGNOSIS — Z91138 Patient's unintentional underdosing of medication regimen for other reason: Secondary | ICD-10-CM | POA: Insufficient documentation

## 2017-10-17 DIAGNOSIS — J9601 Acute respiratory failure with hypoxia: Secondary | ICD-10-CM

## 2017-10-17 DIAGNOSIS — E876 Hypokalemia: Secondary | ICD-10-CM

## 2017-10-17 DIAGNOSIS — J441 Chronic obstructive pulmonary disease with (acute) exacerbation: Principal | ICD-10-CM | POA: Insufficient documentation

## 2017-10-17 DIAGNOSIS — K219 Gastro-esophageal reflux disease without esophagitis: Secondary | ICD-10-CM

## 2017-10-17 DIAGNOSIS — I10 Essential (primary) hypertension: Secondary | ICD-10-CM | POA: Insufficient documentation

## 2017-10-17 DIAGNOSIS — R0602 Shortness of breath: Secondary | ICD-10-CM

## 2017-10-17 DIAGNOSIS — R739 Hyperglycemia, unspecified: Secondary | ICD-10-CM | POA: Insufficient documentation

## 2017-10-17 DIAGNOSIS — J449 Chronic obstructive pulmonary disease, unspecified: Secondary | ICD-10-CM | POA: Diagnosis present

## 2017-10-17 DIAGNOSIS — F329 Major depressive disorder, single episode, unspecified: Secondary | ICD-10-CM | POA: Insufficient documentation

## 2017-10-17 DIAGNOSIS — Y92009 Unspecified place in unspecified non-institutional (private) residence as the place of occurrence of the external cause: Secondary | ICD-10-CM | POA: Insufficient documentation

## 2017-10-17 LAB — RESPIRATORY PANEL BY PCR

## 2017-10-17 LAB — CBC
HCT: 40.2 % (ref 36.0–46.0)
Hemoglobin: 13 g/dL (ref 12.0–15.0)
MCH: 28.5 pg (ref 26.0–34.0)
MCHC: 32.3 g/dL (ref 30.0–36.0)
MCV: 88.2 fL (ref 78.0–100.0)
Platelets: 381 10*3/uL (ref 150–400)
RBC: 4.56 MIL/uL (ref 3.87–5.11)
RDW: 15.2 % (ref 11.5–15.5)
WBC: 7.3 10*3/uL (ref 4.0–10.5)

## 2017-10-17 LAB — I-STAT CHEM 8, ED
BUN: 13 mg/dL (ref 8–23)
Calcium, Ion: 1.2 mmol/L (ref 1.15–1.40)
Chloride: 101 mmol/L (ref 98–111)
Creatinine, Ser: 0.7 mg/dL (ref 0.44–1.00)
Glucose, Bld: 159 mg/dL — ABNORMAL HIGH (ref 70–99)
HCT: 39 % (ref 36.0–46.0)
Hemoglobin: 13.3 g/dL (ref 12.0–15.0)
Potassium: 3.4 mmol/L — ABNORMAL LOW (ref 3.5–5.1)
Sodium: 140 mmol/L (ref 135–145)
TCO2: 27 mmol/L (ref 22–32)

## 2017-10-17 LAB — CREATININE, SERUM
Creatinine, Ser: 0.65 mg/dL (ref 0.44–1.00)
GFR calc Af Amer: >60 mL/min (ref >60)
GFR calc non Af Amer: >60 mL/min (ref >60)

## 2017-10-17 LAB — CBC WITH DIFFERENTIAL/PLATELET
Basophils Absolute: 0.1 10*3/uL (ref 0.0–0.1)
Basophils Relative: 1 %
Eosinophils Absolute: 0.6 10*3/uL (ref 0.0–0.7)
Eosinophils Relative: 10 %
HCT: 39.4 % (ref 36.0–46.0)
Hemoglobin: 12.7 g/dL (ref 12.0–15.0)
Lymphocytes Relative: 32 %
Lymphs Abs: 2 10*3/uL (ref 0.7–4.0)
MCH: 28.2 pg (ref 26.0–34.0)
MCHC: 32.2 g/dL (ref 30.0–36.0)
MCV: 87.6 fL (ref 78.0–100.0)
Monocytes Absolute: 0.4 10*3/uL (ref 0.1–1.0)
Monocytes Relative: 6 %
Neutro Abs: 3.1 10*3/uL (ref 1.7–7.7)
Neutrophils Relative %: 50 %
Platelets: 330 10*3/uL (ref 150–400)
RBC: 4.5 MIL/uL (ref 3.87–5.11)
RDW: 15.2 % (ref 11.5–15.5)
WBC: 6.1 10*3/uL (ref 4.0–10.5)

## 2017-10-17 LAB — PHOSPHORUS: Phosphorus: 3 mg/dL (ref 2.5–4.6)

## 2017-10-17 LAB — MAGNESIUM: Magnesium: 1.9 mg/dL (ref 1.7–2.4)

## 2017-10-17 MED ORDER — ALBUTEROL SULFATE (2.5 MG/3ML) 0.083% IN NEBU: 2.5000 mg | INHALATION_SOLUTION | RESPIRATORY_TRACT | Status: DC | PRN

## 2017-10-17 MED ORDER — LISINOPRIL 20 MG PO TABS
20.0000 mg | ORAL_TABLET | Freq: Every day | ORAL | Status: DC
  Administered 2017-10-17 – 2017-10-18 (×2): 20 mg via ORAL
  Filled 2017-10-17 (×2): qty 1

## 2017-10-17 MED ORDER — PREDNISONE 20 MG PO TABS
40.0000 mg | ORAL_TABLET | Freq: Once | ORAL | Status: AC
  Administered 2017-10-17: 40 mg via ORAL
  Filled 2017-10-17: qty 2

## 2017-10-17 MED ORDER — GABAPENTIN 300 MG PO CAPS
300.0000 mg | ORAL_CAPSULE | Freq: Two times a day (BID) | ORAL | Status: DC
  Administered 2017-10-17 – 2017-10-18 (×3): 300 mg via ORAL
  Filled 2017-10-17 (×3): qty 1

## 2017-10-17 MED ORDER — ALBUTEROL SULFATE (2.5 MG/3ML) 0.083% IN NEBU
2.5000 mg | INHALATION_SOLUTION | Freq: Once | RESPIRATORY_TRACT | Status: AC
  Administered 2017-10-17: 2.5 mg via RESPIRATORY_TRACT
  Filled 2017-10-17: qty 3

## 2017-10-17 MED ORDER — TRAZODONE HCL 100 MG PO TABS
100.0000 mg | ORAL_TABLET | Freq: Every day | ORAL | Status: DC
  Administered 2017-10-17: 100 mg via ORAL
  Filled 2017-10-17: qty 1

## 2017-10-17 MED ORDER — AMLODIPINE BESYLATE 5 MG PO TABS
5.0000 mg | ORAL_TABLET | Freq: Every day | ORAL | Status: DC
  Administered 2017-10-17 – 2017-10-18 (×2): 5 mg via ORAL
  Filled 2017-10-17 (×2): qty 1

## 2017-10-17 MED ORDER — GUAIFENESIN ER 600 MG PO TB12
1200.0000 mg | ORAL_TABLET | Freq: Two times a day (BID) | ORAL | Status: DC
  Administered 2017-10-17 – 2017-10-18 (×3): 1200 mg via ORAL
  Filled 2017-10-17 (×3): qty 2

## 2017-10-17 MED ORDER — IPRATROPIUM-ALBUTEROL 0.5-2.5 (3) MG/3ML IN SOLN
3.0000 mL | Freq: Once | RESPIRATORY_TRACT | Status: AC
  Administered 2017-10-17: 3 mL via RESPIRATORY_TRACT
  Filled 2017-10-17: qty 3

## 2017-10-17 MED ORDER — IPRATROPIUM BROMIDE 0.02 % IN SOLN
0.5000 mg | Freq: Three times a day (TID) | RESPIRATORY_TRACT | Status: DC
  Administered 2017-10-17 – 2017-10-18 (×2): 0.5 mg via RESPIRATORY_TRACT
  Filled 2017-10-17 (×3): qty 2.5

## 2017-10-17 MED ORDER — SODIUM CHLORIDE 0.9 % IV SOLN
500.0000 mg | INTRAVENOUS | Status: DC
  Administered 2017-10-17: 500 mg via INTRAVENOUS
  Filled 2017-10-17 (×2): qty 500

## 2017-10-17 MED ORDER — PROMETHAZINE HCL 25 MG PO TABS: 12.5000 mg | ORAL_TABLET | Freq: Four times a day (QID) | ORAL | Status: DC | PRN

## 2017-10-17 MED ORDER — METHYLPREDNISOLONE SODIUM SUCC 40 MG IJ SOLR
40.0000 mg | Freq: Three times a day (TID) | INTRAMUSCULAR | Status: DC
  Administered 2017-10-17 – 2017-10-18 (×3): 40 mg via INTRAVENOUS
  Filled 2017-10-17 (×3): qty 1

## 2017-10-17 MED ORDER — LEVALBUTEROL HCL 0.63 MG/3ML IN NEBU
0.6300 mg | INHALATION_SOLUTION | Freq: Three times a day (TID) | RESPIRATORY_TRACT | Status: DC
  Administered 2017-10-17 – 2017-10-18 (×2): 0.63 mg via RESPIRATORY_TRACT
  Filled 2017-10-17 (×3): qty 3

## 2017-10-17 MED ORDER — POTASSIUM CHLORIDE CRYS ER 20 MEQ PO TBCR
40.0000 meq | EXTENDED_RELEASE_TABLET | Freq: Two times a day (BID) | ORAL | Status: AC
  Administered 2017-10-17 (×2): 40 meq via ORAL
  Filled 2017-10-17 (×2): qty 2

## 2017-10-17 MED ORDER — SODIUM CHLORIDE 0.9 % IV SOLN
INTRAVENOUS | Status: AC
  Administered 2017-10-17: 14:00:00 via INTRAVENOUS

## 2017-10-17 MED ORDER — IPRATROPIUM BROMIDE 0.02 % IN SOLN
0.5000 mg | Freq: Four times a day (QID) | RESPIRATORY_TRACT | Status: DC
  Administered 2017-10-17: 0.5 mg via RESPIRATORY_TRACT
  Filled 2017-10-17: qty 2.5

## 2017-10-17 MED ORDER — HEPARIN SODIUM (PORCINE) 5000 UNIT/ML IJ SOLN
5000.0000 [IU] | Freq: Three times a day (TID) | INTRAMUSCULAR | Status: DC
  Administered 2017-10-17: 5000 [IU] via SUBCUTANEOUS
  Filled 2017-10-17: qty 1

## 2017-10-17 MED ORDER — DOCUSATE SODIUM 100 MG PO CAPS
100.0000 mg | ORAL_CAPSULE | Freq: Two times a day (BID) | ORAL | Status: DC
  Administered 2017-10-17 – 2017-10-18 (×2): 100 mg via ORAL
  Filled 2017-10-17 (×2): qty 1

## 2017-10-17 MED ORDER — LEVALBUTEROL HCL 0.63 MG/3ML IN NEBU
0.6300 mg | INHALATION_SOLUTION | Freq: Four times a day (QID) | RESPIRATORY_TRACT | Status: DC
  Administered 2017-10-17: 0.63 mg via RESPIRATORY_TRACT
  Filled 2017-10-17: qty 3

## 2017-10-17 MED ORDER — PANTOPRAZOLE SODIUM 40 MG PO TBEC
40.0000 mg | DELAYED_RELEASE_TABLET | Freq: Every day | ORAL | Status: DC
  Administered 2017-10-17 – 2017-10-18 (×2): 40 mg via ORAL
  Filled 2017-10-17 (×2): qty 1

## 2017-10-17 MED ORDER — PNEUMOCOCCAL VAC POLYVALENT 25 MCG/0.5ML IJ INJ
0.5000 mL | INJECTION | INTRAMUSCULAR | Status: DC
  Filled 2017-10-17: qty 0.5

## 2017-10-17 MED ORDER — ALBUTEROL SULFATE (2.5 MG/3ML) 0.083% IN NEBU
5.0000 mg | INHALATION_SOLUTION | Freq: Once | RESPIRATORY_TRACT | Status: AC
Start: 2017-10-17 — End: 2017-10-17
  Administered 2017-10-17: 5 mg via RESPIRATORY_TRACT
  Filled 2017-10-17: qty 6

## 2017-10-17 MED ORDER — ACETAMINOPHEN 325 MG PO TABS: 650.0000 mg | ORAL_TABLET | Freq: Four times a day (QID) | ORAL | Status: DC | PRN

## 2017-10-17 MED ORDER — ACETAMINOPHEN 650 MG RE SUPP: 650.0000 mg | Freq: Four times a day (QID) | RECTAL | Status: DC | PRN

## 2017-10-17 MED ORDER — OLANZAPINE 5 MG PO TABS
5.0000 mg | ORAL_TABLET | Freq: Every day | ORAL | Status: DC
  Administered 2017-10-17: 5 mg via ORAL
  Filled 2017-10-17: qty 1

## 2017-10-17 NOTE — ED Notes (Addendum)
Ambulated on r/a HR 95-100, SpO2 90% at start dropped to 81%. Mild DOE noted.  Placed on 1l/m [place] back in room SpO2 now 96%

## 2017-10-17 NOTE — ED Provider Notes (Signed)
[facility] EMERGENCY DEPARTMENT Provider Note   CSN: [id number] Arrival date & time: 10/17/17  4098     History   Chief Complaint Chief Complaint  Patient presents with  . Shortness of Breath    HPI [facility] Scott is a 62 y.o. female.  The history is provided by the patient. No language interpreter was used.  Shortness of Breath    [facility] Scott is a 62 y.o. female who presents to the Emergency Department complaining of sob. She presents to the emergency department complaining of shortness of breath. She has chronic shortness of breath related to COPD. She has been self-medicating with her brothers nebulizer treatments at home. Last night she used up her last nebulizer treatment. Today she reports acute on chronic worsening of her shortness of breath. She does have significant dyspnea on exertion and felt like she was going to pass out while ambulating yesterday. She denies any fevers, cough, chest pain, leg swelling or pain. She has no family doctor and has been treating herself with her brothers extra medications. Past Medical History:  Diagnosis Date  . Asthma   . Bladder disease   . COPD (chronic obstructive pulmonary disease) ([place])   . Depression   . Hyperlipidemia   . Hypertension   . Peptic ulcer     Patient Active Problem List   Diagnosis Date Noted  . Acute respiratory failure with hypoxia ([place]) 10/17/2017  . Hypokalemia 10/17/2017  . Near syncope 10/17/2017  . Hyperlipidemia 10/17/2017  . GERD (gastroesophageal reflux disease) 10/17/2017  . Bladder disease or syndrome 10/17/2017  . Depression 10/17/2017  . Hypertension, essential, benign 02/19/2017  . COPD (chronic obstructive pulmonary disease) ([place]) 02/11/2017    Past Surgical History:  Procedure Laterality Date  . ABDOMINAL HYSTERECTOMY       OB History   None      Home Medications    Prior to Admission medications   Medication Sig Start Date End Date Taking? Authorizing  Provider  albuterol (PROVENTIL HFA;VENTOLIN HFA) 108 (90 Base) MCG/ACT inhaler Inhale 2 puffs into the lungs every 4 (four) hours as needed for wheezing or shortness of breath. 06/29/17  Yes Lajean Saver, MD  albuterol (PROVENTIL) (2.5 MG/3ML) 0.083% nebulizer solution Take 3 mLs (2.5 mg total) 3 (three) times daily as needed by nebulization for wheezing or shortness of breath. 02/11/17  Yes Martinique, Betty G, MD  amLODipine (NORVASC) 5 MG tablet TAKE 1 TABLET BY MOUTH ONCE DAILY. 05/30/17  Yes [place], Betty G, MD  Aspirin-Salicylamide-Caffeine [facility] HEADACHE POWDER PO) Take 1 Package by mouth daily as needed (pain).   Yes [provider]  Fluoxetine HCl, PMDD, 20 MG TABS Take 20 mg by mouth daily. 12/21/14  Yes [provider]  gabapentin (NEURONTIN) 400 MG capsule Take 400 mg by mouth 3 (three) times daily.    Yes [provider]  ibuprofen (ADVIL,MOTRIN) 600 MG tablet Take 1 tablet (600 mg total) by mouth every 6 (six) hours as needed for moderate pain. 03/14/17  Yes Duffy Bruce, MD  lisinopril (PRINIVIL,ZESTRIL) 20 MG tablet Take 1 tablet (20 mg total) daily by mouth. 02/11/17  Yes [place], Betty G, MD  OLANZapine (ZYPREXA) 5 MG tablet Take 5 mg by mouth at bedtime.   Yes [provider]  traZODone (DESYREL) 100 MG tablet Take 100 mg by mouth at bedtime as needed for sleep.    Yes [provider]    Family History Family History  Problem Relation Age of  Onset  . Alcohol abuse Mother   . Arthritis Mother   . Cancer Mother   . Depression Mother   . Early death Mother   . Alcohol abuse Father   . Depression Father   . Alcohol abuse Sister   . Depression Sister   . Alcohol abuse Brother   . Alcohol abuse Daughter   . Depression Daughter   . Alcohol abuse Son   . COPD Maternal Grandfather   . Heart disease Maternal Grandfather   . Hypertension Maternal Grandfather     Social History Social History   Tobacco Use  . Smoking status: Former  Research scientist (life sciences)  . Smokeless tobacco: Never Used  Substance Use Topics  . Alcohol use: No    Comment: Former ETOH  . Drug use: No     Allergies   Patient has no known allergies.   Review of Systems Review of Systems  Respiratory: Positive for shortness of breath.   All other systems reviewed and are negative.    Physical Exam Updated Vital Signs BP (!) 147/90 (BP Location: Left Arm)   Pulse 77   Temp 98 F (36.7 C) (Oral)   Resp 16   Ht 4\' 11"  (1.499 m)   Wt 70.3 kg (155 lb)   SpO2 93%   BMI 31.31 kg/m   Physical Exam  Constitutional: She is oriented to person, place, and time. She appears well-developed and well-nourished.  HENT:  Head: Normocephalic and atraumatic.  Cardiovascular: Normal rate and regular rhythm.  No murmur heard. Pulmonary/Chest: Effort normal. No respiratory distress.  Wheezes and rhonchi bilaterally  Abdominal: Soft. There is no tenderness. There is no rebound and no guarding.  Musculoskeletal: She exhibits no edema or tenderness.  Neurological: She is alert and oriented to person, place, and time.  Skin: Skin is warm and dry.  Psychiatric: She has a normal mood and affect. Her behavior is normal.  Nursing note and vitals reviewed.    ED Treatments / Results  Labs (all labs ordered are listed, but only abnormal results are displayed) Labs Reviewed  I-STAT CHEM 8, ED - Abnormal; Notable for the following components:      Result Value   Potassium 3.4 (*)    Glucose, Bld 159 (*)    All other components within normal limits  RESPIRATORY PANEL BY PCR  CULTURE, EXPECTORATED SPUTUM-ASSESSMENT  CBC WITH DIFFERENTIAL/PLATELET  CBC  CREATININE, SERUM  MAGNESIUM  PHOSPHORUS  BASIC METABOLIC PANEL  HIV ANTIBODY (ROUTINE TESTING)    EKG EKG Interpretation  Date/Time:  Monday October 17 2017 08:24:03 EDT Ventricular Rate:  74 PR Interval:    QRS Duration: 86 QT Interval:  389 QTC Calculation: 432 R Axis:   86 Text Interpretation:  Sinus  rhythm Borderline right axis deviation Baseline wander in lead(s) II III aVF V6 Confirmed by Quintella Reichert [phone]) on 10/17/2017 8:34:20 AM   Radiology Dg Chest 2 View  Result Date: 10/17/2017 CLINICAL DATA:  Shortness of Breath EXAM: CHEST - 2 VIEW COMPARISON:  06/29/2017 FINDINGS: Cardiac shadow is stable. The lungs are hyper aerated bilaterally. No focal infiltrate or sizable effusion is seen. No acute bony abnormality is noted. IMPRESSION: Mild changes of COPD without acute abnormality. Electronically Signed   By: Inez Catalina M.D.   On: 10/17/2017 08:46    Procedures Procedures (including critical care time)  Medications Ordered in ED Medications  pneumococcal 23 valent vaccine (PNU-IMMUNE) injection 0.5 mL (has no administration in time range)  amLODipine (NORVASC) tablet 5  mg (5 mg Oral Given 10/17/17 1411)  gabapentin (NEURONTIN) capsule 300 mg (300 mg Oral Given 10/17/17 1411)  lisinopril (PRINIVIL,ZESTRIL) tablet 20 mg (20 mg Oral Given 10/17/17 1418)  OLANZapine (ZYPREXA) tablet 5 mg (has no administration in time range)  traZODone (DESYREL) tablet 100 mg (has no administration in time range)  heparin injection 5,000 Units (has no administration in time range)  0.9 %  sodium chloride infusion ( Intravenous New Bag/Given 10/17/17 1410)  acetaminophen (TYLENOL) tablet 650 mg (has no administration in time range)    Or  acetaminophen (TYLENOL) suppository 650 mg (has no administration in time range)  docusate sodium (COLACE) capsule 100 mg (has no administration in time range)  promethazine (PHENERGAN) tablet 12.5 mg (has no administration in time range)  methylPREDNISolone sodium succinate (SOLU-MEDROL) 40 mg/mL injection 40 mg (40 mg Intravenous Given 10/17/17 1411)  azithromycin (ZITHROMAX) 500 mg in sodium chloride 0.9 % 250 mL IVPB (500 mg Intravenous New Bag/Given 10/17/17 1410)  guaiFENesin (MUCINEX) 12 hr tablet 1,200 mg (1,200 mg Oral Given 10/17/17 1411)  pantoprazole  (PROTONIX) EC tablet 40 mg (40 mg Oral Given 10/17/17 1411)  albuterol (PROVENTIL) (2.5 MG/3ML) 0.083% nebulizer solution 2.5 mg (has no administration in time range)  potassium chloride SA (K-DUR,KLOR-CON) CR tablet 40 mEq (40 mEq Oral Given 10/17/17 1411)  ipratropium (ATROVENT) nebulizer solution 0.5 mg (has no administration in time range)  levalbuterol (XOPENEX) nebulizer solution 0.63 mg (has no administration in time range)  albuterol (PROVENTIL) (2.5 MG/3ML) 0.083% nebulizer solution 2.5 mg (2.5 mg Nebulization Given 10/17/17 0738)  ipratropium-albuterol (DUONEB) 0.5-2.5 (3) MG/3ML nebulizer solution 3 mL (3 mLs Nebulization Given 10/17/17 0738)  albuterol (PROVENTIL) (2.5 MG/3ML) 0.083% nebulizer solution 2.5 mg (2.5 mg Nebulization Given 10/17/17 0803)  ipratropium-albuterol (DUONEB) 0.5-2.5 (3) MG/3ML nebulizer solution 3 mL (3 mLs Nebulization Given 10/17/17 0803)  predniSONE (DELTASONE) tablet 40 mg (40 mg Oral Given 10/17/17 0814)  albuterol (PROVENTIL) (2.5 MG/3ML) 0.083% nebulizer solution 5 mg (5 mg Nebulization Given 10/17/17 0951)     Initial Impression / Assessment and Plan / ED Course  I have reviewed the triage vital signs and the nursing notes.  Pertinent labs & imaging results that were available during my care of the patient were reviewed by me and considered in my medical decision making (see chart for details).     Patient with history of COPD here for evaluation of increased shortness of breath. She has wheezing on examination that is improved following multiple albuterol treatments. No evidence of acute pneumonia or CHF. She does have a new oxygen requirement with hypoxia to 80% with ambulation, 88% with rest. Plan to admit for COPD exacerbation with hypoxemia. Patient is in agreement with treatment plan. Current presentation is not consistent with PE. Hospitalist consulted for admission.  Final Clinical Impressions(s) / ED Diagnoses   Final diagnoses:  COPD  exacerbation [facility])    ED Discharge Orders    None       Quintella Reichert, MD 10/17/17 [phone]

## 2017-10-17 NOTE — Progress Notes (Signed)
Called by Dr. Marin Roberts about patient at [facility] for transfer.  Patient is a 62 year old female with a past medical history significant for COPD, bladder disease, depression, hyperlipidemia, hypertension, GERD and other comorbidities who does not see a physician and has been self-medicating for her COPD with her brothers albuterol.  She ran out of her albuterol has been feeling more short of breath.  She also has a new oxygen requirement 2 L and acute respiratory failure with hypoxia.  Hemodynamically stable blood pressures controlled but will be accepted and  admitted to [facility] for an acute COPD exacerbation as an inpatient.  Will check respiratory virus panel

## 2017-10-17 NOTE — ED Triage Notes (Signed)
Pt states she is out of her albuterol and started to feel more sob this am over her baseline.  Pt has copd.  No cough, no fever.

## 2017-10-17 NOTE — H&P (Signed)
History and Physical    Monica Scott XBD:[id number] DOB: 1955-07-21 DOA: 10/17/2017  PCP: Patient, No Pcp Per   Patient coming from: Home  Chief Complaint: Shortness of Breath  HPI: Monica Scott is a 62 y.o. female with medical history significant of tension, hyperlipidemia, peptic ulcer disease, COPD and asthma, history of bladder disease where she self caths, and other comorbidities who states that she presents for worsening shortness of breath.  Patient has had SOB during the last year but progressively worse in the last month.  She has been using her and inhaler but ran out and currently using her dead brother's albuterol Neb but ran out of that as well and ran out last week.  For the last last couple days has had progressively worsening and severe shortness of breath with associated wheezing.  She has not had very much sputum production or coughing but does admit to having associated Symptoms of dizziness, lightheadedness where she was close to fainting or shortness of breath. No CP. Not had a cough. No Sputum production.  She has a new O2 Requirement. No N/V/D. Self Caths. No weakness.  Denies any other complaints or concerns.  Had x-ray done admission which showed mild changes of COPD.  [facility] was consulted to admit this patient for a COPD exacerbation and acute respiratory failure with hypoxia.  ED Course: Recieved 3 breathing treatments (2.5 mg Neb of Albuterol, 5 mg Neb of Albuterol, and a DuoNeb) and po Prednisone; Had basic blood work done along with a 2 view CXR.   Review of Systems: As per HPI otherwise 10 point review of systems negative.   Past Medical History:  Diagnosis Date  . Asthma   . Bladder disease   . COPD (chronic obstructive pulmonary disease) ([place])   . Depression   . Hyperlipidemia   . Hypertension   . Peptic ulcer    Past Surgical History:  Procedure Laterality Date  . ABDOMINAL HYSTERECTOMY     SOCIAL HISTORY   reports that she has quit smoking. She  has never used smokeless tobacco. She reports that she does not drink alcohol or use drugs.  ALLEGIES No Known Allergies  Family History  Problem Relation Age of Onset  . Alcohol abuse Mother   . Arthritis Mother   . Cancer Mother   . Depression Mother   . Early death Mother   . Alcohol abuse Father   . Depression Father   . Alcohol abuse Sister   . Depression Sister   . Alcohol abuse Brother   . Alcohol abuse Daughter   . Depression Daughter   . Alcohol abuse Son   . COPD Maternal Grandfather   . Heart disease Maternal Grandfather   . Hypertension Maternal Grandfather    Prior to Admission medications   Medication Sig Start Date End Date Taking? Authorizing Provider  albuterol (PROVENTIL HFA;VENTOLIN HFA) 108 (90 Base) MCG/ACT inhaler Inhale 2 puffs into the lungs every 4 (four) hours as needed for wheezing or shortness of breath. 06/29/17   Lajean Saver, MD  albuterol (PROVENTIL) (2.5 MG/3ML) 0.083% nebulizer solution Take 3 mLs (2.5 mg total) 3 (three) times daily as needed by nebulization for wheezing or shortness of breath. 02/11/17   [place], Betty G, MD  amLODipine (NORVASC) 5 MG tablet TAKE 1 TABLET BY MOUTH ONCE DAILY. 05/30/17   [place], Betty G, MD  Aspirin-Salicylamide-Caffeine (BC HEADACHE POWDER PO) Take 1 Package by mouth daily as needed (pain).    [provider]  gabapentin (NEURONTIN) 400 MG capsule Take 300 mg by mouth 2 (two) times daily.     [provider]  ibuprofen (ADVIL,MOTRIN) 600 MG tablet Take 1 tablet (600 mg total) by mouth every 6 (six) hours as needed for moderate pain. 03/14/17   Duffy Bruce, MD  lisinopril (PRINIVIL,ZESTRIL) 20 MG tablet Take 1 tablet (20 mg total) daily by mouth. 02/11/17   [place], Betty G, MD  OLANZapine (ZYPREXA) 5 MG tablet Take 5 mg by mouth at bedtime.    [provider]  traZODone (DESYREL) 100 MG tablet Take 100 mg by mouth at bedtime.    [provider]   Physical Exam: Vitals:    10/17/17 1008 10/17/17 1120 10/17/17 1130 10/17/17 1227  BP: 140/80 (!) 144/85 135/78 (!) 147/90  Pulse: 73 77 71 77  Resp: 16 (!) 22 16   Temp:    98 F (36.7 C)  TempSrc:    Oral  SpO2: 95% 91% 93% 95%  Weight:      Height:       Constitutional: WN/WD obese Caucasian female in NAD and appears calm and comfortable Eyes: Lids and conjunctivae normal, sclerae anicteric  ENMT: External Ears, Nose appear normal. Grossly normal hearing. Mucous membranes are moist.  Neck: Appears normal, supple, no cervical masses, normal ROM, no appreciable thyromegaly, no JVD Respiratory: Diminishedto auscultation bilaterally with coarse breath sounds but no wheezing, rales, rhonchi or crackles noted. Normal respiratory effort and patient is not tachypenic. No accessory muscle use. Wearing Supplemental O2 via [place] Cardiovascular: RRR, no murmurs / rubs / gallops. S1 and S2 auscultated. No extremity edema.  Abdomen: Soft, non-tender, Distended slightly. No masses palpated. No appreciable hepatosplenomegaly. Bowel sounds positive x4.  GU: Deferred. Musculoskeletal: No clubbing / cyanosis of digits/nails. Normal strength and muscle tone.  Skin: No rashes, lesions, ulcers on a limited skin evaluation. No induration; Warm and dry.  Neurologic: CN 2-12 grossly intact with no focal deficits. Romberg sign and cerebellar reflexes not assessed.  Psychiatric: Normal judgment and insight. Alert and oriented x 3. Normal mood and appropriate affect.   Labs on Admission: I have personally reviewed following labs and imaging studies  CBC: Recent Labs  Lab 10/17/17 0813 10/17/17 0823  WBC 6.1  --   NEUTROABS 3.1  --   HGB 12.7 13.3  HCT 39.4 39.0  MCV 87.6  --   PLT 330  --    Basic Metabolic Panel: Recent Labs  Lab 10/17/17 0823  NA 140  K 3.4*  CL 101  GLUCOSE 159*  BUN 13  CREATININE 0.70   GFR: Estimated Creatinine Clearance: 62.2 mL/min (by C-G formula based on SCr of 0.7 mg/dL). Liver Function  Tests: No results for input(s): AST, ALT, ALKPHOS, BILITOT, PROT, ALBUMIN in the last 168 hours. No results for input(s): LIPASE, AMYLASE in the last 168 hours. No results for input(s): AMMONIA in the last 168 hours. Coagulation Profile: No results for input(s): INR, PROTIME in the last 168 hours. Cardiac Enzymes: No results for input(s): CKTOTAL, CKMB, CKMBINDEX, TROPONINI in the last 168 hours. BNP (last 3 results) No results for input(s): PROBNP in the last 8760 hours. HbA1C: No results for input(s): HGBA1C in the last 72 hours. CBG: No results for input(s): GLUCAP in the last 168 hours. Lipid Profile: No results for input(s): CHOL, HDL, LDLCALC, TRIG, CHOLHDL, LDLDIRECT in the last 72 hours. Thyroid Function Tests: No results for input(s): TSH, T4TOTAL, FREET4, T3FREE, THYROIDAB in the last 72 hours. Anemia Panel:  No results for input(s): VITAMINB12, FOLATE, FERRITIN, TIBC, IRON, RETICCTPCT in the last 72 hours. Urine analysis:    Component Value Date/Time   COLORURINE YELLOW 06/29/2017 1316   APPEARANCEUR CLEAR 06/29/2017 1316   LABSPEC <1.005 (L) 06/29/2017 1316   PHURINE 6.0 06/29/2017 1316   GLUCOSEU NEGATIVE 06/29/2017 1316   HGBUR NEGATIVE 06/29/2017 1316   BILIRUBINUR NEGATIVE 06/29/2017 1316   KETONESUR NEGATIVE 06/29/2017 1316   PROTEINUR NEGATIVE 06/29/2017 1316   UROBILINOGEN 0.2 08/16/2012 1205   NITRITE NEGATIVE 06/29/2017 1316   LEUKOCYTESUR TRACE (A) 06/29/2017 1316   Sepsis Labs: !!!!!!!!!!!!!!!!!!!!!!!!!!!!!!!!!!!!!!!!!!!! @LABRCNTIP (procalcitonin:4,lacticidven:4) )No results found for this or any previous visit (from the past 240 hour(s)).   Radiological Exams on Admission: Dg Chest 2 View  Result Date: 10/17/2017 CLINICAL DATA:  Shortness of Breath EXAM: CHEST - 2 VIEW COMPARISON:  06/29/2017 FINDINGS: Cardiac shadow is stable. The lungs are hyper aerated bilaterally. No focal infiltrate or sizable effusion is seen. No acute bony abnormality is noted.  IMPRESSION: Mild changes of COPD without acute abnormality. Electronically Signed   By: Inez Catalina M.D.   On: 10/17/2017 08:46   EKG: Independently reviewed. Showed a Sinus Rhythm at a rate of 74 with no appreciable ST Elevation on my interpretation but did have baseline wander.   Assessment/Plan Active Problems:   COPD (chronic obstructive pulmonary disease) ([facility])   Acute respiratory failure with hypoxia ([facility])   Hypokalemia   Near syncope   Hyperlipidemia   GERD (gastroesophageal reflux disease)   Bladder disease or syndrome   Depression  ACUTE ISSUES  Acute Respiratory Failure with Hypoxia in the Setting of Acute COPD Exacerbation -Place in Obs Med Surge as she is significantly improved from ED visit per patient -Continuous pulse oximetry and maintain O2 saturations greater than 90% -Continue with supplemental oxygen via nasal cannula and wean O2 as tolerated -Treatment as below -Repeat chest x-ray in the a.m. -We will need ambulatory home O2 screen prior to discharge  Acute Exacerbation of COPD  -Patient was given a 5 mg nebulized albuterol treatment along with a 2.5 mg nebulized treatment in the ED along with a DuoNeb breathing treatment -We will schedule Xopenex/Atrovent every 6h and albuterol 2.5 mg nebs as needed -Patient was given p.o. prednisone 40 mg po in the ED and will place the patient on Solu-Medrol 40 mg every 8 scheduled and wean and likely discharge on a home prednisone taper -Placed on IV Azithromycin for COPD exacerbation -Start on guaifenesin 1200 mg p.o. twice daily, flutter valve, incentive spirometer -Start gentle normal saline at rate of 50 mL/per hour -CXR showed Mild changes of COPD without acute abnormality. -Check Respiratory Virus Panel and place on Empiric Droplet Precautions  Hypokalemia -Potassium on admission was 3.4 -Replete with potassium chloride 40 mg twice daily x2 doses -Check magnesium level -Continue to monitor and replete as  necessary -Repeat CMP in a.m.  Hyperglycemia -Patient's Blood Sugar on Admission was  -Check HbA1c -Expect Blood Sugar to elevate in the setting of IV Steroids -If continues to be elevated place patient on sensitive NovoLog sliding scale AC/HS  Near-syncope -In the setting of hypoxia -Gently hydrate with normal saline rate of 50 mils per hour -Check orthostatic vital signs in a.m. -If continues to have lightheadedness and dizziness will pursue a CT of the head without contrast  CHRONIC ISSUES  HTN -Continue with Amlodipine 5 mg p.o. daily along with Lisinopril 20 mg p.o. daily  HLD -Check Lipid Panel in AM -Diet Controlled and not  on any Home medications  GERD -Patient on pantoprazole 40 mg p.o. daily  Depression -Continue with Olanzapine 5 mg p.o. nightly along with Trazodone 100 mg p.o. daily at bedtime  Bladder Disease  -Unspecified.  Patient states urology does not know what it is and there is why she uses self-catheterization -Up with urology in outpatient setting  DVT prophylaxis: Heparin 5,000 units sq q8h Code Status: FULL CODE Family Communication: No family present at bedside  Disposition Plan: Anticipate D/C to Home Environment when medically stable Consults called: None Admission status: Obs Med Surge  Severity of Illness: The appropriate patient status for this patient is OBSERVATION. Observation status is judged to be reasonable and necessary in order to provide the required intensity of service to ensure the patient's safety. The patient's presenting symptoms, physical exam findings, and initial radiographic and laboratory data in the context of their medical condition is felt to place them at decreased risk for further clinical deterioration. Furthermore, it is anticipated that the patient will be medically stable for discharge from the hospital within 2 midnights of admission. The following factors support the patient status of observation.   " The  patient's presenting symptoms include wrosening shortness of breath with wheezing. " The physical exam findings include diminished. " The initial radiographic and laboratory data are reassuring and showing COPD exacerbation.  Kerney Elbe, D.O. [facility] Pager [phone]  If 7PM-7AM, please contact night-coverage [email or web address] Password [facility]  10/17/2017, 1:53 PM

## 2017-10-17 NOTE — ED Notes (Signed)
Patient ran out of Albuterol sol'n. 10/16/17.  Hasn't had HFA albuterol for sometime.

## 2017-10-18 ENCOUNTER — Observation Stay (HOSPITAL_COMMUNITY): Payer: Self-pay

## 2017-10-18 DIAGNOSIS — J9601 Acute respiratory failure with hypoxia: Secondary | ICD-10-CM

## 2017-10-18 DIAGNOSIS — J441 Chronic obstructive pulmonary disease with (acute) exacerbation: Principal | ICD-10-CM

## 2017-10-18 LAB — CBC WITH DIFFERENTIAL/PLATELET
Basophils Absolute: 0 10*3/uL (ref 0.0–0.1)
Basophils Relative: 0 %
Eosinophils Absolute: 0 10*3/uL (ref 0.0–0.7)
Eosinophils Relative: 0 %
HCT: 37.6 % (ref 36.0–46.0)
Hemoglobin: 12.1 g/dL (ref 12.0–15.0)
Lymphocytes Relative: 11 %
Lymphs Abs: 1.4 10*3/uL (ref 0.7–4.0)
MCH: 28.3 pg (ref 26.0–34.0)
MCHC: 32.2 g/dL (ref 30.0–36.0)
MCV: 88.1 fL (ref 78.0–100.0)
Monocytes Absolute: 0.4 10*3/uL (ref 0.1–1.0)
Monocytes Relative: 3 %
Neutro Abs: 11.4 10*3/uL — ABNORMAL HIGH (ref 1.7–7.7)
Neutrophils Relative %: 86 %
Platelets: 387 10*3/uL (ref 150–400)
RBC: 4.27 MIL/uL (ref 3.87–5.11)
RDW: 15.6 % — ABNORMAL HIGH (ref 11.5–15.5)
WBC: 13.2 10*3/uL — ABNORMAL HIGH (ref 4.0–10.5)

## 2017-10-18 LAB — COMPREHENSIVE METABOLIC PANEL
ALT: 15 U/L (ref 0–44)
AST: 20 U/L (ref 15–41)
Albumin: 3.7 g/dL (ref 3.5–5.0)
Alkaline Phosphatase: 79 U/L (ref 38–126)
Anion gap: 7 (ref 5–15)
BUN: 16 mg/dL (ref 8–23)
CO2: 25 mmol/L (ref 22–32)
Calcium: 9.2 mg/dL (ref 8.9–10.3)
Chloride: 108 mmol/L (ref 98–111)
Creatinine, Ser: 0.72 mg/dL (ref 0.44–1.00)
GFR calc Af Amer: >60 mL/min (ref >60)
GFR calc non Af Amer: >60 mL/min (ref >60)
Glucose, Bld: 141 mg/dL — ABNORMAL HIGH (ref 70–99)
Potassium: 4.5 mmol/L (ref 3.5–5.1)
Sodium: 140 mmol/L (ref 135–145)
Total Bilirubin: 0.5 mg/dL (ref 0.3–1.2)
Total Protein: 6.4 g/dL — ABNORMAL LOW (ref 6.5–8.1)

## 2017-10-18 LAB — LIPID PANEL
Cholesterol: 249 mg/dL — ABNORMAL HIGH (ref 0–200)
HDL: 64 mg/dL (ref >40)
LDL Cholesterol: 164 mg/dL — ABNORMAL HIGH (ref 0–99)
Total CHOL/HDL Ratio: 3.9 RATIO
Triglycerides: 106 mg/dL (ref <150)
VLDL: 21 mg/dL (ref 0–40)

## 2017-10-18 LAB — GLUCOSE, CAPILLARY: Glucose-Capillary: 118 mg/dL — ABNORMAL HIGH (ref 70–99)

## 2017-10-18 LAB — MAGNESIUM: Magnesium: 2.1 mg/dL (ref 1.7–2.4)

## 2017-10-18 LAB — HEMOGLOBIN A1C
Hgb A1c MFr Bld: 6.1 % — ABNORMAL HIGH (ref 4.8–5.6)
Mean Plasma Glucose: 128.37 mg/dL

## 2017-10-18 LAB — HIV ANTIBODY (ROUTINE TESTING W REFLEX): HIV Screen 4th Generation wRfx: NONREACTIVE

## 2017-10-18 LAB — PHOSPHORUS: Phosphorus: 3.1 mg/dL (ref 2.5–4.6)

## 2017-10-18 MED ORDER — ALBUTEROL SULFATE HFA 108 (90 BASE) MCG/ACT IN AERS: 2.0000 | INHALATION_SPRAY | RESPIRATORY_TRACT | 2 refills | Status: DC | PRN

## 2017-10-18 MED ORDER — MOMETASONE FURO-FORMOTEROL FUM 100-5 MCG/ACT IN AERO: 2.0000 | INHALATION_SPRAY | Freq: Two times a day (BID) | RESPIRATORY_TRACT | 1 refills | Status: DC

## 2017-10-18 MED ORDER — AZITHROMYCIN 500 MG PO TABS: 500.0000 mg | ORAL_TABLET | Freq: Every day | ORAL | 0 refills | Status: AC

## 2017-10-18 MED ORDER — PREDNISONE 10 MG PO TABS: ORAL_TABLET | ORAL | 0 refills | Status: DC

## 2017-10-18 MED ORDER — PANTOPRAZOLE SODIUM 40 MG PO TBEC: 40.0000 mg | DELAYED_RELEASE_TABLET | Freq: Every day | ORAL | 0 refills | Status: DC

## 2017-10-18 MED ORDER — ALBUTEROL SULFATE (2.5 MG/3ML) 0.083% IN NEBU: 2.5000 mg | INHALATION_SOLUTION | RESPIRATORY_TRACT | 12 refills | Status: DC | PRN

## 2017-10-18 MED ORDER — MOMETASONE FURO-FORMOTEROL FUM 100-5 MCG/ACT IN AERO
2.0000 | INHALATION_SPRAY | Freq: Two times a day (BID) | RESPIRATORY_TRACT | Status: DC
  Filled 2017-10-18: qty 8.8

## 2017-10-18 MED ORDER — AZITHROMYCIN 250 MG PO TABS
500.0000 mg | ORAL_TABLET | Freq: Every day | ORAL | Status: DC
  Administered 2017-10-18: 500 mg via ORAL
  Filled 2017-10-18: qty 2

## 2017-10-18 NOTE — Discharge Summary (Signed)
Monica Scott, is a 62 y.o. female  DOB 12-12-1955  MRN [id number].  Admission date:  10/17/2017  Admitting Physician  Kerney Elbe, DO  Discharge Date:  10/18/2017   Primary MD  Patient, No Pcp Per  Recommendations for primary care physician for things to follow:  -Please check CBC, BMP during next visit   Admission Diagnosis  COPD exacerbation ([place]) [J44.1]   Discharge Diagnosis  COPD exacerbation ([place]) [J44.1]    Active Problems:   COPD (chronic obstructive pulmonary disease) ([place])   Acute respiratory failure with hypoxia ([place])   Hypokalemia   Near syncope   Hyperlipidemia   GERD (gastroesophageal reflux disease)   Bladder disease or syndrome   Depression      Past Medical History:  Diagnosis Date  . Asthma   . Bladder disease   . COPD (chronic obstructive pulmonary disease) ([place])   . Depression   . Hyperlipidemia   . Hypertension   . Peptic ulcer     Past Surgical History:  Procedure Laterality Date  . ABDOMINAL HYSTERECTOMY         History of present illness and  Hospital Course:     Kindly see H&P for history of present illness and admission details, please review complete Labs, Consult reports and Test reports for all details in brief  HPI  from the history and physical done on the day of admission 10/17/2017  HPI: Monica Scott is a 62 y.o. female with medical history significant of tension, hyperlipidemia, peptic ulcer disease, COPD and asthma, history of bladder disease where she self caths, and other comorbidities who states that she presents for worsening shortness of breath.  Patient has had SOB during the last year but progressively worse in the last month.  She has been using her and inhaler but ran out and currently using her dead brother's albuterol Neb but ran out of that as well and ran out last week.  For the last last couple days has had progressively  worsening and severe shortness of breath with associated wheezing.  She has not had very much sputum production or coughing but does admit to having associated Symptoms of dizziness, lightheadedness where she was close to fainting or shortness of breath. No CP. Not had a cough. No Sputum production.  She has a new O2 Requirement. No N/V/D. Self Caths. No weakness.  Denies any other complaints or concerns.  Had x-ray done admission which showed mild changes of COPD.  [facility] was consulted to admit this patient for a COPD exacerbation and acute respiratory failure with hypoxia.  ED Course: Recieved 3 breathing treatments (2.5 mg Neb of Albuterol, 5 mg Neb of Albuterol, and a DuoNeb) and po Prednisone; Had basic blood work done along with a 2 view CXR.      Hospital Course   Acute Respiratory Failure with Hypoxia in the Setting of Acute COPD Exacerbation -No evidence of pneumonia, this is COPD exacerbation, she is on nebs, steroids, feeling much better, still hypoxic on exertion,  she will need home oxygen on discharge.  COPD exacerbation -Reports some cough with a productive sputum, continue with azithromycin for 4 days, she will be discharged on prednisone taper, she will be started on Dulera, PRN albuterol inhaler/nebs.  Hypokalemia -Potassium on admission was 3.4, was repleted  Hyperglycemia -A1c is 6.1, she was counseled about low-carb diet  Near-syncope -In the setting of hypoxia  HTN -Continue with home meds  GERD -Patient on pantoprazole 40 mg p.o. daily  Depression -Continue with Olanzapine 5 mg p.o. nightly along with Trazodone 100 mg p.o. daily at bedtime  Bladder Disease  -Unspecified.  Patient states urology does not know what it is and there is why she uses self-catheterization -Up with urology in outpatient setting   Discharge Condition:   stable   Follow UP  Follow-up Information    [facility]. Schedule an appointment as soon  as possible for a visit.   Why:  call for appointment Contact information: [address] [place] [address] [phone]            Discharge Instructions  and  Discharge Medications    Discharge Instructions    Discharge instructions   Complete by:  As directed    Follow with Primary MD in 7 days   Get CBC, CMP,  checked  by Primary MD next visit.    Activity: As tolerated with Full fall precautions use walker/cane & assistance as needed   Disposition Home    Diet: Heart Healthy  , with feeding assistance and aspiration precautions.  For Heart failure patients - Check your Weight same time everyday, if you gain over 2 pounds, or you develop in leg swelling, experience more shortness of breath or chest pain, call your Primary MD immediately. Follow Cardiac Low Salt Diet and 1.5 lit/day fluid restriction.   On your next visit with your primary care physician please Get Medicines reviewed and adjusted.   Please request your Prim.MD to go over all Hospital Tests and Procedure/Radiological results at the follow up, please get all Hospital records sent to your Prim MD by signing hospital release before you go home.   If you experience worsening of your admission symptoms, develop shortness of breath, life threatening emergency, suicidal or homicidal thoughts you must seek medical attention immediately by calling 911 or calling your MD immediately  if symptoms less severe.  You Must read complete instructions/literature along with all the possible adverse reactions/side effects for all the Medicines you take and that have been prescribed to you. Take any new Medicines after you have completely understood and accpet all the possible adverse reactions/side effects.   Do not drive, operating heavy machinery, perform activities at heights, swimming or participation in water activities or provide baby sitting services if your were admitted for syncope or  siezures until you have seen by Primary MD or a Neurologist and advised to do so again.  Do not drive when taking Pain medications.    Do not take more than prescribed Pain, Sleep and Anxiety Medications  Special Instructions: If you have smoked or chewed Tobacco  in the last 2 yrs please stop smoking, stop any regular Alcohol  and or any Recreational drug use.  Wear Seat belts while driving.   Please note  You were cared for by a hospitalist during your hospital stay. If you have any questions about your discharge medications or the care you received while you were in the hospital after you  are discharged, you can call the unit and asked to speak with the hospitalist on call if the hospitalist that took care of you is not available. Once you are discharged, your primary care physician will handle any further medical issues. Please note that NO REFILLS for any discharge medications will be authorized once you are discharged, as it is imperative that you return to your primary care physician (or establish a relationship with a primary care physician if you do not have one) for your aftercare needs so that they can reassess your need for medications and monitor your lab values.   Increase activity slowly   Complete by:  As directed      Allergies as of 10/18/2017   No Known Allergies     Medication List    STOP taking these medications   BC HEADACHE POWDER PO     TAKE these medications   albuterol (2.5 MG/3ML) 0.083% nebulizer solution Commonly known as:  PROVENTIL Take 3 mLs (2.5 mg total) 3 (three) times daily as needed by nebulization for wheezing or shortness of breath. What changed:  Another medication with the same name was added. Make sure you understand how and when to take each.   albuterol (2.5 MG/3ML) 0.083% nebulizer solution Commonly known as:  PROVENTIL Take 3 mLs (2.5 mg total) by nebulization every 2 (two) hours as needed for wheezing or shortness of breath. What  changed:  You were already taking a medication with the same name, and this prescription was added. Make sure you understand how and when to take each.   albuterol 108 (90 Base) MCG/ACT inhaler Commonly known as:  PROVENTIL HFA;VENTOLIN HFA Inhale 2 puffs into the lungs every 4 (four) hours as needed for wheezing or shortness of breath. What changed:  Another medication with the same name was added. Make sure you understand how and when to take each.   amLODipine 5 MG tablet Commonly known as:  NORVASC TAKE 1 TABLET BY MOUTH ONCE DAILY.   azithromycin 500 MG tablet Commonly known as:  ZITHROMAX Take 1 tablet (500 mg total) by mouth daily for 4 days.   Fluoxetine HCl (PMDD) 20 MG Tabs Take 20 mg by mouth daily.   gabapentin 400 MG capsule Commonly known as:  NEURONTIN Take 400 mg by mouth 3 (three) times daily.   ibuprofen 600 MG tablet Commonly known as:  ADVIL,MOTRIN Take 1 tablet (600 mg total) by mouth every 6 (six) hours as needed for moderate pain.   lisinopril 20 MG tablet Commonly known as:  PRINIVIL,ZESTRIL Take 1 tablet (20 mg total) daily by mouth.   mometasone-formoterol 100-5 MCG/ACT Aero Commonly known as:  DULERA Inhale 2 puffs into the lungs 2 (two) times daily.   OLANZapine 5 MG tablet Commonly known as:  ZYPREXA Take 5 mg by mouth at bedtime.   pantoprazole 40 MG tablet Commonly known as:  PROTONIX Take 1 tablet (40 mg total) by mouth daily. Start taking on:  10/19/2017   predniSONE 10 MG tablet Commonly known as:  DELTASONE Take 40 mg oral daily for 3 days, then 30 mg oral daily for 3 days, then 20 mg oral daily for 3 days, then 10 mg oral daily for 3 days, then stop.   traZODone 100 MG tablet Commonly known as:  DESYREL Take 100 mg by mouth at bedtime as needed for sleep.            Durable Medical Equipment  (From admission, onward)  Start     Ordered   10/18/17 1227  DME Oxygen  Once    Question Answer Comment  Frequency  Continuous (stationary and portable oxygen unit needed)   Oxygen conserving device Yes   Oxygen delivery system Gas      10/18/17 1227        Diet and Activity recommendation: See Discharge Instructions above   Consults obtained - None   Major procedures and Radiology Reports - PLEASE review detailed and final reports for all details, in brief -      Dg Chest 2 View  Result Date: 10/18/2017 CLINICAL DATA:  Shortness of breath EXAM: CHEST - 2 VIEW COMPARISON:  October 17, 2017 FINDINGS: There is bibasilar atelectatic change. There is no edema or consolidation. Heart size and pulmonary vascularity are normal. No adenopathy. Foci of great vessel calcification noted. No evident bone lesions. IMPRESSION: Bibasilar atelectasis. No edema or consolidation. Stable cardiac silhouette. Electronically Signed   By: Lowella Grip III M.D.   On: 10/18/2017 09:07   Dg Chest 2 View  Result Date: 10/17/2017 CLINICAL DATA:  Shortness of Breath EXAM: CHEST - 2 VIEW COMPARISON:  06/29/2017 FINDINGS: Cardiac shadow is stable. The lungs are hyper aerated bilaterally. No focal infiltrate or sizable effusion is seen. No acute bony abnormality is noted. IMPRESSION: Mild changes of COPD without acute abnormality. Electronically Signed   By: Inez Catalina M.D.   On: 10/17/2017 08:46    Micro Results     Recent Results (from the past 240 hour(s))  Respiratory Panel by PCR     Status: None   Collection Time: 10/17/17 12:47 PM  Result Value Ref Range Status   Adenovirus NOT DETECTED NOT DETECTED Final   Coronavirus 229E NOT DETECTED NOT DETECTED Final   Coronavirus HKU1 NOT DETECTED NOT DETECTED Final   Coronavirus NL63 NOT DETECTED NOT DETECTED Final   Coronavirus OC43 NOT DETECTED NOT DETECTED Final   Metapneumovirus NOT DETECTED NOT DETECTED Final   Rhinovirus / Enterovirus NOT DETECTED NOT DETECTED Final   Influenza A NOT DETECTED NOT DETECTED Final   Influenza B NOT DETECTED NOT DETECTED Final     Parainfluenza Virus 1 NOT DETECTED NOT DETECTED Final   Parainfluenza Virus 2 NOT DETECTED NOT DETECTED Final   Parainfluenza Virus 3 NOT DETECTED NOT DETECTED Final   Parainfluenza Virus 4 NOT DETECTED NOT DETECTED Final   Respiratory Syncytial Virus NOT DETECTED NOT DETECTED Final   Bordetella pertussis NOT DETECTED NOT DETECTED Final   Chlamydophila pneumoniae NOT DETECTED NOT DETECTED Final   Mycoplasma pneumoniae NOT DETECTED NOT DETECTED Final       Today   Subjective:   Monica Scott today has no headache,no chest or abdominal pain,no new weakness tingling or numbness, feels much better wants to go home today.  Ports some productive phlegm, dyspnea has improved  Objective:   Blood pressure 122/70, pulse 85, temperature 97.7 F (36.5 C), temperature source Oral, resp. rate (!) 22, height 4\' 11"  (1.499 m), weight 73.1 kg (161 lb 2.5 oz), SpO2 92 %.   Intake/Output Summary (Last 24 hours) at 10/18/2017 1227 Last data filed at 10/18/2017 0500 Gross per 24 hour  Intake 695 ml  Output 600 ml  Net 95 ml    Exam Awake Alert, Oriented x 3, No new F.N deficits, Normal affect Symmetrical Chest wall movement, Good air movement bilaterally, no wheezing RRR,No Gallops,Rubs or new Murmurs, No Parasternal Heave +ve B.Sounds, Abd Soft, Non tender,No rebound -guarding or rigidity.  No Cyanosis, Clubbing or edema, No new Rash or bruise  Data Review   CBC w Diff:  Lab Results  Component Value Date   WBC 13.2 (H) 10/18/2017   HGB 12.1 10/18/2017   HCT 37.6 10/18/2017   PLT 387 10/18/2017   LYMPHOPCT 11 10/18/2017   BANDSPCT 0 07/10/2009   MONOPCT 3 10/18/2017   EOSPCT 0 10/18/2017   BASOPCT 0 10/18/2017    CMP:  Lab Results  Component Value Date   NA 140 10/18/2017   K 4.5 10/18/2017   CL 108 10/18/2017   CO2 25 10/18/2017   BUN 16 10/18/2017   CREATININE 0.72 10/18/2017   PROT 6.4 (L) 10/18/2017   ALBUMIN 3.7 10/18/2017   BILITOT 0.5 10/18/2017   ALKPHOS 79  10/18/2017   AST 20 10/18/2017   ALT 15 10/18/2017  .   Total Time in preparing paper work, data evaluation and todays exam - 63 minutes  Phillips Climes M.D on 10/18/2017 at 12:27 PM  [facility]   Office  [phone]

## 2017-10-18 NOTE — Progress Notes (Signed)
SATURATION QUALIFICATIONS: (This note is used to comply with regulatory documentation for home oxygen)  Patient Saturations on Room Air at Rest = 90%  Patient Saturations on Room Air while Ambulating = 87%  Patient Saturations on 1 Liters of oxygen while Ambulating = 92%  Please briefly explain why patient needs home oxygen: All other treatments have been tried and failed

## 2017-10-18 NOTE — Progress Notes (Deleted)
Patient under review at [facility].

## 2017-10-18 NOTE — Discharge Instructions (Signed)
Follow with Primary MDin 7 days  ? ?Get CBC, CMP,  checked  by Primary MD next visit.  ? ? ?Activity: As tolerated with Full fall precautions use walker/cane & assistance as needed ? ?Disposition Home ? ? ?Diet: Heart Healthy  , with feeding assistance and aspiration precautions. ? ?For Heart failure patients - Check your Weight same time everyday, if you gain over 2 pounds, or you develop in leg swelling, experience more shortness of breath or chest pain, call your Primary MD immediately. Follow Cardiac Low Salt Diet and 1.5 lit/day fluid restriction. ? ? ?On your next visit with your primary care physician please Get Medicines reviewed and adjusted. ? ? ?Please request your Prim.MD to go over all Hospital Tests and Procedure/Radiological results at the follow up, please get all Hospital records sent to your Prim MD by signing hospital release before you go home. ? ? ?If you experience worsening of your admission symptoms, develop shortness of breath, life threatening emergency, suicidal or homicidal thoughts you must seek medical attention immediately by calling 911 or calling your MD immediately  if symptoms less severe. ? ?You Must read complete instructions/literature along with all the possible adverse reactions/side effects for all the Medicines you take and that have been prescribed to you. Take any new Medicines after you have completely understood and accpet all the possible adverse reactions/side effects.  ? ?Do not drive, operating heavy machinery, perform activities at heights, swimming or participation in water activities or provide baby sitting services if your were admitted for syncope or siezures until you have seen by Primary MD or a Neurologist and advised to do so again. ? ?Do not drive when taking Pain medications.  ? ? ?Do not take more than prescribed Pain, Sleep and Anxiety Medications ? ?Special Instructions: If you have smoked or chewed Tobacco  in the last 2 yrs please stop smoking, stop  any regular Alcohol  and or any Recreational drug use. ? ?Wear Seat belts while driving. ? ? ?Please note ? ?You were cared for by a hospitalist during your hospital stay. If you have any questions about your discharge medications or the care you received while you were in the hospital after you are discharged, you can call the unit and asked to speak with the hospitalist on call if the hospitalist that took care of you is not available. Once you are discharged, your primary care physician will handle any further medical issues. Please note that NO REFILLS for any discharge medications will be authorized once you are discharged, as it is imperative that you return to your primary care physician (or establish a relationship with a primary care physician if you do not have one) for your aftercare needs so that they can reassess your need for medications and monitor your lab values.  ? ?

## 2017-10-20 ENCOUNTER — Ambulatory Visit: Payer: Self-pay | Attending: Physician Assistant | Admitting: Physician Assistant

## 2017-10-20 VITALS — BP 139/87 | HR 78 | Temp 98.2°F | Resp 18 | Ht 59.0 in | Wt 157.0 lb

## 2017-10-20 DIAGNOSIS — I10 Essential (primary) hypertension: Secondary | ICD-10-CM | POA: Insufficient documentation

## 2017-10-20 DIAGNOSIS — K219 Gastro-esophageal reflux disease without esophagitis: Secondary | ICD-10-CM | POA: Insufficient documentation

## 2017-10-20 DIAGNOSIS — Z09 Encounter for follow-up examination after completed treatment for conditions other than malignant neoplasm: Secondary | ICD-10-CM

## 2017-10-20 DIAGNOSIS — Z8711 Personal history of peptic ulcer disease: Secondary | ICD-10-CM | POA: Insufficient documentation

## 2017-10-20 DIAGNOSIS — F329 Major depressive disorder, single episode, unspecified: Secondary | ICD-10-CM | POA: Insufficient documentation

## 2017-10-20 DIAGNOSIS — Z79899 Other long term (current) drug therapy: Secondary | ICD-10-CM | POA: Insufficient documentation

## 2017-10-20 DIAGNOSIS — J441 Chronic obstructive pulmonary disease with (acute) exacerbation: Secondary | ICD-10-CM

## 2017-10-20 DIAGNOSIS — N329 Bladder disorder, unspecified: Secondary | ICD-10-CM

## 2017-10-20 DIAGNOSIS — R739 Hyperglycemia, unspecified: Secondary | ICD-10-CM

## 2017-10-20 DIAGNOSIS — E785 Hyperlipidemia, unspecified: Secondary | ICD-10-CM | POA: Insufficient documentation

## 2017-10-20 LAB — GLUCOSE, POCT (MANUAL RESULT ENTRY): POC Glucose: 77 mg/dl (ref 70–99)

## 2017-10-20 MED ORDER — IPRATROPIUM-ALBUTEROL 0.5-2.5 (3) MG/3ML IN SOLN
3.0000 mL | Freq: Once | RESPIRATORY_TRACT | Status: AC
  Administered 2017-10-20: 3 mL via RESPIRATORY_TRACT

## 2017-10-20 MED ORDER — ALBUTEROL SULFATE (2.5 MG/3ML) 0.083% IN NEBU: 2.5000 mg | INHALATION_SOLUTION | RESPIRATORY_TRACT | 12 refills | Status: DC | PRN

## 2017-10-20 MED ORDER — MOMETASONE FURO-FORMOTEROL FUM 100-5 MCG/ACT IN AERO: 2.0000 | INHALATION_SPRAY | Freq: Two times a day (BID) | RESPIRATORY_TRACT | 1 refills | Status: DC

## 2017-10-20 MED ORDER — ALBUTEROL SULFATE HFA 108 (90 BASE) MCG/ACT IN AERS: 2.0000 | INHALATION_SPRAY | RESPIRATORY_TRACT | 2 refills | Status: DC | PRN

## 2017-10-20 MED FILL — !VENTOLIN HFA INHALER: 108 (90 BAS | 16 days supply | Qty: 18 | Fill #0

## 2017-10-20 MED FILL — ALBUTEROL SUL 2.5 MG/3 ML S: (2.5 MG/3ML | 2 days supply | Qty: 75 | Fill #0

## 2017-10-20 MED FILL — !DULERA 100 MCG/5 MCG INH: 100-5 | 30 days supply | Qty: 13 | Fill #0

## 2017-10-20 NOTE — Patient Instructions (Signed)
Hyperglycemia Hyperglycemia is when the sugar (glucose) level in your blood is too high. It may not cause symptoms. If you do have symptoms, they may include warning signs, such as:  Feeling more thirsty than normal.  Hunger.  Feeling tired.  Needing to pee (urinate) more than normal.  Blurry eyesight (vision). You may get other symptoms as it gets worse, such as:  Dry mouth.  Not being hungry (loss of appetite).  Fruity-smelling breath.  Weakness.  Weight gain or loss that is not planned. Weight loss may be fast.  A tingling or numb feeling in your hands or feet.  Headache.  Skin that does not bounce back quickly when it is lightly pinched and released (poor skin turgor).  Pain in your belly (abdomen).  Cuts or bruises that heal slowly. High blood sugar can happen to people who do or do not have diabetes. High blood sugar can happen slowly or quickly, and it can be an emergency. Follow these instructions at home: General instructions  Take over-the-counter and prescription medicines only as told by your doctor.  Do not use products that contain nicotine or tobacco, such as cigarettes and e-cigarettes. If you need help quitting, ask your doctor.  Limit alcohol intake to no more than 1 drink per day for nonpregnant women and 2 drinks per day for men. One drink equals 12 oz of beer, 5 oz of wine, or 1 oz of hard liquor.  Manage stress. If you need help with this, ask your doctor.  Keep all follow-up visits as told by your doctor. This is important. Eating and drinking  Stay at a healthy weight.  Exercise regularly, as told by your doctor.  Drink enough fluid, especially when you:  Exercise.  Get sick.  Are in hot temperatures.  Eat healthy foods, such as:  Low-fat (lean) proteins.  Complex carbs (complex carbohydrates), such as whole wheat bread or brown rice.  Fresh fruits and vegetables.  Low-fat dairy products.  Healthy fats.  Drink enough  fluid to keep your pee (urine) clear or pale yellow. If you have diabetes:  Make sure you know the symptoms of hyperglycemia.  Follow your diabetes management plan, as told by your doctor. Make sure you:  Take insulin and medicines as told.  Follow your exercise plan.  Follow your meal plan. Eat on time. Do not skip meals.  Check your blood sugar as often as told. Make sure to check before and after exercise. If you exercise longer or in a different way than you normally do, check your blood sugar more often.  Follow your sick day plan whenever you cannot eat or drink normally. Make this plan ahead of time with your doctor.  Share your diabetes management plan with people in your workplace, school, and household.  Check your urine for ketones when you are ill and as told by your doctor.  Carry a card or wear jewelry that says that you have diabetes. Contact a doctor if:  Your blood sugar level is higher than 240 mg/dL (13.3 mmol/L) for 2 days in a row.  You have problems keeping your blood sugar in your target range.  High blood sugar happens often for you. Get help right away if:  You have trouble breathing.  You have a change in how you think, feel, or act (mental status).  You feel sick to your stomach (nauseous), and that feeling does not go away.  You cannot stop throwing up (vomiting). These symptoms may be an   emergency. Do not wait to see if the symptoms will go away. Get medical help right away. Call your local emergency services (911 in the [place] Do not drive yourself to the hospital.  Summary  Hyperglycemia is when the sugar (glucose) level in your blood is too high.  High blood sugar can happen to people who do or do not have diabetes.  Make sure you drink enough fluids, eat healthy foods, and exercise regularly.  Contact your doctor if you have problems keeping your blood sugar in your target range. This information is not intended to replace advice given  to you by your health care provider. Make sure you discuss any questions you have with your health care provider. Document Released: 01/10/2009 Document Revised: 12/01/2015 Document Reviewed: 12/01/2015 [employer] Interactive Patient Education  2017 [employer]  

## 2017-10-20 NOTE — Progress Notes (Signed)
Patient ID: Monica Scott, female   DOB: 08-03-1955, 62 y.o.   MRN: [id number]       Monica Scott, is a 62 y.o. female  XNA:[id number]  RKY:[id number]  DOB - Jan 10, 1956  Subjective:  Chief Complaint and HPI: Monica Scott is a 62 y.o. female here today to establish care and for a follow up visit After hospitalization 7/22-7/23/2019.  She has not gotten any of her inhalers filled.  She feels like she is improving.  Home O2 is helping. No fevers.  She is taking azithromycin and prednisone  Has h/o bladder disease.  She has been I&O cathing herself for years.  She needs a prescription for catheters. She will f/up with urology once financial assistance is in place.   Stable on BP meds.  Blood sugar has been a little high but also on a lot of prednisone recently.   HPI  from the history and physical done on the day of admission 10/17/2017  BTD:[id number] A Oscaris a 62 y.o.femalewith medical history significant oftension, hyperlipidemia, peptic ulcer disease, COPD and asthma, history of bladder disease where she self caths, and other comorbidities who states that she presents for worsening shortness of breath. Patient has had SOB during the last year but progressively worse in the last month.She has been using her and inhalerbut ran outandcurrently using her dead brother's albuterol Neb but ran outof that as well and ran out last week.For the last last couple dayshas had progressively worsening and severe shortnessof breath with associated wheezing. She has not had very much sputum production or coughing but does admit to having associated Symptomsof dizziness,lightheadednesswhere she wasclose to faintingor shortness of breath. No CP. Not had a cough. No Sputum production.She has a newO2 Requirement. No N/V/D. Self Caths. No weakness.Denies any other complaints or concerns. Had x-ray done admission which showed mild changes of COPD.TRH was consulted to admit this patient  for a COPD exacerbation and acute respiratory failure with hypoxia.  ED Course:Recieved3 breathing treatments(2.5 mg Neb of Albuterol, 5 mg Neb of Albuterol, and a DuoNeb)and po Prednisone;Had basic blood work done along with a 2 view CXR.    Hospital Course   Acute Respiratory Failure with Hypoxia in the Setting of Acute COPD Exacerbation -No evidence of pneumonia, this is COPD exacerbation, she is on nebs, steroids, feeling much better, still hypoxic on exertion, she will need home oxygen on discharge.  COPD exacerbation -Reports some cough with a productive sputum, continue with azithromycin for 4 days, she will be discharged on prednisone taper, she will be started on Dulera, PRN albuterol inhaler/nebs.  Hypokalemia -Potassium on admission was 3.4, was repleted  Hyperglycemia -A1c is 6.1, she was counseled about low-carb diet  Near-syncope -In the setting of hypoxia  HTN -Continue with home meds  GERD -Patient on pantoprazole 40 mg p.o. daily  Depression -Continue withOlanzapine 5 mg p.o. nightly along withTrazodone 100 mg p.o. daily at bedtime  Bladder Disease -Unspecified. Patient states urology does not know what it is and there is why she uses self-catheterization -Up with urology in outpatient setting  ED/Hospital notes reviewed.    ROS:   Constitutional:  No f/c, No night sweats, No unexplained weight loss. EENT:  No vision changes, No blurry vision, No hearing changes. No mouth, throat, or ear problems.  Respiratory: +some cough, + SOB Cardiac: No CP, no palpitations GI:  No abd pain, No N/V/D. GU: No Urinary s/sx Musculoskeletal: No joint pain Neuro: No headache, no dizziness, no motor weakness.  Skin: No rash Endocrine:  No polydipsia. No polyuria.  Psych: Denies SI/HI  No problems updated.  ALLERGIES: No Known Allergies  PAST MEDICAL HISTORY: Past Medical History:  Diagnosis Date  . Asthma   . Bladder disease   . COPD  (chronic obstructive pulmonary disease) ([place])   . Depression   . Hyperlipidemia   . Hypertension   . Peptic ulcer     MEDICATIONS AT HOME: Prior to Admission medications   Medication Sig Start Date End Date Taking? Authorizing Provider  albuterol (PROVENTIL HFA;VENTOLIN HFA) 108 (90 Base) MCG/ACT inhaler Inhale 2 puffs into the lungs every 4 (four) hours as needed for wheezing or shortness of breath. 10/20/17   Thereasa Solo, Dionne Bucy, PA-C  albuterol (PROVENTIL) (2.5 MG/3ML) 0.083% nebulizer solution Take 3 mLs (2.5 mg total) 3 (three) times daily as needed by nebulization for wheezing or shortness of breath. 02/11/17   Martinique, Betty G, MD  albuterol (PROVENTIL) (2.5 MG/3ML) 0.083% nebulizer solution Take 3 mLs (2.5 mg total) by nebulization every 2 (two) hours as needed for wheezing or shortness of breath. 10/20/17   Argentina Donovan, PA-C  amLODipine (NORVASC) 5 MG tablet TAKE 1 TABLET BY MOUTH ONCE DAILY. 05/30/17   [place], Betty G, MD  azithromycin (ZITHROMAX) 500 MG tablet Take 1 tablet (500 mg total) by mouth daily for 4 days. 10/18/17 10/22/17  Elgergawy, Silver Huguenin, MD  Fluoxetine HCl, PMDD, 20 MG TABS Take 20 mg by mouth daily. 12/21/14   [provider]  gabapentin (NEURONTIN) 400 MG capsule Take 400 mg by mouth 3 (three) times daily.     [provider]  ibuprofen (ADVIL,MOTRIN) 600 MG tablet Take 1 tablet (600 mg total) by mouth every 6 (six) hours as needed for moderate pain. 03/14/17   Duffy Bruce, MD  lisinopril (PRINIVIL,ZESTRIL) 20 MG tablet Take 1 tablet (20 mg total) daily by mouth. 02/11/17   Martinique, Betty G, MD  mometasone-formoterol [facility]) 100-5 MCG/ACT AERO Inhale 2 puffs into the lungs 2 (two) times daily. 10/20/17   Argentina Donovan, PA-C  OLANZapine (ZYPREXA) 5 MG tablet Take 5 mg by mouth at bedtime.    [provider]  pantoprazole (PROTONIX) 40 MG tablet Take 1 tablet (40 mg total) by mouth daily. 10/19/17   Elgergawy, Silver Huguenin, MD  predniSONE  (DELTASONE) 10 MG tablet Take 40 mg oral daily for 3 days, then 30 mg oral daily for 3 days, then 20 mg oral daily for 3 days, then 10 mg oral daily for 3 days, then stop. 10/18/17   Elgergawy, Silver Huguenin, MD  traZODone (DESYREL) 100 MG tablet Take 100 mg by mouth at bedtime as needed for sleep.     [provider]     Objective:  EXAM:   Vitals:   10/20/17 0912  BP: 139/87  Pulse: 78  Resp: 18  Temp: 98.2 F (36.8 C)  TempSrc: Oral  SpO2: 92%  Weight: 157 lb (71.2 kg)  Height: 4\' 11"  (1.499 m)    General appearance : A&OX3. NAD. Non-toxic-appearing HEENT: Atraumatic and Normocephalic.  PERRLA. EOM intact.  TM clear B. Mouth-MMM, post pharynx WNL w/o erythema, No PND. Neck: supple, no JVD. No cervical lymphadenopathy. No thyromegaly Chest/Lungs:  Breathing-non-labored, fair air entry bilaterally. breath sounds without rales and rhonchi, there is mild wheezing throughout CVS: S1 S2 regular, no murmurs, gallops, rubs  Extremities: Bilateral Lower Ext shows no edema, both legs are warm to touch with = pulse throughout Neurology:  CN II-XII  grossly intact, Non focal.   Psych:  TP linear. J/I WNL. Normal speech. Appropriate eye contact and affect.  Skin:  No Rash  Data Review Lab Results  Component Value Date   HGBA1C 6.1 (H) 10/18/2017     Assessment & Plan   1. Chronic obstructive pulmonary disease with acute exacerbation ([place]) MUST GETS MEDS.  Resent prescriptions to our pharmacy - mometasone-formoterol (DULERA) 100-5 MCG/ACT AERO; Inhale 2 puffs into the lungs 2 (two) times daily.  Dispense: 8.8 g; Refill: 1 - albuterol (PROVENTIL) (2.5 MG/3ML) 0.083% nebulizer solution; Take 3 mLs (2.5 mg total) by nebulization every 2 (two) hours as needed for wheezing or shortness of breath.  Dispense: 75 mL; Refill: 12 - albuterol (PROVENTIL HFA;VENTOLIN HFA) 108 (90 Base) MCG/ACT inhaler; Inhale 2 puffs into the lungs every 4 (four) hours as needed for wheezing or shortness of  breath.  Dispense: 1 Inhaler; Refill: 2  2. Hyperglycemia Diet controlled.  Decrease/eliminate sugar intake and close follow-up esp after completing steroids - Glucose (CBG)  3. Hospital discharge follow-up Some improvement.  Will check labs at next visit if indicated  4. Bladder disease or syndrome Hand written Rx for catheters-can f/up with urology/refer as needed once financial assistance in place   Patient have been counseled extensively about nutrition and exercise  Return in about 3 weeks (around 11/10/2017) for assign PCP f/up COPD exacerbation.  The patient was given clear instructions to go to ER or return to medical center if symptoms don't improve, worsen or new problems develop. The patient verbalized understanding. The patient was told to call to get lab results if they haven't heard anything in the next week.     Freeman Caldron, PA-C [facility] [place], [place]   10/20/2017, 9:34 AM

## 2017-11-21 ENCOUNTER — Ambulatory Visit: Payer: Self-pay | Attending: Family Medicine

## 2017-12-06 ENCOUNTER — Ambulatory Visit: Payer: Self-pay | Attending: Family Medicine | Admitting: Family Medicine

## 2017-12-06 ENCOUNTER — Encounter: Payer: Self-pay | Admitting: Family Medicine

## 2017-12-06 ENCOUNTER — Encounter: Payer: Self-pay | Admitting: Internal Medicine

## 2017-12-06 ENCOUNTER — Other Ambulatory Visit: Payer: Self-pay

## 2017-12-06 VITALS — BP 151/94 | HR 73 | Temp 98.5°F | Resp 20 | Ht 59.0 in | Wt 160.8 lb

## 2017-12-06 DIAGNOSIS — R7303 Prediabetes: Secondary | ICD-10-CM

## 2017-12-06 DIAGNOSIS — R339 Retention of urine, unspecified: Secondary | ICD-10-CM

## 2017-12-06 DIAGNOSIS — G2581 Restless legs syndrome: Secondary | ICD-10-CM

## 2017-12-06 DIAGNOSIS — Z87891 Personal history of nicotine dependence: Secondary | ICD-10-CM | POA: Insufficient documentation

## 2017-12-06 DIAGNOSIS — Z1211 Encounter for screening for malignant neoplasm of colon: Secondary | ICD-10-CM

## 2017-12-06 DIAGNOSIS — E876 Hypokalemia: Secondary | ICD-10-CM | POA: Insufficient documentation

## 2017-12-06 DIAGNOSIS — J449 Chronic obstructive pulmonary disease, unspecified: Secondary | ICD-10-CM

## 2017-12-06 DIAGNOSIS — K219 Gastro-esophageal reflux disease without esophagitis: Secondary | ICD-10-CM

## 2017-12-06 DIAGNOSIS — E785 Hyperlipidemia, unspecified: Secondary | ICD-10-CM

## 2017-12-06 DIAGNOSIS — I10 Essential (primary) hypertension: Secondary | ICD-10-CM

## 2017-12-06 DIAGNOSIS — Z23 Encounter for immunization: Secondary | ICD-10-CM | POA: Insufficient documentation

## 2017-12-06 DIAGNOSIS — N3000 Acute cystitis without hematuria: Secondary | ICD-10-CM

## 2017-12-06 DIAGNOSIS — Z1231 Encounter for screening mammogram for malignant neoplasm of breast: Secondary | ICD-10-CM

## 2017-12-06 DIAGNOSIS — F329 Major depressive disorder, single episode, unspecified: Secondary | ICD-10-CM | POA: Insufficient documentation

## 2017-12-06 DIAGNOSIS — Z79899 Other long term (current) drug therapy: Secondary | ICD-10-CM

## 2017-12-06 DIAGNOSIS — Z1239 Encounter for other screening for malignant neoplasm of breast: Secondary | ICD-10-CM

## 2017-12-06 LAB — POCT URINALYSIS DIP (CLINITEK)
Bilirubin, UA: NEGATIVE
Blood, UA: NEGATIVE
Glucose, UA: NEGATIVE mg/dL
Ketones, POC UA: NEGATIVE mg/dL
Nitrite, UA: NEGATIVE
POC,PROTEIN,UA: NEGATIVE
Spec Grav, UA: <=1.005 — AB
Urobilinogen, UA: 0.2 U/dL
pH, UA: 6.5

## 2017-12-06 MED ORDER — GABAPENTIN 400 MG PO CAPS: 400.0000 mg | ORAL_CAPSULE | Freq: Three times a day (TID) | ORAL | 11 refills | Status: DC

## 2017-12-06 MED ORDER — ALBUTEROL SULFATE (2.5 MG/3ML) 0.083% IN NEBU: 2.5000 mg | INHALATION_SOLUTION | RESPIRATORY_TRACT | 12 refills | Status: DC | PRN

## 2017-12-06 MED ORDER — MOMETASONE FURO-FORMOTEROL FUM 100-5 MCG/ACT IN AERO: 2.0000 | INHALATION_SPRAY | Freq: Two times a day (BID) | RESPIRATORY_TRACT | 11 refills | Status: DC

## 2017-12-06 MED ORDER — NITROFURANTOIN MONOHYD MACRO 100 MG PO CAPS: 100.0000 mg | ORAL_CAPSULE | Freq: Two times a day (BID) | ORAL | 0 refills | Status: AC

## 2017-12-06 MED ORDER — LISINOPRIL 20 MG PO TABS: 20.0000 mg | ORAL_TABLET | Freq: Two times a day (BID) | ORAL | 6 refills | Status: DC

## 2017-12-06 MED ORDER — ALBUTEROL SULFATE HFA 108 (90 BASE) MCG/ACT IN AERS: 2.0000 | INHALATION_SPRAY | RESPIRATORY_TRACT | 2 refills | Status: DC | PRN

## 2017-12-06 MED ORDER — AMLODIPINE BESYLATE 5 MG PO TABS: 5.0000 mg | ORAL_TABLET | Freq: Every day | ORAL | 1 refills | Status: DC

## 2017-12-06 MED ORDER — PANTOPRAZOLE SODIUM 40 MG PO TBEC: 40.0000 mg | DELAYED_RELEASE_TABLET | Freq: Every day | ORAL | 6 refills | Status: DC

## 2017-12-06 MED FILL — LISINOPRIL 20 MG TAB: 20 | 30 days supply | Qty: 60 | Fill #0

## 2017-12-06 MED FILL — NITROFURANTOIN MONO-MCR 100: 100 | 7 days supply | Qty: 14 | Fill #0

## 2017-12-06 MED FILL — GABAPENTIN 400 MG CAPSULE: 400 | 30 days supply | Qty: 90 | Fill #0

## 2017-12-06 MED FILL — AMLODIPINE BESYLATE 5 MG TA: 5 | 30 days supply | Qty: 30 | Fill #0

## 2017-12-06 MED FILL — !PROVENTIL HFA 90 MCG INH: 108 (90 BAS | 16 days supply | Qty: 1 | Fill #0

## 2017-12-06 MED FILL — ALBUTEROL 0.083% INHAL SOLN: (2.5 MG/3ML | 20 days supply | Qty: 360 | Fill #0

## 2017-12-06 MED FILL — ?PANTOPRAZOLE SO DR 40MG TA: 40 | 30 days supply | Qty: 30 | Fill #0

## 2017-12-06 MED FILL — !DULERA 100 MCG/5 MCG INH: 100-5 | 30 days supply | Qty: 13 | Fill #1

## 2017-12-06 NOTE — Addendum Note (Signed)
Addended by: Kathrene Bongo on: 12/06/2017 11:12 AM   Modules accepted: Orders

## 2017-12-06 NOTE — Progress Notes (Signed)
Subjective:    Patient ID: Monica Scott, female    DOB: 12-Jan-1956, 62 y.o.   MRN: [id number]  HPI 62 year old female who was recently seen here in the office on 10/20/2017 by another provider status post recent hospitalization from 7/22 through 10/18/2017 for COPD exacerbation.  Patient's hospital discharge diagnoses include COPD exacerbation, acute respiratory failure with hypoxia, hypokalemia, near-syncope, hyperlipidemia, GERD, bladder disease or syndrome and depression.  Patient states that she feels better status post hospitalization but still has some shortness of breath/mild anterior sensation of chest tightness and she has not yet gotten a refill of her Dulera.  Patient also with history of hypertension.  Patient states that she was previously on lisinopril HCTZ but the HCTZ was discontinued as her potassium was low.  Patient is now on lisinopril as well as amlodipine and patient does not believe that the amlodipine works for her as well as the hydrochlorothiazide.  Patient reports that she needs refills of all of her medications at today's visit.      Patient also reports history of bladder dysfunction prior to having a hysterectomy but after hysterectomy, patient now with urinary retention/loss of bladder function and patient perform self caths.  Patient reports that she has had a recent increase in odor to the urine and suspect she may have a urinary tract infection and she would like to have her urine checked at today's visit.  Patient denies any headaches or dizziness related to her blood pressure.  Patient also reports history of depression but states that this is followed by another provider and patient states that her depression is stable at this time.  Patient with history of hyperlipidemia.  Patient is not fasting at today's visit.  Patient is not currently on medication for treatment of her hyperlipidemia.  Patient is taking medication for acid reflux that she has a history of a peptic  ulcer.  Patient denies any current abdominal pain.  No blood in stool and no constipation.  Patient reports that she has not had a colonoscopy but would like to be scheduled for one as her younger brother who is 35 recently passed away from colon cancer.  Patient states her family history is also significant for a brother and sister who have died secondary to COPD.  Patient believes that her mother had leukemia.  Patient has also had a paternal grandfather and paternal aunt who have had strokes.      Patient reports that she is currently employed as a Scientist, water quality at [employer].  Patient states that she stopped smoking approximately 7 years ago but previous to that time smoked about 1-1/2 packs/day of cigarettes.  Patient is currently single and has 4 children.  Patient reports that she also has issues with restless leg syndrome for which she was put on Neurontin which she states has helped and patient would like refill of this medication also at today's visit.  Patient would also like to have her flu shot today.  Patient also reports that she has not had a recent mammogram and she would like to have this scheduled as well. Past Medical History:  Diagnosis Date  . Asthma   . Bladder disease   . COPD (chronic obstructive pulmonary disease) ([place])   . Depression   . Hyperlipidemia   . Hypertension   . Peptic ulcer    Past Surgical History:  Procedure Laterality Date  . ABDOMINAL HYSTERECTOMY     Family History  Problem Relation Age of Onset  .  Alcohol abuse Mother   . Arthritis Mother   . Cancer Mother   . Depression Mother   . Early death Mother   . Alcohol abuse Father   . Depression Father   . Alcohol abuse Sister   . Depression Sister   . Alcohol abuse Brother   . Alcohol abuse Daughter   . Depression Daughter   . Alcohol abuse Son   . COPD Maternal Grandfather   . Heart disease Maternal Grandfather   . Hypertension Maternal Grandfather    Social History   Tobacco Use  . Smoking  status: Former Research scientist (life sciences)  . Smokeless tobacco: Never Used  Substance Use Topics  . Alcohol use: No    Comment: Former ETOH  . Drug use: No  No Known Allergies   Review of Systems  Constitutional: Positive for fatigue (Occasional). Negative for chills and fever.  HENT: Negative for sore throat and trouble swallowing.   Respiratory: Positive for shortness of breath. Negative for cough and wheezing.   Cardiovascular: Negative for chest pain, palpitations and leg swelling.  Gastrointestinal: Negative for abdominal pain and blood in stool.  Genitourinary: Negative for dysuria and frequency.  Musculoskeletal: Negative for back pain and gait problem.  Neurological: Negative for dizziness and headaches.  Psychiatric/Behavioral: Positive for sleep disturbance (Improved with use of trazodone). Negative for suicidal ideas.      Objective:   Physical Exam BP (!) 151/94   Pulse 73   Temp 98.5 F (36.9 C) (Oral)   Resp 20   Ht 4\' 11"  (1.499 m)   Wt 160 lb 12.8 oz (72.9 kg)   SpO2 93%   BMI 32.48 kg/m  Nurses note and vital signs reviewed General- well-nourished, well-developed, short statured older female in no acute distress ENT- TMs gray, nares with mild edema, normal oropharynx Neck-supple, no lymphadenopathy, no thyromegaly, no carotid bruit Lungs-clear to auscultation bilaterally, no wheeze, no increased work of breathing Cardiovascular-regular rate and rhythm Abdomen-soft, nontender Back-no CVA tenderness Extremities-no edema Psychologic-normal mood and judgment     Assessment & Plan:  1. Chronic obstructive pulmonary disease, unspecified COPD type ([place]) Patient with history of tobacco use and has diagnosis of COPD.  Patient states that she has some mild shortness of breath today she has not taken her Ruthe Mannan which she is out of.  Refills provided of patient's respiratory medications including the [facility].  Compliance with medications was stressed at today's visit.  Patient will also  receive influenza immunization. (pharmacy staff will provide and document influenza immunization) - albuterol (PROVENTIL HFA;VENTOLIN HFA) 108 (90 Base) MCG/ACT inhaler; Inhale 2 puffs into the lungs every 4 (four) hours as needed for wheezing or shortness of breath.  Dispense: 1 Inhaler; Refill: 2 - albuterol (PROVENTIL) (2.5 MG/3ML) 0.083% nebulizer solution; Take 3 mLs (2.5 mg total) by nebulization every 2 (two) hours as needed for wheezing or shortness of breath.  Dispense: 75 mL; Refill: 12 - mometasone-formoterol (DULERA) 100-5 MCG/ACT AERO; Inhale 2 puffs into the lungs 2 (two) times daily.  Dispense: 8.8 g; Refill: 11  2. Hypertension, essential, benign Patient's blood pressure is elevated at today's visit.  Patient states that she did take her blood pressure medication last night.  Patient is provided with refill of amlodipine and as her blood pressure is elevated, lisinopril will be increased to 20 mg twice daily from her current once daily dose.  Patient was asked to return to clinic in the next 2 to 3 weeks to have blood pressure rechecked by nursing  or clinical pharmacist. - amLODipine (NORVASC) 5 MG tablet; Take 1 tablet (5 mg total) by mouth daily.  Dispense: 90 tablet; Refill: 1 - lisinopril (PRINIVIL,ZESTRIL) 20 MG tablet; Take 1 tablet (20 mg total) by mouth 2 (two) times daily.  Dispense: 60 tablet; Refill: 6 - Comprehensive metabolic panel  3. Hyperlipidemia, unspecified hyperlipidemia type Patient with hyperlipidemia per medical records.  I did not see a recent lipid panel on review of patient's records.  Patient is not fasting at today's visit.  Will obtain lipid panel at her next visit.  Patient is encouraged to follow a low-fat diet and exercise on a regular basis.  4. Urinary retention Patient reports history of urinary retention requiring self-catheterization.  Patient with complaint of increased odor to the urine and is suspicious that she may have a urinary tract infection  therefore urinalysis will be obtained. - POCT URINALYSIS DIP (CLINITEK)  5. Acute cystitis without hematuria Patient with presence of leukocytes on urinalysis.  Patient will be treated with Macrobid. - nitrofurantoin, macrocrystal-monohydrate, (MACROBID) 100 MG capsule; Take 1 capsule (100 mg total) by mouth 2 (two) times daily for 7 days.  Dispense: 14 capsule; Refill: 0  6.  Encounter for long-term (current) use of medications Patient will have a CMP and CBC done in follow-up of long-term use of medications.  Patient also had elevated white blood cell count during recent hospitalization.  Patient also with history of hypokalemia. - Comprehensive metabolic panel - CBC with Differential  7. Prediabetes Patient with hemoglobin A1c done during her recent hospitalization.  Hemoglobin A1c was elevated at 6.1 consistent with prediabetes.  Patient is on Zyprexa which increases her risk for becoming diabetic.  Patient did receive counseling at her recent visit here regarding low carbohydrate diet and regular exercise to help prevent onset of diabetes.  Patient will have CMP at today's visit. - Comprehensive metabolic panel  8. Restless leg syndrome Patient reports history of restless leg syndrome for which she is currently on Neurontin with good results.  Patient was provided with refill at today's visit. - gabapentin (NEURONTIN) 400 MG capsule; Take 1 capsule (400 mg total) by mouth 3 (three) times daily.  Dispense: 90 capsule; Refill: 11  9. Gastroesophageal reflux disease, esophagitis presence not specified Patient with controlled acid reflux as well as history of a peptic ulcer.  Patient provided with refill of pantoprazole.  Patient will also have CBC at today's visit. - pantoprazole (PROTONIX) 40 MG tablet; Take 1 tablet (40 mg total) by mouth daily.  Dispense: 30 tablet; Refill: 6  10. Screening for breast cancer Patient will be referred for screening mammogram as she states that she has not  had a recent one done - MM Digital Screening; Future  11. Screening for colon cancer Patient with no prior colonoscopy and reports that her 74 year old brother recently passed away from colon cancer.  Patient will be referred for colonoscopy. - Ambulatory referral to Gastroenterology  An After Visit Summary was printed and given to the patient.  Return in about 4 months (around 04/07/2018) for 2-3 week BP check; 4 months.

## 2017-12-06 NOTE — Progress Notes (Signed)
Flu shot yes Pain 0 [facility]  Patient requested for a urine, patient states she may have a bladder infection.

## 2017-12-07 LAB — CBC WITH DIFFERENTIAL/PLATELET
Basophils Absolute: 0.1 x10E3/uL (ref 0.0–0.2)
Basos: 1 %
EOS (ABSOLUTE): 0.2 x10E3/uL (ref 0.0–0.4)
Eos: 4 %
Hematocrit: 38.2 % (ref 34.0–46.6)
Hemoglobin: 12.6 g/dL (ref 11.1–15.9)
Immature Grans (Abs): 0 x10E3/uL (ref 0.0–0.1)
Immature Granulocytes: 0 %
Lymphocytes Absolute: 2.4 x10E3/uL (ref 0.7–3.1)
Lymphs: 34 %
MCH: 28.8 pg (ref 26.6–33.0)
MCHC: 33 g/dL (ref 31.5–35.7)
MCV: 87 fL (ref 79–97)
Monocytes Absolute: 0.5 x10E3/uL (ref 0.1–0.9)
Monocytes: 8 %
Neutrophils Absolute: 3.7 x10E3/uL (ref 1.4–7.0)
Neutrophils: 53 %
Platelets: 398 x10E3/uL (ref 150–450)
RBC: 4.38 x10E6/uL (ref 3.77–5.28)
RDW: 15.9 % — ABNORMAL HIGH (ref 12.3–15.4)
WBC: 6.9 x10E3/uL (ref 3.4–10.8)

## 2017-12-07 LAB — COMPREHENSIVE METABOLIC PANEL WITH GFR
ALT: 12 IU/L (ref 0–32)
AST: 14 IU/L (ref 0–40)
Albumin/Globulin Ratio: 2.1 (ref 1.2–2.2)
Albumin: 4.2 g/dL (ref 3.6–4.8)
Alkaline Phosphatase: 128 IU/L — ABNORMAL HIGH (ref 39–117)
BUN/Creatinine Ratio: 10 — ABNORMAL LOW (ref 12–28)
BUN: 7 mg/dL — ABNORMAL LOW (ref 8–27)
Bilirubin Total: 0.2 mg/dL (ref 0.0–1.2)
CO2: 25 mmol/L (ref 20–29)
Calcium: 9.3 mg/dL (ref 8.7–10.3)
Chloride: 100 mmol/L (ref 96–106)
Creatinine, Ser: 0.72 mg/dL (ref 0.57–1.00)
GFR calc Af Amer: 104 mL/min/1.73
GFR calc non Af Amer: 90 mL/min/1.73
Globulin, Total: 2 g/dL (ref 1.5–4.5)
Glucose: 80 mg/dL (ref 65–99)
Potassium: 4.2 mmol/L (ref 3.5–5.2)
Sodium: 143 mmol/L (ref 134–144)
Total Protein: 6.2 g/dL (ref 6.0–8.5)

## 2017-12-14 ENCOUNTER — Telehealth: Payer: Self-pay

## 2017-12-14 NOTE — Telephone Encounter (Signed)
-----   Message from [name], MD sent at 12/07/2017  3:26 PM EDT ----- Patient CMP is near normal.  Patient does have just a mild increase in alkaline phosphatase which is a nonspecific liver enzyme.  Patient's level is 128 and normal is 39-117.  Alkaline phosphatase can also be found in the bone as well as the liver and salivary glands.  Patient CBC was normal

## 2017-12-14 NOTE — Telephone Encounter (Signed)
Patient was called, no answer, lvm to return call. 

## 2017-12-15 NOTE — Telephone Encounter (Signed)
Patient called requesting lab results. Patient verified DOB and  was informed and verbalized understanding. No further questions.

## 2017-12-20 ENCOUNTER — Ambulatory Visit: Payer: Self-pay | Attending: Family Medicine | Admitting: Pharmacist

## 2017-12-20 ENCOUNTER — Encounter: Payer: Self-pay | Admitting: Pharmacist

## 2017-12-20 VITALS — BP 126/81 | HR 75

## 2017-12-20 DIAGNOSIS — Z87891 Personal history of nicotine dependence: Secondary | ICD-10-CM | POA: Insufficient documentation

## 2017-12-20 DIAGNOSIS — I10 Essential (primary) hypertension: Secondary | ICD-10-CM | POA: Insufficient documentation

## 2017-12-20 NOTE — Patient Instructions (Addendum)
Thank you for coming to see [place] today.   Blood pressure today is well-controlled!  Continue taking lisinopril 20 mg 2 times a day with amlodipine 5 mg once daily.   Limiting salt and caffeine, as well as exercising as able for at least 30 minutes for 5 days out of the week, can also help you lower your blood pressure.  Take your blood pressure at home if you are able. Please write down these numbers and bring them to your visits.  If you have any questions about medications, please call me [phone].  Monica Scott

## 2017-12-20 NOTE — Progress Notes (Signed)
   S:    PCP: Dr. Chapman Fitch  Patient arrives in good spirits. Presents to the clinic for hypertension management. Patient was referred and last seen by Dr. Chapman Fitch on 12/06/17. BP 151/94 at that visit. Amlodipine continued and lisinopril increased.   Today, she denies CP, SOB, HA, or blurred vision. Denies adverse effects with medications.     Patient denies adherence with medications. Reports missing 1-2 doses over the past week.   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, research (medical) - Nothing apart from the above/COPD limits activity Family / Social history:  - FH: Alcohol abuse (mother, father) - Tobacco: previous smoker; stopped ~2012 - Alcohol: denies use   Home BP readings:  - Has cuff, does not take at home   O:  L arm after 5 minutes rest: 126/81, HR 75  Last 3 Office BP readings: BP Readings from Last 3 Encounters:  12/06/17 (!) 151/94  10/20/17 139/87  10/18/17 122/70   BMET    Component Value Date/Time   NA 143 12/06/2017 1035   K 4.2 12/06/2017 1035   CL 100 12/06/2017 1035   CO2 25 12/06/2017 1035   GLUCOSE 80 12/06/2017 1035   GLUCOSE 141 (H) 10/18/2017 0602   BUN 7 (L) 12/06/2017 1035   CREATININE 0.72 12/06/2017 1035   CALCIUM 9.3 12/06/2017 1035   GFRNONAA 90 12/06/2017 1035   GFRAA 104 12/06/2017 1035    Renal function: Estimated Creatinine Clearance: 63.4 mL/min (by C-G formula based on SCr of 0.72 mg/dL).  A/P: Hypertension longstanding diagnosed currently controlled on current medications. BP Goal <130/80 mmHg. Patient is not adherent with current medications.  -Continued anti-hypertensives.  -Counseled on lifestyle modifications for blood pressure control  including reduced dietary sodium, increased exercise, adequate sleep  Results reviewed and written information provided. Total time in face-to-face counseling 15 minutes.   F/U Clinic Visit 04/07/2018.  Patient seen with: Marylene Buerger, PharmD Candidate [employer]  Benard Halsted, PharmD, [place] [phone]

## 2018-01-24 MED FILL — GABAPENTIN 400 MG CAPSULE: 400 | 30 days supply | Qty: 90 | Fill #1

## 2018-01-24 MED FILL — $Dulera 100 mcg Inhaler: 100-5 | 90 days supply | Qty: 39 | Fill #0

## 2018-01-29 ENCOUNTER — Emergency Department (HOSPITAL_BASED_OUTPATIENT_CLINIC_OR_DEPARTMENT_OTHER)
Admission: EM | Admit: 2018-01-29 | Discharge: 2018-01-29 | Disposition: A | Discharge status: Home / Self Care | Payer: Self-pay | Attending: Emergency Medicine | Admitting: Emergency Medicine

## 2018-01-29 ENCOUNTER — Encounter (HOSPITAL_BASED_OUTPATIENT_CLINIC_OR_DEPARTMENT_OTHER): Payer: Self-pay | Admitting: Emergency Medicine

## 2018-01-29 ENCOUNTER — Emergency Department (HOSPITAL_BASED_OUTPATIENT_CLINIC_OR_DEPARTMENT_OTHER): Payer: Self-pay

## 2018-01-29 ENCOUNTER — Other Ambulatory Visit: Payer: Self-pay

## 2018-01-29 DIAGNOSIS — I10 Essential (primary) hypertension: Secondary | ICD-10-CM | POA: Insufficient documentation

## 2018-01-29 DIAGNOSIS — E785 Hyperlipidemia, unspecified: Secondary | ICD-10-CM | POA: Insufficient documentation

## 2018-01-29 DIAGNOSIS — J441 Chronic obstructive pulmonary disease with (acute) exacerbation: Secondary | ICD-10-CM | POA: Insufficient documentation

## 2018-01-29 DIAGNOSIS — Z87891 Personal history of nicotine dependence: Secondary | ICD-10-CM | POA: Insufficient documentation

## 2018-01-29 DIAGNOSIS — Z79899 Other long term (current) drug therapy: Secondary | ICD-10-CM | POA: Insufficient documentation

## 2018-01-29 MED ORDER — PREDNISONE 10 MG PO TABS: ORAL_TABLET | ORAL | 0 refills | Status: DC

## 2018-01-29 MED ORDER — IPRATROPIUM-ALBUTEROL 0.5-2.5 (3) MG/3ML IN SOLN
RESPIRATORY_TRACT | Status: AC
  Administered 2018-01-29: 3 mL
  Filled 2018-01-29: qty 3

## 2018-01-29 MED ORDER — PREDNISONE 50 MG PO TABS
60.0000 mg | ORAL_TABLET | Freq: Once | ORAL | Status: AC
  Administered 2018-01-29: 60 mg via ORAL
  Filled 2018-01-29: qty 1

## 2018-01-29 MED ORDER — ALBUTEROL SULFATE (2.5 MG/3ML) 0.083% IN NEBU
INHALATION_SOLUTION | RESPIRATORY_TRACT | Status: AC
  Administered 2018-01-29: 2.5 mg
  Filled 2018-01-29: qty 3

## 2018-01-29 MED ORDER — IPRATROPIUM-ALBUTEROL 0.5-2.5 (3) MG/3ML IN SOLN
3.0000 mL | Freq: Once | RESPIRATORY_TRACT | Status: AC
  Administered 2018-01-29: 3 mL via RESPIRATORY_TRACT

## 2018-01-29 MED ORDER — IPRATROPIUM-ALBUTEROL 0.5-2.5 (3) MG/3ML IN SOLN
RESPIRATORY_TRACT | Status: AC
  Administered 2018-01-29: 3 mL via RESPIRATORY_TRACT
  Filled 2018-01-29: qty 3

## 2018-01-29 MED ORDER — DOXYCYCLINE HYCLATE 100 MG PO CAPS: 100.0000 mg | ORAL_CAPSULE | Freq: Two times a day (BID) | ORAL | 0 refills | Status: DC

## 2018-01-29 NOTE — ED Provider Notes (Addendum)
[facility] EMERGENCY DEPARTMENT Provider Note   CSN: [id number] Arrival date & time: 01/29/18  9381     History   Chief Complaint Chief Complaint  Patient presents with  . Shortness of Breath    HPI Monica Scott is a 62 y.o. female.  HPI  62 year old female with history of COPD comes in with chief complaint of shortness of breath.  Patient has been feeling worsening shortness of breath for the past 1 week.  She has a cough that is producing clear phlegm.  Patient does not smoke anymore.  She reports that she has been having cold-like symptoms, but no fevers, chills.  Patient does not have any chest pain and has no known cardiac problems.  Patient has given herself for breathing treatments daily over the past couple of days with transient relief.  Normally she does not have to take more than 1 rescue inhaler treatment.  Past Medical History:  Diagnosis Date  . Asthma   . Bladder disease   . COPD (chronic obstructive pulmonary disease) ([place])   . Depression   . Hyperlipidemia   . Hypertension   . Peptic ulcer     Patient Active Problem List   Diagnosis Date Noted  . Acute respiratory failure with hypoxia ([place]) 10/17/2017  . Hypokalemia 10/17/2017  . Near syncope 10/17/2017  . Hyperlipidemia 10/17/2017  . GERD (gastroesophageal reflux disease) 10/17/2017  . Bladder disease or syndrome 10/17/2017  . Depression 10/17/2017  . Hypertension, essential, benign 02/19/2017  . COPD (chronic obstructive pulmonary disease) ([place]) 02/11/2017    Past Surgical History:  Procedure Laterality Date  . ABDOMINAL HYSTERECTOMY       OB History   None      Home Medications    Prior to Admission medications   Medication Sig Start Date End Date Taking? Authorizing Provider  albuterol (PROVENTIL HFA;VENTOLIN HFA) 108 (90 Base) MCG/ACT inhaler Inhale 2 puffs into the lungs every 4 (four) hours as needed for wheezing or shortness of breath. 12/06/17   Fulp, Cammie, MD   albuterol (PROVENTIL) (2.5 MG/3ML) 0.083% nebulizer solution Take 3 mLs (2.5 mg total) by nebulization every 4 (four) hours as needed for wheezing or shortness of breath. 12/06/17   Fulp, Cammie, MD  amLODipine (NORVASC) 5 MG tablet Take 1 tablet (5 mg total) by mouth daily. 12/06/17   Fulp, Cammie, MD  doxycycline (VIBRAMYCIN) 100 MG capsule Take 1 capsule (100 mg total) by mouth 2 (two) times daily. 01/29/18   Varney Biles, MD  Fluoxetine HCl, PMDD, 20 MG TABS Take 20 mg by mouth daily. 12/21/14   [provider]  gabapentin (NEURONTIN) 400 MG capsule Take 1 capsule (400 mg total) by mouth 3 (three) times daily. 12/06/17   Fulp, Cammie, MD  ibuprofen (ADVIL,MOTRIN) 600 MG tablet Take 1 tablet (600 mg total) by mouth every 6 (six) hours as needed for moderate pain. 03/14/17   Duffy Bruce, MD  lamoTRIgine (LAMICTAL) 25 MG tablet Take 25 mg by mouth daily.    [provider]  lisinopril (PRINIVIL,ZESTRIL) 20 MG tablet Take 1 tablet (20 mg total) by mouth 2 (two) times daily. 12/06/17   Fulp, Cammie, MD  mometasone-formoterol (DULERA) 100-5 MCG/ACT AERO Inhale 2 puffs into the lungs 2 (two) times daily. 12/06/17   Fulp, Cammie, MD  OLANZapine (ZYPREXA) 5 MG tablet Take 5 mg by mouth at bedtime.    [provider]  pantoprazole (PROTONIX) 40 MG tablet Take 1 tablet (40 mg total) by mouth  daily. 12/06/17   Fulp, Cammie, MD  predniSONE (DELTASONE) 10 MG tablet Take 40 mg oral daily for 3 days, then 30 mg oral daily for 3 days, then 20 mg oral daily for 3 days, then 10 mg oral daily for 3 days, then stop. 01/29/18   Varney Biles, MD  traZODone (DESYREL) 100 MG tablet Take 100 mg by mouth at bedtime as needed for sleep.     [provider]    Family History Family History  Problem Relation Age of Onset  . Alcohol abuse Mother   . Arthritis Mother   . Cancer Mother   . Depression Mother   . Early death Mother   . Alcohol abuse Father   . Depression Father   .  Alcohol abuse Sister   . Depression Sister   . Alcohol abuse Brother   . Alcohol abuse Daughter   . Depression Daughter   . Alcohol abuse Son   . COPD Maternal Grandfather   . Heart disease Maternal Grandfather   . Hypertension Maternal Grandfather     Social History Social History   Tobacco Use  . Smoking status: Former Research scientist (life sciences)  . Smokeless tobacco: Never Used  Substance Use Topics  . Alcohol use: No    Comment: Former ETOH  . Drug use: No     Allergies   Patient has no known allergies.   Review of Systems Review of Systems  Constitutional: Positive for activity change.  Respiratory: Positive for cough and shortness of breath.   Cardiovascular: Negative for chest pain.  Skin: Negative for rash.  Allergic/Immunologic: Negative for immunocompromised state.  All other systems reviewed and are negative.    Physical Exam Updated Vital Signs BP (!) 146/86 (BP Location: Right Arm)   Pulse 75   Temp 98.3 F (36.8 C) (Oral)   Resp (!) 21   Ht 4\' 11"  (1.499 m)   Wt 72.6 kg   SpO2 91%   BMI 32.32 kg/m   Physical Exam  Constitutional: She is oriented to person, place, and time. She appears well-developed.  HENT:  Head: Normocephalic and atraumatic.  Eyes: EOM are normal.  Neck: Normal range of motion. Neck supple.  Cardiovascular: Normal rate.  Pulmonary/Chest: Effort normal. No respiratory distress. She has wheezes in the right upper field, the right middle field, the right lower field, the left upper field, the left middle field and the left lower field.  Abdominal: Bowel sounds are normal.  Musculoskeletal:       Right lower leg: She exhibits no edema.       Left lower leg: She exhibits no edema.  Neurological: She is alert and oriented to person, place, and time.  Skin: Skin is warm and dry.  Nursing note and vitals reviewed.    ED Treatments / Results  Labs (all labs ordered are listed, but only abnormal results are displayed) Labs Reviewed - No data  to display  EKG EKG Interpretation  Date/Time:  Sunday January 29 2018 07:05:41 EST Ventricular Rate:  71 PR Interval:    QRS Duration: 95 QT Interval:  406 QTC Calculation: 442 R Axis:   79 Text Interpretation:  Sinus rhythm Ventricular premature complex No acute changes Confirmed by Varney Biles [phone]) on 01/29/2018 7:28:50 AM   Radiology Dg Chest 2 View  Result Date: 01/29/2018 CLINICAL DATA:  Pt with hx COPD having cough and congestion x 1 week, denies pain Former smokerSOB EXAM: CHEST - 2 VIEW COMPARISON:  Radiograph 10/18/2017 FINDINGS: Normal  cardiac silhouette. Persistent LEFT basilar atelectasis. Nodular density at the RIGHT lung base measuring 17 mm. No pulmonary edema. No pneumothorax. No acute osseous abnormality. IMPRESSION: 1. Nodular density in the RIGHT lung base could represent pulmonary nodule or focus of infection or atelectasis. Recommend CT thorax for evaluation versus follow-up chest radiograph. 2. Chronic LEFT lower lobe atelectasis. Electronically Signed   By: Suzy Bouchard M.D.   On: 01/29/2018 07:57    Procedures .Critical Care Performed by: Varney Biles, MD Authorized by: Varney Biles, MD   Critical care provider statement:    Critical care time (minutes):  32   Critical care start time:  01/29/2018 7:36 AM   Critical care end time:  01/29/2018 8:36 AM   Critical care was time spent personally by me on the following activities:  Evaluation of patient's response to treatment, examination of patient, ordering and performing treatments and interventions, ordering and review of radiographic studies, pulse oximetry, re-evaluation of patient's condition, obtaining history from patient or surrogate and review of old charts   (including critical care time)    Medications Ordered in ED Medications  albuterol (PROVENTIL) (2.5 MG/3ML) 0.083% nebulizer solution (2.5 mg  Given 01/29/18 0709)  ipratropium-albuterol (DUONEB) 0.5-2.5 (3) MG/3ML nebulizer  solution (3 mLs  Given 01/29/18 0709)  ipratropium-albuterol (DUONEB) 0.5-2.5 (3) MG/3ML nebulizer solution 3 mL (3 mLs Nebulization Given 01/29/18 0728)  predniSONE (DELTASONE) tablet 60 mg (60 mg Oral Given 01/29/18 0736)     Initial Impression / Assessment and Plan / ED Course  I have reviewed the triage vital signs and the nursing notes.  Pertinent labs & imaging results that were available during my care of the patient were reviewed by me and considered in my medical decision making (see chart for details).  Clinical Course as of Jan 29 834  Sun Jan 29, 2018  0835 Patient reassessed.  She feels a lot better and her wheezing is improved. O2 sats noted to be in the low 90s.  Patient does use oxygen as needed.  At this time patient is comfortable going home.  Results of the x-ray reviewed and based on that we will start her on doxycycline.  Patient has been advised to follow-up with her PCP in 1 week and she will return to the ER if her symptoms start getting worse.   [AN]    Clinical Course User Index [AN] Varney Biles, MD    62 year old female comes in with chief complaint of shortness of breath and nonproductive cough with cold-like symptoms.  Symptoms have been present for a few days now.  She does not have any flulike illness on history and is noted to have diffuse wheezing.  It seems like patient has COPD exacerbation.  We will give her some nebulizer treatment and reassess.  Prednisone to be given.  Anticipate discharge.  Final Clinical Impressions(s) / ED Diagnoses   Final diagnoses:  COPD with acute exacerbation [facility])    ED Discharge Orders         Ordered    predniSONE (DELTASONE) 10 MG tablet     01/29/18 0834    doxycycline (VIBRAMYCIN) 100 MG capsule  2 times daily     01/29/18 0834           Varney Biles, MD 01/29/18 2376    Varney Biles, MD 01/29/18 [phone]

## 2018-01-29 NOTE — Discharge Instructions (Signed)
We saw you in the ER for your breathing related complains. We gave you some breathing treatments in the ER, and seems like your symptoms have improved. °Please take albuterol as needed every 4 hours. °Please take the medications prescribed. °Please refrain from smoking or smoke exposure. °Please see a primary care doctor in 1 week. °Return to the ER if your symptoms worsen. ° °

## 2018-01-29 NOTE — ED Triage Notes (Signed)
SOB x 1 week, worse with exertion, denies pain. Productive cough

## 2018-03-01 ENCOUNTER — Encounter: Payer: Self-pay | Admitting: *Deleted

## 2018-03-01 ENCOUNTER — Other Ambulatory Visit: Payer: Self-pay | Admitting: *Deleted

## 2018-03-01 ENCOUNTER — Encounter: Payer: Self-pay | Admitting: Gastroenterology

## 2018-03-01 ENCOUNTER — Telehealth: Payer: Self-pay | Admitting: *Deleted

## 2018-03-01 ENCOUNTER — Ambulatory Visit (INDEPENDENT_AMBULATORY_CARE_PROVIDER_SITE_OTHER): Payer: Self-pay | Admitting: Gastroenterology

## 2018-03-01 VITALS — BP 125/75 | HR 90 | Temp 98.3°F | Ht 59.0 in | Wt 164.8 lb

## 2018-03-01 DIAGNOSIS — K219 Gastro-esophageal reflux disease without esophagitis: Secondary | ICD-10-CM

## 2018-03-01 DIAGNOSIS — R1319 Other dysphagia: Secondary | ICD-10-CM | POA: Insufficient documentation

## 2018-03-01 DIAGNOSIS — R1310 Dysphagia, unspecified: Secondary | ICD-10-CM

## 2018-03-01 DIAGNOSIS — R197 Diarrhea, unspecified: Secondary | ICD-10-CM

## 2018-03-01 DIAGNOSIS — Z800 Family history of malignant neoplasm of digestive organs: Secondary | ICD-10-CM

## 2018-03-01 NOTE — Telephone Encounter (Signed)
Pre-op scheduled for 04/19/18 at 9:00am. Letter mailed. LMOVM.

## 2018-03-01 NOTE — Progress Notes (Signed)
Primary Care Physician:  [name], MD  Primary Gastroenterologist:  Garfield Cornea, MD   Chief Complaint  Patient presents with  . Colonoscopy    never had tcs  . Diarrhea    HPI:  Monica Scott is a 62 y.o. female here to schedule first ever colonoscopy at the request of Dr. Chapman Fitch.  Patient reports brother recently deceased secondary to metastatic colon cancer at age of 19.  Patient reports chronic intermittent fecal urgency sometimes associated with incontinence.  Typically 3 stools daily on those days.  And may go a day or 2 without a bowel movement.   Symptoms worse over the past 6 months. No nocturnal symptoms.  No melena or rectal bleeding.  Reports peptic ulcer disease diagnosed about 10 years ago via EGD.  Typical heartburn well controlled on PPI.  Complains of dysphagia to solid foods.  Symptoms worsening over the past few months.  Difficulty swallowing mac & cheese with ground beef.  No pill dysphagia.  No weight loss. No n/v.   Continues to work, currently as a Scientist, water quality.  Has gained 20 pounds in the past year.  remote alcohol abuse, none in the past 9 years.  Hospitalized back in July with COPD exacerbation.  Current Outpatient Medications  Medication Sig Dispense Refill  . albuterol (PROVENTIL HFA;VENTOLIN HFA) 108 (90 Base) MCG/ACT inhaler Inhale 2 puffs into the lungs every 4 (four) hours as needed for wheezing or shortness of breath. 1 Inhaler 2  . albuterol (PROVENTIL) (2.5 MG/3ML) 0.083% nebulizer solution Take 3 mLs (2.5 mg total) by nebulization every 4 (four) hours as needed for wheezing or shortness of breath. 75 mL 12  . amLODipine (NORVASC) 5 MG tablet Take 1 tablet (5 mg total) by mouth daily. 90 tablet 1  . Fluoxetine HCl, PMDD, 20 MG TABS Take 20 mg by mouth daily.    Marland Kitchen gabapentin (NEURONTIN) 400 MG capsule Take 1 capsule (400 mg total) by mouth 3 (three) times daily. 90 capsule 11  . lamoTRIgine (LAMICTAL) 25 MG tablet Take 25 mg by mouth daily.    Marland Kitchen  lisinopril (PRINIVIL,ZESTRIL) 20 MG tablet Take 1 tablet (20 mg total) by mouth 2 (two) times daily. 60 tablet 6  . mometasone-formoterol (DULERA) 100-5 MCG/ACT AERO Inhale 2 puffs into the lungs 2 (two) times daily. 8.8 g 11  . OLANZapine (ZYPREXA) 5 MG tablet Take 5 mg by mouth at bedtime.    . pantoprazole (PROTONIX) 40 MG tablet Take 1 tablet (40 mg total) by mouth daily. 30 tablet 6  . traZODone (DESYREL) 100 MG tablet Take 100 mg by mouth at bedtime as needed for sleep.      No current facility-administered medications for this visit.     Allergies as of 03/01/2018  . (No Known Allergies)    Past Medical History:  Diagnosis Date  . Asthma   . Bladder disease   . COPD (chronic obstructive pulmonary disease) ([place])   . Depression   . Hyperlipidemia   . Hypertension   . Peptic ulcer     Past Surgical History:  Procedure Laterality Date  . ABDOMINAL HYSTERECTOMY      Family History  Problem Relation Age of Onset  . Alcohol abuse Mother   . Arthritis Mother   . Cancer Mother   . Depression Mother   . Early death Mother   . Alcohol abuse Father   . Depression Father   . Alcohol abuse Sister   . Depression Sister   .  Alcohol abuse Brother   . Alcohol abuse Daughter   . Depression Daughter   . Alcohol abuse Son   . COPD Maternal Grandfather   . Heart disease Maternal Grandfather   . Hypertension Maternal Grandfather   . Colon cancer Brother        deceased at age 68    Social History   Socioeconomic History  . Marital status: Single    Spouse name: Not on file  . Number of children: Not on file  . Years of education: Not on file  . Highest education level: Not on file  Occupational History  . Not on file  Social Needs  . Financial resource strain: Not on file  . Food insecurity:    Worry: Not on file    Inability: Not on file  . Transportation needs:    Medical: Not on file    Non-medical: Not on file  Tobacco Use  . Smoking status: Former Research scientist (life sciences)  .  Smokeless tobacco: Never Used  Substance and Sexual Activity  . Alcohol use: No    Comment: Former ETOH. none since 2000  . Drug use: No  . Sexual activity: Not on file  Lifestyle  . Physical activity:    Days per week: Not on file    Minutes per session: Not on file  . Stress: Not on file  Relationships  . Social connections:    Talks on phone: Not on file    Gets together: Not on file    Attends religious service: Not on file    Active member of club or organization: Not on file    Attends meetings of clubs or organizations: Not on file    Relationship status: Not on file  . Intimate partner violence:    Fear of current or ex partner: Not on file    Emotionally abused: Not on file    Physically abused: Not on file    Forced sexual activity: Not on file  Other Topics Concern  . Not on file  Social History Narrative  . Not on file      ROS:  General: Negative for anorexia, weight loss, fever, chills, fatigue, weakness. Eyes: Negative for vision changes.  ENT: Negative for  nasal congestion.c/o hoarseness. See hpi CV: Negative for chest pain, angina, palpitations, dyspnea on exertion, peripheral edema.  Respiratory: Negative for dyspnea at rest, +dyspnea on exertion, cough, sputum, wheezing.  GI: See history of present illness. GU:  Negative for dysuria, hematuria, urinary incontinence, urinary frequency, nocturnal urination.  MS: Negative for joint pain, low back pain.  Derm: Negative for rash or itching.  Neuro: Negative for weakness, abnormal sensation, seizure, frequent headaches, memory loss, confusion.  Psych: Negative for anxiety, depression, suicidal ideation, hallucinations.  Endo: Negative for unusual weight change.  Heme: Negative for bruising or bleeding. Allergy: Negative for rash or hives.    Physical Examination:  BP 125/75   Pulse 90   Temp 98.3 F (36.8 C) (Oral)   Ht _0  (1.499 m)   Wt 164 lb 12.8 oz (74.8 kg)   BMI 33.29 kg/m     General: Well-nourished, well-developed in no acute distress.  Head: Normocephalic, atraumatic.   Eyes: Conjunctiva pink, no icterus. Mouth: Oropharyngeal mucosa moist and pink , no lesions erythema or exudate. Neck: Supple without thyromegaly, masses, or lymphadenopathy.  Lungs: Clear to auscultation bilaterally.  Heart: Regular rate and rhythm, no murmurs rubs or gallops.  Abdomen: Bowel sounds are normal, nontender, nondistended, no  hepatosplenomegaly or masses, no abdominal bruits or    hernia , no rebound or guarding.   Rectal: not performed Extremities: No lower extremity edema. No clubbing or deformities.  Neuro: Alert and oriented x 4 , grossly normal neurologically.  Skin: Warm and dry, no rash or jaundice.   Psych: Alert and cooperative, normal mood and affect.  Labs: Lab Results  Component Value Date   CREATININE 0.72 12/06/2017   BUN 7 (L) 12/06/2017   NA 143 12/06/2017   K 4.2 12/06/2017   CL 100 12/06/2017   CO2 25 12/06/2017   Lab Results  Component Value Date   ALT 12 12/06/2017   AST 14 12/06/2017   ALKPHOS 128 (H) 12/06/2017   BILITOT 0.2 12/06/2017   Lab Results  Component Value Date   WBC 6.9 12/06/2017   HGB 12.6 12/06/2017   HCT 38.2 12/06/2017   MCV 87 12/06/2017   PLT 398 12/06/2017     Imaging Studies: No results found.

## 2018-03-01 NOTE — Patient Instructions (Signed)
1. Please go to [facility] to have your blood work done today. 2. Colonoscopy and upper endoscopy as scheduled.  Please see separate instructions.

## 2018-03-01 NOTE — Assessment & Plan Note (Signed)
Brother presented to the ED with obstructing metastatic colon cancer, quickly succumbed to the disease at age 62.  Patient needs a colonoscopy at this point.  At minimum will require surveillance colonoscopy every 5 years.

## 2018-03-01 NOTE — Assessment & Plan Note (Signed)
Suspect IBS.  We will screen for celiac disease with labs.  Plan for colonoscopy in the near future as well.  Deep sedation given polypharmacy and prior alcohol abuse. CONSIDER RANDOM COLON BIOPSIES IF NEEDED.  I have discussed the risks, alternatives, benefits with regards to but not limited to the risk of reaction to medication, bleeding, infection, perforation and the patient is agreeable to proceed. Written consent to be obtained.

## 2018-03-01 NOTE — Assessment & Plan Note (Signed)
GERD, typical heartburn well controlled.  Reports remote peptic ulcer disease.  Complains of solid food dysphagia getting worse over the past several months.  Recommend EGD with dilation in the near future.  Plan for deep sedation given polypharmacy and previous alcohol use.  I have discussed the risks, alternatives, benefits with regards to but not limited to the risk of reaction to medication, bleeding, infection, perforation and the patient is agreeable to proceed. Written consent to be obtained.

## 2018-03-02 NOTE — Progress Notes (Signed)
cc'd to pcp 

## 2018-03-06 MED FILL — !VENTOLIN HFA INHALER: 108 (90 BAS | 16 days supply | Qty: 18 | Fill #1

## 2018-03-06 MED FILL — ?PANTOPRAZOLE SO DR 40MG TA: 40 | 30 days supply | Qty: 30 | Fill #1

## 2018-03-06 MED FILL — GABAPENTIN 400 MG CAPSULE: 400 | 30 days supply | Qty: 90 | Fill #2

## 2018-03-06 MED FILL — ALBUTEROL SUL 2.5 MG/3 ML S: (2.5 MG/3ML | 20 days supply | Qty: 360 | Fill #1

## 2018-04-03 MED FILL — GABAPENTIN 400 MG CAPSULE: 400 | 30 days supply | Qty: 90 | Fill #3

## 2018-04-07 ENCOUNTER — Encounter: Payer: Self-pay | Admitting: Family Medicine

## 2018-04-07 ENCOUNTER — Ambulatory Visit: Payer: Medicaid Other | Attending: Family Medicine | Admitting: Family Medicine

## 2018-04-07 VITALS — BP 149/85 | HR 73 | Temp 99.4°F | Resp 18 | Ht 59.0 in | Wt 165.0 lb

## 2018-04-07 DIAGNOSIS — J441 Chronic obstructive pulmonary disease with (acute) exacerbation: Secondary | ICD-10-CM | POA: Diagnosis not present

## 2018-04-07 DIAGNOSIS — R918 Other nonspecific abnormal finding of lung field: Secondary | ICD-10-CM | POA: Diagnosis not present

## 2018-04-07 DIAGNOSIS — J189 Pneumonia, unspecified organism: Secondary | ICD-10-CM | POA: Insufficient documentation

## 2018-04-07 DIAGNOSIS — R911 Solitary pulmonary nodule: Secondary | ICD-10-CM | POA: Insufficient documentation

## 2018-04-07 DIAGNOSIS — Z79899 Other long term (current) drug therapy: Secondary | ICD-10-CM | POA: Diagnosis not present

## 2018-04-07 DIAGNOSIS — Z9981 Dependence on supplemental oxygen: Secondary | ICD-10-CM | POA: Diagnosis not present

## 2018-04-07 DIAGNOSIS — J9601 Acute respiratory failure with hypoxia: Secondary | ICD-10-CM | POA: Insufficient documentation

## 2018-04-07 DIAGNOSIS — K279 Peptic ulcer, site unspecified, unspecified as acute or chronic, without hemorrhage or perforation: Secondary | ICD-10-CM | POA: Insufficient documentation

## 2018-04-07 DIAGNOSIS — I10 Essential (primary) hypertension: Secondary | ICD-10-CM | POA: Insufficient documentation

## 2018-04-07 DIAGNOSIS — F329 Major depressive disorder, single episode, unspecified: Secondary | ICD-10-CM | POA: Diagnosis not present

## 2018-04-07 DIAGNOSIS — Z7952 Long term (current) use of systemic steroids: Secondary | ICD-10-CM | POA: Insufficient documentation

## 2018-04-07 MED ORDER — ALBUTEROL SULFATE HFA 108 (90 BASE) MCG/ACT IN AERS: 2.0000 | INHALATION_SPRAY | RESPIRATORY_TRACT | 2 refills | Status: DC | PRN

## 2018-04-07 MED ORDER — IPRATROPIUM-ALBUTEROL 0.5-2.5 (3) MG/3ML IN SOLN
3.0000 mL | Freq: Once | RESPIRATORY_TRACT | Status: AC
  Administered 2018-04-07: 3 mL via RESPIRATORY_TRACT

## 2018-04-07 MED ORDER — METHYLPREDNISOLONE SODIUM SUCC 125 MG IJ SOLR
125.0000 mg | Freq: Once | INTRAMUSCULAR | Status: AC
  Administered 2018-04-07: 125 mg via INTRAMUSCULAR

## 2018-04-07 MED ORDER — PREDNISONE 20 MG PO TABS: 20.0000 mg | ORAL_TABLET | Freq: Two times a day (BID) | ORAL | 0 refills | Status: DC

## 2018-04-07 MED ORDER — LEVOFLOXACIN 500 MG PO TABS: 500.0000 mg | ORAL_TABLET | Freq: Every day | ORAL | 0 refills | Status: DC

## 2018-04-07 MED ORDER — CEFTRIAXONE SODIUM 500 MG IJ SOLR
500.0000 mg | Freq: Once | INTRAMUSCULAR | Status: AC
  Administered 2018-04-07: 500 mg via INTRAMUSCULAR

## 2018-04-07 NOTE — Progress Notes (Signed)
Subjective:  Patient ID: Monica Scott, female    DOB: April 12, 1955  Age: 63 y.o. MRN: [id number]  CC: Follow-up   HPI Monica Scott is a 63 year old female with a history of COPD, previous tobacco abuse, hypertension and depression who presents with a 2-day history of ability to move air through her lungs.  She complains of dyspnea, wheezing, cough productive of whitish sputum but denies fever, nasal congestion. She has used her regular COPD inhalers and also used a nebulizer treatment this morning with no much relief. Her oxygen saturation is 90% on room air and she forgot her oxygen at home as she was in a hurry to get to this appointment. Denies history of sick contacts 2 months ago she had an ED visit for COPD exacerbation and at that time her chest x-ray revealed a right lung nodule which she is wanting checked out.  She has a history of smoking greater than 30 years and quit a couple of years ago.  Past Medical History:  Diagnosis Date  . Asthma   . Bladder disease   . COPD (chronic obstructive pulmonary disease) ([place])   . Depression   . Hyperlipidemia   . Hypertension   . Peptic ulcer     Past Surgical History:  Procedure Laterality Date  . ABDOMINAL HYSTERECTOMY      No Known Allergies   Outpatient Medications Prior to Visit  Medication Sig Dispense Refill  . albuterol (PROVENTIL) (2.5 MG/3ML) 0.083% nebulizer solution Take 3 mLs (2.5 mg total) by nebulization every 4 (four) hours as needed for wheezing or shortness of breath. 75 mL 12  . amLODipine (NORVASC) 5 MG tablet Take 1 tablet (5 mg total) by mouth daily. 90 tablet 1  . Aspirin-Caffeine (BC FAST PAIN RELIEF PO) Take 1 packet by mouth daily as needed (pain).    Marland Kitchen FLUoxetine (PROZAC) 20 MG tablet Take 20 mg by mouth daily.    Marland Kitchen gabapentin (NEURONTIN) 400 MG capsule Take 1 capsule (400 mg total) by mouth 3 (three) times daily. 90 capsule 11  . ibuprofen (ADVIL,MOTRIN) 200 MG tablet Take 400 mg by mouth 3  (three) times daily as needed for moderate pain.    Marland Kitchen lisinopril (PRINIVIL,ZESTRIL) 20 MG tablet Take 1 tablet (20 mg total) by mouth 2 (two) times daily. 60 tablet 6  . loperamide (IMODIUM A-D) 2 MG tablet Take 4 mg by mouth 2 (two) times daily as needed for diarrhea or loose stools.    . mometasone-formoterol (DULERA) 100-5 MCG/ACT AERO Inhale 2 puffs into the lungs 2 (two) times daily. 8.8 g 11  . OLANZapine (ZYPREXA) 5 MG tablet Take 5 mg by mouth at bedtime.    . pantoprazole (PROTONIX) 40 MG tablet Take 1 tablet (40 mg total) by mouth daily. (Patient taking differently: Take 40 mg by mouth daily as needed (acid reflux). ) 30 tablet 6  . albuterol (PROVENTIL HFA;VENTOLIN HFA) 108 (90 Base) MCG/ACT inhaler Inhale 2 puffs into the lungs every 4 (four) hours as needed for wheezing or shortness of breath. 1 Inhaler 2   No facility-administered medications prior to visit.     ROS Review of Systems  Constitutional: Negative for activity change, appetite change and fatigue.  HENT: Negative for congestion, sinus pressure and sore throat.   Eyes: Negative for visual disturbance.  Respiratory: Positive for cough, shortness of breath and wheezing. Negative for chest tightness.   Cardiovascular: Negative for chest pain and palpitations.  Gastrointestinal: Negative for abdominal distention, abdominal  pain and constipation.  Endocrine: Negative for polydipsia.  Genitourinary: Negative for dysuria and frequency.  Musculoskeletal: Negative for arthralgias and back pain.  Skin: Negative for rash.  Neurological: Negative for tremors, light-headedness and numbness.  Hematological: Does not bruise/bleed easily.  Psychiatric/Behavioral: Negative for agitation and behavioral problems.    Objective:  BP (!) 149/85 (BP Location: Left Arm, Patient Position: Sitting, Cuff Size: Normal)   Pulse 73   Temp 99.4 F (37.4 C) (Oral)   Resp 18   Ht 4\' 11"  (1.499 m)   Wt 165 lb (74.8 kg)   SpO2 90%   BMI  33.33 kg/m   BP/Weight 04/07/2018 03/01/2018 03/01/5808  Systolic BP 983 382 505  Diastolic BP 85 75 81  Wt. (Lbs) 165 164.8 160  BMI 33.33 33.29 32.32       Physical Exam Constitutional:      Appearance: She is well-developed.  Cardiovascular:     Rate and Rhythm: Normal rate.     Heart sounds: Normal heart sounds. No murmur.     Comments: Reduced air entry in the right lower lobe Pulmonary:     Effort: Pulmonary effort is normal.     Breath sounds: Normal breath sounds. No wheezing or rales.  Chest:     Chest wall: No tenderness.  Abdominal:     General: Bowel sounds are normal. There is no distension.     Palpations: Abdomen is soft. There is no mass.     Tenderness: There is no abdominal tenderness.  Musculoskeletal: Normal range of motion.  Neurological:     Mental Status: She is alert and oriented to person, place, and time.  Psychiatric:        Mood and Affect: Mood normal.        Behavior: Behavior normal.      Assessment & Plan:   1. COPD with acute exacerbation ([place]) Acute exacerbation currently triggered by pneumonia developing We will proceed with treatment for community acquired pneumonia Provided note for work to be excused until 04/10/2018 - albuterol (PROVENTIL HFA;VENTOLIN HFA) 108 (90 Base) MCG/ACT inhaler; Inhale 2 puffs into the lungs every 4 (four) hours as needed for wheezing or shortness of breath.  Dispense: 1 Inhaler; Refill: 2 - predniSONE (DELTASONE) 20 MG tablet; Take 1 tablet (20 mg total) by mouth 2 (two) times daily with a meal. First dose 04/08/18  Dispense: 10 tablet; Refill: 0 - levofloxacin (LEVAQUIN) 500 MG tablet; Take 1 tablet (500 mg total) by mouth daily.  Dispense: 7 tablet; Refill: 0 - ipratropium-albuterol (DUONEB) 0.5-2.5 (3) MG/3ML nebulizer solution 3 mL - cefTRIAXone (ROCEPHIN) injection 500 mg - methylPREDNISolone sodium succinate (SOLU-MEDROL) 125 mg/2 mL injection 125 mg  2. Acute respiratory failure with hypoxia  ([facility]) Hypoxia with oxygen saturation of 90% on room air -she left her oxygen tank at home 2 L of oxygen administered in the clinic  3. Lung nodule Spotted on chest x-ray from ED visit in 01/2018   4. Abnormal findings on diagnostic imaging of lung - CT Chest W Contrast; Future   Meds ordered this encounter  Medications  . albuterol (PROVENTIL HFA;VENTOLIN HFA) 108 (90 Base) MCG/ACT inhaler    Sig: Inhale 2 puffs into the lungs every 4 (four) hours as needed for wheezing or shortness of breath.    Dispense:  1 Inhaler    Refill:  2  . predniSONE (DELTASONE) 20 MG tablet    Sig: Take 1 tablet (20 mg total) by mouth 2 (two) times daily  with a meal. First dose 04/08/18    Dispense:  10 tablet    Refill:  0  . levofloxacin (LEVAQUIN) 500 MG tablet    Sig: Take 1 tablet (500 mg total) by mouth daily.    Dispense:  7 tablet    Refill:  0  . ipratropium-albuterol (DUONEB) 0.5-2.5 (3) MG/3ML nebulizer solution 3 mL  . cefTRIAXone (ROCEPHIN) injection 500 mg  . methylPREDNISolone sodium succinate (SOLU-MEDROL) 125 mg/2 mL injection 125 mg    Follow-up: Return in about 2 weeks (around 04/21/2018) for Follow-up on COPD exacerbation with PCP.   Charlott Rakes MD

## 2018-04-07 NOTE — Patient Instructions (Signed)
Chronic Obstructive Pulmonary Disease Chronic obstructive pulmonary disease (COPD) is a long-term (chronic) lung problem. When you have COPD, it is hard for air to get in and out of your lungs. Usually the condition gets worse over time, and your lungs will never return to normal. There are things you can do to keep yourself as healthy as possible.  Your doctor may treat your condition with: ? Medicines. ? Oxygen. ? Lung surgery.  Your doctor may also recommend: ? Rehabilitation. This includes steps to make your body work better. It may involve a team of specialists. ? Quitting smoking, if you smoke. ? Exercise and changes to your diet. ? Comfort measures (palliative care). Follow these instructions at home: Medicines  Take over-the-counter and prescription medicines only as told by your doctor.  Talk to your doctor before taking any cough or allergy medicines. You may need to avoid medicines that cause your lungs to be dry. Lifestyle  If you smoke, stop. Smoking makes the problem worse. If you need help quitting, ask your doctor.  Avoid being around things that make your breathing worse. This may include smoke, chemicals, and fumes.  Stay active, but remember to rest as well.  Learn and use tips on how to relax.  Make sure you get enough sleep. Most adults need at least 7 hours of sleep every night.  Eat healthy foods. Eat smaller meals more often. Rest before meals. Controlled breathing Learn and use tips on how to control your breathing as told by your doctor. Try:  Breathing in (inhaling) through your nose for 1 second. Then, pucker your lips and breath out (exhale) through your lips for 2 seconds.  Putting one hand on your belly (abdomen). Breathe in slowly through your nose for 1 second. Your hand on your belly should move out. Pucker your lips and breathe out slowly through your lips. Your hand on your belly should move in as you breathe out.  Controlled coughing Learn  and use controlled coughing to clear mucus from your lungs. Follow these steps: 1. Lean your head a little forward. 2. Breathe in deeply. 3. Try to hold your breath for 3 seconds. 4. Keep your mouth slightly open while coughing 2 times. 5. Spit any mucus out into a tissue. 6. Rest and do the steps again 1 or 2 times as needed. General instructions  Make sure you get all the shots (vaccines) that your doctor recommends. Ask your doctor about a flu shot and a pneumonia shot.  Use oxygen therapy and pulmonary rehabilitation if told by your doctor. If you need home oxygen therapy, ask your doctor if you should buy a tool to measure your oxygen level (oximeter).  Make a COPD action plan with your doctor. This helps you to know what to do if you feel worse than usual.  Manage any other conditions you have as told by your doctor.  Avoid going outside when it is very hot, cold, or humid.  Avoid people who have a sickness you can catch (contagious).  Keep all follow-up visits as told by your doctor. This is important. Contact a doctor if:  You cough up more mucus than usual.  There is a change in the color or thickness of the mucus.  It is harder to breathe than usual.  Your breathing is faster than usual.  You have trouble sleeping.  You need to use your medicines more often than usual.  You have trouble doing your normal activities such as getting dressed   or walking around the house. Get help right away if:  You have shortness of breath while resting.  You have shortness of breath that stops you from: ? Being able to talk. ? Doing normal activities.  Your chest hurts for longer than 5 minutes.  Your skin color is more blue than usual.  Your pulse oximeter shows that you have low oxygen for longer than 5 minutes.  You have a fever.  You feel too tired to breathe normally. Summary  Chronic obstructive pulmonary disease (COPD) is a long-term lung problem.  The way your  lungs work will never return to normal. Usually the condition gets worse over time. There are things you can do to keep yourself as healthy as possible.  Take over-the-counter and prescription medicines only as told by your doctor.  If you smoke, stop. Smoking makes the problem worse. This information is not intended to replace advice given to you by your health care provider. Make sure you discuss any questions you have with your health care provider. Document Released: 09/01/2007 Document Revised: 04/19/2016 Document Reviewed: 04/19/2016 [employer] Interactive Patient Education  2019 [employer]  

## 2018-04-12 NOTE — Patient Instructions (Signed)
Monica Scott  04/12/2018     @PREFPERIOPPHARMACY @   Your procedure is scheduled on  04/24/2018  Report to [facility] at  1   A.M.  Call this number if you have problems the morning of surgery:  [phone]   Remember:  Follow the diet and prep instructions given to you by Dr Roseanne Kaufman office.                        Take these medicines the morning of surgery with A SIP OF WATER  Amlodipine, prozac, gabapentin, lisinopril, protonix. Use your inhaler and your nebulizer before you come. Bring your inhaler with you when you come.    Do not wear jewelry, make-up or nail polish.  Do not wear lotions, powders, or perfumes, or deodorant.  Do not shave 48 hours prior to surgery.  Men may shave face and neck.  Do not bring valuables to the hospital.  [facility] is not responsible for any belongings or valuables.  Contacts, dentures or bridgework may not be worn into surgery.  Leave your suitcase in the car.  After surgery it may be brought to your room.  For patients admitted to the hospital, discharge time will be determined by your treatment team.  Patients discharged the day of surgery will not be allowed to drive home.   Name and phone number of your driver:   family Special instructions:  None  Please read over the following fact sheets that you were given. Anesthesia Post-op Instructions and Care and Recovery After Surgery       Upper Endoscopy, Adult Upper endoscopy is a procedure to look inside the upper GI (gastrointestinal) tract. The upper GI tract is made up of:  The part of the body that moves food from your mouth to your stomach (esophagus).  The stomach.  The first part of your small intestine (duodenum). This procedure is also called esophagogastroduodenoscopy (EGD) or gastroscopy. In this procedure, your health care provider passes a thin, flexible tube (endoscope) through your mouth and down your esophagus into your stomach. A  small camera is attached to the end of the tube. Images from the camera appear on a monitor in the exam room. During this procedure, your health care provider may also remove a small piece of tissue to be sent to a lab and examined under a microscope (biopsy). Your health care provider may do an upper endoscopy to diagnose cancers of the upper GI tract. You may also have this procedure to find the cause of other conditions, such as:  Stomach pain.  Heartburn.  Pain or problems when swallowing.  Nausea and vomiting.  Stomach bleeding.  Stomach ulcers. Tell a health care provider about:  Any allergies you have.  All medicines you are taking, including vitamins, herbs, eye drops, creams, and over-the-counter medicines.  Any problems you or family members have had with anesthetic medicines.  Any blood disorders you have.  Any surgeries you have had.  Any medical conditions you have.  Whether you are pregnant or may be pregnant. What are the risks? Generally, this is a safe procedure. However, problems may occur, including:  Infection.  Bleeding.  Allergic reactions to medicines.  A tear or hole (perforation) in the esophagus, stomach, or duodenum. What happens before the procedure? Staying hydrated Follow instructions from your health care provider about hydration, which may include:  Up to 2 hours before the procedure - you may continue to drink clear liquids, such as water, clear fruit juice, black coffee, and plain tea.  Eating and drinking restrictions Follow instructions from your health care provider about eating and drinking, which may include:  8 hours before the procedure - stop eating heavy meals or foods, such as meat, fried foods, or fatty foods.  6 hours before the procedure - stop eating light meals or foods, such as toast or cereal.  6 hours before the procedure - stop drinking milk or drinks that contain milk.  2 hours before the procedure - stop  drinking clear liquids. Medicines Ask your health care provider about:  Changing or stopping your regular medicines. This is especially important if you are taking diabetes medicines or blood thinners.  Taking medicines such as aspirin and ibuprofen. These medicines can thin your blood. Do not take these medicines unless your health care provider tells you to take them.  Taking over-the-counter medicines, vitamins, herbs, and supplements. General instructions  Plan to have someone take you home from the hospital or clinic.  If you will be going home right after the procedure, plan to have someone with you for 24 hours.  Ask your health care provider what steps will be taken to help prevent infection. What happens during the procedure?   An IV will be inserted into one of your veins.  You may be given one or more of the following: ? A medicine to help you relax (sedative). ? A medicine to numb the throat (local anesthetic).  You will lie on your left side on an exam table.  Your health care provider will pass the endoscope through your mouth and down your esophagus.  Your health care provider will use the scope to check the inside of your esophagus, stomach, and duodenum. Biopsies may be taken.  The endoscope will be removed. The procedure may vary among health care providers and hospitals. What happens after the procedure?  Your blood pressure, heart rate, breathing rate, and blood oxygen level will be monitored until you leave the hospital or clinic.  Do not drive for 24 hours if you were given a sedative during your procedure.  When your throat is no longer numb, you may be given some fluids to drink.  It is up to you to get the results of your procedure. Ask your health care provider, or the department that is doing the procedure, when your results will be ready. Summary  Upper endoscopy is a procedure to look inside the upper GI tract.  During the procedure, an IV  will be inserted into one of your veins. You may be given a medicine to help you relax.  A medicine will be used to numb your throat.  The endoscope will be passed through your mouth and down your esophagus. This information is not intended to replace advice given to you by your health care provider. Make sure you discuss any questions you have with your health care provider. Document Released: 03/12/2000 Document Revised: 08/15/2017 Document Reviewed: 08/15/2017 Elsevier Interactive Patient Education  2019 [place] Endoscopy, Adult, Care After This sheet gives you information about how to care for yourself after your procedure. Your health care provider may also give you more specific instructions. If you have problems or questions, contact your health care provider. What can I expect after the procedure? After the procedure, it is common to have:  A sore throat.  Mild stomach  pain or discomfort.  Bloating.  Nausea. Follow these instructions at home:   Follow instructions from your health care provider about what to eat or drink after your procedure.  Return to your normal activities as told by your health care provider. Ask your health care provider what activities are safe for you.  Take over-the-counter and prescription medicines only as told by your health care provider.  Do not drive for 24 hours if you were given a sedative during your procedure.  Keep all follow-up visits as told by your health care provider. This is important. Contact a health care provider if you have:  A sore throat that lasts longer than one day.  Trouble swallowing. Get help right away if:  You vomit blood or your vomit looks like coffee grounds.  You have: ? A fever. ? Bloody, black, or tarry stools. ? A severe sore throat or you cannot swallow. ? Difficulty breathing. ? Severe pain in your chest or abdomen. Summary  After the procedure, it is common to have a sore throat,  mild stomach discomfort, bloating, and nausea.  Do not drive for 24 hours if you were given a sedative during the procedure.  Follow instructions from your health care provider about what to eat or drink after your procedure.  Return to your normal activities as told by your health care provider. This information is not intended to replace advice given to you by your health care provider. Make sure you discuss any questions you have with your health care provider. Document Released: 09/14/2011 Document Revised: 08/15/2017 Document Reviewed: 08/15/2017 Elsevier Interactive Patient Education  2019 [employer]  Esophageal Dilatation Esophageal dilatation, also called esophageal dilation, is a procedure to widen or open (dilate) a blocked or narrowed part of the esophagus. The esophagus is the part of the body that moves food and liquid from the mouth to the stomach. You may need this procedure if:  You have a buildup of scar tissue in your esophagus that makes it difficult, painful, or impossible to swallow. This can be caused by gastroesophageal reflux disease (GERD).  You have cancer of the esophagus.  There is a problem with how food moves through your esophagus. In some cases, you may need this procedure repeated at a later time to dilate the esophagus gradually. Tell a health care provider about:  Any allergies you have.  All medicines you are taking, including vitamins, herbs, eye drops, creams, and over-the-counter medicines.  Any problems you or family members have had with anesthetic medicines.  Any blood disorders you have.  Any surgeries you have had.  Any medical conditions you have.  Any antibiotic medicines you are required to take before dental procedures.  Whether you are pregnant or may be pregnant. What are the risks? Generally, this is a safe procedure. However, problems may occur, including:  Bleeding due to a tear in the lining of the esophagus.  A hole  (perforation) in the esophagus. What happens before the procedure?  Follow instructions from your health care provider about eating or drinking restrictions.  Ask your health care provider about changing or stopping your regular medicines. This is especially important if you are taking diabetes medicines or blood thinners.  Plan to have someone take you home from the hospital or clinic.  Plan to have a responsible adult care for you for at least 24 hours after you leave the hospital or clinic. This is important. What happens during the procedure?  You may be given  a medicine to help you relax (sedative).  A numbing medicine may be sprayed into the back of your throat, or you may gargle the medicine.  Your health care provider may perform the dilatation using various surgical instruments, such as: ? Simple dilators. This instrument is carefully placed in the esophagus to stretch it. ? Guided wire bougies. This involves using an endoscope to insert a wire into the esophagus. A dilator is passed over this wire to enlarge the esophagus. Then the wire is removed. ? Balloon dilators. An endoscope with a small balloon at the end is inserted into the esophagus. The balloon is inflated to stretch the esophagus and open it up. The procedure may vary among health care providers and hospitals. What happens after the procedure?  Your blood pressure, heart rate, breathing rate, and blood oxygen level will be monitored until the medicines you were given have worn off.  Your throat may feel slightly sore and numb. This will improve slowly over time.  You will not be allowed to eat or drink until your throat is no longer numb.  When you are able to drink, urinate, and sit on the edge of the bed without nausea or dizziness, you may be able to return home. Follow these instructions at home:  Take over-the-counter and prescription medicines only as told by your health care provider.  Do not drive for  24 hours if you were given a sedative during your procedure.  You should have a responsible adult with you for 24 hours after the procedure.  Follow instructions from your health care provider about any eating or drinking restrictions.  Do not use any products that contain nicotine or tobacco, such as cigarettes and e-cigarettes. If you need help quitting, ask your health care provider.  Keep all follow-up visits as told by your health care provider. This is important. Get help right away if you:  Have a fever.  Have chest pain.  Have pain that is not relieved by medication.  Have trouble breathing.  Have trouble swallowing.  Vomit blood. Summary  Esophageal dilatation, also called esophageal dilation, is a procedure to widen or open (dilate) a blocked or narrowed part of the esophagus.  Plan to have someone take you home from the hospital or clinic.  For this procedure, a numbing medicine may be sprayed into the back of your throat, or you may gargle the medicine.  Do not drive for 24 hours if you were given a sedative during your procedure. This information is not intended to replace advice given to you by your health care provider. Make sure you discuss any questions you have with your health care provider. Document Released: 05/06/2005 Document Revised: 01/18/2017 Document Reviewed: 01/18/2017 Elsevier Interactive Patient Education  2019 [employer].  Colonoscopy, Adult A colonoscopy is an exam to look at the large intestine. It is done to check for problems, such as:  Lumps (tumors).  Growths (polyps).  Swelling (inflammation).  Bleeding. What happens before the procedure? Eating and drinking Follow instructions from your doctor about eating and drinking. These instructions may include:  A few days before the procedure - follow a low-fiber diet. ? Avoid nuts. ? Avoid seeds. ? Avoid dried fruit. ? Avoid raw fruits. ? Avoid vegetables.  1-3 days before the  procedure - follow a clear liquid diet. Avoid liquids that have red or purple dye. Drink only clear liquids, such as: ? Clear broth or bouillon. ? Black coffee or tea. ? Clear juice. ?  Clear soft drinks or sports drinks. ? Gelatin dessert. ? Popsicles.  On the day of the procedure - do not eat or drink anything during the 2 hours before the procedure. Up to 2 hours before the procedure, you may continue to drink clear liquids, such as water or clear fruit juice.  Bowel prep If you were prescribed an oral bowel prep:  Take it as told by your doctor. Starting the day before your procedure, you will need to drink a lot of liquid. The liquid will cause you to poop (have bowel movements) until your poop is almost clear or light green.  To clean out your colon, you may also be given: ? Laxative medicines. ? Instructions about how to use an enema.  If your skin or butt gets irritated from diarrhea, you may: ? Wipe the area with wipes that have medicine in them, such as adult wet wipes with aloe and vitamin E. ? Put something on your skin that soothes the area, such as petroleum jelly.  If you throw up (vomit) while drinking the bowel prep, take a break for up to 60 minutes. Then begin the bowel prep again. If you keep throwing up and you cannot take the bowel prep without throwing up, call your doctor. General instructions  Ask your doctor about: ? Changing or stopping your normal medicines. This is important if you take iron pills, diabetes medicines, or blood thinners. ? Taking medicines such as aspirin and ibuprofen. These medicines can thin your blood. Do not take these medicines unless your doctor tells you to take them.  Plan to have someone take you home from the hospital or clinic. What happens during the procedure?   An IV tube may be put into one of your veins.  You will be given medicine to help you relax (sedative).  To reduce your risk of infection: ? Your doctors will  wash their hands. ? Your anal area will be washed with soap.  You will be asked to lie on your side with your knees bent.  Your doctor will get a long, thin, flexible tube ready. The tube will have a camera and a light on the end.  The tube will be put into your anus.  The tube will be gently put into your large intestine.  Air will be delivered into your large intestine to keep it open. You may feel some pressure or cramping.  The camera will be used to take photos.  A small tissue sample may be removed for testing (biopsy).  If small growths are found, your doctor may remove them and have them checked for cancer.  The tube that was put into your anus will be slowly removed. The procedure may vary among doctors and hospitals. What happens after the procedure?  Your doctor will check on you often until the medicines you were given have worn off.  Do not drive for 24 hours after the procedure.  You may have a small amount of blood in your poop.  You may pass gas.  You may have mild cramps or bloating in your belly (abdomen).  It is up to you to get the results of your procedure. Ask your doctor, or the department performing the procedure, when your results will be ready. Summary  A colonoscopy is an exam to look at the large intestine.  Follow instructions from your doctor about eating and drinking before the procedure.  If you were prescribed an oral bowel prep to clean out  your colon, take it as told by your doctor.  Your doctor will check on you often until the medicines you were given have worn off.  Plan to have someone take you home from the hospital or clinic. This information is not intended to replace advice given to you by your health care provider. Make sure you discuss any questions you have with your health care provider. Document Released: 04/17/2010 Document Revised: 01/12/2017 Document Reviewed: 05/27/2015 Elsevier Interactive Patient Education  2019  [employer] Inc[employer]  Colonoscopy, Adult, Care After This sheet gives you information about how to care for yourself after your procedure. Your health care provider may also give you more specific instructions. If you have problems or questions, contact your health care provider. What can I expect after the procedure? After the procedure, it is common to have:  A small amount of blood in your stool for 24 hours after the procedure.  Some gas.  Mild abdominal cramping or bloating. Follow these instructions at home: General instructions  For the first 24 hours after the procedure: ? Do not drive or use machinery. ? Do not sign important documents. ? Do not drink alcohol. ? Do your regular daily activities at a slower pace than normal. ? Eat soft, easy-to-digest foods.  Take over-the-counter or prescription medicines only as told by your health care provider. Relieving cramping and bloating   Try walking around when you have cramps or feel bloated.  Apply heat to your abdomen as told by your health care provider. Use a heat source that your health care provider recommends, such as a moist heat pack or a heating pad. ? Place a towel between your skin and the heat source. ? Leave the heat on for 20-30 minutes. ? Remove the heat if your skin turns bright red. This is especially important if you are unable to feel pain, heat, or cold. You may have a greater risk of getting burned. Eating and drinking   Drink enough fluid to keep your urine pale yellow.  Resume your normal diet as instructed by your health care provider. Avoid heavy or fried foods that are hard to digest.  Avoid drinking alcohol for as long as instructed by your health care provider. Contact a health care provider if:  You have blood in your stool 2-3 days after the procedure. Get help right away if:  You have more than a small spotting of blood in your stool.  You pass large blood clots in your stool.  Your abdomen  is swollen.  You have nausea or vomiting.  You have a fever.  You have increasing abdominal pain that is not relieved with medicine. Summary  After the procedure, it is common to have a small amount of blood in your stool. You may also have mild abdominal cramping and bloating.  For the first 24 hours after the procedure, do not drive or use machinery, sign important documents, or drink alcohol.  Contact your health care provider if you have a lot of blood in your stool, nausea or vomiting, a fever, or increased abdominal pain. This information is not intended to replace advice given to you by your health care provider. Make sure you discuss any questions you have with your health care provider. Document Released: 10/28/2003 Document Revised: 01/05/2017 Document Reviewed: 05/27/2015 Elsevier Interactive Patient Education  2019 [place] Anesthesia is a term that refers to techniques, procedures, and medicines that help a person stay safe and comfortable during a  medical procedure. Monitored anesthesia care, or sedation, is one type of anesthesia. Your anesthesia specialist may recommend sedation if you will be having a procedure that does not require you to be unconscious, such as:  Cataract surgery.  A dental procedure.  A biopsy.  A colonoscopy. During the procedure, you may receive a medicine to help you relax (sedative). There are three levels of sedation:  Mild sedation. At this level, you may feel awake and relaxed. You will be able to follow directions.  Moderate sedation. At this level, you will be sleepy. You may not remember the procedure.  Deep sedation. At this level, you will be asleep. You will not remember the procedure. The more medicine you are given, the deeper your level of sedation will be. Depending on how you respond to the procedure, the anesthesia specialist may change your level of sedation or the type of anesthesia to fit  your needs. An anesthesia specialist will monitor you closely during the procedure. Let your health care provider know about:  Any allergies you have.  All medicines you are taking, including vitamins, herbs, eye drops, creams, and over-the-counter medicines.  Any use of steroids (by mouth or as a cream).  Any problems you or family members have had with sedatives and anesthetic medicines.  Any blood disorders you have.  Any surgeries you have had.  Any medical conditions you have, such as sleep apnea.  Whether you are pregnant or may be pregnant.  Any use of cigarettes, alcohol, or street drugs. What are the risks? Generally, this is a safe procedure. However, problems may occur, including:  Getting too much medicine (oversedation).  Nausea.  Allergic reaction to medicines.  Trouble breathing. If this happens, a breathing tube may be used to help with breathing. It will be removed when you are awake and breathing on your own.  Heart trouble.  Lung trouble. Before the procedure Staying hydrated Follow instructions from your health care provider about hydration, which may include:  Up to 2 hours before the procedure - you may continue to drink clear liquids, such as water, clear fruit juice, black coffee, and plain tea. Eating and drinking restrictions Follow instructions from your health care provider about eating and drinking, which may include:  8 hours before the procedure - stop eating heavy meals or foods such as meat, fried foods, or fatty foods.  6 hours before the procedure - stop eating light meals or foods, such as toast or cereal.  6 hours before the procedure - stop drinking milk or drinks that contain milk.  2 hours before the procedure - stop drinking clear liquids. Medicines Ask your health care provider about:  Changing or stopping your regular medicines. This is especially important if you are taking diabetes medicines or blood thinners.  Taking  medicines such as aspirin and ibuprofen. These medicines can thin your blood. Do not take these medicines before your procedure if your health care provider instructs you not to. Tests and exams  You will have a physical exam.  You may have blood tests done to show: ? How well your kidneys and liver are working. ? How well your blood can clot. General instructions  Plan to have someone take you home from the hospital or clinic.  If you will be going home right after the procedure, plan to have someone with you for 24 hours.  What happens during the procedure?  Your blood pressure, heart rate, breathing, level of pain and overall condition will  be monitored.  An IV tube will be inserted into one of your veins.  Your anesthesia specialist will give you medicines as needed to keep you comfortable during the procedure. This may mean changing the level of sedation.  The procedure will be performed. After the procedure  Your blood pressure, heart rate, breathing rate, and blood oxygen level will be monitored until the medicines you were given have worn off.  Do not drive for 24 hours if you received a sedative.  You may: ? Feel sleepy, clumsy, or nauseous. ? Feel forgetful about what happened after the procedure. ? Have a sore throat if you had a breathing tube during the procedure. ? Vomit. This information is not intended to replace advice given to you by your health care provider. Make sure you discuss any questions you have with your health care provider. Document Released: 12/09/2004 Document Revised: 08/22/2015 Document Reviewed: 07/06/2015 Elsevier Interactive Patient Education  2019 [employer], Care After These instructions provide you with information about caring for yourself after your procedure. Your health care provider may also give you more specific instructions. Your treatment has been planned according to current medical practices, but  problems sometimes occur. Call your health care provider if you have any problems or questions after your procedure. What can I expect after the procedure? After your procedure, you may:  Feel sleepy for several hours.  Feel clumsy and have poor balance for several hours.  Feel forgetful about what happened after the procedure.  Have poor judgment for several hours.  Feel nauseous or vomit.  Have a sore throat if you had a breathing tube during the procedure. Follow these instructions at home: For at least 24 hours after the procedure:      Have a responsible adult stay with you. It is important to have someone help care for you until you are awake and alert.  Rest as needed.  Do not: ? Participate in activities in which you could fall or become injured. ? Drive. ? Use heavy machinery. ? Drink alcohol. ? Take sleeping pills or medicines that cause drowsiness. ? Make important decisions or sign legal documents. ? Take care of children on your own. Eating and drinking  Follow the diet that is recommended by your health care provider.  If you vomit, drink water, juice, or soup when you can drink without vomiting.  Make sure you have little or no nausea before eating solid foods. General instructions  Take over-the-counter and prescription medicines only as told by your health care provider.  If you have sleep apnea, surgery and certain medicines can increase your risk for breathing problems. Follow instructions from your health care provider about wearing your sleep device: ? Anytime you are sleeping, including during daytime naps. ? While taking prescription pain medicines, sleeping medicines, or medicines that make you drowsy.  If you smoke, do not smoke without supervision.  Keep all follow-up visits as told by your health care provider. This is important. Contact a health care provider if:  You keep feeling nauseous or you keep vomiting.  You feel  light-headed.  You develop a rash.  You have a fever. Get help right away if:  You have trouble breathing. Summary  For several hours after your procedure, you may feel sleepy and have poor judgment.  Have a responsible adult stay with you for at least 24 hours or until you are awake and alert. This information is not intended to replace advice given  to you by your health care provider. Make sure you discuss any questions you have with your health care provider. Document Released: 07/06/2015 Document Revised: 10/29/2016 Document Reviewed: 07/06/2015 Elsevier Interactive Patient Education  2019 [employer].

## 2018-04-13 MED FILL — LISINOPRIL 20 MG TAB: 20 | 30 days supply | Qty: 60 | Fill #1

## 2018-04-18 MED FILL — AMLODIPINE BESYLATE 5 MG TA: 5 | 30 days supply | Qty: 30 | Fill #1

## 2018-04-18 MED FILL — ?PANTOPRAZOLE SO DR 40MG TA: 40 | 30 days supply | Qty: 30 | Fill #2

## 2018-04-19 ENCOUNTER — Encounter (HOSPITAL_COMMUNITY): Payer: Self-pay

## 2018-04-19 ENCOUNTER — Other Ambulatory Visit: Payer: Self-pay

## 2018-04-19 ENCOUNTER — Encounter (HOSPITAL_COMMUNITY)
Admission: RE | Admit: 2018-04-19 | Discharge: 2018-04-19 | Disposition: A | Discharge status: Home / Self Care | Payer: Medicaid Other | Source: Ambulatory Visit | Attending: Internal Medicine | Admitting: Internal Medicine

## 2018-04-19 DIAGNOSIS — Z01812 Encounter for preprocedural laboratory examination: Secondary | ICD-10-CM | POA: Insufficient documentation

## 2018-04-19 HISTORY — DX: Gastro-esophageal reflux disease without esophagitis: K21.9

## 2018-04-19 HISTORY — DX: Unspecified osteoarthritis, unspecified site: M19.90

## 2018-04-19 LAB — CBC
HCT: 40.3 % (ref 36.0–46.0)
Hemoglobin: 12.4 g/dL (ref 12.0–15.0)
MCH: 28 pg (ref 26.0–34.0)
MCHC: 30.8 g/dL (ref 30.0–36.0)
MCV: 91 fL (ref 80.0–100.0)
Platelets: 386 10*3/uL (ref 150–400)
RBC: 4.43 MIL/uL (ref 3.87–5.11)
RDW: 14.8 % (ref 11.5–15.5)
WBC: 11.7 10*3/uL — ABNORMAL HIGH (ref 4.0–10.5)
nRBC: 0 % (ref 0.0–0.2)

## 2018-04-19 LAB — BASIC METABOLIC PANEL
Anion gap: 10 (ref 5–15)
BUN: 8 mg/dL (ref 8–23)
CO2: 27 mmol/L (ref 22–32)
Calcium: 8.8 mg/dL — ABNORMAL LOW (ref 8.9–10.3)
Chloride: 103 mmol/L (ref 98–111)
Creatinine, Ser: 0.74 mg/dL (ref 0.44–1.00)
GFR calc Af Amer: >60 mL/min (ref >60)
GFR calc non Af Amer: >60 mL/min (ref >60)
Glucose, Bld: 122 mg/dL — ABNORMAL HIGH (ref 70–99)
Potassium: 3.5 mmol/L (ref 3.5–5.1)
Sodium: 140 mmol/L (ref 135–145)

## 2018-04-24 ENCOUNTER — Ambulatory Visit (HOSPITAL_COMMUNITY)
Admission: RE | Admit: 2018-04-24 | Discharge: 2018-04-24 | Disposition: A | Discharge status: Home / Self Care | Payer: Medicaid Other | Source: Ambulatory Visit | Attending: Internal Medicine | Admitting: Internal Medicine

## 2018-04-24 ENCOUNTER — Encounter (HOSPITAL_COMMUNITY)
Admission: RE | Disposition: A | Discharge status: Home / Self Care | Payer: Self-pay | Source: Ambulatory Visit | Attending: Internal Medicine

## 2018-04-24 ENCOUNTER — Encounter (HOSPITAL_COMMUNITY): Payer: Self-pay

## 2018-04-24 ENCOUNTER — Ambulatory Visit (HOSPITAL_COMMUNITY): Payer: Medicaid Other | Admitting: Anesthesiology

## 2018-04-24 DIAGNOSIS — E785 Hyperlipidemia, unspecified: Secondary | ICD-10-CM | POA: Diagnosis not present

## 2018-04-24 DIAGNOSIS — Z1211 Encounter for screening for malignant neoplasm of colon: Secondary | ICD-10-CM | POA: Diagnosis not present

## 2018-04-24 DIAGNOSIS — K449 Diaphragmatic hernia without obstruction or gangrene: Secondary | ICD-10-CM | POA: Insufficient documentation

## 2018-04-24 DIAGNOSIS — Z9981 Dependence on supplemental oxygen: Secondary | ICD-10-CM | POA: Insufficient documentation

## 2018-04-24 DIAGNOSIS — F329 Major depressive disorder, single episode, unspecified: Secondary | ICD-10-CM | POA: Insufficient documentation

## 2018-04-24 DIAGNOSIS — K529 Noninfective gastroenteritis and colitis, unspecified: Secondary | ICD-10-CM | POA: Insufficient documentation

## 2018-04-24 DIAGNOSIS — D123 Benign neoplasm of transverse colon: Secondary | ICD-10-CM | POA: Diagnosis not present

## 2018-04-24 DIAGNOSIS — K219 Gastro-esophageal reflux disease without esophagitis: Secondary | ICD-10-CM | POA: Diagnosis not present

## 2018-04-24 DIAGNOSIS — K635 Polyp of colon: Secondary | ICD-10-CM | POA: Diagnosis not present

## 2018-04-24 DIAGNOSIS — Z7951 Long term (current) use of inhaled steroids: Secondary | ICD-10-CM | POA: Insufficient documentation

## 2018-04-24 DIAGNOSIS — Z79899 Other long term (current) drug therapy: Secondary | ICD-10-CM | POA: Insufficient documentation

## 2018-04-24 DIAGNOSIS — I10 Essential (primary) hypertension: Secondary | ICD-10-CM | POA: Insufficient documentation

## 2018-04-24 DIAGNOSIS — K5730 Diverticulosis of large intestine without perforation or abscess without bleeding: Secondary | ICD-10-CM | POA: Insufficient documentation

## 2018-04-24 DIAGNOSIS — R1319 Other dysphagia: Secondary | ICD-10-CM

## 2018-04-24 DIAGNOSIS — J449 Chronic obstructive pulmonary disease, unspecified: Secondary | ICD-10-CM | POA: Diagnosis not present

## 2018-04-24 DIAGNOSIS — R1310 Dysphagia, unspecified: Secondary | ICD-10-CM | POA: Diagnosis not present

## 2018-04-24 DIAGNOSIS — Z87891 Personal history of nicotine dependence: Secondary | ICD-10-CM | POA: Diagnosis not present

## 2018-04-24 DIAGNOSIS — R197 Diarrhea, unspecified: Secondary | ICD-10-CM

## 2018-04-24 DIAGNOSIS — D120 Benign neoplasm of cecum: Secondary | ICD-10-CM

## 2018-04-24 DIAGNOSIS — D125 Benign neoplasm of sigmoid colon: Secondary | ICD-10-CM

## 2018-04-24 DIAGNOSIS — K228 Other specified diseases of esophagus: Secondary | ICD-10-CM | POA: Diagnosis not present

## 2018-04-24 DIAGNOSIS — Z800 Family history of malignant neoplasm of digestive organs: Secondary | ICD-10-CM | POA: Diagnosis not present

## 2018-04-24 HISTORY — PX: POLYPECTOMY: SHX5525

## 2018-04-24 HISTORY — PX: COLONOSCOPY WITH PROPOFOL: SHX5780

## 2018-04-24 HISTORY — PX: MALONEY DILATION: SHX5535

## 2018-04-24 HISTORY — PX: ESOPHAGOGASTRODUODENOSCOPY (EGD) WITH PROPOFOL: SHX5813

## 2018-04-24 HISTORY — PX: BIOPSY: SHX5522

## 2018-04-24 SURGERY — COLONOSCOPY WITH PROPOFOL
Anesthesia: Monitor Anesthesia Care

## 2018-04-24 MED ORDER — PROPOFOL 500 MG/50ML IV EMUL
INTRAVENOUS | Status: DC | PRN
  Administered 2018-04-24: 125 ug/kg/min via INTRAVENOUS
  Administered 2018-04-24: 09:00:00 via INTRAVENOUS

## 2018-04-24 MED ORDER — LACTATED RINGERS IV SOLN
INTRAVENOUS | Status: DC | PRN
  Administered 2018-04-24: 08:00:00 via INTRAVENOUS

## 2018-04-24 MED ORDER — CHLORHEXIDINE GLUCONATE CLOTH 2 % EX PADS: 6.0000 | MEDICATED_PAD | Freq: Once | CUTANEOUS | Status: DC

## 2018-04-24 MED ORDER — PROPOFOL 10 MG/ML IV BOLUS
INTRAVENOUS | Status: DC | PRN
  Administered 2018-04-24: 15 mg via INTRAVENOUS

## 2018-04-24 NOTE — Discharge Instructions (Signed)
°Colonoscopy °Discharge Instructions ° °Read the instructions outlined below and refer to this sheet in the next few weeks. These discharge instructions provide you with general information on caring for yourself after you leave the hospital. Your doctor may also give you specific instructions. While your treatment has been planned according to the most current medical practices available, unavoidable complications occasionally occur. If you have any problems or questions after discharge, call Dr. Rourk at [phone]. °ACTIVITY °· You may resume your regular activity, but move at a slower pace for the next 24 hours.  °· Take frequent rest periods for the next 24 hours.  °· Walking will help get rid of the air and reduce the bloated feeling in your belly (abdomen).  °· No driving for 24 hours (because of the medicine (anesthesia) used during the test).   °· Do not sign any important legal documents or operate any machinery for 24 hours (because of the anesthesia used during the test).  °NUTRITION °· Drink plenty of fluids.  °· You may resume your normal diet as instructed by your doctor.  °· Begin with a light meal and progress to your normal diet. Heavy or fried foods are harder to digest and may make you feel sick to your stomach (nauseated).  °· Avoid alcoholic beverages for 24 hours or as instructed.  °MEDICATIONS °· You may resume your normal medications unless your doctor tells you otherwise.  °WHAT YOU CAN EXPECT TODAY °· Some feelings of bloating in the abdomen.  °· Passage of more gas than usual.  °· Spotting of blood in your stool or on the toilet paper.  °IF YOU HAD POLYPS REMOVED DURING THE COLONOSCOPY: °· No aspirin products for 7 days or as instructed.  °· No alcohol for 7 days or as instructed.  °· Eat a soft diet for the next 24 hours.  °FINDING OUT THE RESULTS OF YOUR TEST °Not all test results are available during your visit. If your test results are not back during the visit, make an appointment  with your caregiver to find out the results. Do not assume everything is normal if you have not heard from your caregiver or the medical facility. It is important for you to follow up on all of your test results.  °SEEK IMMEDIATE MEDICAL ATTENTION IF: °· You have more than a spotting of blood in your stool.  °· Your belly is swollen (abdominal distention).  °· You are nauseated or vomiting.  °· You have a temperature over 101.  °· You have abdominal pain or discomfort that is severe or gets worse throughout the day.  °EGD °Discharge instructions °Please read the instructions outlined below and refer to this sheet in the next few weeks. These discharge instructions provide you with general information on caring for yourself after you leave the hospital. Your doctor may also give you specific instructions. While your treatment has been planned according to the most current medical practices available, unavoidable complications occasionally occur. If you have any problems or questions after discharge, please call your doctor. °ACTIVITY °· You may resume your regular activity but move at a slower pace for the next 24 hours.  °· Take frequent rest periods for the next 24 hours.  °· Walking will help expel (get rid of) the air and reduce the bloated feeling in your abdomen.  °· No driving for 24 hours (because of the anesthesia (medicine) used during the test).  °· You may shower.  °· Do not sign any important   legal documents or operate any machinery for 24 hours (because of the anesthesia used during the test).  NUTRITION  Drink plenty of fluids.   You may resume your normal diet.   Begin with a light meal and progress to your normal diet.   Avoid alcoholic beverages for 24 hours or as instructed by your caregiver.  MEDICATIONS  You may resume your normal medications unless your caregiver tells you otherwise.  WHAT YOU CAN EXPECT TODAY  You may experience abdominal discomfort such as a feeling of fullness  or gas pains.  FOLLOW-UP  Your doctor will discuss the results of your test with you.  SEEK IMMEDIATE MEDICAL ATTENTION IF ANY OF THE FOLLOWING OCCUR:  Excessive nausea (feeling sick to your stomach) and/or vomiting.   Severe abdominal pain and distention (swelling).   Trouble swallowing.   Temperature over 101 F (37.8 C).   Rectal bleeding or vomiting of blood.   Colon Polyps  Polyps are tissue growths inside the body. Polyps can grow in many places, including the large intestine (colon). A polyp may be a round bump or a mushroom-shaped growth. You could have one polyp or several. Most colon polyps are noncancerous (benign). However, some colon polyps can become cancerous over time. Finding and removing the polyps early can help prevent this. What are the causes? The exact cause of colon polyps is not known. What increases the risk? You are more likely to develop this condition if you:  Have a family history of colon cancer or colon polyps.  Are older than 29 or older than 45 if you are African American.  Have inflammatory bowel disease, such as ulcerative colitis or Crohn's disease.  Have certain hereditary conditions, such as: ? Familial adenomatous polyposis. ? Lynch syndrome. ? Turcot syndrome. ? Peutz-Jeghers syndrome.  Are overweight.  Smoke cigarettes.  Do not get enough exercise.  Drink too much alcohol.  Eat a diet that is high in fat and red meat and low in fiber.  Had childhood cancer that was treated with abdominal radiation. What are the signs or symptoms? Most polyps do not cause symptoms. If you have symptoms, they may include:  Blood coming from your rectum when having a bowel movement.  Blood in your stool. The stool may look dark red or black.  Abdominal pain.  A change in bowel habits, such as constipation or diarrhea. How is this diagnosed? This condition is diagnosed with a colonoscopy. This is a procedure in which a lighted,  flexible scope is inserted into the anus and then passed into the colon to examine the area. Polyps are sometimes found when a colonoscopy is done as part of routine cancer screening tests. How is this treated? Treatment for this condition involves removing any polyps that are found. Most polyps can be removed during a colonoscopy. Those polyps will then be tested for cancer. Additional treatment may be needed depending on the results of testing. Follow these instructions at home: Lifestyle  Maintain a healthy weight, or lose weight if recommended by your health care provider.  Exercise every day or as told by your health care provider.  Do not use any products that contain nicotine or tobacco, such as cigarettes and e-cigarettes. If you need help quitting, ask your health care provider.  If you drink alcohol, limit how much you have: ? 0-1 drink a day for women. ? 0-2 drinks a day for men.  Be aware of how much alcohol is in your drink. In the  [place] one drink equals one 12 oz bottle of beer (355 mL), one 5 oz glass of wine (148 mL), or one 1 oz shot of hard liquor (44 mL). Eating and drinking   Eat foods that are high in fiber, such as fruits, vegetables, and whole grains.  Eat foods that are high in calcium and vitamin D, such as milk, cheese, yogurt, eggs, liver, fish, and broccoli.  Limit foods that are high in fat, such as fried foods and desserts.  Limit the amount of red meat and processed meat you eat, such as hot dogs, sausage, bacon, and lunch meats. General instructions  Keep all follow-up visits as told by your health care provider. This is important. ? This includes having regularly scheduled colonoscopies. ? Talk to your health care provider about when you need a colonoscopy. Contact a health care provider if:  You have new or worsening bleeding during a bowel movement.  You have new or increased blood in your stool.  You have a change in bowel habits.  You lose  weight for no known reason. Summary  Polyps are tissue growths inside the body. Polyps can grow in many places, including the colon.  Most colon polyps are noncancerous (benign), but some can become cancerous over time.  This condition is diagnosed with a colonoscopy.  Treatment for this condition involves removing any polyps that are found. Most polyps can be removed during a colonoscopy. This information is not intended to replace advice given to you by your health care provider. Make sure you discuss any questions you have with your health care provider. Document Released: 12/10/2003 Document Revised: 06/30/2017 Document Reviewed: 06/30/2017 Elsevier Interactive Patient Education  2019 [employer].  Diverticulosis  Diverticulosis is a condition that develops when small pouches (diverticula) form in the wall of the large intestine (colon). The colon is where water is absorbed and stool is formed. The pouches form when the inside layer of the colon pushes through weak spots in the outer layers of the colon. You may have a few pouches or many of them. What are the causes? The cause of this condition is not known. What increases the risk? The following factors may make you more likely to develop this condition:  Being older than age 98. Your risk for this condition increases with age. Diverticulosis is rare among people younger than age 29. By age 73, many people have it.  Eating a low-fiber diet.  Having frequent constipation.  Being overweight.  Not getting enough exercise.  Smoking.  Taking over-the-counter pain medicines, like aspirin and ibuprofen.  Having a family history of diverticulosis. What are the signs or symptoms? In most people, there are no symptoms of this condition. If you do have symptoms, they may include:  Bloating.  Cramps in the abdomen.  Constipation or diarrhea.  Pain in the lower left side of the abdomen. How is this diagnosed? This condition  is most often diagnosed during an exam for other colon problems. Because diverticulosis usually has no symptoms, it often cannot be diagnosed independently. This condition may be diagnosed by:  Using a flexible scope to examine the colon (colonoscopy).  Taking an X-ray of the colon after dye has been put into the colon (barium enema).  Doing a CT scan. How is this treated? You may not need treatment for this condition if you have never developed an infection related to diverticulosis. If you have had an infection before, treatment may include:  Eating a high-fiber diet. This  may include eating more fruits, vegetables, and grains.  Taking a fiber supplement.  Taking a live bacteria supplement (probiotic).  Taking medicine to relax your colon.  Taking antibiotic medicines. Follow these instructions at home:  Drink 6-8 glasses of water or more each day to prevent constipation.  Try not to strain when you have a bowel movement.  If you have had an infection before: ? Eat more fiber as directed by your health care provider or your diet and nutrition specialist (dietitian). ? Take a fiber supplement or probiotic, if your health care provider approves.  Take over-the-counter and prescription medicines only as told by your health care provider.  If you were prescribed an antibiotic, take it as told by your health care provider. Do not stop taking the antibiotic even if you start to feel better.  Keep all follow-up visits as told by your health care provider. This is important. Contact a health care provider if:  You have pain in your abdomen.  You have bloating.  You have cramps.  You have not had a bowel movement in 3 days. Get help right away if:  Your pain gets worse.  Your bloating becomes very bad.  You have a fever or chills, and your symptoms suddenly get worse.  You vomit.  You have bowel movements that are bloody or black.  You have bleeding from your  rectum. Summary  Diverticulosis is a condition that develops when small pouches (diverticula) form in the wall of the large intestine (colon).  You may have a few pouches or many of them.  This condition is most often diagnosed during an exam for other colon problems.  If you have had an infection related to diverticulosis, treatment may include increasing the fiber in your diet, taking supplements, or taking medicines. This information is not intended to replace advice given to you by your health care provider. Make sure you discuss any questions you have with your health care provider. Document Released: 12/11/2003 Document Revised: 02/02/2016 Document Reviewed: 02/02/2016 Elsevier Interactive Patient Education  2019 [employer].   Colon polyp and diverticulosis information provided  Continue Protonix 40 mg daily  Office visit with [place] in 3 months  Further recommendations to follow via letter once pathology report returns for review

## 2018-04-24 NOTE — Op Note (Signed)
[facility] Patient Name: Monica Scott Procedure Date: 04/24/2018 8:30 AM MRN: [id number] Date of Birth: Jun 03, 1955 Attending MD: Norvel Richards , MD CSN: [id number] Age: 63 Admit Type: Outpatient Procedure:                Upper GI endoscopy Indications:              Dysphagia Providers:                Norvel Richards, MD, Jeanann Lewandowsky. Sharon Seller, RN,                            Nelma Rothman, Technician Referring MD:              Medicines:                Propofol per Anesthesia Complications:            No immediate complications. Estimated Blood Loss:     Estimated blood loss was minimal. Procedure:                Pre-Anesthesia Assessment:                           - Prior to the procedure, a History and Physical                            was performed, and patient medications and                            allergies were reviewed. The patient's tolerance of                            previous anesthesia was also reviewed. The risks                            and benefits of the procedure and the sedation                            options and risks were discussed with the patient.                            All questions were answered, and informed consent                            was obtained. Prior Anticoagulants: The patient has                            taken no previous anticoagulant or antiplatelet                            agents. ASA Grade Assessment: II - A patient with                            mild systemic disease. After reviewing the risks  and benefits, the patient was deemed in                            satisfactory condition to undergo the procedure.                           After obtaining informed consent, the endoscope was                            passed under direct vision. Throughout the                            procedure, the patient's blood pressure, pulse, and                            oxygen saturations were  monitored continuously. The                            GIF-H190 ([id number]) scope was introduced through the                            and advanced to the second part of duodenum. The                            upper GI endoscopy was accomplished without                            difficulty. The patient tolerated the procedure                            well. Scope In: 8:34:55 AM Scope Out: 8:41:56 AM Total Procedure Duration: 0 hours 7 minutes 1 second  Findings:      Distal one third of the tubular esophagus had somewhat of a ringed       appearance with's ribbonlike plaques. Some longitudinal furring. Tubular       esophagus appeared patent throughout its course. Somewhat patulous EG       junction. Moderate size hiatal hernia. Gastric mucosa appeared normal.      The scope was withdrawn. Dilation was performed with a Maloney dilator       with no resistance at 45 Fr. The scope was withdrawn. Dilation was       performed with a Maloney dilator with no resistance at 56 Fr. The       dilation site was examined following endoscope reinsertion and showed no       change. Estimated blood loss: none. This was biopsied with a cold       forceps for histology. Estimated blood loss was minimal.      The duodenal bulb and second portion of the duodenum were normal. Impression:               -Abnormal appearing esophagus?"distal?"of uncertain                            significance. Widely patent tubular esophagus.  Dilated and Biopsied. Query esophagitis dissecans                           - Normal duodenal bulb and second portion of the                            duodenum. Moderate Sedation:      Moderate (conscious) sedation was personally administered by an       anesthesia professional. The following parameters were monitored: oxygen       saturation, heart rate, blood pressure, respiratory rate, EKG, adequacy       of pulmonary ventilation, and response to  care. Recommendation:           - Patient has a contact number available for                            emergencies. The signs and symptoms of potential                            delayed complications were discussed with the                            patient. Return to normal activities tomorrow.                            Written discharge instructions were provided to the                            patient.                           - Resume previous diet.                           - No repeat upper endoscopy. Continue Protonix 40                            mg daily. Colonoscopy report. Follow-up on pathology                           - Return to GI office in 3 months. Procedure Code(s):        --- Professional ---                           [phone], Esophagogastroduodenoscopy, flexible,                            transoral; with biopsy, single or multiple                           43450, Dilation of esophagus, by unguided sound or                            bougie, single or multiple passes Diagnosis Code(s):        --- Professional ---  K22.8, Other specified diseases of esophagus                           R13.10, Dysphagia, unspecified CPT copyright 2018 American Medical Association. All rights reserved. The codes documented in this report are preliminary and upon coder review may  be revised to meet current compliance requirements. Cristopher Estimable. Dane Bloch, MD Norvel Richards, MD 04/24/2018 9:08:46 AM This report has been signed electronically. Number of Addenda: 0

## 2018-04-24 NOTE — Anesthesia Postprocedure Evaluation (Signed)
Anesthesia Post Note  Patient: Monica Scott  Procedure(s) Performed: COLONOSCOPY WITH PROPOFOL (N/A ) ESOPHAGOGASTRODUODENOSCOPY (EGD) WITH PROPOFOL (N/A ) MALONEY DILATION (N/A ) BIOPSY POLYPECTOMY  Patient location during evaluation: PACU Anesthesia Type: MAC Level of consciousness: awake and patient cooperative Pain management: pain level controlled Vital Signs Assessment: post-procedure vital signs reviewed and stable Respiratory status: spontaneous breathing, nonlabored ventilation and respiratory function stable Cardiovascular status: blood pressure returned to baseline Postop Assessment: no apparent nausea or vomiting Anesthetic complications: no     Last Vitals:  Vitals:   04/24/18 0914 04/24/18 0915  BP: 118/71   Pulse: 75 79  Resp: 18 18  Temp: 36.6 C   SpO2: 98% 99%    Last Pain:  Vitals:   04/24/18 0914  TempSrc:   PainSc: 0-No pain                 Vester Balthazor J

## 2018-04-24 NOTE — Transfer of Care (Signed)
Immediate Anesthesia Transfer of Care Note  Patient: Monica Scott  Procedure(s) Performed: COLONOSCOPY WITH PROPOFOL (N/A ) ESOPHAGOGASTRODUODENOSCOPY (EGD) WITH PROPOFOL (N/A ) MALONEY DILATION (N/A ) BIOPSY POLYPECTOMY  Patient Location: PACU  Anesthesia Type:MAC  Level of Consciousness: drowsy and patient cooperative  Airway & Oxygen Therapy: Patient Spontanous Breathing and Patient connected to nasal cannula oxygen  Post-op Assessment: Report given to RN and Post -op Vital signs reviewed and stable  Post vital signs: Reviewed and stable  Last Vitals:  Vitals Value Taken Time  BP    Temp    Pulse    Resp    SpO2      Last Pain:  Vitals:   04/24/18 0828  TempSrc:   PainSc: 0-No pain      Patients Stated Pain Goal: 8 (37/[id number]/32 [id number])  Complications: No apparent anesthesia complications

## 2018-04-24 NOTE — H&P (Signed)
@LOGO @   Primary Care Physician:  [name], MD Primary Gastroenterologist:  Dr. [name]  Pre-Procedure History & Physical: HPI:  Monica Scott is a 63 y.o. female here for here for further evaluation of dysphagia.  Longstanding GERD.  Positive family history of colon cancer in first-degree relative at a young age no prior colonoscopy.  Chronic diarrhea.  Past Medical History:  Diagnosis Date  . Arthritis   . Asthma   . Bladder disease   . COPD (chronic obstructive pulmonary disease) ([place])   . Depression   . GERD (gastroesophageal reflux disease)   . Hyperlipidemia   . Hypertension   . Peptic ulcer     Past Surgical History:  Procedure Laterality Date  . ABDOMINAL HYSTERECTOMY    . PILONIDAL CYST EXCISION     age 28    Prior to Admission medications   Medication Sig Start Date End Date Taking? Authorizing Provider  albuterol (PROVENTIL HFA;VENTOLIN HFA) 108 (90 Base) MCG/ACT inhaler Inhale 2 puffs into the lungs every 4 (four) hours as needed for wheezing or shortness of breath. 04/07/18  Yes Charlott Rakes, MD  albuterol (PROVENTIL) (2.5 MG/3ML) 0.083% nebulizer solution Take 3 mLs (2.5 mg total) by nebulization every 4 (four) hours as needed for wheezing or shortness of breath. 12/06/17  Yes Fulp, Cammie, MD  amLODipine (NORVASC) 5 MG tablet Take 1 tablet (5 mg total) by mouth daily. 12/06/17  Yes Fulp, Cammie, MD  Aspirin-Caffeine (BC FAST PAIN RELIEF PO) Take 1 packet by mouth daily as needed (pain).   Yes [provider]  FLUoxetine (PROZAC) 20 MG tablet Take 20 mg by mouth daily.   Yes [provider]  gabapentin (NEURONTIN) 400 MG capsule Take 1 capsule (400 mg total) by mouth 3 (three) times daily. 12/06/17  Yes Fulp, Cammie, MD  ibuprofen (ADVIL,MOTRIN) 200 MG tablet Take 400 mg by mouth 3 (three) times daily as needed for moderate pain.   Yes [provider]  levofloxacin (LEVAQUIN) 500 MG tablet Take 1 tablet (500 mg total) by mouth  daily. 04/07/18  Yes Charlott Rakes, MD  lisinopril (PRINIVIL,ZESTRIL) 20 MG tablet Take 1 tablet (20 mg total) by mouth 2 (two) times daily. 12/06/17  Yes Fulp, Cammie, MD  loperamide (IMODIUM A-D) 2 MG tablet Take 4 mg by mouth 2 (two) times daily as needed for diarrhea or loose stools.   Yes [provider]  mometasone-formoterol (DULERA) 100-5 MCG/ACT AERO Inhale 2 puffs into the lungs 2 (two) times daily. 12/06/17  Yes Fulp, Cammie, MD  OLANZapine (ZYPREXA) 5 MG tablet Take 5 mg by mouth at bedtime.   Yes [provider]  pantoprazole (PROTONIX) 40 MG tablet Take 1 tablet (40 mg total) by mouth daily. Patient taking differently: Take 40 mg by mouth daily as needed (acid reflux).  12/06/17  Yes Fulp, Cammie, MD  predniSONE (DELTASONE) 20 MG tablet Take 1 tablet (20 mg total) by mouth 2 (two) times daily with a meal. First dose 04/08/18 04/07/18  Yes Charlott Rakes, MD    Allergies as of 03/01/2018  . (No Known Allergies)    Family History  Problem Relation Age of Onset  . Alcohol abuse Mother   . Arthritis Mother   . Cancer Mother   . Depression Mother   . Early death Mother   . Alcohol abuse Father   . Depression Father   . Alcohol abuse Sister   . Depression Sister   . Alcohol abuse Brother   . Alcohol  abuse Daughter   . Depression Daughter   . Alcohol abuse Son   . COPD Maternal Grandfather   . Heart disease Maternal Grandfather   . Hypertension Maternal Grandfather   . Colon cancer Brother        deceased at age 79    Social History   Socioeconomic History  . Marital status: Single    Spouse name: Not on file  . Number of children: Not on file  . Years of education: Not on file  . Highest education level: Not on file  Occupational History  . Not on file  Social Needs  . Financial resource strain: Not on file  . Food insecurity:    Worry: Not on file    Inability: Not on file  . Transportation needs:    Medical: Not on file    Non-medical: Not  on file  Tobacco Use  . Smoking status: Former Smoker    Packs/day: 1.50    Years: 40.00    Pack years: 60.00    Types: Cigarettes    Last attempt to quit: 04/19/2010    Years since quitting: 8.0  . Smokeless tobacco: Never Used  Substance and Sexual Activity  . Alcohol use: No    Comment: Former ETOH. none since 2000  . Drug use: No  . Sexual activity: Yes    Birth control/protection: Surgical  Lifestyle  . Physical activity:    Days per week: Not on file    Minutes per session: Not on file  . Stress: Not on file  Relationships  . Social connections:    Talks on phone: Not on file    Gets together: Not on file    Attends religious service: Not on file    Active member of club or organization: Not on file    Attends meetings of clubs or organizations: Not on file    Relationship status: Not on file  . Intimate partner violence:    Fear of current or ex partner: Not on file    Emotionally abused: Not on file    Physically abused: Not on file    Forced sexual activity: Not on file  Other Topics Concern  . Not on file  Social History Narrative  . Not on file    Review of Systems: See HPI, otherwise negative ROS  Physical Exam: BP 135/76   Pulse 70   Temp 98.4 F (36.9 C) (Oral)   Resp 16   SpO2 92%  General:   Alert,  Well-developed, well-nourished, pleasant and cooperative in NAD Neck:  Supple; no masses or thyromegaly. No significant cervical adenopathy. Lungs:  Clear throughout to auscultation.   No wheezes, crackles, or rhonchi. No acute distress. Heart:  Regular rate and rhythm; no murmurs, clicks, rubs,  or gallops. Abdomen: Non-distended, normal bowel sounds.  Soft and nontender without appreciable mass or hepatosplenomegaly.  Pulses:  Normal pulses noted. Extremities:  Without clubbing or edema.  Impression/Plan: Pleasant 63 year old lady with family history of colon cancer here for first ever however screening colonoscopy.  Chronic diarrhea.  GERD with  esophageal dysphagia. I have offered the patient a EGD with ED and colonoscopy today per plan. The risks, benefits, limitations, imponderables and alternatives regarding both EGD and colonoscopy have been reviewed with the patient. Questions have been answered. All parties agreeable.     Notice: This dictation was prepared with Dragon dictation along with smaller phrase technology. Any transcriptional errors that result from this process are unintentional and  may not be corrected upon review.

## 2018-04-24 NOTE — Op Note (Signed)
[facility] Patient Name: Monica Scott Procedure Date: 04/24/2018 8:46 AM MRN: [id number] Date of Birth: 05-31-1955 Attending MD: Norvel Richards , MD CSN: [id number] Age: 63 Admit Type: Outpatient Procedure:                Colonoscopy Indications:              Screening in patient at increased risk: Family                            history of 1st-degree relative with colorectal                            cancer Providers:                Norvel Richards, MD, Otis Peak B. Sharon Seller, RN,                            Nelma Rothman, Technician Referring MD:             [name] Medicines:                Propofol per Anesthesia Complications:            No immediate complications. No immediate                            complications. Estimated blood loss: Minimal. Estimated Blood Loss:     Estimated blood loss was minimal. Procedure:                Pre-Anesthesia Assessment:                           - Prior to the procedure, a History and Physical                            was performed, and patient medications and                            allergies were reviewed. The patient's tolerance of                            previous anesthesia was also reviewed. The risks                            and benefits of the procedure and the sedation                            options and risks were discussed with the patient.                            All questions were answered, and informed consent                            was obtained. Prior Anticoagulants: The patient has  taken no previous anticoagulant or antiplatelet                            agents. ASA Grade Assessment: II - A patient with                            mild systemic disease. After reviewing the risks                            and benefits, the patient was deemed in                            satisfactory condition to undergo the procedure.                           After obtaining  informed consent, the colonoscope                            was passed under direct vision. Throughout the                            procedure, the patient's blood pressure, pulse, and                            oxygen saturations were monitored continuously. The                            [id number]-[id number] ([id number]) scope was introduced through                            the and advanced to the the cecum, identified by                            appendiceal orifice and ileocecal valve. The                            colonoscopy was performed without difficulty. The                            patient tolerated the procedure well. The quality                            of the bowel preparation was adequate. The                            ileocecal valve, appendiceal orifice, and rectum                            were photographed. Scope In: 8:48:16 AM Scope Out: 9:05:39 AM Scope Withdrawal Time: 0 hours 10 minutes 53 seconds  Total Procedure Duration: 0 hours 17 minutes 23 seconds  Findings:      The perianal and digital rectal examinations were normal.      Six sessile polyps were found in the sigmoid colon, hepatic flexure  and       ileocecal valve. The polyps were 4 to 7 mm in size. These polyps were       removed with a cold snare. Resection and retrieval were complete.       Estimated blood loss was minimal.      Multiple small and large-mouthed diverticula were found in the sigmoid       colon and descending colon.      The exam was otherwise without abnormality on direct and retroflexion       views. Impression:               - Six 4 to 7 mm polyps in the sigmoid colon, at the                            hepatic flexure and at the ileocecal valve, removed                            with a cold snare. Resected and retrieved.                           - Diverticulosis in the sigmoid colon and in the                            descending colon.                           - The examination  was otherwise normal on direct                            and retroflexion views. Moderate Sedation:      Moderate (conscious) sedation was personally administered by an       anesthesia professional. The following parameters were monitored: oxygen       saturation, heart rate, blood pressure, respiratory rate, EKG, adequacy       of pulmonary ventilation, and response to care. Recommendation:           - Patient has a contact number available for                            emergencies. The signs and symptoms of potential                            delayed complications were discussed with the                            patient. Return to normal activities tomorrow.                            Written discharge instructions were provided to the                            patient.                           - Advance diet as tolerated today.                           -  Continue present medications.                           - Patient has a contact number available for                            emergencies. The signs and symptoms of potential                            delayed complications were discussed with the                            patient. Return to normal activities tomorrow.                            Written discharge instructions were provided to the                            patient.                           - Repeat colonoscopy date to be determined after                            pending pathology results are reviewed for                            surveillance based on pathology results.                           - Return to GI clinic in 3 months. Procedure Code(s):        --- Professional ---                           [id number], Colonoscopy, flexible; with removal of                            tumor(s), polyp(s), or other lesion(s) by snare                            technique Diagnosis Code(s):        --- Professional ---                           Z80.0, Family history  of malignant neoplasm of                            digestive organs                           D12.5, Benign neoplasm of sigmoid colon                           D12.3, Benign neoplasm of transverse colon (hepatic  flexure or splenic flexure)                           D12.0, Benign neoplasm of cecum                           K57.30, Diverticulosis of large intestine without                            perforation or abscess without bleeding CPT copyright 2018 [employer]. All rights reserved. The codes documented in this report are preliminary and upon coder review may  be revised to meet current compliance requirements. Cristopher Estimable. Joangel Vanosdol, MD Norvel Richards, MD 04/24/2018 9:16:40 AM This report has been signed electronically. Number of Addenda: 0

## 2018-04-24 NOTE — Anesthesia Preprocedure Evaluation (Signed)
Anesthesia Evaluation    Airway        Dental  (+) Poor Dentition, Chipped, Missing, Loose   Pulmonary shortness of breath, with exertion, at rest and Long-Term Oxygen Therapy, asthma , COPD,  COPD inhaler and oxygen dependent, former smoker,           Cardiovascular hypertension, On Medications      Neuro/Psych PSYCHIATRIC DISORDERS Depression    GI/Hepatic PUD, GERD  ,  Endo/Other    Renal/GU      Musculoskeletal   Abdominal   Peds  Hematology   Anesthesia Other Findings 60-plus pk yr hx, quit 2012 H/O acute resp failure, with hypoxemia, setting COPD Oxygen dependent at home, extertional dyspnea with limited exercise Dentition in poor state of repair.  Upper teeth carried, broken at gum line, active carries.  Lower teeth chipped, broken  Reproductive/Obstetrics                             Anesthesia Physical Anesthesia Plan  ASA: IV  Anesthesia Plan: MAC   Post-op Pain Management:    Induction:   PONV Risk Score and Plan:   Airway Management Planned:   Additional Equipment:   Intra-op Plan:   Post-operative Plan:   Informed Consent: I have reviewed the patients History and Physical, chart, labs and discussed the procedure including the risks, benefits and alternatives for the proposed anesthesia with the patient or authorized representative who has indicated his/her understanding and acceptance.     Dental Advisory Given  Plan Discussed with: Anesthesiologist  Anesthesia Plan Comments:         Anesthesia Quick Evaluation

## 2018-04-27 ENCOUNTER — Encounter (HOSPITAL_COMMUNITY): Payer: Self-pay | Admitting: Internal Medicine

## 2018-04-30 ENCOUNTER — Encounter: Payer: Self-pay | Admitting: Internal Medicine

## 2018-05-01 ENCOUNTER — Other Ambulatory Visit: Payer: Self-pay

## 2018-05-01 ENCOUNTER — Telehealth: Payer: Self-pay | Admitting: Internal Medicine

## 2018-05-01 ENCOUNTER — Telehealth: Payer: Self-pay

## 2018-05-01 ENCOUNTER — Encounter: Payer: Self-pay | Admitting: Internal Medicine

## 2018-05-01 MED ORDER — FLUCONAZOLE 100 MG PO TABS: ORAL_TABLET | ORAL | 0 refills | Status: DC

## 2018-05-01 NOTE — Telephone Encounter (Signed)
Pt said she was returning a call from AM. Please call her back at [phone]

## 2018-05-01 NOTE — Telephone Encounter (Signed)
Lmom, waiting on a return call to discuss medication needed per RMR s/p procedure.

## 2018-05-01 NOTE — Telephone Encounter (Signed)
Lmom, waiting on a return call.  

## 2018-05-10 MED FILL — GABAPENTIN 400 MG CAPSULE: 400 | 30 days supply | Qty: 90 | Fill #4

## 2018-05-11 ENCOUNTER — Ambulatory Visit: Payer: Self-pay | Attending: Family Medicine | Admitting: Family Medicine

## 2018-05-11 ENCOUNTER — Encounter: Payer: Self-pay | Admitting: Family Medicine

## 2018-05-11 ENCOUNTER — Telehealth: Payer: Self-pay

## 2018-05-11 VITALS — BP 113/73 | HR 80 | Temp 98.2°F | Resp 18 | Ht 59.0 in | Wt 160.0 lb

## 2018-05-11 DIAGNOSIS — R918 Other nonspecific abnormal finding of lung field: Secondary | ICD-10-CM

## 2018-05-11 DIAGNOSIS — J449 Chronic obstructive pulmonary disease, unspecified: Secondary | ICD-10-CM

## 2018-05-11 DIAGNOSIS — K219 Gastro-esophageal reflux disease without esophagitis: Secondary | ICD-10-CM

## 2018-05-11 DIAGNOSIS — R1013 Epigastric pain: Secondary | ICD-10-CM

## 2018-05-11 DIAGNOSIS — R339 Retention of urine, unspecified: Secondary | ICD-10-CM

## 2018-05-11 MED ORDER — OMEPRAZOLE 40 MG PO CPDR: 40.0000 mg | DELAYED_RELEASE_CAPSULE | Freq: Two times a day (BID) | ORAL | 3 refills | Status: DC

## 2018-05-11 MED FILL — OMEPRAZOLE DR 40 MG CAPSULE: 40 | 30 days supply | Qty: 60 | Fill #0

## 2018-05-11 NOTE — Telephone Encounter (Signed)
Met with the patient when she was in the clinic today and provided her with samples of straight intermittent catheters 14 Fr  from [employer]. She was very Patent attorney.  [employer]net[employer] Hutching representative also met with the patient and explained their program for providing urological supplies. The patient stated that she has submitted a medicaid application and is waiting for a determination. Annie Main explained that he will provide her with samples for the next month as she awaits medicaid decision.  He said that [facility] will be contacting her to confirm the type of catheter that she prefers.  She was agreeable to sharing needed clinical and demographic information with [employer].

## 2018-05-11 NOTE — Telephone Encounter (Signed)
Prescription for straight catheters faxed to [facility] - urology

## 2018-05-11 NOTE — Progress Notes (Signed)
Subjective:    Patient ID: Monica Scott, female    DOB: 12-02-1955, 63 y.o.   MRN: [id number]  HPI       63 yo female who is seen in follow-up of on CXR on 01/29/18 when patient was seen at [facility] due to cough and chest congestion. CT scan was recommended for further evaluation. Patient was seen here in the office on 04/07/2018 for COPD exacerbation and was placed on Levaquin and prednisone. Chest CT was ordered at that Time. Patient needs to find out when her CT is scheduled as she has not yet been scheduled. Patient reports that her symptoms of COPD exacerbation have resolved. No cough or chest congestion. Patient stopped smoking 8 years ago.       Patient is s/p recent EGD and colonoscopy and she complains of her "ulcer" hurting. Patient with pain in her upper mid-abdomen-dull ache. No blood in the stool and no dark or black stools. Patient is on pantoprazole which she does not feel is working.       She would also like to know how she can obtain bladder catheters and she has had issues with urinary retention.  Patient has had to reuse the same catheter multiple times as she has had difficulty obtaining new catheters due to her lack of insurance.  Past Medical History:  Diagnosis Date  . Arthritis   . Asthma   . Bladder disease   . COPD (chronic obstructive pulmonary disease) ([place])   . Depression   . GERD (gastroesophageal reflux disease)   . Hyperlipidemia   . Hypertension   . Peptic ulcer    Past Surgical History:  Procedure Laterality Date  . ABDOMINAL HYSTERECTOMY    . BIOPSY  04/24/2018   Procedure: BIOPSY;  Surgeon: Daneil Dolin, MD;  [facility];  Service: Endoscopy;;  esophagus  . COLONOSCOPY WITH PROPOFOL N/A 04/24/2018   Procedure: COLONOSCOPY WITH PROPOFOL;  Surgeon: Daneil Dolin, MD;  [facility];  Service: Endoscopy;  Laterality: N/A;  9:00am  . ESOPHAGOGASTRODUODENOSCOPY (EGD) WITH PROPOFOL N/A 04/24/2018   Procedure:  ESOPHAGOGASTRODUODENOSCOPY (EGD) WITH PROPOFOL;  Surgeon: Daneil Dolin, MD;  [facility];  Service: Endoscopy;  Laterality: N/A;  Venia Minks DILATION N/A 04/24/2018   Procedure: Venia Minks DILATION;  Surgeon: Daneil Dolin, MD;  [facility];  Service: Endoscopy;  Laterality: N/A;  . PILONIDAL CYST EXCISION     age 14  . POLYPECTOMY  04/24/2018   Procedure: POLYPECTOMY;  Surgeon: Daneil Dolin, MD;  [facility];  Service: Endoscopy;;  colon    Family History  Problem Relation Age of Onset  . Alcohol abuse Mother   . Arthritis Mother   . Cancer Mother   . Depression Mother   . Early death Mother   . Alcohol abuse Father   . Depression Father   . Alcohol abuse Sister   . Depression Sister   . Alcohol abuse Brother   . Alcohol abuse Daughter   . Depression Daughter   . Alcohol abuse Son   . COPD Maternal Grandfather   . Heart disease Maternal Grandfather   . Hypertension Maternal Grandfather   . Colon cancer Brother        deceased at age 35   Social History   Tobacco Use  . Smoking status: Former Smoker    Packs/day: 1.50    Years: 40.00    Pack years: 60.00  Types: Cigarettes    Last attempt to quit: 04/19/2010    Years since quitting: 8.3  . Smokeless tobacco: Never Used  Substance Use Topics  . Alcohol use: No    Comment: Former ETOH. none since 2000  . Drug use: No   No Known Allergies  Review of Systems  Constitutional: Positive for fatigue. Negative for chills and fever.  HENT: Positive for trouble swallowing (Occasional). Negative for sore throat.   Respiratory: Positive for shortness of breath (Occasional). Negative for cough.   Cardiovascular: Negative for chest pain, palpitations and leg swelling.  Gastrointestinal: Positive for abdominal pain (Recurrent, mild, epigastric discomfort). Negative for blood in stool, constipation, diarrhea and nausea.  Endocrine: Negative for polydipsia, polyphagia and polyuria.    Genitourinary: Positive for difficulty urinating (Longstanding issues requiring self-catheterization at times). Negative for dysuria and frequency.  Musculoskeletal: Positive for back pain (Occasional). Negative for arthralgias and gait problem.  Neurological: Negative for dizziness and headaches.  Hematological: Negative for adenopathy. Does not bruise/bleed easily.       Objective:   Physical Exam BP 113/73 (BP Location: Left Arm, Patient Position: Sitting, Cuff Size: Normal)   Pulse 80   Temp 98.2 F (36.8 C) (Oral)   Resp 18   Ht _0  (1.499 m)   Wt 160 lb (72.6 kg)   SpO2 92% Comment: 89% Low  BMI 32.32 kg/m Nurse's notes and vital signs reviewed General-well-nourished, well-developed older female in no acute distress Neck-supple, no lymphadenopathy Lungs- patient with mild decreased air movement in all lung fields, no increased work of breathing, clear to auscultation bilaterally with no active wheezing, rhonchi or rales Cardiovascular-regular rate and rhythm Abdomen-soft, patient with mild epigastric discomfort to palpation, no rebound or guarding Back-no CVA tenderness Extremities-no edema Psych-normal mood and judgment      Assessment & Plan:  1. Abnormal findings on diagnostic imaging of lung Patient is scheduled for CT chest on 05/18/2018 in follow-up of abnormal chest x-ray due to a nodular density of 17 mm which was seen at the right lung base on chest x-ray.  Per chest x-ray, report this could represent pulmonary nodule, focus of infection or atelectasis.  2. Chronic obstructive pulmonary disease, unspecified COPD type ([place]) Continue use of Dulera.  Use albuterol as needed for shortness of breath.  Patient is scheduled for upcoming chest CT in follow-up of abnormality on chest x-ray.  Continue to refrain from use of cigarettes or tobacco products.  3. Epigastric pain Patient status post EGD 04/24/2018 and was noted to have possible esophagitis as well as moderate  hiatal hernia.  Patient reports continued epigastric pain and she does not feel that her current Protonix is helping.  Patient will be placed on omeprazole 40 mg twice daily.  She should avoid known trigger foods, avoid late night eating as well as avoidance of alcohol and nostril anti-inflammatories.  She is asked to call GI for further follow-up as she continues to have epigastric pain and reflux symptoms status post recent EGD. - omeprazole (PRILOSEC) 40 MG capsule; Take 1 capsule (40 mg total) by mouth 2 (two) times daily. To reduce stomach acid  Dispense: 60 capsule; Refill: 3  4. Gastroesophageal reflux disease, esophagitis presence not specified She is status post EGD on 04/24/2018 per GI.  Patient with possible esophagitis.  Patient is to follow-up in 3 months.  Patient was placed on Protonix per GI but she does not feel that this is effective therefore she will be placed on omeprazole 40  mg twice daily and she has asked to call GI regarding sooner follow-up secondary to continued epigastric pain and reflux symptoms.  Patient should avoid the use of alcohol as well as avoidance of use of nonsteroidal anti-inflammatories which may worsen reflux symptoms, increased risk of gastritis and GI bleed. - omeprazole (PRILOSEC) 40 MG capsule; Take 1 capsule (40 mg total) by mouth 2 (two) times daily. To reduce stomach acid  Dispense: 60 capsule; Refill: 3  5. Urinary retention Patient with longstanding issues with urinary retention and has had difficulty obtaining supplies for self-catheterization due to lack of insurance.  Clinic nurse coordinator was able to obtain catheter samples from a visiting product representative at today's visit which she was able to provide to the patient at no cost.  She also met with the patient to see if there are resources to help patient obtain supplies on a regular basis.  An After Visit Summary was printed and given to the patient.  Allergies as of 05/11/2018   No Known  Allergies     Medication List       Accurate as of May 11, 2018 11:59 PM. If you have any questions, ask your nurse or doctor.        STOP taking these medications   pantoprazole 40 MG tablet Commonly known as:  PROTONIX Stopped by:  [name], MD   predniSONE 20 MG tablet Commonly known as:  DELTASONE Stopped by:  [name], MD     TAKE these medications   albuterol (2.5 MG/3ML) 0.083% nebulizer solution Commonly known as:  PROVENTIL Take 3 mLs (2.5 mg total) by nebulization every 4 (four) hours as needed for wheezing or shortness of breath.   albuterol 108 (90 Base) MCG/ACT inhaler Commonly known as:  VENTOLIN HFA Inhale 2 puffs into the lungs every 4 (four) hours as needed for wheezing or shortness of breath.   amLODipine 5 MG tablet Commonly known as:  NORVASC Take 1 tablet (5 mg total) by mouth daily.   BC FAST PAIN RELIEF PO Take 1 packet by mouth daily as needed (pain).   fluconazole 100 MG tablet Commonly known as:  Diflucan Take 2 pills po (200 mg) on the first day and 1 pill po daily x 21 days.   FLUoxetine 20 MG tablet Commonly known as:  PROZAC Take 20 mg by mouth daily.   gabapentin 400 MG capsule Commonly known as:  NEURONTIN Take 1 capsule (400 mg total) by mouth 3 (three) times daily.   ibuprofen 200 MG tablet Commonly known as:  ADVIL Take 400 mg by mouth 3 (three) times daily as needed for moderate pain.   levofloxacin 500 MG tablet Commonly known as:  LEVAQUIN Take 1 tablet (500 mg total) by mouth daily.   lisinopril 20 MG tablet Commonly known as:  ZESTRIL Take 1 tablet (20 mg total) by mouth 2 (two) times daily.   loperamide 2 MG tablet Commonly known as:  IMODIUM A-D Take 4 mg by mouth 2 (two) times daily as needed for diarrhea or loose stools.   mometasone-formoterol 100-5 MCG/ACT Aero Commonly known as:  DULERA Inhale 2 puffs into the lungs 2 (two) times daily.   OLANZapine 5 MG tablet Commonly known as:   ZYPREXA Take 5 mg by mouth at bedtime.   omeprazole 40 MG capsule Commonly known as:  PRILOSEC Take 1 capsule (40 mg total) by mouth 2 (two) times daily. To reduce stomach acid Started by:  [name], MD  Return in about 4 weeks (around 06/08/2018) for CT results/GERD.

## 2018-05-12 MED FILL — ALBUTEROL SUL 2.5 MG/3 ML S: (2.5 MG/3ML | 20 days supply | Qty: 360 | Fill #2

## 2018-05-18 ENCOUNTER — Ambulatory Visit (HOSPITAL_COMMUNITY)
Admission: RE | Admit: 2018-05-18 | Discharge: 2018-05-18 | Disposition: A | Discharge status: Home / Self Care | Payer: Medicaid Other | Source: Ambulatory Visit | Attending: Family Medicine | Admitting: Family Medicine

## 2018-05-18 DIAGNOSIS — R918 Other nonspecific abnormal finding of lung field: Secondary | ICD-10-CM | POA: Insufficient documentation

## 2018-05-18 MED ORDER — IOPAMIDOL (ISOVUE-370) INJECTION 76%
75.0000 mL | Freq: Once | INTRAVENOUS | Status: AC | PRN
  Administered 2018-05-18: 75 mL via INTRAVENOUS

## 2018-05-19 ENCOUNTER — Encounter: Payer: Self-pay | Admitting: Family Medicine

## 2018-05-19 DIAGNOSIS — I77810 Thoracic aortic ectasia: Secondary | ICD-10-CM | POA: Insufficient documentation

## 2018-05-24 MED FILL — LISINOPRIL 20 MG TAB: 20 | 30 days supply | Qty: 60 | Fill #2

## 2018-05-24 MED FILL — AMLODIPINE BESYLATE 5 MG TA: 5 | 30 days supply | Qty: 30 | Fill #2

## 2018-06-07 ENCOUNTER — Ambulatory Visit: Payer: Self-pay | Attending: Family Medicine

## 2018-06-07 ENCOUNTER — Other Ambulatory Visit: Payer: Self-pay

## 2018-06-13 ENCOUNTER — Telehealth: Payer: Self-pay

## 2018-06-13 NOTE — Telephone Encounter (Signed)
Call received from [facility] urology noting that he has not been able to reach the patient to inquire if her medicaid has been approved and/or if she is in need of more straight catheters.  Informed him that the patient has an appointment at [facility] tomorrow - 06/14/2018. This CM to check with patient about status of catheters/ medicaid if she comes to her appointment

## 2018-06-14 ENCOUNTER — Ambulatory Visit: Payer: Self-pay | Attending: Family Medicine | Admitting: Family Medicine

## 2018-06-14 ENCOUNTER — Ambulatory Visit (HOSPITAL_BASED_OUTPATIENT_CLINIC_OR_DEPARTMENT_OTHER): Payer: Self-pay | Admitting: Licensed Clinical Social Worker

## 2018-06-14 ENCOUNTER — Telehealth: Payer: Self-pay

## 2018-06-14 ENCOUNTER — Encounter: Payer: Self-pay | Admitting: Family Medicine

## 2018-06-14 ENCOUNTER — Other Ambulatory Visit: Payer: Self-pay

## 2018-06-14 VITALS — BP 129/83 | HR 85 | Temp 99.8°F | Resp 18 | Ht 59.0 in | Wt 165.0 lb

## 2018-06-14 DIAGNOSIS — I77810 Thoracic aortic ectasia: Secondary | ICD-10-CM

## 2018-06-14 DIAGNOSIS — F439 Reaction to severe stress, unspecified: Secondary | ICD-10-CM

## 2018-06-14 DIAGNOSIS — I2510 Atherosclerotic heart disease of native coronary artery without angina pectoris: Secondary | ICD-10-CM | POA: Insufficient documentation

## 2018-06-14 DIAGNOSIS — J441 Chronic obstructive pulmonary disease with (acute) exacerbation: Secondary | ICD-10-CM

## 2018-06-14 DIAGNOSIS — I700 Atherosclerosis of aorta: Secondary | ICD-10-CM | POA: Insufficient documentation

## 2018-06-14 DIAGNOSIS — J439 Emphysema, unspecified: Secondary | ICD-10-CM | POA: Insufficient documentation

## 2018-06-14 MED ORDER — PREDNISONE 20 MG PO TABS: ORAL_TABLET | ORAL | 0 refills | Status: DC

## 2018-06-14 MED ORDER — AZITHROMYCIN 250 MG PO TABS: ORAL_TABLET | ORAL | 0 refills | Status: DC

## 2018-06-14 MED ORDER — GUAIFENESIN-CODEINE 100-10 MG/5ML PO SOLN: 5.0000 mL | Freq: Three times a day (TID) | ORAL | 0 refills | Status: DC | PRN

## 2018-06-14 MED FILL — predniSONE 20 MG TABS: 20 | 5 days supply | Qty: 10 | Fill #0

## 2018-06-14 MED FILL — GUAIATUSSIN AC LIQUID: 100-10 | 8 days supply | Qty: 120 | Fill #0

## 2018-06-14 MED FILL — AZITHROMYCIN 250 MG TABLET: 250 | 5 days supply | Qty: 6 | Fill #0

## 2018-06-14 NOTE — Progress Notes (Signed)
43Established Patient Office Visit  Subjective:  Patient ID: Monica Scott, female    DOB: 27-Apr-1955  Age: 63 y.o. MRN: [id number]  CC:  Increased cough and SOB for 2-3 days Chief Complaint  Patient presents with   Follow-up    COPD    HPI Monica Scott presents for 2 to 3 days of increased cough, nonproductive along with increased shortness of breath.  Patient with some chest tightness secondary to shortness of breath and coughing.  Patient states that the cough is keeping her awake at night.  She denies any nasal congestion, no runny nose, no sore throat, no ear pain.  Patient denies any fever, chills.  Patient does have issues with chronic low back pain with some mild recent increase.  Patient with more frequent use of her albuterol inhaler and albuterol nebulizer.  Patient reports that she wears her oxygen at night and when she can when she is at home.  Patient continues to work and is currently working 10-hour days at [employer] and patient does not use oxygen while at work.  Patient reports increased difficulty with shortness of breath with walking.  Patient denies any chest pain or peripheral edema.  Patient also reports that she has not yet received information regarding her recent lung CT scan which was done in follow-up of nodule seen on chest x-ray.  Past Medical History:  Diagnosis Date   Arthritis    Asthma    Bladder disease    COPD (chronic obstructive pulmonary disease) ([facility])    Depression    GERD (gastroesophageal reflux disease)    Hyperlipidemia    Hypertension    Peptic ulcer     Past Surgical History:  Procedure Laterality Date   ABDOMINAL HYSTERECTOMY     BIOPSY  04/24/2018   Procedure: BIOPSY;  Surgeon: Daneil Dolin, MD;  [facility];  Service: Endoscopy;;  esophagus   COLONOSCOPY WITH PROPOFOL N/A 04/24/2018   Procedure: COLONOSCOPY WITH PROPOFOL;  Surgeon: Daneil Dolin, MD;  [facility];  Service: Endoscopy;   Laterality: N/A;  9:00am   ESOPHAGOGASTRODUODENOSCOPY (EGD) WITH PROPOFOL N/A 04/24/2018   Procedure: ESOPHAGOGASTRODUODENOSCOPY (EGD) WITH PROPOFOL;  Surgeon: Daneil Dolin, MD;  [facility];  Service: Endoscopy;  Laterality: N/A;   MALONEY DILATION N/A 04/24/2018   Procedure: Venia Minks DILATION;  Surgeon: Daneil Dolin, MD;  [facility];  Service: Endoscopy;  Laterality: N/A;   PILONIDAL CYST EXCISION     age 37   POLYPECTOMY  04/24/2018   Procedure: POLYPECTOMY;  Surgeon: Daneil Dolin, MD;  [facility];  Service: Endoscopy;;  colon     Family History  Problem Relation Age of Onset   Alcohol abuse Mother    Arthritis Mother    Cancer Mother    Depression Mother    Early death Mother    Alcohol abuse Father    Depression Father    Alcohol abuse Sister    Depression Sister    Alcohol abuse Brother    Alcohol abuse Daughter    Depression Daughter    Alcohol abuse Son    COPD Maternal Grandfather    Heart disease Maternal Grandfather    Hypertension Maternal Grandfather    Colon cancer Brother        deceased at age 56    Social History   Tobacco Use   Smoking status: Former Smoker    Packs/day: 1.50    Years:  40.00    Pack years: 60.00    Types: Cigarettes    Last attempt to quit: 04/19/2010    Years since quitting: 8.1   Smokeless tobacco: Never Used  Substance Use Topics   Alcohol use: No    Comment: Former ETOH. none since 2000   Drug use: No    No Known Allergies    ROS Review of Systems  Constitutional: Positive for fatigue. Negative for chills and fever.  HENT: Negative for congestion, postnasal drip, rhinorrhea, sinus pressure, sinus pain, sneezing and sore throat.   Respiratory: Positive for cough, chest tightness, shortness of breath and wheezing.   Cardiovascular: Negative for chest pain, palpitations and leg swelling.  Gastrointestinal: Negative for abdominal pain, constipation,  diarrhea and nausea.  Endocrine: Negative for polydipsia, polyphagia and polyuria.  Genitourinary: Negative for dysuria and frequency.  Musculoskeletal: Positive for back pain and gait problem.  Neurological: Negative for dizziness and headaches.  Hematological: Negative for adenopathy. Does not bruise/bleed easily.      Objective:    Physical Exam  Constitutional: She is oriented to person, place, and time. She appears well-developed and well-nourished. No distress.  No acute distress while sitting but some pursed lip breathing with ambulation  HENT:  Head: Normocephalic and atraumatic.  Right Ear: Hearing, tympanic membrane, external ear and ear canal normal.  Left Ear: Hearing, tympanic membrane, external ear and ear canal normal.  Nose: Mucosal edema present. No rhinorrhea.  Mouth/Throat: Posterior oropharyngeal edema (mild) and posterior oropharyngeal erythema (mild) present.  Cardiovascular: Normal rate and regular rhythm.  Pulmonary/Chest: Effort normal and breath sounds normal. She has no wheezes.  Decreased air movement/chest tightness  Abdominal: Soft. She exhibits distension (mild). There is no abdominal tenderness. There is no rebound and no guarding.  Musculoskeletal:        General: Tenderness (mild lumbosacral tenderness) present. No edema.  Neurological: She is alert and oriented to person, place, and time.  Skin: Skin is warm and dry.    BP 129/83 (BP Location: Left Arm, Patient Position: Sitting, Cuff Size: Normal)    Pulse 85    Temp 99.8 F (37.7 C) (Oral)    Resp 18    Ht 4\' 11"  (1.499 m)    Wt 165 lb (74.8 kg)    SpO2 93%    BMI 33.33 kg/m  Wt Readings from Last 3 Encounters:  06/14/18 165 lb (74.8 kg)  05/11/18 160 lb (72.6 kg)  04/19/18 165 lb (74.8 kg)     Health Maintenance Due  Topic Date Due   Hepatitis C Screening  03/[birth year]   TETANUS/TDAP  09/29/1974   PAP SMEAR-Modifier  09/28/1976   MAMMOGRAM  12/01/2014     Lab Results  Component  Value Date   WBC 11.7 (H) 04/19/2018   HGB 12.4 04/19/2018   HCT 40.3 04/19/2018   MCV 91.0 04/19/2018   PLT 386 04/19/2018   Lab Results  Component Value Date   NA 140 04/19/2018   K 3.5 04/19/2018   CO2 27 04/19/2018   GLUCOSE 122 (H) 04/19/2018   BUN 8 04/19/2018   CREATININE 0.74 04/19/2018   BILITOT 0.2 12/06/2017   ALKPHOS 128 (H) 12/06/2017   AST 14 12/06/2017   ALT 12 12/06/2017   PROT 6.2 12/06/2017   ALBUMIN 4.2 12/06/2017   CALCIUM 8.8 (L) 04/19/2018   ANIONGAP 10 04/19/2018   Lab Results  Component Value Date   CHOL 249 (H) 10/18/2017   Lab Results  Component Value Date  HDL 64 10/18/2017   Lab Results  Component Value Date   LDLCALC 164 (H) 10/18/2017   Lab Results  Component Value Date   TRIG 106 10/18/2017   Lab Results  Component Value Date   CHOLHDL 3.9 10/18/2017   Lab Results  Component Value Date   HGBA1C 6.1 (H) 10/18/2017      Assessment & Plan:  1. COPD with acute exacerbation [facility]) Patient with COPD exacerbation.  Prescription provided for prednisone 20 mg to take 2 tablets once daily for 5 days and azithromycin Z-Pak.  Patient also provided with prescription for guaifenesin with codeine cough medication to take at bedtime.  Patient is advised to use her albuterol inhaler or albuterol nebulizer every 6 hours for the next 3 days and then as needed.  Continue use of Dulera.  Discussed with patient that she did not have any findings of a lung nodule over the right lung base and no acute cardiopulmonary disease on CT scan done 05/18/2018.  Patient did have evidence of mild emphysematous disease as well as minimal bibasilar atelectasis/scarring. Will see if patient can be seen by pulmonologist, Dr. Joya Gaskins, here at the office next week.  (Patient was also seen today by medical social worker as patient requested information on filing for disability as she feels that she can no longer keep pace at her job due to her shortness of breath) -  predniSONE (DELTASONE) 20 MG tablet; Take two tablets once per day after a meal for the next 5 days  Dispense: 10 tablet; Refill: 0 - azithromycin (ZITHROMAX) 250 MG tablet; Take 2 pills today then one daily for 4 days  Dispense: 6 tablet; Refill: 0 - guaiFENesin-codeine 100-10 MG/5ML syrup; Take 5 mLs by mouth 3 (three) times daily as needed for cough.  Dispense: 120 mL; Refill: 0  2.  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  Annual imaging by CT or MRI is recommended and patient will need repeat MRI in February 2021.  -Patient also with aortic atherosclerosis and coronary atherosclerotic disease seen on CT scan and this will be discussed with the patient at an upcoming visit and patient needs repeat of lipid panel as this was last done 10/18/17; will also need discussion regarding cardiology referral for further evaluation  Allergies as of 06/14/2018   No Known Allergies     Medication List       Accurate as of June 14, 2018 10:11 AM. Always use your most recent med list.        albuterol (2.5 MG/3ML) 0.083% nebulizer solution Commonly known as:  PROVENTIL Take 3 mLs (2.5 mg total) by nebulization every 4 (four) hours as needed for wheezing or shortness of breath.   albuterol 108 (90 Base) MCG/ACT inhaler Commonly known as:  PROVENTIL HFA;VENTOLIN HFA Inhale 2 puffs into the lungs every 4 (four) hours as needed for wheezing or shortness of breath.   amLODipine 5 MG tablet Commonly known as:  NORVASC Take 1 tablet (5 mg total) by mouth daily.   azithromycin 250 MG tablet Commonly known as:  ZITHROMAX Take 2 pills today then one daily for 4 days   BC FAST PAIN RELIEF PO Take 1 packet by mouth daily as needed (pain).   fluconazole 100 MG tablet Commonly known as:  Diflucan Take 2 pills po (200 mg) on the first day and  1 pill po  daily x 21 days.   FLUoxetine 20 MG tablet Commonly known as:  PROZAC Take 20 mg by mouth daily.   gabapentin 400 MG capsule Commonly known as:  NEURONTIN Take 1 capsule (400 mg total) by mouth 3 (three) times daily.   guaiFENesin-codeine 100-10 MG/5ML syrup Take 5 mLs by mouth 3 (three) times daily as needed for cough.   ibuprofen 200 MG tablet Commonly known as:  ADVIL,MOTRIN Take 400 mg by mouth 3 (three) times daily as needed for moderate pain.   levofloxacin 500 MG tablet Commonly known as:  LEVAQUIN Take 1 tablet (500 mg total) by mouth daily.   lisinopril 20 MG tablet Commonly known as:  PRINIVIL,ZESTRIL Take 1 tablet (20 mg total) by mouth 2 (two) times daily.   loperamide 2 MG tablet Commonly known as:  IMODIUM A-D Take 4 mg by mouth 2 (two) times daily as needed for diarrhea or loose stools.   mometasone-formoterol 100-5 MCG/ACT Aero Commonly known as:  DULERA Inhale 2 puffs into the lungs 2 (two) times daily.   OLANZapine 5 MG tablet Commonly known as:  ZYPREXA Take 5 mg by mouth at bedtime.   omeprazole 40 MG capsule Commonly known as:  PRILOSEC Take 1 capsule (40 mg total) by mouth 2 (two) times daily. To reduce stomach acid   predniSONE 20 MG tablet Commonly known as:  DELTASONE Take two tablets once per day after a meal for the next 5 days      An After Visit Summary was printed and given to the patient.  Follow-up: Return in about 1 week (around 06/21/2018) for later this week or next if not better; 1 week with Dr. Joya Gaskins if possible.   [name], MD

## 2018-06-14 NOTE — Telephone Encounter (Signed)
Met with the patient when she was in the clinic today to inform her that [employer] has been trying to contact her about the straight catheters. She said that she has received messages from [employer] but has not returned any calls. This CM explained that they want to know if her medicaid has been approved and if she needs additional catheters. She said that her medicaid has not been approved yet and she does not need catheters stating that she has plenty.  She also acknowledged that she needs to call [employer] back and she has the phone number at home.    Call placed to Alba Cory, [employer] and informed him of the above noted conversation with the patient.

## 2018-06-19 NOTE — BH Specialist Note (Signed)
MSW Intern introduced self and explained role at [facility]. Pt shared that she is in need of assistance with disability appeal. MSW assisted with completion of Legal Aid referral.   LCSWA faxed completed referral to Abelino Derrick on 06/19/2018

## 2018-06-28 MED FILL — ?AMLODIPINE BESYLATE 5MG TA: 5 | 90 days supply | Qty: 90 | Fill #3

## 2018-06-28 MED FILL — GABAPENTIN 400 MG CAPSULE: 400 | 30 days supply | Qty: 90 | Fill #5

## 2018-06-28 MED FILL — LISINOPRIL 20 MG TAB: 20 | 30 days supply | Qty: 60 | Fill #3

## 2018-06-29 MED FILL — ALBUTEROL SUL 2.5 MG/3 ML S: (2.5 MG/3ML | 5 days supply | Qty: 90 | Fill #3

## 2018-07-24 ENCOUNTER — Ambulatory Visit: Payer: Self-pay | Admitting: Gastroenterology

## 2018-07-25 ENCOUNTER — Other Ambulatory Visit: Payer: Self-pay | Admitting: Family Medicine

## 2018-07-25 DIAGNOSIS — J449 Chronic obstructive pulmonary disease, unspecified: Secondary | ICD-10-CM

## 2018-07-26 ENCOUNTER — Ambulatory Visit (INDEPENDENT_AMBULATORY_CARE_PROVIDER_SITE_OTHER): Payer: Self-pay | Admitting: Internal Medicine

## 2018-07-27 MED FILL — ?ALBUTEROL SUL 2.5 MG/3 MLS: (2.5 MG/3ML | 5 days supply | Qty: 90 | Fill #0

## 2018-08-11 MED FILL — LISINOPRIL 20 MG TAB: 20 | 30 days supply | Qty: 60 | Fill #4

## 2018-08-14 MED FILL — OMEPRAZOLE DR 40 MG CAPSULE: 40 | 30 days supply | Qty: 60 | Fill #1

## 2018-08-27 ENCOUNTER — Encounter: Payer: Self-pay | Admitting: Family Medicine

## 2018-09-14 ENCOUNTER — Other Ambulatory Visit: Payer: Self-pay | Admitting: Family Medicine

## 2018-09-14 DIAGNOSIS — I10 Essential (primary) hypertension: Secondary | ICD-10-CM

## 2018-09-14 MED FILL — ?AMLODIPINE BESYLATE 5MG TA: 5 | 30 days supply | Qty: 30 | Fill #0

## 2018-09-14 MED FILL — LISINOPRIL 20 MG TABLET: 20 | 30 days supply | Qty: 60 | Fill #5

## 2018-09-15 ENCOUNTER — Encounter: Payer: Self-pay | Admitting: Family Medicine

## 2018-09-15 ENCOUNTER — Other Ambulatory Visit: Payer: Self-pay

## 2018-09-15 ENCOUNTER — Ambulatory Visit: Payer: Medicaid Other | Attending: Family Medicine | Admitting: Family Medicine

## 2018-09-15 DIAGNOSIS — J441 Chronic obstructive pulmonary disease with (acute) exacerbation: Secondary | ICD-10-CM

## 2018-09-15 MED ORDER — AZITHROMYCIN 250 MG PO TABS: ORAL_TABLET | ORAL | 0 refills | Status: DC

## 2018-09-15 MED ORDER — PREDNISONE 20 MG PO TABS: ORAL_TABLET | ORAL | 0 refills | Status: DC

## 2018-09-15 NOTE — Progress Notes (Signed)
COPD flare up. When she walks across the room she gets really winded.

## 2018-09-15 NOTE — Progress Notes (Signed)
Virtual Visit via Telephone Note  I connected with Monica Scott on 09/15/18 at 10:10 AM EDT by telephone and verified that I am speaking with the correct person using two identifiers.   I discussed the limitations, risks, security and privacy concerns of performing an evaluation and management service by telephone and the availability of in person appointments. I also discussed with the patient that there may be a patient responsible charge related to this service. The patient expressed understanding and agreed to proceed.  Patient Location: Home with her 42-year-old grandson Provider Location: Office Others participating in call: Emilio Aspen, RMA   History of Present Illness:      63 year old female with complaint of possible COPD exacerbation.  Patient reports that she has had increased shortness of breath with ambulation over the past week.  She does have mild increase in cough but cough is been nonproductive.  She did have a few days of chest tightness earlier in the week.  She denies any current episodes of wheezing.  She has had increased use of her albuterol.  She denies any fever or chills, no increased nasal congestion with postnasal drainage and no sore throat.  She has had some mild increase in fatigue.   Past Medical History:  Diagnosis Date  . Arthritis   . Asthma   . Bladder disease   . COPD (chronic obstructive pulmonary disease) ([place])   . Depression   . GERD (gastroesophageal reflux disease)   . Hyperlipidemia   . Hypertension   . Peptic ulcer     Past Surgical History:  Procedure Laterality Date  . ABDOMINAL HYSTERECTOMY    . BIOPSY  04/24/2018   Procedure: BIOPSY;  Surgeon: Daneil Dolin, MD;  [facility]: [facility];  Service: Endoscopy;;  esophagus  . COLONOSCOPY WITH PROPOFOL N/A 04/24/2018   Procedure: COLONOSCOPY WITH PROPOFOL;  Surgeon: Daneil Dolin, MD;  Location: [facility];  Service: Endoscopy;  Laterality: N/A;  9:00am  .  ESOPHAGOGASTRODUODENOSCOPY (EGD) WITH PROPOFOL N/A 04/24/2018   Procedure: ESOPHAGOGASTRODUODENOSCOPY (EGD) WITH PROPOFOL;  Surgeon: Daneil Dolin, MD;  [facility]: [facility];  Service: Endoscopy;  Laterality: N/A;  Venia Minks DILATION N/A 04/24/2018   Procedure: Venia Minks DILATION;  Surgeon: Daneil Dolin, MD;  [facility]: [facility];  Service: Endoscopy;  Laterality: N/A;  . PILONIDAL CYST EXCISION     age 74  . POLYPECTOMY  04/24/2018   Procedure: POLYPECTOMY;  Surgeon: Daneil Dolin, MD;  Location: [facility];  Service: Endoscopy;;  colon     Family History  Problem Relation Age of Onset  . Alcohol abuse Mother   . Arthritis Mother   . Cancer Mother   . Depression Mother   . Early death Mother   . Alcohol abuse Father   . Depression Father   . Alcohol abuse Sister   . Depression Sister   . Alcohol abuse Brother   . Alcohol abuse Daughter   . Depression Daughter   . Alcohol abuse Son   . COPD Maternal Grandfather   . Heart disease Maternal Grandfather   . Hypertension Maternal Grandfather   . Colon cancer Brother        deceased at age 59    Social History   Tobacco Use  . Smoking status: Former Smoker    Packs/day: 1.50    Years: 40.00    Pack years: 60.00    Types: Cigarettes    Quit date: 04/19/2010    Years  since quitting: 8.4  . Smokeless tobacco: Never Used  Substance Use Topics  . Alcohol use: No    Comment: Former ETOH. none since 2000  . Drug use: No     No Known Allergies     Observations/Objective: No vital signs or physical exam conducted as visit was done via telephone  Assessment and Plan: 1. COPD with acute exacerbation ([place]) Patient is encouraged to increase albuterol use to every 6 hours for the next 3 days and then may go back to her normal schedule.  Continue current home respiratory medications.  Prescription for 5-day treatment of prednisone and azithromycin Z-Pak if symptoms worsen.  Call or return to clinic next week if she is not  feeling better but go to emergency department if she has acute shortness of breath, chest pain, difficulty breathing or any concerns. - predniSONE (DELTASONE) 20 MG tablet; Take two tablets once per day after a meal for the next 5 days  Dispense: 10 tablet; Refill: 0 - azithromycin (ZITHROMAX) 250 MG tablet; Take 2 pills today then one daily for 4 days  Dispense: 6 tablet; Refill: 0  Follow Up Instructions:    I discussed the assessment and treatment plan with the patient. The patient was provided an opportunity to ask questions and all were answered. The patient agreed with the plan and demonstrated an understanding of the instructions.   The patient was advised to call back or seek an in-person evaluation if the symptoms worsen or if the condition fails to improve as anticipated.  I provided 6 minutes of non-face-to-face time during this encounter.   [name], MD

## 2018-09-18 MED FILL — ALBUTEROL 0.083% INHAL SOLN: (2.5 MG/3ML | 4 days supply | Qty: 75 | Fill #1

## 2018-10-02 MED FILL — ALBUTEROL 0.083% INHAL SOLN: (2.5 MG/3ML | 4 days supply | Qty: 75 | Fill #2

## 2018-10-02 MED FILL — OMEPRAZOLE DR 40 MG CAPSULE: 40 | 30 days supply | Qty: 60 | Fill #2

## 2018-10-09 ENCOUNTER — Other Ambulatory Visit: Payer: Self-pay

## 2018-10-09 ENCOUNTER — Encounter: Payer: Self-pay | Admitting: Internal Medicine

## 2018-10-09 ENCOUNTER — Ambulatory Visit: Payer: Medicaid Other | Attending: Internal Medicine | Admitting: Internal Medicine

## 2018-10-09 DIAGNOSIS — M545 Low back pain, unspecified: Secondary | ICD-10-CM

## 2018-10-09 MED ORDER — CYCLOBENZAPRINE HCL 5 MG PO TABS: 5.0000 mg | ORAL_TABLET | Freq: Two times a day (BID) | ORAL | 0 refills | Status: DC | PRN

## 2018-10-09 NOTE — Progress Notes (Signed)
Pt states she is having back spasm  Pt states when she is moving around that's when her back starts to hurt

## 2018-10-09 NOTE — Progress Notes (Signed)
Virtual Visit via Telephone Note Due to current restrictions/limitations of in-office visits due to the COVID-19 pandemic, this scheduled clinical appointment was converted to a telehealth visit  I connected with Monica Scott on 10/09/18 at 2:57 p.m by telephone and verified that I am speaking with the correct person using two identifiers. I am in my office.  The patient is at home.  Only the patient and myself participated in this encounter.  I discussed the limitations, risks, security and privacy concerns of performing an evaluation and management service by telephone and the availability of in person appointments. I also discussed with the patient that there may be a patient responsible charge related to this service. The patient expressed understanding and agreed to proceed.   History of Present Illness: Pt with hx of COPD, HTN, HL, ectasia of thoracic aorta.  PCP is [facility].  Today's visit is UC visit.  Patient complains of pain in the left lower back that started several days ago.  Patient states that she was standing in her kitchen washing dishes and turned a certain way when she felt a pull in the left lower back.  Since then she has been having a lot of muscle spasms in that area and pain.  She finds it uncomfortable getting up from a seated position.  Pain and spasms are worse when she is standing still and walking.  She denies any radiation of pain down the legs, no numbness or tingling, no fever, no loss of bowel or bladder.  She has been using ibuprofen 200 mg 2 tablets every 4 hours which helps some.  Current Outpatient Medications on File Prior to Visit  Medication Sig Dispense Refill  . FLUoxetine (PROZAC) 20 MG tablet Take 20 mg by mouth daily.    Marland Kitchen gabapentin (NEURONTIN) 400 MG capsule Take 1 capsule (400 mg total) by mouth 3 (three) times daily. 90 capsule 11  . ibuprofen (ADVIL,MOTRIN) 200 MG tablet Take 400 mg by mouth 3 (three) times daily as needed for moderate pain.    Marland Kitchen  lisinopril (PRINIVIL,ZESTRIL) 20 MG tablet Take 1 tablet (20 mg total) by mouth 2 (two) times daily. 60 tablet 6  . loperamide (IMODIUM A-D) 2 MG tablet Take 4 mg by mouth 2 (two) times daily as needed for diarrhea or loose stools.    . mometasone-formoterol (DULERA) 100-5 MCG/ACT AERO Inhale 2 puffs into the lungs 2 (two) times daily. 8.8 g 11  . OLANZapine (ZYPREXA) 5 MG tablet Take 5 mg by mouth at bedtime.    Marland Kitchen omeprazole (PRILOSEC) 40 MG capsule Take 1 capsule (40 mg total) by mouth 2 (two) times daily. To reduce stomach acid 60 capsule 3  . albuterol (PROVENTIL HFA;VENTOLIN HFA) 108 (90 Base) MCG/ACT inhaler Inhale 2 puffs into the lungs every 4 (four) hours as needed for wheezing or shortness of breath. 1 Inhaler 2  . albuterol (PROVENTIL) (2.5 MG/3ML) 0.083% nebulizer solution TAKE 3 MLS (2.5 MG TOTAL) BY NEBULIZATION EVERY 4 (FOUR) HOURS AS NEEDED FOR WHEEZING OR SHORTNESS OF BREATH. 180 mL 2  . amLODipine (NORVASC) 5 MG tablet TAKE 1 TABLET BY MOUTH DAILY. 90 tablet 0  . Aspirin-Caffeine (BC FAST PAIN RELIEF PO) Take 1 packet by mouth daily as needed (pain).    Marland Kitchen azithromycin (ZITHROMAX) 250 MG tablet Take 2 pills today then one daily for 4 days (Patient not taking: Reported on 10/09/2018) 6 tablet 0  . fluconazole (DIFLUCAN) 100 MG tablet Take 2 pills po (200 mg) on the  first day and 1 pill po daily x 21 days. (Patient not taking: Reported on 10/09/2018) 23 tablet 0  . guaiFENesin-codeine 100-10 MG/5ML syrup Take 5 mLs by mouth 3 (three) times daily as needed for cough. (Patient not taking: Reported on 09/15/2018) 120 mL 0  . levofloxacin (LEVAQUIN) 500 MG tablet Take 1 tablet (500 mg total) by mouth daily. (Patient not taking: Reported on 09/15/2018) 7 tablet 0  . predniSONE (DELTASONE) 20 MG tablet Take two tablets once per day after a meal for the next 5 days (Patient not taking: Reported on 10/09/2018) 10 tablet 0   No current facility-administered medications on file prior to visit.      Observations/Objective: No direct observation done as this was a telephone encounter  Assessment and Plan: 1. Acute left-sided low back pain without sciatica Sounds like a pulled muscle. -Recommend using a heating pad. I see that she is on omeprazole for acid reflux, so I recommend spacing out the ibuprofen or taking Tylenol instead. I have given some Flexeril to take as needed.  Patient informed that this medication can cause drowsiness and that she should not drive or operate heavy machinery when she takes it. - cyclobenzaprine (FLEXERIL) 5 MG tablet; Take 1 tablet (5 mg total) by mouth 2 (two) times daily as needed for muscle spasms.  Dispense: 20 tablet; Refill: 0   Follow Up Instructions: PRN with her PCP if no improvement   I discussed the assessment and treatment plan with the patient. The patient was provided an opportunity to ask questions and all were answered. The patient agreed with the plan and demonstrated an understanding of the instructions.   The patient was advised to call back or seek an in-person evaluation if the symptoms worsen or if the condition fails to improve as anticipated.  I provided 5 minutes of non-face-to-face time during this encounter.   Karle Plumber, MD

## 2018-10-16 MED FILL — ALBUTEROL SUL 2.5 MG/3 ML S: (2.5 MG/3ML | 8 days supply | Qty: 150 | Fill #3

## 2018-10-23 MED FILL — LISINOPRIL 20 MG TABLET: 20 | 30 days supply | Qty: 60 | Fill #6

## 2018-10-23 MED FILL — ?AMLODIPINE BESYLATE 5MG TA: 5 | 30 days supply | Qty: 30 | Fill #1

## 2018-10-30 ENCOUNTER — Telehealth: Payer: Self-pay | Admitting: Family Medicine

## 2018-10-30 DIAGNOSIS — M545 Low back pain, unspecified: Secondary | ICD-10-CM

## 2018-10-30 MED ORDER — CYCLOBENZAPRINE HCL 5 MG PO TABS: 5.0000 mg | ORAL_TABLET | Freq: Two times a day (BID) | ORAL | 0 refills | Status: DC | PRN

## 2018-10-30 NOTE — Telephone Encounter (Signed)
1) Medication(s) Requested (by name): FLEXERIL  2) Pharmacy of Choice: [facility]  3) Special Requests: Telephone visit Karle Plumber 10/09/18   Approved medications will be sent to the pharmacy, we will reach out if there is an issue.  Requests made after 3pm may not be addressed until the following business day!  If a patient is unsure of the name of the medication(s) please note and ask patient to call back when they are able to provide all info, do not send to responsible party until all information is available!

## 2018-11-04 ENCOUNTER — Other Ambulatory Visit: Payer: Self-pay

## 2018-11-04 ENCOUNTER — Emergency Department (HOSPITAL_BASED_OUTPATIENT_CLINIC_OR_DEPARTMENT_OTHER)
Admission: EM | Admit: 2018-11-04 | Discharge: 2018-11-04 | Disposition: A | Discharge status: Home / Self Care | Payer: Medicaid Other | Attending: Emergency Medicine | Admitting: Emergency Medicine

## 2018-11-04 ENCOUNTER — Emergency Department (HOSPITAL_BASED_OUTPATIENT_CLINIC_OR_DEPARTMENT_OTHER): Payer: Medicaid Other

## 2018-11-04 DIAGNOSIS — E785 Hyperlipidemia, unspecified: Secondary | ICD-10-CM | POA: Insufficient documentation

## 2018-11-04 DIAGNOSIS — J441 Chronic obstructive pulmonary disease with (acute) exacerbation: Secondary | ICD-10-CM | POA: Insufficient documentation

## 2018-11-04 DIAGNOSIS — J439 Emphysema, unspecified: Secondary | ICD-10-CM | POA: Diagnosis not present

## 2018-11-04 DIAGNOSIS — R0602 Shortness of breath: Secondary | ICD-10-CM | POA: Diagnosis present

## 2018-11-04 DIAGNOSIS — I2510 Atherosclerotic heart disease of native coronary artery without angina pectoris: Secondary | ICD-10-CM | POA: Insufficient documentation

## 2018-11-04 DIAGNOSIS — I10 Essential (primary) hypertension: Secondary | ICD-10-CM | POA: Insufficient documentation

## 2018-11-04 DIAGNOSIS — Z9981 Dependence on supplemental oxygen: Secondary | ICD-10-CM | POA: Diagnosis not present

## 2018-11-04 DIAGNOSIS — Z87891 Personal history of nicotine dependence: Secondary | ICD-10-CM | POA: Insufficient documentation

## 2018-11-04 LAB — CBC WITH DIFFERENTIAL/PLATELET
Abs Immature Granulocytes: 0.24 10*3/uL — ABNORMAL HIGH (ref 0.00–0.07)
Basophils Absolute: 0.1 10*3/uL (ref 0.0–0.1)
Basophils Relative: 0 %
Eosinophils Absolute: 0 10*3/uL (ref 0.0–0.5)
Eosinophils Relative: 0 %
HCT: 39.7 % (ref 36.0–46.0)
Hemoglobin: 12.5 g/dL (ref 12.0–15.0)
Immature Granulocytes: 1 %
Lymphocytes Relative: 12 %
Lymphs Abs: 2.2 10*3/uL (ref 0.7–4.0)
MCH: 28.6 pg (ref 26.0–34.0)
MCHC: 31.5 g/dL (ref 30.0–36.0)
MCV: 90.8 fL (ref 80.0–100.0)
Monocytes Absolute: 0.5 10*3/uL (ref 0.1–1.0)
Monocytes Relative: 3 %
Neutro Abs: 14.7 10*3/uL — ABNORMAL HIGH (ref 1.7–7.7)
Neutrophils Relative %: 84 %
Platelets: 531 10*3/uL — ABNORMAL HIGH (ref 150–400)
RBC: 4.37 MIL/uL (ref 3.87–5.11)
RDW: 15.8 % — ABNORMAL HIGH (ref 11.5–15.5)
WBC: 17.8 10*3/uL — ABNORMAL HIGH (ref 4.0–10.5)
nRBC: 0 % (ref 0.0–0.2)

## 2018-11-04 LAB — TROPONIN I (HIGH SENSITIVITY): Troponin I (High Sensitivity): 3 ng/L (ref <18)

## 2018-11-04 LAB — BASIC METABOLIC PANEL
Anion gap: 14 (ref 5–15)
BUN: 12 mg/dL (ref 8–23)
CO2: 24 mmol/L (ref 22–32)
Calcium: 9.4 mg/dL (ref 8.9–10.3)
Chloride: 100 mmol/L (ref 98–111)
Creatinine, Ser: 0.9 mg/dL (ref 0.44–1.00)
GFR calc Af Amer: >60 mL/min (ref >60)
GFR calc non Af Amer: >60 mL/min (ref >60)
Glucose, Bld: 156 mg/dL — ABNORMAL HIGH (ref 70–99)
Potassium: 3.4 mmol/L — ABNORMAL LOW (ref 3.5–5.1)
Sodium: 138 mmol/L (ref 135–145)

## 2018-11-04 MED ORDER — PREDNISONE 10 MG (21) PO TBPK: ORAL_TABLET | Freq: Every day | ORAL | 0 refills | Status: DC

## 2018-11-04 MED ORDER — DOXYCYCLINE HYCLATE 100 MG PO CAPS: 100.0000 mg | ORAL_CAPSULE | Freq: Two times a day (BID) | ORAL | 0 refills | Status: AC

## 2018-11-04 MED ORDER — ALBUTEROL SULFATE (2.5 MG/3ML) 0.083% IN NEBU: 5.0000 mg | INHALATION_SOLUTION | Freq: Once | RESPIRATORY_TRACT | Status: DC

## 2018-11-04 MED ORDER — ALBUTEROL SULFATE HFA 108 (90 BASE) MCG/ACT IN AERS
INHALATION_SPRAY | RESPIRATORY_TRACT | Status: AC
  Administered 2018-11-04: 4 via RESPIRATORY_TRACT
  Filled 2018-11-04: qty 6.7

## 2018-11-04 MED ORDER — ALBUTEROL SULFATE HFA 108 (90 BASE) MCG/ACT IN AERS: 1.0000 | INHALATION_SPRAY | Freq: Four times a day (QID) | RESPIRATORY_TRACT | 0 refills | Status: DC | PRN

## 2018-11-04 MED ORDER — ALBUTEROL SULFATE HFA 108 (90 BASE) MCG/ACT IN AERS
4.0000 | INHALATION_SPRAY | Freq: Once | RESPIRATORY_TRACT | Status: AC
  Administered 2018-11-04: 4 via RESPIRATORY_TRACT

## 2018-11-04 NOTE — Discharge Instructions (Signed)
We believe that your symptoms are caused today by an exacerbation of your COPD, and possibly bronchitis.  Please take the prescribed medications and any medications that you have at home for your COPD.  Follow up with your doctor as recommended.  If you develop any new or worsening symptoms, including but not limited to fever, persistent vomiting, worsening shortness of breath, or other symptoms that concern you, please return to the Emergency Department immediately. ° ° °Chronic Obstructive Pulmonary Disease °Chronic obstructive pulmonary disease (COPD) is a common lung condition in which airflow from the lungs is limited. COPD is a general term that can be used to describe many different lung problems that limit airflow, including both chronic bronchitis and emphysema.  If you have COPD, your lung function will probably never return to normal, but there are measures you can take to improve lung function and make yourself feel better.  °CAUSES  °Smoking (common).   °Exposure to secondhand smoke.   °Genetic problems. °Chronic inflammatory lung diseases or recurrent infections. °SYMPTOMS  °Shortness of breath, especially with physical activity.   °Deep, persistent (chronic) cough with a large amount of thick mucus.   °Wheezing.   °Rapid breaths (tachypnea).   °Gray or bluish discoloration (cyanosis) of the skin, especially in fingers, toes, or lips.   °Fatigue.   °Weight loss.   °Frequent infections or episodes when breathing symptoms become much worse (exacerbations).   °Chest tightness. °DIAGNOSIS  °Your health care provider will take a medical history and perform a physical examination to make the initial diagnosis.  Additional tests for COPD may include:  °Lung (pulmonary) function tests. °Chest X-ray. °CT scan. °Blood tests. °TREATMENT  °Treatment available to help you feel better when you have COPD includes:  °Inhaler and nebulizer medicines. These help manage the symptoms of COPD and make your breathing more  comfortable. °Supplemental oxygen. Supplemental oxygen is only helpful if you have a low oxygen level in your blood.   °Exercise and physical activity. These are beneficial for nearly all people with COPD. Some people may also benefit from a pulmonary rehabilitation program. °HOME CARE INSTRUCTIONS  °Take all medicines (inhaled or pills) as directed by your health care provider. °Avoid over-the-counter medicines or cough syrups that dry up your airway (such as antihistamines) and slow down the elimination of secretions unless instructed otherwise by your health care provider.   °If you are a smoker, the most important thing that you can do is stop smoking. Continuing to smoke will cause further lung damage and breathing trouble. Ask your health care provider for help with quitting smoking. He or she can direct you to community resources or hospitals that provide support. °Avoid exposure to irritants such as smoke, chemicals, and fumes that aggravate your breathing. °Use oxygen therapy and pulmonary rehabilitation if directed by your health care provider. If you require home oxygen therapy, ask your health care provider whether you should purchase a pulse oximeter to measure your oxygen level at home.   °Avoid contact with individuals who have a contagious illness. °Avoid extreme temperature and humidity changes. °Eat healthy foods. Eating smaller, more frequent meals and resting before meals may help you maintain your strength. °Stay active, but balance activity with periods of rest. Exercise and physical activity will help you maintain your ability to do things you want to do. °Preventing infection and hospitalization is very important when you have COPD. Make sure to receive all the vaccines your health care provider recommends, especially the pneumococcal and influenza   vaccines. Ask your health care provider whether you need a pneumonia vaccine. °Learn and use relaxation techniques to manage stress. °Learn and  use controlled breathing techniques as directed by your health care provider. Controlled breathing techniques include:   °Pursed lip breathing. Start by breathing in (inhaling) through your nose for 1 second. Then, purse your lips as if you were going to whistle and breathe out (exhale) through the pursed lips for 2 seconds.   °Diaphragmatic breathing. Start by putting one hand on your abdomen just above your waist. Inhale slowly through your nose. The hand on your abdomen should move out. Then purse your lips and exhale slowly. You should be able to feel the hand on your abdomen moving in as you exhale.   °Learn and use controlled coughing to clear mucus from your lungs. Controlled coughing is a series of short, progressive coughs. The steps of controlled coughing are:   °Lean your head slightly forward.   °Breathe in deeply using diaphragmatic breathing.   °Try to hold your breath for 3 seconds.   °Keep your mouth slightly open while coughing twice.   °Spit any mucus out into a tissue.   °Rest and repeat the steps once or twice as needed. °SEEK MEDICAL CARE IF:  °You are coughing up more mucus than usual.   °There is a change in the color or thickness of your mucus.   °Your breathing is more labored than usual.   °Your breathing is faster than usual.   °SEEK IMMEDIATE MEDICAL CARE IF:  °You have shortness of breath while you are resting.   °You have shortness of breath that prevents you from: °Being able to talk.   °Performing your usual physical activities.   °You have chest pain lasting longer than 5 minutes.   °Your skin color is more cyanotic than usual. °You measure low oxygen saturations for longer than 5 minutes with a pulse oximeter. °MAKE SURE YOU:  °Understand these instructions. °Will watch your condition. °Will get help right away if you are not doing well or get worse. °Document Released: 12/23/2004 Document Revised: 07/30/2013 Document Reviewed: 11/09/2012 °[employer]® Patient Information ©2015  [employer]. This information is not intended to replace advice given to you by your health care provider. Make sure you discuss any questions you have with your health care provider. ° °

## 2018-11-04 NOTE — ED Notes (Signed)
ED Provider at bedside. 

## 2018-11-04 NOTE — ED Notes (Signed)
Ambulated on R/A  HR 106, Spo2 90%, RR 20 SpO2 drop 88% HR 116, RR 28-30.  Patient endorses [facility] after ambulating.

## 2018-11-04 NOTE — ED Provider Notes (Signed)
Emergency Department Provider Note   I have reviewed the triage vital signs and the nursing notes.   HISTORY  Chief Complaint Shortness of Breath   HPI Monica Scott is a 63 y.o. female with past medical history of COPD, on home oxygen as needed, who presents to the emergency department for evaluation of shortness of breath.  Symptoms have been ongoing for the past 5 days.  She is currently on day 4 of steroid medication and continued nebulizer use at home with only mild improvement in symptoms.  She denies fevers, chills, sick contacts.  Denies any chest pain or heart palpitations.  No lightheadedness or near syncope.  She has been needing to use her home oxygen at home more than normal but does not need it constantly.  She is mainly using it at night.  No other symptoms or modifying factors.  Past Medical History:  Diagnosis Date  . Arthritis   . Asthma   . Bladder disease   . COPD (chronic obstructive pulmonary disease) ([place])   . Depression   . GERD (gastroesophageal reflux disease)   . Hyperlipidemia   . Hypertension   . Peptic ulcer     Patient Active Problem List   Diagnosis Date Noted  . Emphysema of lung ([place]) 06/14/2018  . Aortic atherosclerosis ([place]) 06/14/2018  . Atherosclerosis of coronary artery 06/14/2018  . Thoracic aortic ectasia ([place]) 05/19/2018  . Esophageal dysphagia 03/01/2018  . Family history of colon cancer 03/01/2018  . Diarrhea 03/01/2018  . Acute respiratory failure with hypoxia ([place]) 10/17/2017  . Hypokalemia 10/17/2017  . Near syncope 10/17/2017  . Hyperlipidemia 10/17/2017  . GERD (gastroesophageal reflux disease) 10/17/2017  . Bladder disease or syndrome 10/17/2017  . Depression 10/17/2017  . Hypertension, essential, benign 02/19/2017  . COPD (chronic obstructive pulmonary disease) ([place]) 02/11/2017    Past Surgical History:  Procedure Laterality Date  . ABDOMINAL HYSTERECTOMY    . BIOPSY  04/24/2018   Procedure: BIOPSY;  Surgeon:  Daneil Dolin, MD;  [facility];  Service: Endoscopy;;  esophagus  . COLONOSCOPY WITH PROPOFOL N/A 04/24/2018   Procedure: COLONOSCOPY WITH PROPOFOL;  Surgeon: Daneil Dolin, MD;  [facility];  Service: Endoscopy;  Laterality: N/A;  9:00am  . ESOPHAGOGASTRODUODENOSCOPY (EGD) WITH PROPOFOL N/A 04/24/2018   Procedure: ESOPHAGOGASTRODUODENOSCOPY (EGD) WITH PROPOFOL;  Surgeon: Daneil Dolin, MD;  [facility];  Service: Endoscopy;  Laterality: N/A;  Venia Minks DILATION N/A 04/24/2018   Procedure: Venia Minks DILATION;  Surgeon: Daneil Dolin, MD;  [facility];  Service: Endoscopy;  Laterality: N/A;  . PILONIDAL CYST EXCISION     age 63  . POLYPECTOMY  04/24/2018   Procedure: POLYPECTOMY;  Surgeon: Daneil Dolin, MD;  [facility];  Service: Endoscopy;;  colon     Allergies Patient has no known allergies.  Family History  Problem Relation Age of Onset  . Alcohol abuse Mother   . Arthritis Mother   . Cancer Mother   . Depression Mother   . Early death Mother   . Alcohol abuse Father   . Depression Father   . Alcohol abuse Sister   . Depression Sister   . Alcohol abuse Brother   . Alcohol abuse Daughter   . Depression Daughter   . Alcohol abuse Son   . COPD Maternal Grandfather   . Heart disease Maternal Grandfather   . Hypertension Maternal Grandfather   . Colon cancer Brother  deceased at age 36    Social History Social History   Tobacco Use  . Smoking status: Former Smoker    Packs/day: 1.50    Years: 40.00    Pack years: 60.00    Types: Cigarettes    Quit date: 04/19/2010    Years since quitting: 8.5  . Smokeless tobacco: Never Used  Substance Use Topics  . Alcohol use: No    Comment: Former ETOH. none since 2000  . Drug use: No    Review of Systems  Constitutional: No fever/chills Eyes: No visual changes. ENT: No sore throat. Cardiovascular: Denies chest pain. Respiratory: Positive shortness  of breath. Gastrointestinal: No abdominal pain.  No nausea, no vomiting.  No diarrhea.  No constipation. Genitourinary: Negative for dysuria. Musculoskeletal: Negative for back pain. Skin: Negative for rash. Neurological: Negative for headaches, focal weakness or numbness.  10-point ROS otherwise negative.  ____________________________________________   PHYSICAL EXAM:  VITAL SIGNS: ED Triage Vitals  Enc Vitals Group     BP 11/04/18 1130 (!) 141/83     Pulse Rate 11/04/18 1130 (!) 108     Resp 11/04/18 1130 (!) 21     Temp 11/04/18 1132 99.2 F (37.3 C)     Temp Source 11/04/18 1132 Oral     SpO2 11/04/18 1130 94 %     Weight 11/04/18 1123 165 lb (74.8 kg)     Height 11/04/18 1123 4\' 11"  (1.499 m)   Constitutional: Alert and oriented. Well appearing and in no acute distress. Eyes: Conjunctivae are normal. Head: Atraumatic. Nose: No congestion/rhinnorhea. Mouth/Throat: Mucous membranes are moist.  Neck: No stridor.  Cardiovascular: Tachycardia. Good peripheral circulation. Grossly normal heart sounds.   Respiratory: Normal respiratory effort.  No retractions. Lungs with mild decreased air entry bilaterally and mild end-expiratory wheezing.  Gastrointestinal: Soft and nontender. No distention.  Musculoskeletal: No lower extremity tenderness nor edema. No gross deformities of extremities. Neurologic:  Normal speech and language. Skin:  Skin is warm, dry and intact. No rash noted.   ____________________________________________   LABS (all labs ordered are listed, but only abnormal results are displayed)  Labs Reviewed  BASIC METABOLIC PANEL - Abnormal; Notable for the following components:      Result Value   Potassium 3.4 (*)    Glucose, Bld 156 (*)    All other components within normal limits  CBC WITH DIFFERENTIAL/PLATELET - Abnormal; Notable for the following components:   WBC 17.8 (*)    RDW 15.8 (*)    Platelets 531 (*)    Neutro Abs 14.7 (*)    Abs Immature  Granulocytes 0.24 (*)    All other components within normal limits  TROPONIN I (HIGH SENSITIVITY)   ____________________________________________  EKG   EKG Interpretation  Date/Time:  Saturday November 04 2018 11:27:03 EDT Ventricular Rate:  105 PR Interval:    QRS Duration: 82 QT Interval:  336 QTC Calculation: 444 R Axis:   79 Text Interpretation:  Sinus tachycardia Baseline wander in lead(s) III No STEMI  Confirmed by Nanda Quinton [phone]) on 11/04/2018 8:15:54 PM       ____________________________________________  RADIOLOGY  Dg Chest 2 View  Result Date: 11/04/2018 CLINICAL DATA:  63 year old female with a history of COPD and shortness of breath EXAM: CHEST - 2 VIEW COMPARISON:  October 18, 2017, January 29, 2018, CT chest May 18, 2018 FINDINGS: Cardiomediastinal silhouette unchanged in size and contour. No evidence of central vascular congestion. No pneumothorax or pleural effusion. Linear opacities at the  left lung base persist. No confluent airspace disease. No displaced fracture. IMPRESSION: Chronic lung changes without evidence of acute cardiopulmonary disease Electronically Signed   By: Corrie Mckusick D.O.   On: 11/04/2018 12:23    ____________________________________________   PROCEDURES  Procedure(s) performed:   Procedures  None ____________________________________________   INITIAL IMPRESSION / ASSESSMENT AND PLAN / ED COURSE  Pertinent labs & imaging results that were available during my care of the patient were reviewed by me and considered in my medical decision making (see chart for details).   Patient arrives to the emergency department with shortness of breath.  She has history of COPD.  I do appreciate wheezing and slight decreased air entry on exam.  My suspicion for ACS/PE is very low.  Plan for albuterol treatment, screening lab work including troponin, chest x-ray, reassess.  COVID-19 is a consideration but also very low on my differential given  patient's lack of other constitutional type symptoms.  12:30 PM  Patient ambulated in the emergency department with mild dyspnea and O2 sat dipping to 88%.  This is not totally unexpected as she is on home oxygen.  Lab work reviewed along with chest x-ray with no acute findings.  After receiving a breathing treatment in the emergency department she states she is feeling "much better."  She would like to continue treating her symptoms at home and does not wish to be admitted to the hospital.  This plan seems reasonable given her overall appearance and improvement in symptoms after treatment here.  Will extend her steroid, add doxycycline, send her home with an inhaler.  She will continue her home nebulizer as needed and use her home oxygen as needed as well.  Discussed ED return precautions in detail. Very low suspicion for COVID 19 and so will not test at this time.    ____________________________________________  FINAL CLINICAL IMPRESSION(S) / ED DIAGNOSES  Final diagnoses:  COPD exacerbation ([place])     MEDICATIONS GIVEN DURING THIS VISIT:  Medications  albuterol (VENTOLIN HFA) 108 (90 Base) MCG/ACT inhaler 4 puff (4 puffs Inhalation Given 11/04/18 1135)     NEW OUTPATIENT MEDICATIONS STARTED DURING THIS VISIT:  Discharge Medication List as of 11/04/2018 12:33 PM    START taking these medications   Details  !! albuterol (VENTOLIN HFA) 108 (90 Base) MCG/ACT inhaler Inhale 1-2 puffs into the lungs every 6 (six) hours as needed for wheezing or shortness of breath., Starting Sat 11/04/2018, Normal    doxycycline (VIBRAMYCIN) 100 MG capsule Take 1 capsule (100 mg total) by mouth 2 (two) times daily for 7 days., Starting Sat 11/04/2018, Until Sat 11/11/2018, Normal    predniSONE (STERAPRED UNI-PAK 21 TAB) 10 MG (21) TBPK tablet Take by mouth daily. Take 6 tabs by mouth daily  for 2 days, then 5 tabs for 2 days, then 4 tabs for 2 days, then 3 tabs for 2 days, 2 tabs for 2 days, then 1 tab by mouth  daily for 2 days, Starting Sat 11/04/2018, Print     !! - Potential duplicate medications found. Please discuss with provider.      Note:  This document was prepared using Dragon voice recognition software and may include unintentional dictation errors.  Nanda Quinton, MD Emergency Medicine    Long, Wonda Olds, MD 11/04/18 2017

## 2018-11-04 NOTE — ED Triage Notes (Signed)
Pt c/o sob x 5 days, pt reports hx of COPD. Pt ambulatory to room. Pt denies use of albuterol today, last use last night with no relief. Pt denies chestpain, denies cough, denies fever

## 2018-11-07 ENCOUNTER — Telehealth: Payer: Self-pay

## 2018-11-07 NOTE — Telephone Encounter (Signed)
Call received from [facility] noting that the patient has been approved for medicaid and they will be shipping out supplies to patient this week.

## 2018-11-08 ENCOUNTER — Telehealth: Payer: Self-pay

## 2018-11-08 NOTE — Telephone Encounter (Signed)
Orders for catheter supplies faxed to [facility]

## 2018-11-09 ENCOUNTER — Telehealth: Payer: Self-pay | Admitting: Family Medicine

## 2018-11-09 DIAGNOSIS — J449 Chronic obstructive pulmonary disease, unspecified: Secondary | ICD-10-CM

## 2018-11-09 MED ORDER — ALBUTEROL SULFATE (2.5 MG/3ML) 0.083% IN NEBU: 2.5000 mg | INHALATION_SOLUTION | RESPIRATORY_TRACT | 0 refills | Status: DC | PRN

## 2018-11-09 MED ORDER — MOMETASONE FURO-FORMOTEROL FUM 100-5 MCG/ACT IN AERO: 2.0000 | INHALATION_SPRAY | Freq: Two times a day (BID) | RESPIRATORY_TRACT | 2 refills | Status: DC

## 2018-11-09 MED FILL — DULERA 100 MCG/5 MCG INH: 100-5 | 30 days supply | Qty: 13 | Fill #0

## 2018-11-09 MED FILL — ALBUTEROL SUL 2.5 MG/3 ML S: (2.5 MG/3ML | 8 days supply | Qty: 150 | Fill #0

## 2018-11-09 NOTE — Telephone Encounter (Signed)
Refills sent

## 2018-11-09 NOTE — Telephone Encounter (Signed)
[facility]   1) Medication(s) Requested (by name): albuterol (PROVENTIL) (2.5 MG/3ML) 0.083% nebulizer solution and mometasone-formoterol (DULERA) 100-5 MCG/ACT AERO  2) Pharmacy of Choice: [facility]  3) Special Requests:   Approved medications will be sent to the pharmacy, we will reach out if there is an issue.  Requests made after 3pm may not be addressed until the following business day!  If a patient is unsure of the name of the medication(s) please note and ask patient to call back when they are able to provide all info, do not send to responsible party until all information is available!

## 2018-11-17 ENCOUNTER — Telehealth: Payer: Self-pay | Admitting: *Deleted

## 2018-11-17 NOTE — Telephone Encounter (Signed)
Forms was faxed to [facility] for patient Catheter Supplies was faxed

## 2018-11-20 ENCOUNTER — Encounter (HOSPITAL_BASED_OUTPATIENT_CLINIC_OR_DEPARTMENT_OTHER): Payer: Self-pay

## 2018-11-20 ENCOUNTER — Other Ambulatory Visit: Payer: Self-pay

## 2018-11-20 ENCOUNTER — Emergency Department (HOSPITAL_BASED_OUTPATIENT_CLINIC_OR_DEPARTMENT_OTHER)
Admission: EM | Admit: 2018-11-20 | Discharge: 2018-11-20 | Disposition: A | Discharge status: Home / Self Care | Payer: Medicaid Other | Attending: Emergency Medicine | Admitting: Emergency Medicine

## 2018-11-20 ENCOUNTER — Emergency Department (HOSPITAL_BASED_OUTPATIENT_CLINIC_OR_DEPARTMENT_OTHER): Payer: Medicaid Other

## 2018-11-20 DIAGNOSIS — E785 Hyperlipidemia, unspecified: Secondary | ICD-10-CM | POA: Diagnosis not present

## 2018-11-20 DIAGNOSIS — Z79899 Other long term (current) drug therapy: Secondary | ICD-10-CM | POA: Diagnosis not present

## 2018-11-20 DIAGNOSIS — Z87891 Personal history of nicotine dependence: Secondary | ICD-10-CM | POA: Diagnosis not present

## 2018-11-20 DIAGNOSIS — I10 Essential (primary) hypertension: Secondary | ICD-10-CM | POA: Diagnosis not present

## 2018-11-20 DIAGNOSIS — R0602 Shortness of breath: Secondary | ICD-10-CM | POA: Diagnosis present

## 2018-11-20 DIAGNOSIS — J441 Chronic obstructive pulmonary disease with (acute) exacerbation: Secondary | ICD-10-CM | POA: Insufficient documentation

## 2018-11-20 LAB — CBC WITH DIFFERENTIAL/PLATELET
Abs Immature Granulocytes: 0.04 10*3/uL (ref 0.00–0.07)
Basophils Absolute: 0.1 10*3/uL (ref 0.0–0.1)
Basophils Relative: 1 %
Eosinophils Absolute: 0.3 10*3/uL (ref 0.0–0.5)
Eosinophils Relative: 3 %
HCT: 39.8 % (ref 36.0–46.0)
Hemoglobin: 12.5 g/dL (ref 12.0–15.0)
Immature Granulocytes: 1 %
Lymphocytes Relative: 30 %
Lymphs Abs: 2.6 10*3/uL (ref 0.7–4.0)
MCH: 28.6 pg (ref 26.0–34.0)
MCHC: 31.4 g/dL (ref 30.0–36.0)
MCV: 91.1 fL (ref 80.0–100.0)
Monocytes Absolute: 0.6 10*3/uL (ref 0.1–1.0)
Monocytes Relative: 6 %
Neutro Abs: 5.2 10*3/uL (ref 1.7–7.7)
Neutrophils Relative %: 59 %
Platelets: 364 10*3/uL (ref 150–400)
RBC: 4.37 MIL/uL (ref 3.87–5.11)
RDW: 15.9 % — ABNORMAL HIGH (ref 11.5–15.5)
WBC: 8.7 10*3/uL (ref 4.0–10.5)
nRBC: 0 % (ref 0.0–0.2)

## 2018-11-20 LAB — BASIC METABOLIC PANEL
Anion gap: 11 (ref 5–15)
BUN: 11 mg/dL (ref 8–23)
CO2: 26 mmol/L (ref 22–32)
Calcium: 8.8 mg/dL — ABNORMAL LOW (ref 8.9–10.3)
Chloride: 101 mmol/L (ref 98–111)
Creatinine, Ser: 0.78 mg/dL (ref 0.44–1.00)
GFR calc Af Amer: >60 mL/min (ref >60)
GFR calc non Af Amer: >60 mL/min (ref >60)
Glucose, Bld: 108 mg/dL — ABNORMAL HIGH (ref 70–99)
Potassium: 3.7 mmol/L (ref 3.5–5.1)
Sodium: 138 mmol/L (ref 135–145)

## 2018-11-20 LAB — BRAIN NATRIURETIC PEPTIDE: B Natriuretic Peptide: 10.2 pg/mL (ref 0.0–100.0)

## 2018-11-20 MED ORDER — AEROCHAMBER PLUS FLO-VU MISC
1.0000 | Freq: Once | Status: DC
  Filled 2018-11-20: qty 1

## 2018-11-20 MED ORDER — PREDNISONE 50 MG PO TABS
60.0000 mg | ORAL_TABLET | Freq: Once | ORAL | Status: AC
  Administered 2018-11-20: 60 mg via ORAL
  Filled 2018-11-20: qty 1

## 2018-11-20 MED ORDER — PREDNISONE 20 MG PO TABS: ORAL_TABLET | ORAL | 0 refills | Status: DC

## 2018-11-20 MED ORDER — IPRATROPIUM BROMIDE HFA 17 MCG/ACT IN AERS: 2.0000 | INHALATION_SPRAY | RESPIRATORY_TRACT | Status: DC

## 2018-11-20 MED ORDER — ALBUTEROL SULFATE HFA 108 (90 BASE) MCG/ACT IN AERS
8.0000 | INHALATION_SPRAY | Freq: Once | RESPIRATORY_TRACT | Status: AC
  Administered 2018-11-20: 8 via RESPIRATORY_TRACT
  Filled 2018-11-20: qty 6.7

## 2018-11-20 NOTE — ED Provider Notes (Signed)
[facility] EMERGENCY DEPARTMENT Provider Note   CSN: [id number] Arrival date & time: 11/20/18  [id number]     History   Chief Complaint Chief Complaint  Patient presents with  . Shortness of Breath    HPI Monica Scott is a 63 y.o. female.     HPI  63 year old female with COPD presents with recurrent COPD exacerbation.  She states this feels very similar to multiple priors.  No fever or cough but she does feel short of breath on minimal exertion.  At rest she does okay.  No chest pain, leg swelling, or any other symptoms.  No contacts with COVID-19 patient.  Has been ongoing for a couple days.    Past Medical History:  Diagnosis Date  . Arthritis   . Asthma   . Bladder disease   . COPD (chronic obstructive pulmonary disease) ([place])   . Depression   . GERD (gastroesophageal reflux disease)   . Hyperlipidemia   . Hypertension   . Peptic ulcer     Patient Active Problem List   Diagnosis Date Noted  . Emphysema of lung ([place]) 06/14/2018  . Aortic atherosclerosis ([place]) 06/14/2018  . Atherosclerosis of coronary artery 06/14/2018  . Thoracic aortic ectasia ([place]) 05/19/2018  . Esophageal dysphagia 03/01/2018  . Family history of colon cancer 03/01/2018  . Diarrhea 03/01/2018  . Acute respiratory failure with hypoxia ([place]) 10/17/2017  . Hypokalemia 10/17/2017  . Near syncope 10/17/2017  . Hyperlipidemia 10/17/2017  . GERD (gastroesophageal reflux disease) 10/17/2017  . Bladder disease or syndrome 10/17/2017  . Depression 10/17/2017  . Hypertension, essential, benign 02/19/2017  . COPD (chronic obstructive pulmonary disease) ([place]) 02/11/2017    Past Surgical History:  Procedure Laterality Date  . ABDOMINAL HYSTERECTOMY    . BIOPSY  04/24/2018   Procedure: BIOPSY;  Surgeon: Daneil Dolin, MD;  [facility];  Service: Endoscopy;;  esophagus  . COLONOSCOPY WITH PROPOFOL N/A 04/24/2018   Procedure: COLONOSCOPY WITH PROPOFOL;  Surgeon: Daneil Dolin,  MD;  [facility];  Service: Endoscopy;  Laterality: N/A;  9:00am  . ESOPHAGOGASTRODUODENOSCOPY (EGD) WITH PROPOFOL N/A 04/24/2018   Procedure: ESOPHAGOGASTRODUODENOSCOPY (EGD) WITH PROPOFOL;  Surgeon: Daneil Dolin, MD;  [facility];  Service: Endoscopy;  Laterality: N/A;  Venia Minks DILATION N/A 04/24/2018   Procedure: Venia Minks DILATION;  Surgeon: Daneil Dolin, MD;  [facility];  Service: Endoscopy;  Laterality: N/A;  . PILONIDAL CYST EXCISION     age 33  . POLYPECTOMY  04/24/2018   Procedure: POLYPECTOMY;  Surgeon: Daneil Dolin, MD;  [facility];  Service: Endoscopy;;  colon      OB History   No obstetric history on file.      Home Medications    Prior to Admission medications   Medication Sig Start Date End Date Taking? Authorizing Provider  albuterol (PROVENTIL) (2.5 MG/3ML) 0.083% nebulizer solution Take 3 mLs (2.5 mg total) by nebulization every 4 (four) hours as needed for wheezing or shortness of breath. 11/09/18   Fulp, Cammie, MD  albuterol (VENTOLIN HFA) 108 (90 Base) MCG/ACT inhaler Inhale 1-2 puffs into the lungs every 6 (six) hours as needed for wheezing or shortness of breath. 11/04/18   Long, Wonda Olds, MD  amLODipine (NORVASC) 5 MG tablet TAKE 1 TABLET BY MOUTH DAILY. 09/14/18   Fulp, Cammie, MD  Aspirin-Caffeine (BC FAST PAIN RELIEF PO) Take 1 packet by mouth daily as needed (pain).  [provider]  cyclobenzaprine (FLEXERIL) 5 MG tablet Take 1 tablet (5 mg total) by mouth 2 (two) times daily as needed for muscle spasms. 10/30/18   Fulp, Cammie, MD  FLUoxetine (PROZAC) 20 MG tablet Take 20 mg by mouth daily.    [provider]  gabapentin (NEURONTIN) 400 MG capsule Take 1 capsule (400 mg total) by mouth 3 (three) times daily. 12/06/17   Fulp, Cammie, MD  ibuprofen (ADVIL,MOTRIN) 200 MG tablet Take 400 mg by mouth 3 (three) times daily as needed for moderate pain.    [provider]  lisinopril  (PRINIVIL,ZESTRIL) 20 MG tablet Take 1 tablet (20 mg total) by mouth 2 (two) times daily. 12/06/17   Fulp, Cammie, MD  loperamide (IMODIUM A-D) 2 MG tablet Take 4 mg by mouth 2 (two) times daily as needed for diarrhea or loose stools.    [provider]  mometasone-formoterol (DULERA) 100-5 MCG/ACT AERO Inhale 2 puffs into the lungs 2 (two) times daily. 11/09/18   Fulp, Cammie, MD  OLANZapine (ZYPREXA) 5 MG tablet Take 5 mg by mouth at bedtime.    [provider]  omeprazole (PRILOSEC) 40 MG capsule Take 1 capsule (40 mg total) by mouth 2 (two) times daily. To reduce stomach acid 05/11/18   Fulp, Cammie, MD  predniSONE (DELTASONE) 20 MG tablet 2 tabs po daily x 4 days 11/20/18   Sherwood Gambler, MD    Family History Family History  Problem Relation Age of Onset  . Alcohol abuse Mother   . Arthritis Mother   . Cancer Mother   . Depression Mother   . Early death Mother   . Alcohol abuse Father   . Depression Father   . Alcohol abuse Sister   . Depression Sister   . Alcohol abuse Brother   . Alcohol abuse Daughter   . Depression Daughter   . Alcohol abuse Son   . COPD Maternal Grandfather   . Heart disease Maternal Grandfather   . Hypertension Maternal Grandfather   . Colon cancer Brother        deceased at age 55    Social History Social History   Tobacco Use  . Smoking status: Former Smoker    Packs/day: 1.50    Years: 40.00    Pack years: 60.00    Types: Cigarettes    Quit date: 04/19/2010    Years since quitting: 8.5  . Smokeless tobacco: Never Used  Substance Use Topics  . Alcohol use: No    Comment: Former ETOH. none since 2000  . Drug use: No     Allergies   Patient has no known allergies.   Review of Systems Review of Systems  Constitutional: Negative for fever.  Respiratory: Positive for shortness of breath. Negative for cough.   Cardiovascular: Negative for chest pain and leg swelling.  All other systems reviewed and are  negative.    Physical Exam Updated Vital Signs BP 131/82 (BP Location: Right Arm)   Pulse 83   Temp 98.1 F (36.7 C) (Oral)   Resp 17   Ht 4\' 11"  (1.499 m)   Wt 75 kg   SpO2 93%   BMI 33.40 kg/m   Physical Exam Vitals signs and nursing note reviewed.  Constitutional:      Appearance: She is well-developed.  HENT:     Head: Normocephalic and atraumatic.     Right Ear: External ear normal.     Left Ear: External ear normal.  Nose: Nose normal.  Eyes:     General:        Right eye: No discharge.        Left eye: No discharge.  Cardiovascular:     Rate and Rhythm: Normal rate and regular rhythm.     Heart sounds: Normal heart sounds.  Pulmonary:     Effort: Pulmonary effort is normal. No tachypnea, accessory muscle usage or respiratory distress.     Breath sounds: Decreased breath sounds present.  Abdominal:     Palpations: Abdomen is soft.     Tenderness: There is no abdominal tenderness.  Musculoskeletal:     Right lower leg: No edema.     Left lower leg: No edema.  Skin:    General: Skin is warm and dry.  Neurological:     Mental Status: She is alert.  Psychiatric:        Mood and Affect: Mood is not anxious.      ED Treatments / Results  Labs (all labs ordered are listed, but only abnormal results are displayed) Labs Reviewed  BASIC METABOLIC PANEL - Abnormal; Notable for the following components:      Result Value   Glucose, Bld 108 (*)    Calcium 8.8 (*)    All other components within normal limits  CBC WITH DIFFERENTIAL/PLATELET - Abnormal; Notable for the following components:   RDW 15.9 (*)    All other components within normal limits  BRAIN NATRIURETIC PEPTIDE    EKG None  Radiology Dg Chest Portable 1 View  Result Date: 11/20/2018 CLINICAL DATA:  Dyspnea and COPD EXAM: PORTABLE CHEST 1 VIEW COMPARISON:  11/04/2018 FINDINGS: Haziness at the left base attributed to mediastinal fat at the cardiac apex. Normal heart size and mediastinal  contours. There is no edema, consolidation, effusion, or pneumothorax. Large lung volumes which correlates with history of COPD. IMPRESSION: No evidence of acute disease.  Stable from prior. Electronically Signed   By: Monte Fantasia M.D.   On: 11/20/2018 08:35    Procedures Procedures (including critical care time)  Medications Ordered in ED Medications  albuterol (VENTOLIN HFA) 108 (90 Base) MCG/ACT inhaler 8 puff (8 puffs Inhalation Given 11/20/18 0842)  predniSONE (DELTASONE) tablet 60 mg (60 mg Oral Given 11/20/18 0839)     Initial Impression / Assessment and Plan / ED Course  I have reviewed the triage vital signs and the nursing notes.  Pertinent labs & imaging results that were available during my care of the patient were reviewed by me and considered in my medical decision making (see chart for details).        After albuterol, the patient feels like her symptoms have gone away completely.  She feels well enough for discharge.  She did have some decreased breath sounds originally though no overt wheezing.  However given how many times this is happened to her before as well as no new hypoxia, tachycardia or other concerning findings to suggest PE or other acute/emergent chest process, I think this is likely COPD.  She will be given steroids at home.  Discussed return precautions. Doubt covid given no cough, fever.  ADASIA LEININGER was evaluated in Emergency Department on 11/20/2018 for the symptoms described in the history of present illness. She was evaluated in the context of the global COVID-19 pandemic, which necessitated consideration that the patient might be at risk for infection with the SARS-CoV-2 virus that causes COVID-19. Institutional protocols and algorithms that pertain to the evaluation of  patients at risk for COVID-19 are in a state of rapid change based on information released by regulatory bodies including the CDC and federal and state organizations. These policies  and algorithms were followed during the patient's care in the ED.   Final Clinical Impressions(s) / ED Diagnoses   Final diagnoses:  COPD exacerbation [facility])    ED Discharge Orders         Ordered    predniSONE (DELTASONE) 20 MG tablet     11/20/18 [id number]           Sherwood Gambler, MD 11/20/18 1346

## 2018-11-20 NOTE — Discharge Instructions (Signed)
You were given your first dose of prednisone today.  Start the prescription tomorrow.  Use your albuterol every 4 hours for shortness of breath.  If you develop worsening shortness of breath, fever, blood in your sputum, or any other new/concerning symptoms then return to the ER for evaluation.

## 2018-11-20 NOTE — ED Notes (Signed)
Pt reports improvement in shortness of breath following oxygen administration at 2L nasal.  Respiratory treatment in progress.

## 2018-11-20 NOTE — ED Triage Notes (Addendum)
Pt arrives with stated worsening of copd over past few weeks, worse past 2 days, sat 87% rm air on arrival. Pt has oxygen at home, has needed to use oxygen once a day, using inhalers, little relief.  Exertional shortness of breath.  Denies fever. RT at bedside, oxygen applied at 2L

## 2018-11-20 NOTE — ED Notes (Signed)
On cardiac monitor VS q 30.

## 2018-11-29 MED FILL — ?AMLODIPINE BESYLATE 5MG TA: 5 | 30 days supply | Qty: 30 | Fill #2

## 2018-11-30 ENCOUNTER — Telehealth: Payer: Self-pay | Admitting: Family Medicine

## 2018-11-30 ENCOUNTER — Other Ambulatory Visit: Payer: Self-pay | Admitting: Family Medicine

## 2018-11-30 DIAGNOSIS — I10 Essential (primary) hypertension: Secondary | ICD-10-CM

## 2018-11-30 DIAGNOSIS — J449 Chronic obstructive pulmonary disease, unspecified: Secondary | ICD-10-CM

## 2018-11-30 MED FILL — LISINOPRIL 20 MG TABLET: 20 | 30 days supply | Qty: 60 | Fill #0

## 2018-11-30 NOTE — Progress Notes (Signed)
Patient ID: Monica Scott, female   DOB: Aug 24, 1955, 63 y.o.   MRN: [id number]   Patient with COPD with recurrent exacerbations and requested pulmonology referral. STAT referral placed.

## 2018-11-30 NOTE — Telephone Encounter (Signed)
Patient came to the clinic requesting a pulmonary referral she said she can't breath well .  Thanks

## 2018-11-30 NOTE — Telephone Encounter (Signed)
Urgent pulmonology referral placed for patient

## 2018-12-01 NOTE — Telephone Encounter (Signed)
Noted  

## 2018-12-14 ENCOUNTER — Encounter: Payer: Self-pay | Admitting: Family Medicine

## 2018-12-14 ENCOUNTER — Ambulatory Visit: Payer: Medicaid Other | Attending: Family Medicine | Admitting: Family Medicine

## 2018-12-14 ENCOUNTER — Other Ambulatory Visit: Payer: Self-pay

## 2018-12-14 VITALS — BP 116/76 | HR 85 | Temp 98.9°F | Resp 16 | Wt 168.6 lb

## 2018-12-14 DIAGNOSIS — N819 Female genital prolapse, unspecified: Secondary | ICD-10-CM

## 2018-12-14 DIAGNOSIS — R7303 Prediabetes: Secondary | ICD-10-CM | POA: Diagnosis not present

## 2018-12-14 DIAGNOSIS — J449 Chronic obstructive pulmonary disease, unspecified: Secondary | ICD-10-CM

## 2018-12-14 DIAGNOSIS — M545 Low back pain, unspecified: Secondary | ICD-10-CM

## 2018-12-14 DIAGNOSIS — I10 Essential (primary) hypertension: Secondary | ICD-10-CM | POA: Diagnosis not present

## 2018-12-14 MED ORDER — ALBUTEROL SULFATE (2.5 MG/3ML) 0.083% IN NEBU: 2.5000 mg | INHALATION_SOLUTION | RESPIRATORY_TRACT | 99 refills | Status: DC | PRN

## 2018-12-14 MED ORDER — NAPROXEN 500 MG PO TABS: 500.0000 mg | ORAL_TABLET | Freq: Two times a day (BID) | ORAL | 2 refills | Status: DC

## 2018-12-14 MED ORDER — AMLODIPINE BESYLATE 5 MG PO TABS: ORAL_TABLET | ORAL | 1 refills | Status: DC

## 2018-12-14 NOTE — Progress Notes (Signed)
Established Patient Office Visit  Subjective:  Patient ID: Monica Scott, female    DOB: [birth year]  Age: 63 y.o. MRN: [id number]  CC:  Chief Complaint  Patient presents with   COPD   Medication Refill    HPI AYERIM BLANCHER presents for follow-up of recent emergency department visit on 11/20/2018 due to increased shortness of breath and patient was diagnosed with COPD exacerbation and treated with albuterol nebulizer treatments and prednisone as well as prescription for prednisone at discharge.  Patient also left message with the clinic on 11/30/2018 requesting pulmonology referral due to her chronic breathing issues and new referral was placed at that time.  She reports that she has not yet been notified of the pulmonology appointment date or time.  She is having increasing difficulty with shortness of breath with exertion.  She continues to work at [employer] and has difficulty walking from the parking lot to her job.  Patient has been told that she needs supplemental oxygen in the past and patient states that she does use oxygen at home when she feels that she needs it or often uses this at bedtime and sleeps with oxygen on.         She also reports need for refill of blood pressure medication.  Blood pressures been well controlled and she has had no headaches or dizziness related to her blood pressure.  She does report that she has had issues with low back pain which is recurrent but has been more constant recently and extends across her lower back.  Back pain is a 7-8 on a 0-to-10 scale and gets worse throughout the workday and she has to stand for long periods.  She reports no recent falls or injury.  At this time, the pain does not radiate down either leg.  She also requests referral to see a specialist regarding possible prolapse of the bladder or uterus.  She states that she has been told in the past that she had issues with prolapse but now she can feel that something is abnormal when she  is wiping after using the restroom.  She sometimes feels as though she does not empty her bladder fully.  She denies any current issues with dysuria.  No lower abdominal or pelvic pain.  Past Medical History:  Diagnosis Date   Arthritis    Asthma    Bladder disease    COPD (chronic obstructive pulmonary disease) (HCC)    Depression    GERD (gastroesophageal reflux disease)    Hyperlipidemia    Hypertension    Peptic ulcer     Past Surgical History:  Procedure Laterality Date   ABDOMINAL HYSTERECTOMY     BIOPSY  04/24/2018   Procedure: BIOPSY;  Surgeon: Daneil Dolin, MD;  [facility];  Service: Endoscopy;;  esophagus   COLONOSCOPY WITH PROPOFOL N/A 04/24/2018   Procedure: COLONOSCOPY WITH PROPOFOL;  Surgeon: Daneil Dolin, MD;  [facility];  Service: Endoscopy;  Laterality: N/A;  9:00am   ESOPHAGOGASTRODUODENOSCOPY (EGD) WITH PROPOFOL N/A 04/24/2018   Procedure: ESOPHAGOGASTRODUODENOSCOPY (EGD) WITH PROPOFOL;  Surgeon: Daneil Dolin, MD;  [facility];  Service: Endoscopy;  Laterality: N/A;   MALONEY DILATION N/A 04/24/2018   Procedure: Venia Minks DILATION;  Surgeon: Daneil Dolin, MD;  [facility];  Service: Endoscopy;  Laterality: N/A;   PILONIDAL CYST EXCISION     age 45   POLYPECTOMY  04/24/2018   Procedure: POLYPECTOMY;  Surgeon: [name],  Cristopher Estimable, MD;  [facility];  Service: Endoscopy;;  colon     Family History  Problem Relation Age of Onset   Alcohol abuse Mother    Arthritis Mother    Cancer Mother    Depression Mother    Early death Mother    Alcohol abuse Father    Depression Father    Alcohol abuse Sister    Depression Sister    Alcohol abuse Brother    Alcohol abuse Daughter    Depression Daughter    Alcohol abuse Son    COPD Maternal Grandfather    Heart disease Maternal Grandfather    Hypertension Maternal Grandfather    Colon cancer Brother        deceased at  age 27    Social History   Socioeconomic History   Marital status: Single    Spouse name: Not on file   Number of children: Not on file   Years of education: Not on file   Highest education level: Not on file  Occupational History   Not on file  Social Needs   Financial resource strain: Not on file   Food insecurity    Worry: Not on file    Inability: Not on file   Transportation needs    Medical: Not on file    Non-medical: Not on file  Tobacco Use   Smoking status: Former Smoker    Packs/day: 1.50    Years: 40.00    Pack years: 60.00    Types: Cigarettes    Quit date: 04/19/2010    Years since quitting: 8.6   Smokeless tobacco: Never Used  Substance and Sexual Activity   Alcohol use: No    Comment: Former ETOH. none since 2000   Drug use: No   Sexual activity: Yes    Birth control/protection: Surgical  Lifestyle   Physical activity    Days per week: Not on file    Minutes per session: Not on file   Stress: Not on file  Relationships   Social connections    Talks on phone: Not on file    Gets together: Not on file    Attends religious service: Not on file    Active member of club or organization: Not on file    Attends meetings of clubs or organizations: Not on file    Relationship status: Not on file   Intimate partner violence    Fear of current or ex partner: Not on file    Emotionally abused: Not on file    Physically abused: Not on file    Forced sexual activity: Not on file  Other Topics Concern   Not on file  Social History Narrative   Not on file    Outpatient Medications Prior to Visit  Medication Sig Dispense Refill   albuterol (PROVENTIL) (2.5 MG/3ML) 0.083% nebulizer solution Take 3 mLs (2.5 mg total) by nebulization every 4 (four) hours as needed for wheezing or shortness of breath. 180 mL 0   albuterol (VENTOLIN HFA) 108 (90 Base) MCG/ACT inhaler Inhale 1-2 puffs into the lungs every 6 (six) hours as needed for wheezing  or shortness of breath. 6.7 g 0   amLODipine (NORVASC) 5 MG tablet TAKE 1 TABLET BY MOUTH DAILY. 90 tablet 0   Aspirin-Caffeine (BC FAST PAIN RELIEF PO) Take 1 packet by mouth daily as needed (pain).     cyclobenzaprine (FLEXERIL) 5 MG tablet Take 1 tablet (5 mg total) by mouth 2 (  two) times daily as needed for muscle spasms. 20 tablet 0   FLUoxetine (PROZAC) 20 MG tablet Take 20 mg by mouth daily.     gabapentin (NEURONTIN) 400 MG capsule Take 1 capsule (400 mg total) by mouth 3 (three) times daily. 90 capsule 11   ibuprofen (ADVIL,MOTRIN) 200 MG tablet Take 400 mg by mouth 3 (three) times daily as needed for moderate pain.     lisinopril (ZESTRIL) 20 MG tablet TAKE 1 TABLET BY MOUTH 2 TIMES DAILY. 60 tablet 2   loperamide (IMODIUM A-D) 2 MG tablet Take 4 mg by mouth 2 (two) times daily as needed for diarrhea or loose stools.     mometasone-formoterol (DULERA) 100-5 MCG/ACT AERO Inhale 2 puffs into the lungs 2 (two) times daily. 13 g 2   OLANZapine (ZYPREXA) 5 MG tablet Take 5 mg by mouth at bedtime.     omeprazole (PRILOSEC) 40 MG capsule Take 1 capsule (40 mg total) by mouth 2 (two) times daily. To reduce stomach acid 60 capsule 3   predniSONE (DELTASONE) 20 MG tablet 2 tabs po daily x 4 days 8 tablet 0   No facility-administered medications prior to visit.     No Known Allergies  ROS Review of Systems  Constitutional: Positive for fatigue. Negative for chills and fever.  HENT: Negative for sore throat and trouble swallowing.   Eyes: Negative for photophobia and visual disturbance.  Respiratory: Positive for cough (occasional; not currently productuve) and shortness of breath.   Cardiovascular: Negative for chest pain, palpitations and leg swelling.  Gastrointestinal: Negative for abdominal distention, abdominal pain, constipation, diarrhea and nausea.  Endocrine: Negative for polydipsia, polyphagia and polyuria.  Genitourinary: Positive for difficulty urinating. Negative  for dysuria and frequency.  Musculoskeletal: Positive for arthralgias and back pain.  Neurological: Negative for dizziness and headaches.  Hematological: Negative for adenopathy. Does not bruise/bleed easily.  Psychiatric/Behavioral: Negative for self-injury and suicidal ideas. The patient is nervous/anxious.       Objective:    Physical Exam  Constitutional: She is oriented to person, place, and time. She appears well-developed and well-nourished.  Well-nourished well-developed overweight older female in no acute distress but patient appears fatigued.  She is wearing facial covering/mask as per office [id number] policy  Neck: Neck supple. No JVD present.  Cardiovascular: Normal rate and regular rhythm.  Pulmonary/Chest: Effort normal and breath sounds normal. She has no wheezes.  Abdominal: Soft. There is no abdominal tenderness. There is no rebound and no guarding.  Musculoskeletal:        General: Tenderness present. No edema.     Comments: No CVA tenderness, patient with lumbosacral tenderness to palpation and throughout the lumbar paraspinous spasm  Lymphadenopathy:    She has no cervical adenopathy.  Neurological: She is alert and oriented to person, place, and time.  Psychiatric: She has a normal mood and affect. Her behavior is normal.  Nursing note and vitals reviewed.   BP (!) 167/99    Pulse 85    Temp 98.9 F (37.2 C) (Oral)    Resp 16    Wt 168 lb 9.6 oz (76.5 kg)    SpO2 (!) 88%    BMI 34.05 kg/m  Wt Readings from Last 3 Encounters:  12/14/18 168 lb 9.6 oz (76.5 kg)  11/20/18 165 lb 5.5 oz (75 kg)  11/04/18 165 lb (74.8 kg)     Health Maintenance Due  Topic Date Due   Hepatitis C Screening  15-Jun-1955   TETANUS/TDAP  09/29/1974  PAP SMEAR-Modifier  09/28/1976   MAMMOGRAM  12/01/2014   INFLUENZA VACCINE  10/28/2018    *Patient works at [employer] and states that she received her influenza immunization already at work   Lab Results  Component Value Date     WBC 8.7 11/20/2018   HGB 12.5 11/20/2018   HCT 39.8 11/20/2018   MCV 91.1 11/20/2018   PLT 364 11/20/2018   Lab Results  Component Value Date   NA 138 11/20/2018   K 3.7 11/20/2018   CO2 26 11/20/2018   GLUCOSE 108 (H) 11/20/2018   BUN 11 11/20/2018   CREATININE 0.78 11/20/2018   BILITOT 0.2 12/06/2017   ALKPHOS 128 (H) 12/06/2017   AST 14 12/06/2017   ALT 12 12/06/2017   PROT 6.2 12/06/2017   ALBUMIN 4.2 12/06/2017   CALCIUM 8.8 (L) 11/20/2018   ANIONGAP 11 11/20/2018   Lab Results  Component Value Date   CHOL 249 (H) 10/18/2017   Lab Results  Component Value Date   HDL 64 10/18/2017   Lab Results  Component Value Date   LDLCALC 164 (H) 10/18/2017   Lab Results  Component Value Date   TRIG 106 10/18/2017   Lab Results  Component Value Date   CHOLHDL 3.9 10/18/2017   Lab Results  Component Value Date   HGBA1C 6.1 (H) 10/18/2017      Assessment & Plan:  1. Chronic obstructive pulmonary disease, unspecified COPD type ([place]) Patient reports worsening shortness of breath with exertion due to her COPD.  Patient will be referred to pulmonology for further evaluation and treatment.  She is also encouraged to use her home oxygen on a more regular basis. - Ambulatory referral to Pulmonology  2. Prediabetes Patient with history of COPD and has often been prescribed prednisone to help with her symptoms.  Patient with hemoglobin A1c done last July which was elevated at 6.1 consistent with prediabetes.  Patient will have repeat hemoglobin A1c at today's visit.  Discussed low carbohydrate/no concentrated sweet diet.  Patient believes that she has gained weight due to decreased level of activity due to her shortness of breath from COPD and this may be contributing to her prediabetes. - Hemoglobin A1c  3. Essential hypertension Blood pressure elevated at today's visit initially but lower on recheck.  New prescription provided for refill of amlodipine 5 mg to take daily to  help with hypertension.  Low-sodium diet also encouraged  4. Acute midline low back pain without sciatica Order placed for patient to have x-ray of the lower back/lumbar spine if she continues to have back pain.  Prescription provided for naproxen to take as needed after eating to help with back pain. - DG Lumbar Spine Complete; Future - naproxen (NAPROSYN) 500 MG tablet; Take 1 tablet (500 mg total) by mouth 2 (two) times daily with a meal. As needed for pain  Dispense: 60 tablet; Refill: 2  5. Female genital prolapse, unspecified type Patient with complaint of possible bladder or uterine prolapse and perhaps both.  She will be referred to urogynecology for further evaluation and treatment - Ambulatory referral to Gynecology   An After Visit Summary was printed and given to the patient.  Follow-up: Return in about 5 months (around 05/16/2019) for Chronic issues; and as needed.   [name], MD

## 2018-12-15 LAB — HEMOGLOBIN A1C
Est. average glucose Bld gHb Est-mCnc: 126 mg/dL
Hgb A1c MFr Bld: 6 % — ABNORMAL HIGH (ref 4.8–5.6)

## 2018-12-19 ENCOUNTER — Other Ambulatory Visit: Payer: Self-pay | Admitting: Family Medicine

## 2018-12-21 ENCOUNTER — Telehealth: Payer: Self-pay | Admitting: Family Medicine

## 2018-12-21 NOTE — Telephone Encounter (Signed)
New Message   Pt called requesting prednisone due to having breathing issues. Please f/u

## 2018-12-22 ENCOUNTER — Other Ambulatory Visit: Payer: Self-pay | Admitting: Family Medicine

## 2018-12-22 DIAGNOSIS — J449 Chronic obstructive pulmonary disease, unspecified: Secondary | ICD-10-CM

## 2018-12-22 MED ORDER — PREDNISONE 20 MG PO TABS: ORAL_TABLET | ORAL | 0 refills | Status: DC

## 2018-12-22 NOTE — Telephone Encounter (Signed)
Please notify patient that RX has been sent into her pharmacy for prednisone but if she is feeling worse this weekend, please go to the ED. She should also keep her upcoming appointment with pulmonology on 12/26/2018

## 2018-12-22 NOTE — Progress Notes (Signed)
Patient ID: CAMBRIDGE HAYMON, female   DOB: 08/29/1955, 63 y.o.   MRN: [id number]   Phone message received that patient is requesting a RX for prednisone for her breathing. Rx sent to [facility] and she will be notified to go to the ED if her symptoms worsen and keep f/u with pulmonology on 12/26/2018

## 2018-12-25 NOTE — Telephone Encounter (Signed)
Informed patient with what provider stated and she verbalized understanding.  

## 2018-12-26 ENCOUNTER — Institutional Professional Consult (permissible substitution): Payer: Medicaid Other | Admitting: Internal Medicine

## 2019-01-02 ENCOUNTER — Encounter: Payer: Self-pay | Admitting: Pulmonary Disease

## 2019-01-02 ENCOUNTER — Ambulatory Visit: Payer: Medicaid Other | Admitting: Pulmonary Disease

## 2019-01-02 ENCOUNTER — Other Ambulatory Visit: Payer: Self-pay

## 2019-01-02 DIAGNOSIS — J449 Chronic obstructive pulmonary disease, unspecified: Secondary | ICD-10-CM | POA: Diagnosis not present

## 2019-01-02 MED ORDER — OLMESARTAN MEDOXOMIL 40 MG PO TABS: 40.0000 mg | ORAL_TABLET | Freq: Every day | ORAL | 1 refills | Status: DC

## 2019-01-02 MED ORDER — MOMETASONE FURO-FORMOTEROL FUM 100-5 MCG/ACT IN AERO: 2.0000 | INHALATION_SPRAY | Freq: Two times a day (BID) | RESPIRATORY_TRACT | 3 refills | Status: DC

## 2019-01-02 MED ORDER — IPRATROPIUM-ALBUTEROL 0.5-2.5 (3) MG/3ML IN SOLN: 3.0000 mL | Freq: Four times a day (QID) | RESPIRATORY_TRACT | 1 refills | Status: DC | PRN

## 2019-01-02 MED ORDER — ALBUTEROL SULFATE HFA 108 (90 BASE) MCG/ACT IN AERS: 1.0000 | INHALATION_SPRAY | Freq: Four times a day (QID) | RESPIRATORY_TRACT | 3 refills | Status: DC | PRN

## 2019-01-02 NOTE — Patient Instructions (Signed)
  Prescription for albuterol plus Atrovent nebs/DuoNebs instead of albuterol alone-every 6 hours and as needed Stay on [facility]  Schedule echocardiogram to check out your heart.  If you are unable to obtain lung function testing during disability evaluation, then let us know and we can schedule PFTs.  STOP taking lisinopril. Prescription for Benicar 40 mg daily instead-check your blood pressure over the next 2 weeks and report back if high

## 2019-01-02 NOTE — Progress Notes (Signed)
Subjective:    Patient ID: Monica Scott, female    DOB: 05/25/1955, 63 y.o.   MRN: [id number]  HPI  63 year old heavy ex-smoker who carries a diagnosis of COPD presents for evaluation of dyspnea. She has been experiencing worsening dyspnea and wheezing over the past 2 months has required repeated steroid tapers.  Her PCP has given her 40 mg prednisone taper on at least 2 occasions and she has 2 ED visits in August both times she got 60 mg taper over a week. Her last hospitalization was 09/2017 for COPD exacerbation where she was found to be hypoxic and placed on oxygen.  She finds herself using oxygen more frequently over the last few months.  She reports congestion and has a dry cough on occasion with some throat clearing.  She denies seasonal allergies.  There is no childhood history of asthma.  She smoked 2 packs/day, more than 60 pack years before she quit in 2010.  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  She denies any sleep-related problems and no bed partner history is available. She has a neurogenic bladder and self catheterizes at least 4-6 times per day, denies frequent UTIs  Due to worsening dyspnea and more frequent oxygen use, she is going to apply for disability She denies any cardiac issues.  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 Arthritis   . Asthma   . Bladder disease   . COPD (chronic obstructive pulmonary disease) ([place])   . Depression   . GERD (gastroesophageal reflux disease)   . Hyperlipidemia   . Hypertension   . Peptic ulcer    Past Surgical History:  Procedure Laterality Date  . ABDOMINAL HYSTERECTOMY    . BIOPSY  04/24/2018   Procedure: BIOPSY;  Surgeon: Daneil Dolin, MD;  [facility];  Service: Endoscopy;;  esophagus  .  COLONOSCOPY WITH PROPOFOL N/A 04/24/2018   Procedure: COLONOSCOPY WITH PROPOFOL;  Surgeon: Daneil Dolin, MD;  [facility];  Service: Endoscopy;  Laterality: N/A;  9:00am  . ESOPHAGOGASTRODUODENOSCOPY (EGD) WITH PROPOFOL N/A 04/24/2018   Procedure: ESOPHAGOGASTRODUODENOSCOPY (EGD) WITH PROPOFOL;  Surgeon: Daneil Dolin, MD;  [facility];  Service: Endoscopy;  Laterality: N/A;  Venia Minks DILATION N/A 04/24/2018   Procedure: Venia Minks DILATION;  Surgeon: Daneil Dolin, MD;  [facility];  Service: Endoscopy;  Laterality: N/A;  . PILONIDAL CYST EXCISION     age 67  . POLYPECTOMY  04/24/2018   Procedure: POLYPECTOMY;  Surgeon: Daneil Dolin, MD;  [facility];  Service: Endoscopy;;  colon     No Known Allergies  Social History   Socioeconomic History  . Marital status: Single    Spouse name: Not on file  . Number of children: Not on file  . Years of education: Not on file  . Highest education level: Not on file  Occupational History  . Not on file  Social Needs  . Financial resource strain: Not on file  . Food insecurity    Worry: Not on file    Inability: Not on file  . Transportation needs    Medical: Not on file    Non-medical: Not on file  Tobacco Use  . Smoking status: Former Smoker  Packs/day: 1.50    Years: 40.00    Pack years: 60.00    Types: Cigarettes    Quit date: 04/19/2010    Years since quitting: 8.7  . Smokeless tobacco: Never Used  Substance and Sexual Activity  . Alcohol use: No    Comment: Former ETOH. none since 2000  . Drug use: No  . Sexual activity: Yes    Birth control/protection: Surgical  Lifestyle  . Physical activity    Days per week: Not on file    Minutes per session: Not on file  . Stress: Not on file  Relationships  . Social Herbalist on phone: Not on file    Gets together: Not on file    Attends religious service: Not on file    Active member of club or organization: Not  on file    Attends meetings of clubs or organizations: Not on file    Relationship status: Not on file  . Intimate partner violence    Fear of current or ex partner: Not on file    Emotionally abused: Not on file    Physically abused: Not on file    Forced sexual activity: Not on file  Other Topics Concern  . Not on file  Social History Narrative  . Not on file      Family History  Problem Relation Age of Onset  . Alcohol abuse Mother   . Arthritis Mother   . Cancer Mother   . Depression Mother   . Early death Mother   . Alcohol abuse Father   . Depression Father   . Alcohol abuse Sister   . Depression Sister   . Alcohol abuse Brother   . Alcohol abuse Daughter   . Depression Daughter   . Alcohol abuse Son   . COPD Maternal Grandfather   . Heart disease Maternal Grandfather   . Hypertension Maternal Grandfather   . Colon cancer Brother        deceased at age 90      Review of Systems   Constitutional: negative for anorexia, fevers and sweats  Eyes: negative for irritation, redness and visual disturbance  Ears, nose, mouth, throat, and face: negative for earaches, epistaxis, nasal congestion and sore throat  Respiratory: negative for cough,  sputum and wheezing  Cardiovascular: negative for chest pain, lower extremity edema, orthopnea, palpitations and syncope  Gastrointestinal: negative for abdominal pain, constipation, diarrhea, melena, nausea and vomiting  Genitourinary:negative for dysuria, frequency and hematuria  Hematologic/lymphatic: negative for bleeding, easy bruising and lymphadenopathy  Musculoskeletal:negative for arthralgias, muscle weakness and stiff joints  Neurological: negative for coordination problems, gait problems, headaches and weakness  Endocrine: negative for diabetic symptoms including polydipsia, polyuria and weight loss     Objective:   Physical Exam  Gen. Pleasant, obese, in no distress, normal affect ENT - no pallor,icterus, no  post nasal drip, class 2 airway Neck: No JVD, no thyromegaly, no carotid bruits Lungs: no use of accessory muscles, no dullness to percussion, decreased without rales or rhonchi  Cardiovascular: Rhythm regular, heart sounds  normal, no murmurs or gallops, no peripheral edema Abdomen: soft and non-tender, no hepatosplenomegaly, BS normal. Musculoskeletal: No deformities, no cyanosis or clubbing Neuro:  alert, non focal, no tremors

## 2019-01-02 NOTE — Progress Notes (Signed)
benicar Subjective:     Patient ID: Monica Scott, female   DOB: May 29, 1955, 63 y.o.   MRN: [id number]  HPI   Review of Systems  Constitutional: Positive for chills.  HENT: Negative.   Eyes: Negative.   Respiratory: Positive for shortness of breath and wheezing.   Cardiovascular: Negative.   Gastrointestinal: Negative.   Endocrine: Negative.   Genitourinary: Negative.   Musculoskeletal: Positive for back pain.  Skin: Negative.   Allergic/Immunologic: Negative.   Neurological: Negative.   Hematological: Bruises/bleeds easily.  Psychiatric/Behavioral: Negative.

## 2019-01-02 NOTE — Assessment & Plan Note (Addendum)
Prescription for albuterol plus Atrovent nebs/DuoNebs instead of albuterol alone-every 6 hours and as needed Stay on [facility]  If you are unable to obtain lung function testing during disability evaluation, then let us know and we can schedule PFTs. She will continue to use oxygen during exertion and sleep. We will look for alternative etiologies other than COPD including cardiac and lisinopril  She would be a good candidate for pulmonary rehab in the future. Sleep evaluation in the future to look for overlap syndrome

## 2019-01-02 NOTE — Assessment & Plan Note (Signed)
Schedule echocardiogram to check out your heart. STOP taking lisinopril. Prescription for Benicar 40 mg daily instead-check your blood pressure over the next 2 weeks and report back if high

## 2019-01-05 ENCOUNTER — Telehealth: Payer: Self-pay | Admitting: Pulmonary Disease

## 2019-01-05 NOTE — Telephone Encounter (Signed)
Medication name and strength: Olmesartan Medoxomil 40mg  Provider: Dr. Elsworth Soho Pharmacy: [facility] Patient insurance ID: [id number] N Phone: [phone]- [place] [employer]  PA approval/denial 24 hours PA # [id number]  Will route to [facility], CMA to follow up

## 2019-01-11 ENCOUNTER — Emergency Department (HOSPITAL_BASED_OUTPATIENT_CLINIC_OR_DEPARTMENT_OTHER)
Admission: EM | Admit: 2019-01-11 | Discharge: 2019-01-11 | Disposition: A | Discharge status: Home / Self Care | Payer: Medicaid Other | Attending: Emergency Medicine | Admitting: Emergency Medicine

## 2019-01-11 ENCOUNTER — Encounter (HOSPITAL_BASED_OUTPATIENT_CLINIC_OR_DEPARTMENT_OTHER): Payer: Self-pay | Admitting: Emergency Medicine

## 2019-01-11 ENCOUNTER — Other Ambulatory Visit: Payer: Self-pay

## 2019-01-11 DIAGNOSIS — Z87891 Personal history of nicotine dependence: Secondary | ICD-10-CM | POA: Insufficient documentation

## 2019-01-11 DIAGNOSIS — Z7982 Long term (current) use of aspirin: Secondary | ICD-10-CM | POA: Insufficient documentation

## 2019-01-11 DIAGNOSIS — I10 Essential (primary) hypertension: Secondary | ICD-10-CM | POA: Insufficient documentation

## 2019-01-11 DIAGNOSIS — K0889 Other specified disorders of teeth and supporting structures: Secondary | ICD-10-CM

## 2019-01-11 DIAGNOSIS — J449 Chronic obstructive pulmonary disease, unspecified: Secondary | ICD-10-CM | POA: Insufficient documentation

## 2019-01-11 DIAGNOSIS — I2510 Atherosclerotic heart disease of native coronary artery without angina pectoris: Secondary | ICD-10-CM | POA: Insufficient documentation

## 2019-01-11 DIAGNOSIS — E785 Hyperlipidemia, unspecified: Secondary | ICD-10-CM | POA: Diagnosis not present

## 2019-01-11 DIAGNOSIS — Z79899 Other long term (current) drug therapy: Secondary | ICD-10-CM | POA: Diagnosis not present

## 2019-01-11 DIAGNOSIS — R220 Localized swelling, mass and lump, head: Secondary | ICD-10-CM | POA: Diagnosis not present

## 2019-01-11 MED ORDER — PENICILLIN V POTASSIUM 500 MG PO TABS: 500.0000 mg | ORAL_TABLET | Freq: Four times a day (QID) | ORAL | 0 refills | Status: AC

## 2019-01-11 MED ORDER — LIDOCAINE VISCOUS HCL 2 % MT SOLN: 15.0000 mL | OROMUCOSAL | 0 refills | Status: DC | PRN

## 2019-01-11 MED ORDER — KETOROLAC TROMETHAMINE 30 MG/ML IJ SOLN
30.0000 mg | Freq: Once | INTRAMUSCULAR | Status: AC
  Administered 2019-01-11: 30 mg via INTRAMUSCULAR
  Filled 2019-01-11: qty 1

## 2019-01-11 MED FILL — PENICILLIN VK 500 MG TABLET: 500 | 7 days supply | Qty: 28 | Fill #0

## 2019-01-11 MED FILL — LIDOCAINE 2% VISCOUS SOLN: 2 | 7 days supply | Qty: 100 | Fill #0

## 2019-01-11 NOTE — Telephone Encounter (Signed)
Called [facility] to see if they are able to process the prescription sent as no confirmation has been received from [facility]. Line rang multiple times without pick up. Will try again later.

## 2019-01-11 NOTE — ED Notes (Signed)
Pt verbalized understanding of dc instructions.

## 2019-01-11 NOTE — ED Provider Notes (Signed)
[facility] EMERGENCY DEPARTMENT Provider Note   CSN: [id number] Arrival date & time: 01/11/19  [id number]     History   Chief Complaint Chief Complaint  Patient presents with  . Dental Pain    HPI Monica Scott is a 63 y.o. female with history of COPD, GERD, hyperlipidemia, hypertension, arthritis presents for evaluation of acute onset, progressively worsening left maxillary dental pain for 3 days.  Reports pain is constant, throbbing, radiates all over her face.  Last night developed left-sided facial swelling which extends into the cheek and left lower eyelid.  Denies eye pain or restricted eye movements, no vision changes.  Denies fever, difficulty breathing or swallowing, drooling.  No aggravating or alleviating factors noted.  She has been taking Tylenol without significant relief.  She does not have a dentist with whom she can follow-up due to financial constraints.     The history is provided by the patient.    Past Medical History:  Diagnosis Date  . Arthritis   . Asthma   . Bladder disease   . COPD (chronic obstructive pulmonary disease) ([place])   . Depression   . GERD (gastroesophageal reflux disease)   . Hyperlipidemia   . Hypertension   . Peptic ulcer     Patient Active Problem List   Diagnosis Date Noted  . Emphysema of lung ([employer]) 06/14/2018  . Aortic atherosclerosis ([place]) 06/14/2018  . Atherosclerosis of coronary artery 06/14/2018  . Thoracic aortic ectasia ([place]) 05/19/2018  . Esophageal dysphagia 03/01/2018  . Family history of colon cancer 03/01/2018  . Diarrhea 03/01/2018  . Near syncope 10/17/2017  . Hyperlipidemia 10/17/2017  . GERD (gastroesophageal reflux disease) 10/17/2017  . Bladder disease or syndrome 10/17/2017  . Depression 10/17/2017  . Hypertension, essential, benign 02/19/2017  . COPD (chronic obstructive pulmonary disease) ([place]) 02/11/2017    Past Surgical History:  Procedure Laterality Date  . ABDOMINAL HYSTERECTOMY    .  BIOPSY  04/24/2018   Procedure: BIOPSY;  Surgeon: Daneil Dolin, MD;  Location: [facility];  Service: Endoscopy;;  esophagus  . COLONOSCOPY WITH PROPOFOL N/A 04/24/2018   Procedure: COLONOSCOPY WITH PROPOFOL;  Surgeon: Daneil Dolin, MD;  Location: [facility];  Service: Endoscopy;  Laterality: N/A;  9:00am  . ESOPHAGOGASTRODUODENOSCOPY (EGD) WITH PROPOFOL N/A 04/24/2018   Procedure: ESOPHAGOGASTRODUODENOSCOPY (EGD) WITH PROPOFOL;  Surgeon: Daneil Dolin, MD;  Location: [facility];  Service: Endoscopy;  Laterality: N/A;  Venia Minks DILATION N/A 04/24/2018   Procedure: Venia Minks DILATION;  Surgeon: Daneil Dolin, MD;  Location: [facility];  Service: Endoscopy;  Laterality: N/A;  . PILONIDAL CYST EXCISION     age 75  . POLYPECTOMY  04/24/2018   Procedure: POLYPECTOMY;  Surgeon: Daneil Dolin, MD;  Location: [facility];  Service: Endoscopy;;  colon      OB History   No obstetric history on file.      Home Medications    Prior to Admission medications   Medication Sig Start Date End Date Taking? Authorizing Provider  albuterol (VENTOLIN HFA) 108 (90 Base) MCG/ACT inhaler Inhale 1-2 puffs into the lungs every 6 (six) hours as needed for wheezing or shortness of breath. 01/02/19   Rigoberto Noel, MD  amLODipine (NORVASC) 5 MG tablet One pill twice daily to lower blood pressure 12/14/18   Fulp, Cammie, MD  Aspirin-Caffeine (BC FAST PAIN RELIEF PO) Take 1 packet by mouth daily as needed (pain).    [provider]  cyclobenzaprine (FLEXERIL) 5 MG tablet Take 1 tablet (5 mg total) by mouth 2 (two) times daily as needed for muscle spasms. 10/30/18   Fulp, Cammie, MD  FLUoxetine (PROZAC) 20 MG tablet Take 20 mg by mouth daily.    [provider]  gabapentin (NEURONTIN) 400 MG capsule Take 1 capsule (400 mg total) by mouth 3 (three) times daily. 12/06/17   Fulp, Cammie, MD  ibuprofen (ADVIL,MOTRIN) 200 MG tablet Take 400 mg by mouth 3 (three) times daily as needed for  moderate pain.    [provider]  ipratropium-albuterol (DUONEB) 0.5-2.5 (3) MG/3ML SOLN Take 3 mLs by nebulization every 6 (six) hours as needed. 01/02/19   Rigoberto Noel, MD  lidocaine (XYLOCAINE) 2 % solution Use as directed 15 mLs in the mouth or throat as needed for mouth pain. 01/11/19   Janara Klett A, PA-C  loperamide (IMODIUM A-D) 2 MG tablet Take 4 mg by mouth 2 (two) times daily as needed for diarrhea or loose stools.    [provider]  mometasone-formoterol (DULERA) 100-5 MCG/ACT AERO Inhale 2 puffs into the lungs 2 (two) times daily. 01/02/19   Rigoberto Noel, MD  naproxen (NAPROSYN) 500 MG tablet Take 1 tablet (500 mg total) by mouth 2 (two) times daily with a meal. As needed for pain 12/14/18   Fulp, Cammie, MD  OLANZapine (ZYPREXA) 5 MG tablet Take 5 mg by mouth at bedtime.    [provider]  olmesartan (BENICAR) 40 MG tablet Take 1 tablet (40 mg total) by mouth daily. 01/02/19   Rigoberto Noel, MD  omeprazole (PRILOSEC) 40 MG capsule Take 1 capsule (40 mg total) by mouth 2 (two) times daily. To reduce stomach acid 05/11/18   Fulp, Cammie, MD  penicillin v potassium (VEETID) 500 MG tablet Take 1 tablet (500 mg total) by mouth 4 (four) times daily for 7 days. 01/11/19 01/18/19  Rodell Perna A, PA-C  predniSONE (DELTASONE) 20 MG tablet 2 pills once per day x 5 days. Take after eating 12/22/18   [name], MD    Family History Family History  Problem Relation Age of Onset  . Alcohol abuse Mother   . Arthritis Mother   . Cancer Mother   . Depression Mother   . Early death Mother   . Alcohol abuse Father   . Depression Father   . Alcohol abuse Sister   . Depression Sister   . Alcohol abuse Brother   . Alcohol abuse Daughter   . Depression Daughter   . Alcohol abuse Son   . COPD Maternal Grandfather   . Heart disease Maternal Grandfather   . Hypertension Maternal Grandfather   . Colon cancer Brother        deceased at age 68    Social  History Social History   Tobacco Use  . Smoking status: Former Smoker    Packs/day: 1.50    Years: 40.00    Pack years: 60.00    Types: Cigarettes    Quit date: 04/19/2010    Years since quitting: 8.7  . Smokeless tobacco: Never Used  Substance Use Topics  . Alcohol use: No    Comment: Former ETOH. none since 2000  . Drug use: No     Allergies   Patient has no known allergies.   Review of Systems Review of Systems  Constitutional: Negative for chills and fever.  HENT: Positive for dental problem and facial swelling.   Eyes: Negative for photophobia, pain and  visual disturbance.     Physical Exam Updated Vital Signs BP (!) 151/93 (BP Location: Right Arm)   Pulse 98   Temp 99 F (37.2 C) (Oral)   Resp (!) 22   Ht 4\' 11"  (1.499 m)   Wt 74.8 kg   SpO2 93%   BMI 33.33 kg/m   Physical Exam Vitals signs and nursing note reviewed.  Constitutional:      General: She is not in acute distress.    Appearance: She is well-developed.  HENT:     Head: Atraumatic.     Mouth/Throat:     Comments: No trismus, tolerating oral secretions without difficulty, no abnormal phonation.  Mouth opens to at least 3 finger widths.  Diffusely decaying dentition with maxillary dentition mostly edentulous.  Tenderness to percussion of the gingiva around to the incisors and left canine.  Some swelling of the vestibular mucosa but no drainable abscess noted.  Some facial swelling around the left cheek extending into the left lower eyelid.  No submental, subglossal, or submandibular swelling or tenderness.  No tenderness to palpation of the anterior structures of the neck. Eyes:     General:        Right eye: No discharge.        Left eye: No discharge.     Extraocular Movements: Extraocular movements intact.     Conjunctiva/sclera: Conjunctivae normal.     Pupils: Pupils are equal, round, and reactive to light.     Comments: No pain with EOMs, no restriction with EOMs.  No purulent drainage  noted from the left eye.  Left lower eyelid with swelling and mild tenderness.  No erythema.  Neck:     Musculoskeletal: Normal range of motion and neck supple. No neck rigidity.     Vascular: No JVD.     Trachea: No tracheal deviation.  Cardiovascular:     Rate and Rhythm: Normal rate.  Pulmonary:     Effort: Pulmonary effort is normal.  Abdominal:     General: There is no distension.  Skin:    General: Skin is warm and dry.     Findings: No erythema.  Neurological:     Mental Status: She is alert.  Psychiatric:        Behavior: Behavior normal.      ED Treatments / Results  Labs (all labs ordered are listed, but only abnormal results are displayed) Labs Reviewed - No data to display  EKG None  Radiology No results found.  Procedures Procedures (including critical care time)  Medications Ordered in ED Medications  ketorolac (TORADOL) 30 MG/ML injection 30 mg (30 mg Intramuscular Given 01/11/19 0916)     Initial Impression / Assessment and Plan / ED Course  I have reviewed the triage vital signs and the nursing notes.  Pertinent labs & imaging results that were available during my care of the patient were reviewed by me and considered in my medical decision making (see chart for details).        Patient with dental pain with associated facial swelling.  No gross abscess amenable to drainage.  She is afebrile, mildly hypertensive but vital signs otherwise stable.  She is tolerating secretions without difficulty.  Exam unconcerning for Ludwig's angina or deep space neck infection.  Will treat with penicillin, NSAIDs, Tylenol, viscous lidocaine.  Urged patient to follow-up with dentist and she was given information for the dentist on call today as well as other outpatient resources.  Discussed strict ED return  precautions. Patient verbalized understanding of and agreement with plan and is safe for discharge home at this time.    Final Clinical Impressions(s) / ED  Diagnoses   Final diagnoses:  Pain, dental    ED Discharge Orders         Ordered    penicillin v potassium (VEETID) 500 MG tablet  4 times daily     01/11/19 0917    lidocaine (XYLOCAINE) 2 % solution  As needed     01/11/19 0917           Renita Papa, PA-C 01/11/19 [id number]    Gareth Morgan, MD 01/13/19 [phone]

## 2019-01-11 NOTE — ED Triage Notes (Signed)
Dental pain to front teeth x 3 days with some swelling.

## 2019-01-11 NOTE — Discharge Instructions (Addendum)
Please take all of your antibiotics until finished!   You may develop abdominal discomfort or diarrhea from the antibiotic.  You may help offset this with probiotics which you can buy or get in yogurt. Do not eat  or take the probiotics until 2 hours after your antibiotic.   Apply warm compresses to jaw throughout the day. Alternate 600 mg of ibuprofen and [phone] mg of Tylenol every 3 hours as needed for pain. Do not exceed 4000 mg of Tylenol daily.  You may also use warm water salt gargles, Orajel, or other over-the-counter dental pain remedies.  Use lidocaine swish and spit (DO NOT SWALLOW) as needed for pain.  Followup with a dentist is very important for ongoing evaluation and management of recurrent dental pain.  I have given you the information for the dentist on call today, should be able to offer dental services at a discounted rate.  Please inquire about this when you call and tell them you were seen in the emergency department for your dental pain and that Dr. Haig Prophet was on call.  I have also attached information for other dentists in the area that can be a more affordable option as well.  Return to emergency department for emergent changing or worsening symptoms such as fever, worsening facial swelling, difficulty breathing or swallowing, throat tightness, or vision changes.

## 2019-01-16 ENCOUNTER — Other Ambulatory Visit: Payer: Self-pay

## 2019-01-16 ENCOUNTER — Ambulatory Visit (INDEPENDENT_AMBULATORY_CARE_PROVIDER_SITE_OTHER): Payer: Medicaid Other | Admitting: Obstetrics and Gynecology

## 2019-01-16 ENCOUNTER — Encounter: Payer: Self-pay | Admitting: Obstetrics and Gynecology

## 2019-01-16 DIAGNOSIS — N8111 Cystocele, midline: Secondary | ICD-10-CM | POA: Diagnosis present

## 2019-01-16 IMAGING — CR DG CHEST 2V
2 series · 2 of 2 positions shown · non-contrast
Comparison: 11/03/2015

CLINICAL DATA: 60 year-old female c/o SOB and cough x 3 weeks. Hx
of COPD, and asthma

EXAM:
CHEST  2 VIEW

[w chest pa]
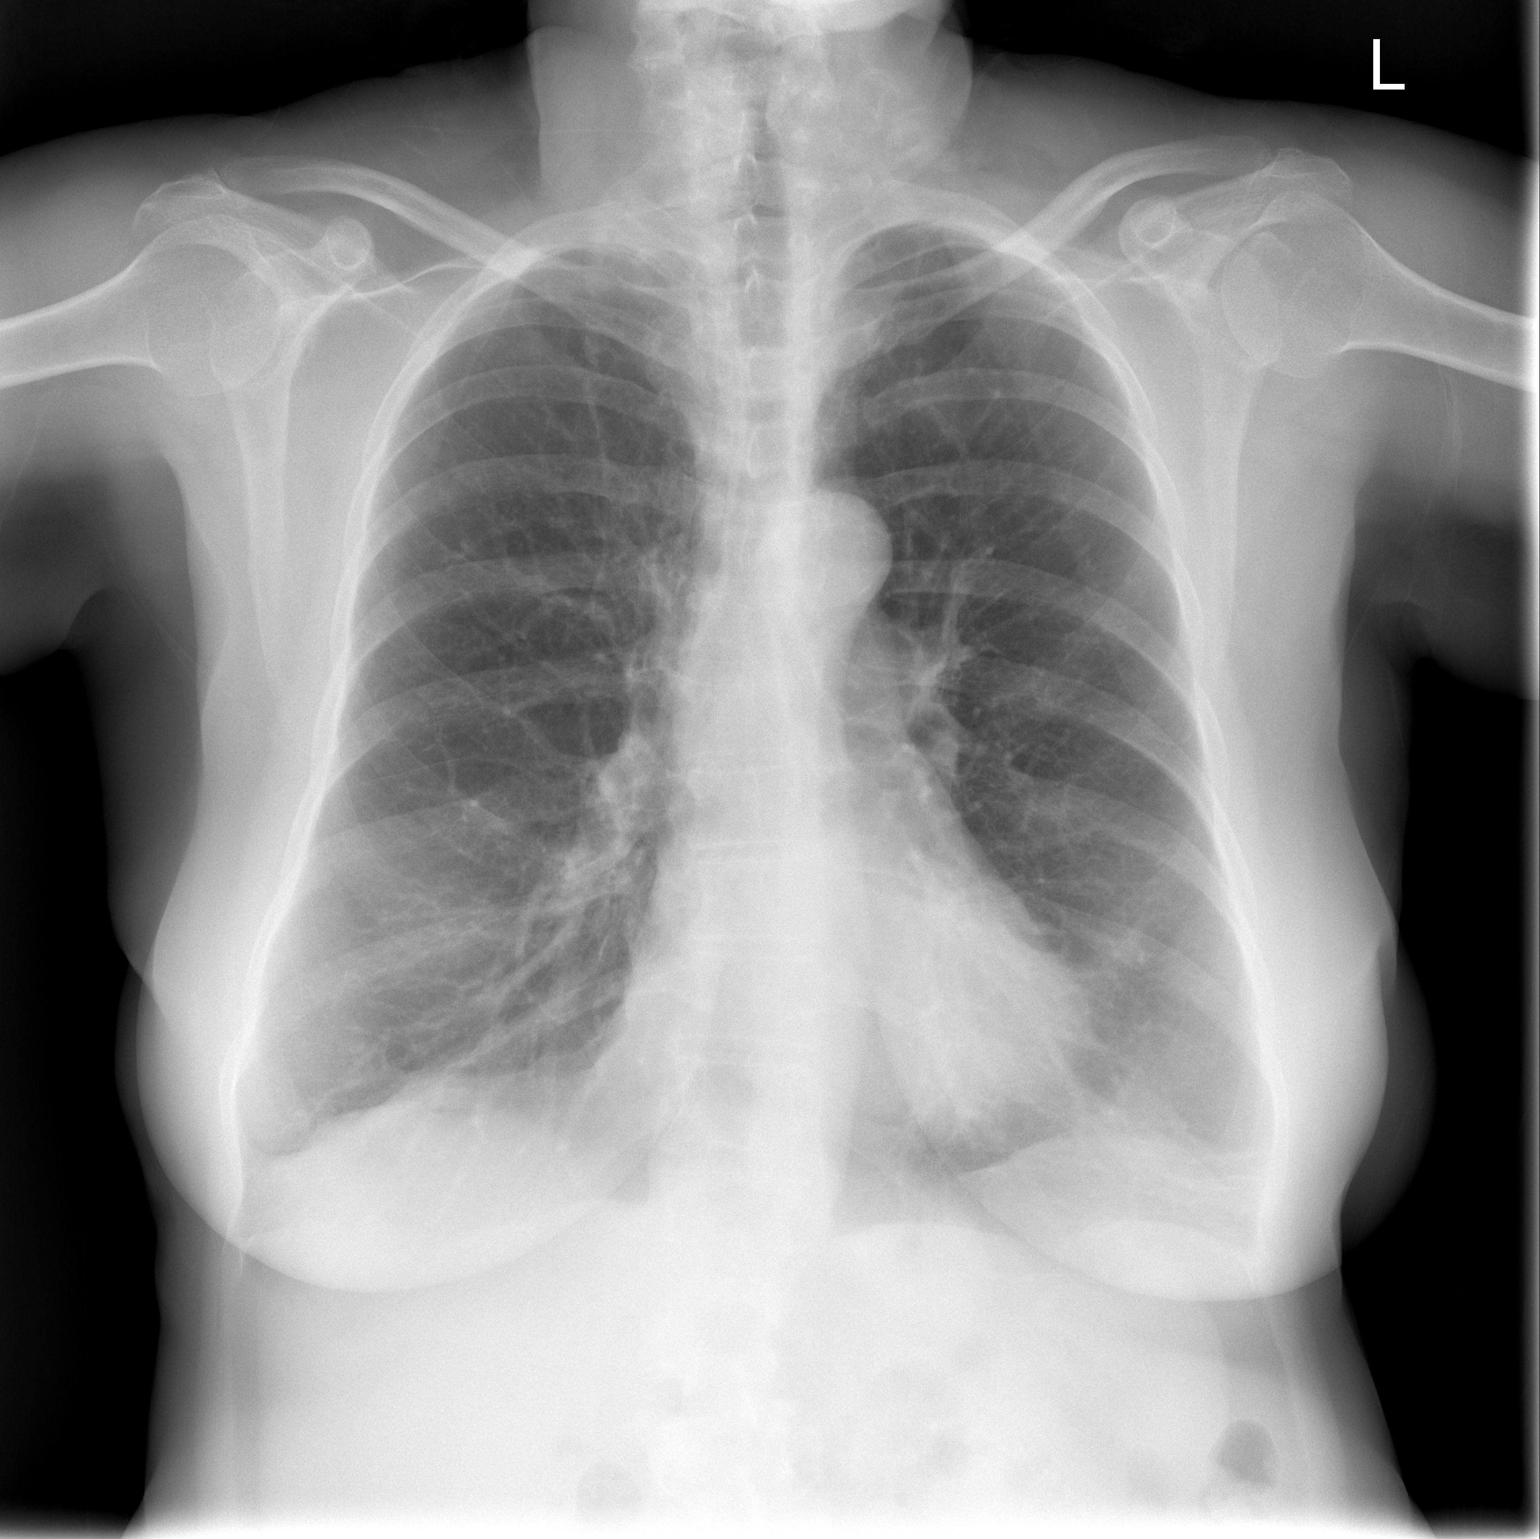

[w chest lat]
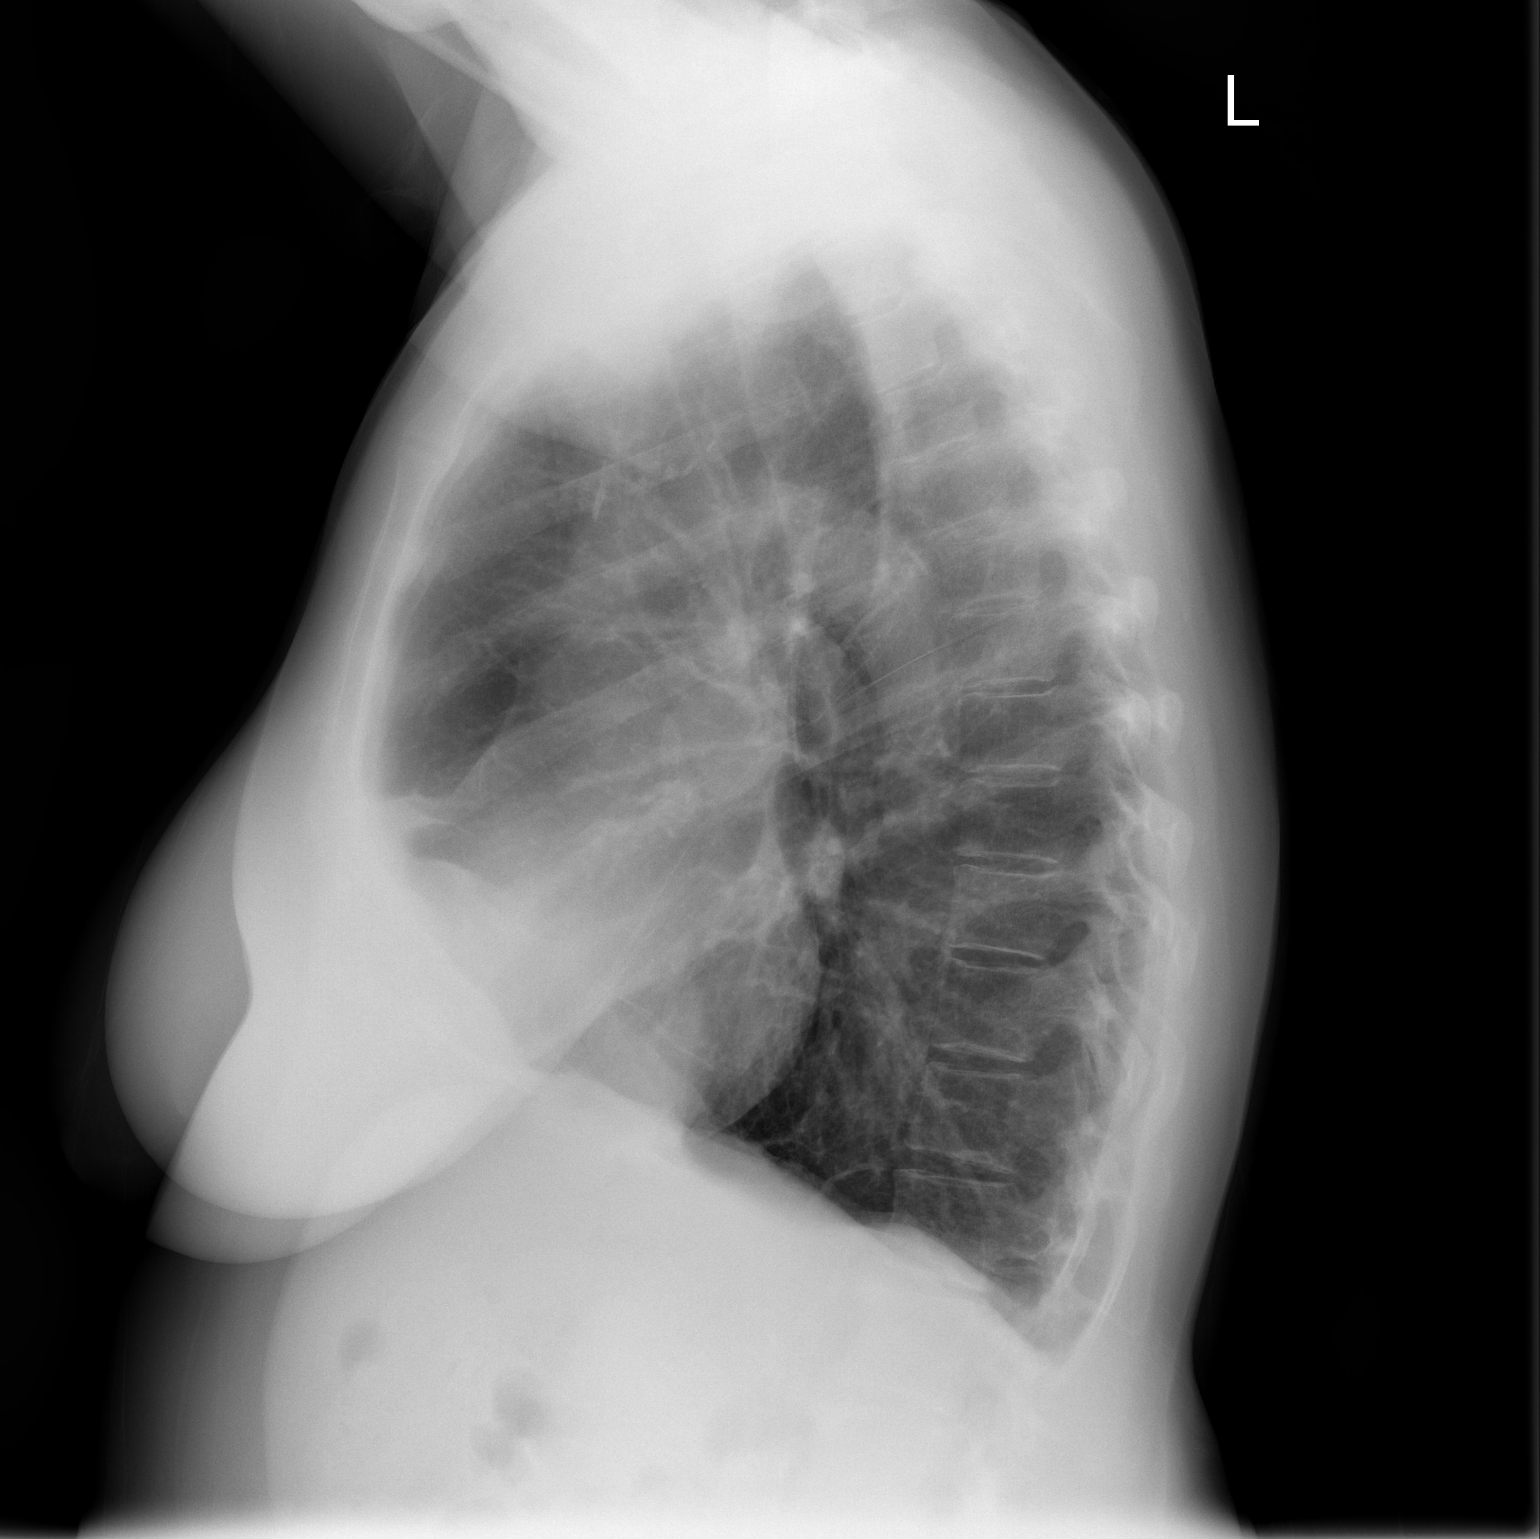

[2 of 2 positions shown; findings below may reference images not displayed]

FINDINGS: Lungs are hyperinflated. Scarring identified at the lung bases. No
focal consolidations or pleural effusions. No pulmonary edema.
IMPRESSION: No active cardiopulmonary disease.

## 2019-01-16 NOTE — Patient Instructions (Signed)

## 2019-01-16 NOTE — Telephone Encounter (Signed)
[facility]. They have adjusted their hours. Called at 1:29 and the message stated the pharmacy is closed until 2:00. Will try again later.

## 2019-01-16 NOTE — Progress Notes (Signed)
Ms Phenis presents in referral for pelvic relaxation x 1 yr. Pt reports noting a vaginal bulge for the last yr. Has progressed over the last several months. Feels vaginal pressure at times. Bulge usually reduces by itself. She has been self cathing since her TAH 20 yrs ago. Unable to get a specific reason. She reports the vaginal bulge makes it harder to self cath. She has also noted some loss of urine when her bladder is full. She otherwise unable to void spontaneously. She also reports problems with constipation Not sexual active.  Medical significant for COPD and as noted in medical records  TSVD x 4  SAB x 1  PE AF VSS  Lungs clear  Heart RRR Abd soft + BS obese GU nl EGBUS atrophic vaginal mucosa cervix/uterus absent,       Q tip > 30 degrees, grade 2 cystocele, perineal body 1 cm, levator muscles attenuated bilaterally  A/P Pelvic relaxation, cystocele        Self cath  Not sure why pt is self cathing for the last 20 yrs. She does have some relaxation. Suspect her COPD is aggravating her relaxation.  Due to both conditions will refer to Urogyn for further eval.

## 2019-01-22 NOTE — Telephone Encounter (Signed)
Called [facility] twice and was not able to reach anyone to get an updated status. Selected provider option and no one ever picked up.  Called pharmacy and confirmed patient picked up prescription on 01/07/19 and paid $3.00 for the prescription. Nothing further needed.

## 2019-02-04 ENCOUNTER — Encounter: Payer: Self-pay | Admitting: Family Medicine

## 2019-02-05 ENCOUNTER — Encounter: Payer: Self-pay | Admitting: Adult Health

## 2019-02-05 ENCOUNTER — Other Ambulatory Visit: Payer: Self-pay

## 2019-02-05 ENCOUNTER — Telehealth: Payer: Self-pay

## 2019-02-05 ENCOUNTER — Ambulatory Visit (INDEPENDENT_AMBULATORY_CARE_PROVIDER_SITE_OTHER): Payer: Medicaid Other | Admitting: Adult Health

## 2019-02-05 DIAGNOSIS — R0600 Dyspnea, unspecified: Secondary | ICD-10-CM | POA: Insufficient documentation

## 2019-02-05 DIAGNOSIS — R0602 Shortness of breath: Secondary | ICD-10-CM

## 2019-02-05 DIAGNOSIS — R400 Somnolence: Secondary | ICD-10-CM | POA: Diagnosis not present

## 2019-02-05 DIAGNOSIS — J449 Chronic obstructive pulmonary disease, unspecified: Secondary | ICD-10-CM

## 2019-02-05 DIAGNOSIS — J9611 Chronic respiratory failure with hypoxia: Secondary | ICD-10-CM

## 2019-02-05 NOTE — Assessment & Plan Note (Signed)
Dyspnea suspect is multifactorial.  Seems to be improved on current COPD regimen.  Suspect she has advanced COPD.  Also 2D echo is pending to evaluate for underlying heart function. Recent labs were reviewed and were essentially unrevealing. Recent chest x-ray in August showed COPD changes.  Plan  Patient Instructions  Continue Dulera 2 puffs Twice daily  , rinse after use.  Continue on Duoneb Four times a day  , may use extra if needed every 4hrs .  Set up for 2 D echo .  Set up for Home Sleep Study .  Activity as tolerated  Continue on Oxygen 2l/m with activity .  Follow up with Dr. Chapman Fitch regarding your high blood pressure.  Follow up with Dr. Elsworth Soho  Or Airen Dales NP in 3 months and As needed   Please contact office for sooner follow up if symptoms do not improve or worsen or seek emergency care

## 2019-02-05 NOTE — Assessment & Plan Note (Signed)
Suspected advanced COPD with emphysema with previous heavy smoking history.  She has improved symptom control on Dulera and DuoNeb.  Also resolution of cough off of ACE inhibitor.  Would avoid ACE inhibitor's going forward if possible. Would definitely recommend pulmonary rehab going forward once PFTs are done .  Have encouraged her to continue with activity as tolerated She is currently being evaluated by disability.  If PFTs are not done soon will order PFTs to establish lung function  Her 2D echo is pending to evaluate underlying heart function.  Plan  Patient Instructions  Continue Dulera 2 puffs Twice daily  , rinse after use.  Continue on Duoneb Four times a day  , may use extra if needed every 4hrs .  Set up for 2 D echo .  Set up for Home Sleep Study .  Activity as tolerated  Continue on Oxygen 2l/m with activity .  Follow up with Dr. Chapman Fitch regarding your high blood pressure.  Follow up with Dr. Elsworth Soho  Or Haile Toppins NP in 3 months and As needed   Please contact office for sooner follow up if symptoms do not improve or worsen or seek emergency care

## 2019-02-05 NOTE — Telephone Encounter (Signed)
Spoke with pt, aware of HST change to Split night test. Split night test has been ordered. Nothing further needed at this time- will close encounter.

## 2019-02-05 NOTE — Patient Instructions (Addendum)
Continue Dulera 2 puffs Twice daily  , rinse after use.  Continue on Duoneb Four times a day  , may use extra if needed every 4hrs .  Set up for 2 D echo .  Set up for Home Sleep Study .  Activity as tolerated  Continue on Oxygen 2l/m with activity .  Follow up with Dr. Chapman Fitch regarding your high blood pressure.  Follow up with Dr. Elsworth Soho  Or [place] NP in 3 months and As needed   Please contact office for sooner follow up if symptoms do not improve or worsen or seek emergency care

## 2019-02-05 NOTE — Telephone Encounter (Signed)
-----   Message from Melvenia Needles, NP sent at 02/05/2019  5:15 PM EST ----- Regarding: RE: HST ordered 02/05/2019 Okay thanks , we will put in order for split night ,  I knew that but forgot she had medicaid.   Tam  ----- Message ----- From: Catha Gosselin Sent: 02/05/2019   3:14 PM EST To: Melvenia Needles, NP Subject: HST ordered 02/05/2019                          Hi [name]:  You ordered a HST on the above patient. Due to this patient having [place] Medicaid, she will have to have an in lab study.  Medicaid will not cover a HST.    Look forward to seeing you soon!  Thanks, Suanne Marker

## 2019-02-05 NOTE — Assessment & Plan Note (Signed)
Patient has exertional hypoxemia.  We will continue on oxygen 2 L with activity.

## 2019-02-05 NOTE — Progress Notes (Signed)
@Patient  ID: Monica Scott, female    DOB: 1956/03/11, 63 y.o.   MRN: [id number]  Chief Complaint  Patient presents with  . Follow-up    copd     Referring provider: [name], MD  HPI: 63 year old female former smoker (heavy smoker history) carries a diagnosis of COPD seen for pulmonary consult January 02, 2019 for dyspnea and COPD.  Medical history significant for oxygen dependent respiratory failure-oxygen started July 2019 Neurogenic bladder with self-catheterization  TEST/EVENTS :  CT chest 04/2018 shows changes of emphysema, mild ectasia of thoracic aorta 3.5 cm  02/05/2019 Follow up : COPD , O2 RF  Patient presents for a 1 month follow-up.  Patient was seen last visit for a pulmonary consult for dyspnea and COPD.  She carries a diagnosis of COPD.  Has no formal PFTs on record.  She has been on oxygen since July 2019 Last visit was having increased shortness of breath and increased oxygen use.  She has had frequent COPD exacerbations requiring prednisone frequently.  Currently applying for disability for her breathing issues. Last visit was started on DuoNeb 4 times daily.  Continued on Dulera.  She was recommended to have a 2D echo.  Unfortunately this has not been done.  She was also changed from lisinopril to Benicar  since last visit patient is feeling better.  Cough has resolved. Feeling improved with decreased dyspnea. Not using oxygen since starting Duoneb.  Uses oxygen only as needed with activity  . Walk test in office shows sats 88% walking on room air.  93% on room air at rest .  Not active. Does light housework , has to rest often. No exercise.   Has snoring and daytime sleepiness. Feels good first thing in morning but tired rest of day.  Naps x 1 during daytime. Weight up 30lbs since quitting smoking .  Epworth score today 7.   No Known Allergies  Immunization History  Administered Date(s) Administered  . Influenza,inj,Quad PF,6+ Mos 12/06/2017, 11/28/2018     Past Medical History:  Diagnosis Date  . Arthritis   . Asthma   . Bladder disease   . COPD (chronic obstructive pulmonary disease) ([place])   . Depression   . GERD (gastroesophageal reflux disease)   . Hyperlipidemia   . Hypertension   . Peptic ulcer     Tobacco History: Social History   Tobacco Use  Smoking Status Former Smoker  . Packs/day: 1.50  . Years: 40.00  . Pack years: 60.00  . Types: Cigarettes  . Quit date: 04/19/2010  . Years since quitting: 8.8  Smokeless Tobacco Never Used   Counseling given: Not Answered   Outpatient Medications Prior to Visit  Medication Sig Dispense Refill  . albuterol (VENTOLIN HFA) 108 (90 Base) MCG/ACT inhaler Inhale 1-2 puffs into the lungs every 6 (six) hours as needed for wheezing or shortness of breath. 6.7 g 3  . amLODipine (NORVASC) 5 MG tablet One pill twice daily to lower blood pressure 180 tablet 1  . Aspirin-Caffeine (BC FAST PAIN RELIEF PO) Take 1 packet by mouth daily as needed (pain).    Marland Kitchen FLUoxetine (PROZAC) 20 MG tablet Take 20 mg by mouth daily.    Marland Kitchen gabapentin (NEURONTIN) 400 MG capsule Take 1 capsule (400 mg total) by mouth 3 (three) times daily. 90 capsule 11  . ibuprofen (ADVIL,MOTRIN) 200 MG tablet Take 400 mg by mouth 3 (three) times daily as needed for moderate pain.    Marland Kitchen ipratropium-albuterol (DUONEB) 0.5-2.5 (3)  MG/3ML SOLN Take 3 mLs by nebulization every 6 (six) hours as needed. 360 mL 1  . loperamide (IMODIUM A-D) 2 MG tablet Take 4 mg by mouth 2 (two) times daily as needed for diarrhea or loose stools.    . mometasone-formoterol (DULERA) 100-5 MCG/ACT AERO Inhale 2 puffs into the lungs 2 (two) times daily. 13 g 3  . naproxen (NAPROSYN) 500 MG tablet Take 1 tablet (500 mg total) by mouth 2 (two) times daily with a meal. As needed for pain 60 tablet 2  . OLANZapine (ZYPREXA) 5 MG tablet Take 10 mg by mouth at bedtime.     Marland Kitchen olmesartan (BENICAR) 40 MG tablet Take 40 mg by mouth daily.    Marland Kitchen omeprazole  (PRILOSEC) 40 MG capsule Take 1 capsule (40 mg total) by mouth 2 (two) times daily. To reduce stomach acid 60 capsule 3  . lidocaine (XYLOCAINE) 2 % solution Use as directed 15 mLs in the mouth or throat as needed for mouth pain. (Patient not taking: Reported on 01/16/2019) 100 mL 0  . olmesartan (BENICAR) 40 MG tablet Take 1 tablet (40 mg total) by mouth daily. (Patient not taking: Reported on 02/05/2019) 30 tablet 1  . predniSONE (DELTASONE) 20 MG tablet 2 pills once per day x 5 days. Take after eating (Patient not taking: Reported on 01/16/2019) 10 tablet 0   No facility-administered medications prior to visit.      Review of Systems:   Constitutional:   No  weight loss, night sweats,  Fevers, chills, fatigue, or  lassitude.  HEENT:   No headaches,  Difficulty swallowing,  Tooth/dental problems, or  Sore throat,                No sneezing, itching, ear ache, nasal congestion, post nasal drip,   CV:  No chest pain,  Orthopnea, PND, swelling in lower extremities, anasarca, dizziness, palpitations, syncope.   GI  No heartburn, indigestion, abdominal pain, nausea, vomiting, diarrhea, change in bowel habits, loss of appetite, bloody stools.   Resp: No shortness of breath with exertion or at rest.  No excess mucus, no productive cough,  No non-productive cough,  No coughing up of blood.  No change in color of mucus.  No wheezing.  No chest wall deformity  Skin: no rash or lesions.  GU: no dysuria, change in color of urine, no urgency or frequency.  No flank pain, no hematuria   MS:  No joint pain or swelling.  No decreased range of motion.  No back pain.    Physical Exam  BP 116/64 (BP Location: Left Arm, Cuff Size: Normal)   Pulse 79   Temp 97.9 F (36.6 C) (Oral)   Ht 4\' 11"  (1.499 m)   Wt 169 lb 3.2 oz (76.7 kg)   SpO2 95%   BMI 34.17 kg/m   GEN: A/Ox3; pleasant , NAD, elderly , obese per BMI    HEENT:  [place]/AT,    NOSE-clear, THROAT-clear, no lesions, no postnasal drip or  exudate noted.   NECK:  Supple w/ fair ROM; no JVD; normal carotid impulses w/o bruits; no thyromegaly or nodules palpated; no lymphadenopathy.    RESP  Clear  P & A; w/o, wheezes/ rales/ or rhonchi. no accessory muscle use, no dullness to percussion  CARD:  RRR, no m/r/g, no peripheral edema, pulses intact, no cyanosis or clubbing.  GI:   Soft & nt; nml bowel sounds; no organomegaly or masses detected.   Musco: Warm bil, no  deformities or joint swelling noted.   Neuro: alert, no focal deficits noted.    Skin: Warm, no lesions or rashes    Lab Results:  CBC    Component Value Date/Time   WBC 8.7 11/20/2018 0824   RBC 4.37 11/20/2018 0824   HGB 12.5 11/20/2018 0824   HGB 12.6 12/06/2017 1035   HCT 39.8 11/20/2018 0824   HCT 38.2 12/06/2017 1035   PLT 364 11/20/2018 0824   PLT 398 12/06/2017 1035   MCV 91.1 11/20/2018 0824   MCV 87 12/06/2017 1035   MCH 28.6 11/20/2018 0824   MCHC 31.4 11/20/2018 0824   RDW 15.9 (H) 11/20/2018 0824   RDW 15.9 (H) 12/06/2017 1035   LYMPHSABS 2.6 11/20/2018 0824   LYMPHSABS 2.4 12/06/2017 1035   MONOABS 0.6 11/20/2018 0824   EOSABS 0.3 11/20/2018 0824   EOSABS 0.2 12/06/2017 1035   BASOSABS 0.1 11/20/2018 0824   BASOSABS 0.1 12/06/2017 1035    BMET    Component Value Date/Time   NA 138 11/20/2018 0824   NA 143 12/06/2017 1035   K 3.7 11/20/2018 0824   CL 101 11/20/2018 0824   CO2 26 11/20/2018 0824   GLUCOSE 108 (H) 11/20/2018 0824   BUN 11 11/20/2018 0824   BUN 7 (L) 12/06/2017 1035   CREATININE 0.78 11/20/2018 0824   CALCIUM 8.8 (L) 11/20/2018 0824   GFRNONAA >60 11/20/2018 0824   GFRAA >60 11/20/2018 0824    BNP    Component Value Date/Time   BNP 10.2 11/20/2018 0824    ProBNP    Component Value Date/Time   PROBNP 174.3 (H) 05/11/2013 2015    Imaging: No results found.    No flowsheet data found.  No results found for: NITRICOXIDE      Assessment & Plan:   No problem-specific Assessment & Plan  notes found for this encounter.     Rexene Edison, NP 02/05/2019

## 2019-02-05 NOTE — Addendum Note (Signed)
Addended by: Len Blalock on: 02/05/2019 09:53 AM   Modules accepted: Orders

## 2019-02-05 NOTE — Assessment & Plan Note (Addendum)
Patient has snoring and daytime sleepiness.  Significant weight gain over the last several years.  Epworth score is 7. At risk for underlying obstructive sleep apnea  Plan  Set up home sleep study

## 2019-02-08 ENCOUNTER — Telehealth: Payer: Self-pay | Admitting: Adult Health

## 2019-02-08 NOTE — Telephone Encounter (Signed)
Will route to PCCs

## 2019-02-09 NOTE — Telephone Encounter (Signed)
Sherry scheduled COVID, echo & sleep study for pt.  Pt was confused, I just clarified everything for her & she asked to keep everything as scheduled.  Nothing further needed at this time.

## 2019-02-09 NOTE — Telephone Encounter (Signed)
[facility], please advise. Thanks!

## 2019-02-09 NOTE — Telephone Encounter (Signed)
Patient states has not received paperwork in the mail.  Has Covid test tomorrow.  Patient phone number is [phone].

## 2019-02-10 ENCOUNTER — Other Ambulatory Visit (HOSPITAL_COMMUNITY)
Admission: RE | Admit: 2019-02-10 | Discharge: 2019-02-10 | Disposition: A | Discharge status: Home / Self Care | Payer: Medicaid Other | Source: Ambulatory Visit | Attending: Pulmonary Disease | Admitting: Pulmonary Disease

## 2019-02-10 DIAGNOSIS — Z20828 Contact with and (suspected) exposure to other viral communicable diseases: Secondary | ICD-10-CM | POA: Diagnosis not present

## 2019-02-10 DIAGNOSIS — Z01812 Encounter for preprocedural laboratory examination: Secondary | ICD-10-CM | POA: Insufficient documentation

## 2019-02-11 LAB — NOVEL CORONAVIRUS, NAA (HOSP ORDER, SEND-OUT TO REF LAB; TAT 18-24 HRS): SARS-CoV-2, NAA: NOT DETECTED

## 2019-02-13 ENCOUNTER — Ambulatory Visit (HOSPITAL_COMMUNITY): Payer: Medicaid Other | Attending: Cardiology

## 2019-02-13 ENCOUNTER — Ambulatory Visit (HOSPITAL_BASED_OUTPATIENT_CLINIC_OR_DEPARTMENT_OTHER): Payer: Medicaid Other | Admitting: Pulmonary Disease

## 2019-02-13 ENCOUNTER — Other Ambulatory Visit: Payer: Self-pay

## 2019-02-13 VITALS — Ht 59.0 in | Wt 165.0 lb

## 2019-02-13 DIAGNOSIS — R400 Somnolence: Secondary | ICD-10-CM

## 2019-02-13 DIAGNOSIS — R0602 Shortness of breath: Secondary | ICD-10-CM | POA: Diagnosis present

## 2019-02-13 DIAGNOSIS — G4733 Obstructive sleep apnea (adult) (pediatric): Secondary | ICD-10-CM

## 2019-02-14 MED FILL — OMEPRAZOLE DR 40 MG CAPSULE: 40 | 30 days supply | Qty: 60 | Fill #3

## 2019-02-15 ENCOUNTER — Other Ambulatory Visit: Payer: Self-pay

## 2019-02-15 ENCOUNTER — Ambulatory Visit (HOSPITAL_COMMUNITY)
Admission: RE | Admit: 2019-02-15 | Discharge: 2019-02-15 | Disposition: A | Discharge status: Home / Self Care | Payer: Medicaid Other | Source: Ambulatory Visit | Attending: Family Medicine | Admitting: Family Medicine

## 2019-02-15 DIAGNOSIS — M545 Low back pain, unspecified: Secondary | ICD-10-CM

## 2019-02-16 ENCOUNTER — Telehealth: Payer: Self-pay | Admitting: *Deleted

## 2019-02-16 ENCOUNTER — Other Ambulatory Visit (HOSPITAL_BASED_OUTPATIENT_CLINIC_OR_DEPARTMENT_OTHER): Payer: Medicaid Other | Admitting: Family Medicine

## 2019-02-16 DIAGNOSIS — G8929 Other chronic pain: Secondary | ICD-10-CM

## 2019-02-16 DIAGNOSIS — M545 Low back pain, unspecified: Secondary | ICD-10-CM

## 2019-02-16 NOTE — Telephone Encounter (Signed)
Patient verified DOB Patient is aware of no new findings being noted and patient needs to follow up as planned.

## 2019-02-16 NOTE — Telephone Encounter (Signed)
Notify patient that referral will be placed

## 2019-02-16 NOTE — Telephone Encounter (Signed)
-----   Message from [name], MD sent at 02/16/2019  5:04 PM EST ----- No acute findings on xray but evidence of scoliosis, arthritis and degenerative disc disease which can all cause back pain

## 2019-02-16 NOTE — Progress Notes (Signed)
Patient ID: Monica Scott, female   DOB: 09-18-1955, 63 y.o.   MRN: [id number]   63 yo female with complaint of worsening of chronic low back pain. Recent lumbar spine film showed no acute abnormalities. She did have chronic scoliosis, arthritis and degenerative disc disease of which she was notified and she would like referral to a specialist due to worsening pain. Ortho referral placed due to worsening of acute on chronic low back pain.

## 2019-02-23 NOTE — Procedures (Signed)
    Patient Name: Monica Scott, Monica Scott Date: 02/13/2019 Gender: Female D.O.B: March 03, 1956 Age (years): 89 Referring Provider: Chesley Mires MD, ABSM Height (inches): 71 Interpreting Physician: Chesley Mires MD, ABSM Weight (lbs): 165 RPSGT: Jacolyn Reedy BMI: 33 MRN: [id number] Neck Size: 17.50  CLINICAL INFORMATION Sleep Study Type: NPSG  Indication for sleep study: COPD, Hypertension, Obesity  Epworth Sleepiness Score: 6  SLEEP STUDY TECHNIQUE As per the AASM Manual for the Scoring of Sleep and Associated Events v2.3 (April 2016) with a hypopnea requiring 4% desaturations.  The channels recorded and monitored were frontal, central and occipital EEG, electrooculogram (EOG), submentalis EMG (chin), nasal and oral airflow, thoracic and abdominal wall motion, anterior tibialis EMG, snore microphone, electrocardiogram, and pulse oximetry.  MEDICATIONS Medications self-administered by patient taken the night of the study : OLANZAPINE  SLEEP ARCHITECTURE The study was initiated at 10:11:33 PM and ended at 4:57:48 AM.  Sleep onset time was 38.8 minutes and the sleep efficiency was 85.8%%. The total sleep time was 348.5 minutes.  Stage REM latency was 131.0 minutes.  The patient spent 6.7%% of the night in stage N1 sleep, 78.3%% in stage N2 sleep, 0.0%% in stage N3 and 14.9% in REM.  Alpha intrusion was absent.  Supine sleep was 1.37%.  RESPIRATORY PARAMETERS The overall apnea/hypopnea index (AHI) was 7.6 per hour. There were 2 total apneas, including 2 obstructive, 0 central and 0 mixed apneas. There were 42 hypopneas and 10 RERAs.  The AHI during Stage REM sleep was 39.2 per hour.  AHI while supine was 0.0 per hour.  The mean oxygen saturation was 88.3%. The minimum SpO2 during sleep was 73.0%.  soft snoring was noted during this study.  CARDIAC DATA The 2 lead EKG demonstrated sinus rhythm. The mean heart rate was 78.8 beats per minute. Other EKG findings  include: None.  LEG MOVEMENT DATA The total PLMS were 0 with a resulting PLMS index of 0.0. Associated arousal with leg movement index was 0.0 .  IMPRESSIONS - Mild obstructive sleep apnea with an AHI of 7.6 and SpO2 low of 73%. - Predominance of events occurred during REM sleep with a REM AHI of 39.2.  DIAGNOSIS - Obstructive Sleep Apnea (327.23 [G47.33 ICD-10])  RECOMMENDATIONS - Additional therapies include weight loss, CPAP, oral appliance, or surgical assessment.  Chesley Mires, MD, [place], American Board of Sleep Medicine NPI: [id number] 02/23/2019, 10:00 AM

## 2019-03-01 ENCOUNTER — Telehealth: Payer: Self-pay | Admitting: Adult Health

## 2019-03-01 NOTE — Telephone Encounter (Signed)
Sleep study - showed Mild obstructive sleep apnea with an AHI of 7.6 and SpO2 low of 73%.  Can set up video visit to discuss results and treatment options.

## 2019-03-02 ENCOUNTER — Telehealth: Payer: Self-pay | Admitting: Adult Health

## 2019-03-02 NOTE — Telephone Encounter (Signed)
Sleep study showed mild obstructive sleep apnea with AHI of 7.6 and SPO2 low at 73% please set up video visit to discuss results and treatment options

## 2019-03-05 ENCOUNTER — Ambulatory Visit: Payer: Medicaid Other | Admitting: Family Medicine

## 2019-03-05 ENCOUNTER — Encounter: Payer: Self-pay | Admitting: Family Medicine

## 2019-03-05 ENCOUNTER — Other Ambulatory Visit: Payer: Self-pay | Admitting: Family Medicine

## 2019-03-05 ENCOUNTER — Other Ambulatory Visit: Payer: Self-pay

## 2019-03-05 DIAGNOSIS — M545 Low back pain: Secondary | ICD-10-CM | POA: Diagnosis not present

## 2019-03-05 DIAGNOSIS — G8929 Other chronic pain: Secondary | ICD-10-CM | POA: Diagnosis not present

## 2019-03-05 MED ORDER — MELOXICAM 15 MG PO TABS: 7.5000 mg | ORAL_TABLET | Freq: Every day | ORAL | 6 refills | Status: DC | PRN

## 2019-03-05 MED ORDER — BACLOFEN 10 MG PO TABS: 5.0000 mg | ORAL_TABLET | Freq: Three times a day (TID) | ORAL | 3 refills | Status: DC | PRN

## 2019-03-05 MED ORDER — NABUMETONE 750 MG PO TABS: 750.0000 mg | ORAL_TABLET | Freq: Two times a day (BID) | ORAL | 6 refills | Status: DC | PRN

## 2019-03-05 NOTE — Progress Notes (Signed)
Office Visit Note   Patient: Monica Scott           Date of Birth: [birth year]           MRN: [id number] Visit Date: 03/05/2019 Requested by: [name], MD [place],  [place] [id number] PCP: [name], MD  Subjective: Chief Complaint  Patient presents with  . Lower Back - Pain    Chronic low back, worse over the past 3 months. NKI.Hurts to stand a long time. Pain goes from 1 side of lower back to the other. No pain down the legs. No numbness/tingling in LEs.     HPI: She is here with low back pain.  Original onset of pain at age 63, she woke up 1 day with severe pain.  She went to her doctor and was given some medications and her acute pain subsided but over the years, she has had intermittent flareups of pain.  In the past 3 months it has gotten worse to the point that she cannot stand very long before the pain becomes intense.  It does not radiate into the legs, no bowel or bladder dysfunction.  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  She is using naproxen with no relief.  Denies fevers, chills.              ROS:   All other systems were reviewed and are negative.  Objective: Vital Signs: There were no vitals taken for this visit.  Physical Exam:  General:  Alert and oriented, in no acute distress. Pulm:  Breathing unlabored. Psy:  Normal mood, congruent affect. Skin: No rash Low back: She has a mild thoracic scoliosis.  No tenderness of the thoracic spine.  She is tender near the L4-5 and L5-S1 levels.  No significant pain in the sciatic notch.  SI joints are slightly tender.  Straight leg raise negative, no pain with internal and external hip rotation.  Lower extremity strength and reflexes are equal and normal.  Imaging: None today.  Recent x-rays show mild scoliosis and spondylosis with calcification of the abdominal aorta.  Hip joints look good.  Assessment & Plan: 1.  Chronic mechanical low back pain -Baclofen,  Relafen as needed.  Physical therapy referral.  If symptoms persist, then MRI scan followed by possible epidural injections.     Procedures: No procedures performed  No notes on file     PMFS History: Patient Active Problem List   Diagnosis Date Noted  . Dyspnea 02/05/2019  . Chronic respiratory failure with hypoxia ([place]) 02/05/2019  . Daytime sleepiness 02/05/2019  . Pelvic relaxation due to cystocele, midline 01/16/2019  . Emphysema of lung ([place]) 06/14/2018  . Aortic atherosclerosis ([place]) 06/14/2018  . Atherosclerosis of coronary artery 06/14/2018  . 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 Esophageal dysphagia 03/01/2018  . Family history of colon cancer 03/01/2018  . Diarrhea 03/01/2018  . Near syncope 10/17/2017  . Hyperlipidemia 10/17/2017  . GERD (gastroesophageal reflux disease) 10/17/2017  . Bladder disease or syndrome 10/17/2017  . Depression 10/17/2017  . Hypertension, essential, benign 02/19/2017  . COPD (chronic obstructive pulmonary disease) ([place]) 02/11/2017  . Chest pain 02/12/2015  . Referral of patient 02/12/2015   Past Medical History:  Diagnosis Date  . Arthritis   . Asthma   . Bladder disease   . COPD (chronic obstructive pulmonary disease) ([place])   . Depression   . GERD (gastroesophageal reflux disease)   .  Hyperlipidemia   . Hypertension   . Peptic ulcer     Family History  Problem Relation Age of Onset  . Alcohol abuse Mother   . Arthritis Mother   . Cancer Mother   . Depression Mother   . Early death Mother   . Alcohol abuse Father   . Depression Father   . Alcohol abuse Sister   . Depression Sister   . Alcohol abuse Brother   . Alcohol abuse Daughter   . Depression Daughter   . Alcohol abuse Son   . COPD Maternal Grandfather   . Heart disease Maternal Grandfather   . Hypertension Maternal Grandfather   . Colon cancer Brother        deceased at age 36    Past Surgical History:  Procedure Laterality Date  . ABDOMINAL  HYSTERECTOMY    . BIOPSY  04/24/2018   Procedure: BIOPSY;  Surgeon: Daneil Dolin, MD;  [facility];  Service: Endoscopy;;  esophagus  . COLONOSCOPY WITH PROPOFOL N/A 04/24/2018   Procedure: COLONOSCOPY WITH PROPOFOL;  Surgeon: Daneil Dolin, MD;  [facility];  Service: Endoscopy;  Laterality: N/A;  9:00am  . ESOPHAGOGASTRODUODENOSCOPY (EGD) WITH PROPOFOL N/A 04/24/2018   Procedure: ESOPHAGOGASTRODUODENOSCOPY (EGD) WITH PROPOFOL;  Surgeon: Daneil Dolin, MD;  [facility];  Service: Endoscopy;  Laterality: N/A;  Venia Minks DILATION N/A 04/24/2018   Procedure: Venia Minks DILATION;  Surgeon: Daneil Dolin, MD;  [facility];  Service: Endoscopy;  Laterality: N/A;  . PILONIDAL CYST EXCISION     age 40  . POLYPECTOMY  04/24/2018   Procedure: POLYPECTOMY;  Surgeon: Daneil Dolin, MD;  [facility];  Service: Endoscopy;;  colon    Social History   Occupational History  . Not on file  Tobacco Use  . Smoking status: Former Smoker    Packs/day: 1.50    Years: 40.00    Pack years: 60.00    Types: Cigarettes    Quit date: 04/19/2010    Years since quitting: 8.8  . Smokeless tobacco: Never Used  Substance and Sexual Activity  . Alcohol use: No    Comment: Former ETOH. none since 2000  . Drug use: No  . Sexual activity: Yes    Birth control/protection: Surgical

## 2019-03-06 ENCOUNTER — Other Ambulatory Visit: Payer: Self-pay | Admitting: Pulmonary Disease

## 2019-03-06 NOTE — Telephone Encounter (Signed)
Televisit scheduled with TP for tomorrow 12.9.20 to discuss sleep study results.  Will sign off.

## 2019-03-07 ENCOUNTER — Ambulatory Visit (INDEPENDENT_AMBULATORY_CARE_PROVIDER_SITE_OTHER): Payer: Medicaid Other | Admitting: Adult Health

## 2019-03-07 ENCOUNTER — Encounter: Payer: Self-pay | Admitting: Adult Health

## 2019-03-07 DIAGNOSIS — J9611 Chronic respiratory failure with hypoxia: Secondary | ICD-10-CM

## 2019-03-07 DIAGNOSIS — G4733 Obstructive sleep apnea (adult) (pediatric): Secondary | ICD-10-CM

## 2019-03-07 DIAGNOSIS — J449 Chronic obstructive pulmonary disease, unspecified: Secondary | ICD-10-CM | POA: Diagnosis not present

## 2019-03-07 NOTE — Patient Instructions (Addendum)
Continue Dulera 2 puffs Twice daily  , rinse after use.  Continue on Duoneb Four times a day  , may use extra if needed every 4hrs .   Wear Oxygen 2l/m At bedtime   Call back if you change your mind about CPAP .  Activity as tolerated  Continue on Oxygen 2l/m with activity . Goal is to have oxygen level >88-90%.  Follow up with Dr. Elsworth Soho in 3 months and As needed  with PFTs  and As needed   Please contact office for sooner follow up if symptoms do not improve or worsen or seek emergency care

## 2019-03-07 NOTE — Addendum Note (Signed)
Addended by: Parke Poisson E on: 03/07/2019 09:57 AM   Modules accepted: Orders

## 2019-03-07 NOTE — Progress Notes (Signed)
Virtual Visit via Telephone Note  I connected with Monica Scott on 03/07/19 at  9:30 AM EST by telephone and verified that I am speaking with the correct person using two identifiers.  Location: Patient: Home Provider: Office   I discussed the limitations, risks, security and privacy concerns of performing an evaluation and management service by telephone and the availability of in person appointments. I also discussed with the patient that there may be a patient responsible charge related to this service. The patient expressed understanding and agreed to proceed.   History of Present Illness: 63 year old female former smoker (heavy smoker history) carries a diagnosis of COPD seen for pulmonary consult January 02, 2019 for dyspnea and COPD. Medical history significant for oxygen dependent respiratory failure started July 2019 Neurogenic bladder with self-catheterization  Today's televisit is a follow-up of recent sleep study that showed she had mild sleep apnea.  Patient was seen 1 month ago for a follow-up for COPD.  She is prone to frequent COPD exacerbations.  She was changed from lisinopril to Benicar.  Said that her cough has improved she was also started on DuoNeb nebulizer which she feels has helped her breathing. Remains on [facility]  Patient remains on oxygen with activity as needed. Last visit patient had complained of some snoring and daytime sleepiness.  She was set up for a sleep study.  N PSG was done on February 13, 2019 that showed mild obstructive sleep apnea with AHI at 7.6 and SPO2 low at 73%.  We discussed her test results with patient education on sleep apnea.  We went over treatment options including weight loss, oral appliance and CPAP.  Patient says she will not be able to wear CPAP . Does not want to try it at this time. Has significant dental issues , does not feel she would be able to use oral appliance. We discussed healthy weight loss.  She has oxygen at home, advised  that she can use this at night , will not correct apneic events but may help some with nocturnal hypoxia .     Observations/Objective: CT chest 04/2018 shows changes of emphysema,mild ectasia of thoracic aorta 3.5 cm  2D echo February 13, 2019 showed a normal EF at 60 to 65%.   Assessment and Plan: Mild obstructive sleep apnea-discussed sleep study results .  We went over treatment options including weight loss, oral appliance and CPAP.  Patient declines oral appliance and CPAP at this time. Encouraged on healthy weight loss.  Patient may use her home oxygen nocturnally at 2 L this may help with some nocturnal hypoxemia but advised her it would not correct her apneic events.  Encouraged her to call back if she changes her mind on CPAP  COPD.  Patient has improved symptom control on DuoNeb and Dulera.  Will check PFTs on return.  Chronic hypoxic respiratory failure.  Patient has exertional hypoxemia.  Advised to use oxygen 2 L with activity goal was to keep O2 saturations greater than 88 to 90%.  Plan  Patient Instructions  Continue Dulera 2 puffs Twice daily  , rinse after use.  Continue on Duoneb Four times a day  , may use extra if needed every 4hrs .   Wear Oxygen 2l/m At bedtime   Call back if you change your mind about CPAP .  Activity as tolerated  Continue on Oxygen 2l/m with activity . Goal is to have oxygen level >88-90%.  Follow up with Dr. Elsworth Soho in 3 months and  As needed  with PFTs  and As needed   Please contact office for sooner follow up if symptoms do not improve or worsen or seek emergency care         Follow Up Instructions:   Follow-up in 3 months and as needed with PFTs  I discussed the assessment and treatment plan with the patient. The patient was provided an opportunity to ask questions and all were answered. The patient agreed with the plan and demonstrated an understanding of the instructions.   The patient was advised to call back or seek an in-person  evaluation if the symptoms worsen or if the condition fails to improve as anticipated.  I provided 23 minutes of non-face-to-face time during this encounter.   Rexene Edison, NP

## 2019-03-09 ENCOUNTER — Telehealth: Payer: Self-pay

## 2019-03-09 NOTE — Telephone Encounter (Signed)
We received a fax requesting prior authorization for nabumetone 750 mg. Dr. Junius Roads had already changed this to meloxicam, as it is covered by [facility] without prior authorization needed. Advised them to disregard the nabumetone and fill the meloxicam (if they have not already done so).

## 2019-03-29 ENCOUNTER — Telehealth: Payer: Self-pay

## 2019-03-29 NOTE — Telephone Encounter (Signed)
At request of Dr Chapman Fitch, call placed to [facility] to check on status of recommendations from sleep study. The office was closed for the holidays.

## 2019-04-02 ENCOUNTER — Other Ambulatory Visit: Payer: Self-pay

## 2019-04-02 ENCOUNTER — Encounter: Payer: Self-pay | Admitting: Adult Health

## 2019-04-02 ENCOUNTER — Ambulatory Visit: Payer: Medicaid Other | Admitting: Adult Health

## 2019-04-02 VITALS — BP 134/72 | HR 89 | Temp 98.2°F | Ht 59.0 in | Wt 184.4 lb

## 2019-04-02 DIAGNOSIS — J9611 Chronic respiratory failure with hypoxia: Secondary | ICD-10-CM | POA: Diagnosis not present

## 2019-04-02 DIAGNOSIS — J449 Chronic obstructive pulmonary disease, unspecified: Secondary | ICD-10-CM

## 2019-04-02 NOTE — Patient Instructions (Addendum)
Continue Dulera 2 puffs Twice daily  , rinse after use.  Continue on Duoneb Four times a day  , may use extra if needed every 4hrs .  Wear Oxygen 2l/m rest and 3l/m with activity .  Order for a POC  Alpha 1 lab test  Call back if you change your mind about CPAP .  Activity as tolerated  Follow up with Dr. Elsworth Soho in 2-3  months and As needed  with PFTs  and As needed   Please contact office for sooner follow up if symptoms do not improve or worsen or seek emergency care

## 2019-04-02 NOTE — Assessment & Plan Note (Addendum)
Needs formal PFTs. Check alpha-1 test.  Plan  Patient Instructions  Continue Dulera 2 puffs Twice daily  , rinse after use.  Continue on Duoneb Four times a day  , may use extra if needed every 4hrs .  Wear Oxygen 2l/m rest and 3l/m with activity .  Order for a POC  Alpha 1 lab test  Call back if you change your mind about CPAP .  Activity as tolerated  Follow up with Dr. Elsworth Soho in 2-3  months and As needed  with PFTs  and As needed   Please contact office for sooner follow up if symptoms do not improve or worsen or seek emergency care

## 2019-04-02 NOTE — Progress Notes (Signed)
@Patient  ID: Monica Scott, female    DOB: [birth year], 64 y.o.   MRN: [id number]  Chief Complaint  Patient presents with  . Follow-up    COPD     Referring provider: [name], MD  HPI: 64 year old female former smoker (heavy smoking history) carries a diagnosis of COPD seen for pulmonary consult January 02, 2019 for dyspnea and COPD.  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   N PSG was done on February 13, 2019 that showed mild obstructive sleep apnea with AHI at 7.6 and SPO2 low at 73%.   04/02/2019 Follow up : COPD , O2 RF  Patient returns for a 1 month follow-up.  Patient has underlying COPD.  She has not had formal pulmonary function testing.  She has a strong smoking history.  Also has a family history of COPD.  She remains on Dulera twice daily and DuoNeb 3-4 times a day.  Patient has been on oxygen at bedtime since July 2019.  Says that she does notice that she gets more short of breath with activity.  Has been on oxygen with activity since 2019.  However only has oxygen tanks which are too heavy for her to carry.  She would like a portable system.  Walk test in the office shows O2 saturations 78% on room air walking.  She required 3 L of oxygen to maintain oxygen level greater than 91%.  O2 saturation at rest 88%.  On room air.  Required 2 L of oxygen to maintain oxygen level greater than 90%. Patient denies any increased cough or congestion.  Patient says she does have some underlying depression.  She is on some new medication and feels that this is because some weight gain.  She says she is not very active and has a sedentary lifestyle.   She denies any orthopnea or increased leg  swelling.  She denies any chest pain.   No Known Allergies  Immunization History  Administered Date(s) Administered  . Influenza,inj,Quad PF,6+ Mos 12/06/2017, 11/28/2018    Past Medical History:  Diagnosis Date  . Arthritis   . Asthma   . Bladder disease   . COPD (chronic obstructive pulmonary disease) ([place])   . Depression   . GERD (gastroesophageal reflux disease)   . Hyperlipidemia   . Hypertension   . Peptic ulcer     Tobacco History: Social History   Tobacco Use  Smoking Status Former Smoker  . Packs/day: 1.50  . Years: 40.00  . Pack years: 60.00  . Types: Cigarettes  . Quit date: 04/19/2010  . Years since quitting: 8.9  Smokeless Tobacco Never Used   Counseling given: Not Answered   Outpatient Medications Prior to Visit  Medication Sig Dispense Refill  . albuterol (VENTOLIN HFA) 108 (90 Base) MCG/ACT inhaler Inhale 1-2 puffs into the lungs every 6 (six) hours as needed for wheezing or shortness of breath. 6.7 g 3  . amLODipine (NORVASC) 5 MG tablet One pill twice daily to lower blood pressure 180 tablet 1  . baclofen (LIORESAL) 10 MG tablet Take 0.5-1 tablets (5-10 mg total) by mouth 3 (three) times daily as needed for muscle spasms. 30 each 3  . FLUoxetine (PROZAC) 20 MG  tablet Take 20 mg by mouth daily.    Marland Kitchen gabapentin (NEURONTIN) 400 MG capsule Take 1 capsule (400 mg total) by mouth 3 (three) times daily. 90 capsule 11  . ibuprofen (ADVIL,MOTRIN) 200 MG tablet Take 400 mg by mouth 3 (three) times daily as needed for moderate pain.    Marland Kitchen ipratropium-albuterol (DUONEB) 0.5-2.5 (3) MG/3ML SOLN Take 3 mLs by nebulization every 6 (six) hours as needed. 360 mL 1  . loperamide (IMODIUM A-D) 2 MG tablet Take 4 mg by mouth 2 (two) times daily as needed for diarrhea or loose stools.    . meloxicam (MOBIC) 15 MG tablet Take 0.5-1 tablets (7.5-15 mg total) by mouth daily as needed for pain. 30 tablet 6  . mometasone-formoterol (DULERA) 100-5 MCG/ACT AERO Inhale 2 puffs  into the lungs 2 (two) times daily. 13 g 3  . nabumetone (RELAFEN) 750 MG tablet Take 1 tablet (750 mg total) by mouth 2 (two) times daily as needed. 60 tablet 6  . naproxen (NAPROSYN) 500 MG tablet Take 1 tablet (500 mg total) by mouth 2 (two) times daily with a meal. As needed for pain 60 tablet 2  . OLANZapine (ZYPREXA) 5 MG tablet Take 10 mg by mouth at bedtime.     Marland Kitchen olmesartan (BENICAR) 40 MG tablet Take 1 tablet by mouth once daily 30 tablet 3  . omeprazole (PRILOSEC) 40 MG capsule Take 1 capsule (40 mg total) by mouth 2 (two) times daily. To reduce stomach acid 60 capsule 3   No facility-administered medications prior to visit.     Review of Systems:   Constitutional:   No  weight loss, night sweats,  Fevers, chills, fatigue, or  lassitude.  HEENT:   No headaches,  Difficulty swallowing,  Tooth/dental problems, or  Sore throat,                No sneezing, itching, ear ache, nasal congestion, post nasal drip,   CV:  No chest pain,  Orthopnea, PND, swelling in lower extremities, anasarca, dizziness, palpitations, syncope.   GI  No heartburn, indigestion, abdominal pain, nausea, vomiting, diarrhea, change in bowel habits, loss of appetite, bloody stools.   Resp:    No chest wall deformity  Skin: no rash or lesions.  GU: no dysuria, change in color of urine, no urgency or frequency.  No flank pain, no hematuria   MS:  No joint pain or swelling.  No decreased range of motion.  No back pain.    Physical Exam  BP 134/72 (BP Location: Left Arm, Cuff Size: Normal)   Pulse 89   Temp 98.2 F (36.8 C) (Temporal)   Ht 4\' 11"  (1.499 m)   Wt 184 lb 6.4 oz (83.6 kg)   SpO2 95% Comment: RA  BMI 37.24 kg/m   GEN: A/Ox3; pleasant , NAD, obese   HEENT:  [place]/AT,   NOSE-clear, THROAT-clear, no lesions, no postnasal drip or exudate noted.   NECK:  Supple w/ fair ROM; no JVD; normal carotid impulses w/o bruits; no thyromegaly or nodules palpated; no lymphadenopathy.    RESP  Clear  P  & A; w/o, wheezes/ rales/ or rhonchi. no accessory muscle use, no dullness to percussion  CARD:  RRR, no m/r/g, no peripheral edema, pulses intact, no cyanosis or clubbing.  GI:   Soft & nt; nml bowel sounds; no organomegaly or masses detected.   Musco: Warm bil, no deformities or joint swelling noted.   Neuro: alert, no focal deficits noted.  Skin: Warm, no lesions or rashes    Lab Results:  CBC    Component Value Date/Time   WBC 8.7 11/20/2018 0824   RBC 4.37 11/20/2018 0824   HGB 12.5 11/20/2018 0824   HGB 12.6 12/06/2017 1035   HCT 39.8 11/20/2018 0824   HCT 38.2 12/06/2017 1035   PLT 364 11/20/2018 0824   PLT 398 12/06/2017 1035   MCV 91.1 11/20/2018 0824   MCV 87 12/06/2017 1035   MCH 28.6 11/20/2018 0824   MCHC 31.4 11/20/2018 0824   RDW 15.9 (H) 11/20/2018 0824   RDW 15.9 (H) 12/06/2017 1035   LYMPHSABS 2.6 11/20/2018 0824   LYMPHSABS 2.4 12/06/2017 1035   MONOABS 0.6 11/20/2018 0824   EOSABS 0.3 11/20/2018 0824   EOSABS 0.2 12/06/2017 1035   BASOSABS 0.1 11/20/2018 0824   BASOSABS 0.1 12/06/2017 1035    BMET    Component Value Date/Time   NA 138 11/20/2018 0824   NA 143 12/06/2017 1035   K 3.7 11/20/2018 0824   CL 101 11/20/2018 0824   CO2 26 11/20/2018 0824   GLUCOSE 108 (H) 11/20/2018 0824   BUN 11 11/20/2018 0824   BUN 7 (L) 12/06/2017 1035   CREATININE 0.78 11/20/2018 0824   CALCIUM 8.8 (L) 11/20/2018 0824   GFRNONAA >60 11/20/2018 0824   GFRAA >60 11/20/2018 0824    BNP    Component Value Date/Time   BNP 10.2 11/20/2018 0824    ProBNP    Component Value Date/Time   PROBNP 174.3 (H) 05/11/2013 2015    Imaging: No results found.    No flowsheet data found.  No results found for: NITRICOXIDE      Assessment & Plan:   COPD (chronic obstructive pulmonary disease) ([place]) Needs formal PFTs. Check alpha-1 test.  Plan  Patient Instructions  Continue Dulera 2 puffs Twice daily  , rinse after use.  Continue on Duoneb  Four times a day  , may use extra if needed every 4hrs .  Wear Oxygen 2l/m rest and 3l/m with activity .  Order for a POC  Alpha 1 lab test  Call back if you change your mind about CPAP .  Activity as tolerated  Follow up with Dr. Elsworth Soho in 2-3  months and As needed  with PFTs  and As needed   Please contact office for sooner follow up if symptoms do not improve or worsen or seek emergency care         Chronic respiratory failure with hypoxia ([place]) Chronic oxygen dependent respiratory failure dependent since 2018.  This does seem to be progressive. -will check PFT.  Recent cXR w/ no acute changes in 10/2018.  2 D echo 01/2019 w/ preserved EF .   Plan  Patient Instructions  Continue Dulera 2 puffs Twice daily  , rinse after use.  Continue on Duoneb Four times a day  , may use extra if needed every 4hrs .  Wear Oxygen 2l/m rest and 3l/m with activity .  Order for a POC  Alpha 1 lab test  Call back if you change your mind about CPAP .  Activity as tolerated  Follow up with Dr. Elsworth Soho in 2-3  months and As needed  with PFTs  and As needed   Please contact office for sooner follow up if symptoms do not improve or worsen or seek emergency care          Total Patient CARE time 35 minutes   Neva Ramaswamy, NP 04/02/2019

## 2019-04-02 NOTE — Assessment & Plan Note (Addendum)
Chronic oxygen dependent respiratory failure dependent since 2018.  This does seem to be progressive. -will check PFT.  Recent cXR w/ no acute changes in 10/2018.  2 D echo 01/2019 w/ preserved EF .   Plan  Patient Instructions  Continue Dulera 2 puffs Twice daily  , rinse after use.  Continue on Duoneb Four times a day  , may use extra if needed every 4hrs .  Wear Oxygen 2l/m rest and 3l/m with activity .  Order for a POC  Alpha 1 lab test  Call back if you change your mind about CPAP .  Activity as tolerated  Follow up with Dr. Elsworth Soho in 2-3  months and As needed  with PFTs  and As needed   Please contact office for sooner follow up if symptoms do not improve or worsen or seek emergency care

## 2019-04-07 LAB — ALPHA-1 ANTITRYPSIN PHENOTYPE: A-1 Antitrypsin, Ser: 139 mg/dL (ref 83–199)

## 2019-04-10 NOTE — Progress Notes (Signed)
Called spoke with patient, advised of lab results / recs as stated by Rexene Edison NP.  Pt verbalized understanding and denied any questions.

## 2019-04-12 ENCOUNTER — Telehealth: Payer: Self-pay

## 2019-04-12 NOTE — Telephone Encounter (Signed)
CMN for homefill O2 faxed to [facility]

## 2019-04-20 ENCOUNTER — Other Ambulatory Visit: Payer: Self-pay | Admitting: Pulmonary Disease

## 2019-04-20 DIAGNOSIS — J449 Chronic obstructive pulmonary disease, unspecified: Secondary | ICD-10-CM

## 2019-04-30 ENCOUNTER — Other Ambulatory Visit: Payer: Self-pay | Admitting: Family Medicine

## 2019-05-01 ENCOUNTER — Other Ambulatory Visit: Payer: Self-pay | Admitting: Family Medicine

## 2019-05-01 DIAGNOSIS — K219 Gastro-esophageal reflux disease without esophagitis: Secondary | ICD-10-CM

## 2019-05-01 DIAGNOSIS — R1013 Epigastric pain: Secondary | ICD-10-CM

## 2019-05-02 MED FILL — OMEPRAZOLE DR 40 MG CAPSULE: 40 | 30 days supply | Qty: 60 | Fill #0

## 2019-05-09 ENCOUNTER — Ambulatory Visit: Payer: Medicaid Other | Admitting: Adult Health

## 2019-05-28 ENCOUNTER — Other Ambulatory Visit: Payer: Self-pay | Admitting: Family Medicine

## 2019-06-21 ENCOUNTER — Other Ambulatory Visit: Payer: Self-pay | Admitting: Pulmonary Disease

## 2019-06-21 DIAGNOSIS — J449 Chronic obstructive pulmonary disease, unspecified: Secondary | ICD-10-CM

## 2019-07-06 ENCOUNTER — Other Ambulatory Visit: Payer: Self-pay | Admitting: Pulmonary Disease

## 2019-07-06 ENCOUNTER — Other Ambulatory Visit: Payer: Self-pay | Admitting: Family Medicine

## 2019-07-11 ENCOUNTER — Encounter: Payer: Self-pay | Admitting: Adult Health

## 2019-07-11 ENCOUNTER — Other Ambulatory Visit: Payer: Self-pay

## 2019-07-11 ENCOUNTER — Ambulatory Visit (INDEPENDENT_AMBULATORY_CARE_PROVIDER_SITE_OTHER): Payer: Medicaid Other | Admitting: Adult Health

## 2019-07-11 DIAGNOSIS — J9611 Chronic respiratory failure with hypoxia: Secondary | ICD-10-CM

## 2019-07-11 DIAGNOSIS — J449 Chronic obstructive pulmonary disease, unspecified: Secondary | ICD-10-CM

## 2019-07-11 MED ORDER — ALBUTEROL SULFATE HFA 108 (90 BASE) MCG/ACT IN AERS: 1.0000 | INHALATION_SPRAY | Freq: Four times a day (QID) | RESPIRATORY_TRACT | 5 refills | Status: DC | PRN

## 2019-07-11 MED ORDER — MOMETASONE FURO-FORMOTEROL FUM 100-5 MCG/ACT IN AERO: 2.0000 | INHALATION_SPRAY | Freq: Two times a day (BID) | RESPIRATORY_TRACT | 5 refills | Status: DC

## 2019-07-11 NOTE — Progress Notes (Signed)
@Patient  ID: Monica Scott, female    DOB: 1955/11/19, 64 y.o.   MRN: [id number]  Chief Complaint  Patient presents with  . 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  N PSG was done on February 13, 2019 that showed mild obstructive sleep apnea with AHI at 7.6 and SPO2 low at 73%.    Alpha-1 antitrypsin-MM, 139  07/11/2019 Follow up : COPD , O2 RF  Patient presents for a 21-month follow-up.  Patient is underlying COPD.  She has been set up for pulmonary function test which is pending.  She has a strong smoking history.  And a family history of COPD.  Alpha-1 testing was normal. Patient says overall breathing is doing the same.  She gets short of breath with heavy activity.  She denies any increased cough or wheezing.  She has received both of her Covid vaccines.  Patient remains on oxygen 2 L at rest and 3 L with activity.  Patient was ordered a POC oxygen device but has not received this yet.  She remains on Dulera twice daily and DuoNeb nebulizer 3 times daily.     No Known Allergies  Immunization History  Administered Date(s) Administered  . Influenza,inj,Quad PF,6+ Mos 12/06/2017, 11/28/2018  . PFIZER SARS-COV-2 Vaccination 06/24/2019, 07/08/2019    Past Medical History:  Diagnosis Date  . Arthritis   . Asthma   . Bladder disease   . COPD (chronic obstructive pulmonary disease) ([place])   . Depression   . GERD (gastroesophageal reflux disease)   .  Hyperlipidemia   . Hypertension   . Peptic ulcer     Tobacco History: Social History   Tobacco Use  Smoking Status Former Smoker  . Packs/day: 1.50  . Years: 40.00  . Pack years: 60.00  . Types: Cigarettes  . Quit date: 04/19/2010  . Years since quitting: 9.2  Smokeless Tobacco Never Used   Counseling given: Not Answered   Outpatient Medications Prior to Visit  Medication Sig Dispense Refill  . amLODipine (NORVASC) 5 MG tablet One pill twice daily to lower blood pressure 180 tablet 1  . baclofen (LIORESAL) 10 MG tablet TAKE 1/2 TO 1 (ONE-HALF TO ONE) TABLET BY MOUTH THREE TIMES DAILY AS NEEDED FOR MUSCLE SPASM 30 tablet 0  . FLUoxetine (PROZAC) 20 MG tablet Take 20 mg by mouth daily.    Marland Kitchen gabapentin (NEURONTIN) 400 MG capsule Take 1 capsule (400 mg total) by mouth 3 (three) times daily. 90 capsule 11  . ibuprofen (ADVIL,MOTRIN) 200 MG tablet Take 400 mg by mouth 3 (three) times daily as needed for moderate pain.    Marland Kitchen ipratropium-albuterol (DUONEB) 0.5-2.5 (3) MG/3ML SOLN USE 1 AMPULE IN NEBULIZER EVERY 6 HOURS AS NEEDED 360 mL 0  . loperamide (IMODIUM A-D) 2 MG tablet Take 4 mg by mouth 2 (two) times daily as needed for diarrhea or loose stools.    . meloxicam (MOBIC) 15 MG tablet Take 0.5-1 tablets (7.5-15 mg total) by mouth daily as  needed for pain. 30 tablet 6  . nabumetone (RELAFEN) 750 MG tablet Take 1 tablet (750 mg total) by mouth 2 (two) times daily as needed. 60 tablet 6  . naproxen (NAPROSYN) 500 MG tablet Take 1 tablet (500 mg total) by mouth 2 (two) times daily with a meal. As needed for pain 60 tablet 2  . OLANZapine (ZYPREXA) 5 MG tablet Take 10 mg by mouth at bedtime.     Marland Kitchen olmesartan (BENICAR) 40 MG tablet Take 1 tablet by mouth once daily 30 tablet 3  . omeprazole (PRILOSEC) 40 MG capsule TAKE 1 CAPSULE (40 MG TOTAL) BY MOUTH 2 (TWO) TIMES DAILY. TO REDUCE STOMACH ACID 60 capsule 0  . albuterol (VENTOLIN HFA) 108 (90 Base) MCG/ACT inhaler Inhale 1-2 puffs into the  lungs every 6 (six) hours as needed for wheezing or shortness of breath. 6.7 g 3  . mometasone-formoterol (DULERA) 100-5 MCG/ACT AERO Inhale 2 puffs into the lungs 2 (two) times daily. 13 g 3   No facility-administered medications prior to visit.     Review of Systems:   Constitutional:   No  weight loss, night sweats,  Fevers, chills,  +fatigue, or  lassitude.  HEENT:   No headaches,  Difficulty swallowing,  Tooth/dental problems, or  Sore throat,                No sneezing, itching, ear ache, nasal congestion, post nasal drip,   CV:  No chest pain,  Orthopnea, PND, swelling in lower extremities, anasarca, dizziness, palpitations, syncope.   GI  No heartburn, indigestion, abdominal pain, nausea, vomiting, diarrhea, change in bowel habits, loss of appetite, bloody stools.   Resp:  .  No chest wall deformity  Skin: no rash or lesions.  GU: no dysuria, change in color of urine, no urgency or frequency.  No flank pain, no hematuria   MS:  No joint pain or swelling.  No decreased range of motion.  No back pain.    Physical Exam  BP 128/82 (BP Location: Left Arm, Cuff Size: Normal)   Pulse 83   Ht 4\' 11"  (1.499 m)   Wt 165 lb 9.6 oz (75.1 kg)   SpO2 92%   BMI 33.45 kg/m   GEN: A/Ox3; pleasant , NAD, well nourished , on O2    HEENT:  [place]/AT,   NOSE-clear, THROAT-clear, no lesions, no postnasal drip or exudate noted.   NECK:  Supple w/ fair ROM; no JVD; normal carotid impulses w/o bruits; no thyromegaly or nodules palpated; no lymphadenopathy.    RESP  Clear  P & A; w/o, wheezes/ rales/ or rhonchi. no accessory muscle use, no dullness to percussion  CARD:  RRR, no m/r/g, no peripheral edema, pulses intact, no cyanosis or clubbing.  GI:   Soft & nt; nml bowel sounds; no organomegaly or masses detected.   Musco: Warm bil, no deformities or joint swelling noted.   Neuro: alert, no focal deficits noted.    Skin: Warm, no lesions or rashes    Lab  Results:  CBC   BMET   Imaging: No results found.    No flowsheet data found.  No results found for: NITRICOXIDE      Assessment & Plan:   COPD (chronic obstructive pulmonary disease) ([facility]) Currently stable without exacerbation Activity as tolerated Needs PFTs on return  Plan  Patient Instructions  Continue Dulera 2 puffs Twice daily  , rinse after use.  Continue on Duoneb Three times a day , may  use extra if needed every 4hrs .  Wear Oxygen 2l/m rest and 3l/m with activity .  Activity as tolerated  Follow up with Dr. Elsworth Soho in -3  months and As needed  with PFTs  and As needed   Please contact office for sooner follow up if symptoms do not improve or worsen or seek emergency care         Chronic respiratory failure with hypoxia ([place]) Continue on oxygen 2 L with rest and with 3 L at activity.  O2 saturation goals greater than 88 to 90%     Rexene Edison, NP 07/11/2019

## 2019-07-11 NOTE — Progress Notes (Signed)
Pt left office before her AVS could be given to her. I have mailed this to her home address. I have also left a message for her to call back to schedule the appointments that [name] wanted her to have.

## 2019-07-11 NOTE — Telephone Encounter (Signed)
Okay to refill x1 Further refills from PCP

## 2019-07-11 NOTE — Telephone Encounter (Signed)
Dr. Elsworth Soho, please advise if you are okay with refilling med or if pt needs to defer to PCP.

## 2019-07-11 NOTE — Assessment & Plan Note (Signed)
Currently stable without exacerbation Activity as tolerated Needs PFTs on return  Plan  Patient Instructions  Continue Dulera 2 puffs Twice daily  , rinse after use.  Continue on Duoneb Three times a day , may use extra if needed every 4hrs .  Wear Oxygen 2l/m rest and 3l/m with activity .  Activity as tolerated  Follow up with Dr. Elsworth Soho in -3  months and As needed  with PFTs  and As needed   Please contact office for sooner follow up if symptoms do not improve or worsen or seek emergency care

## 2019-07-11 NOTE — Patient Instructions (Addendum)
Continue Dulera 2 puffs Twice daily  , rinse after use.  Continue on Duoneb Three times a day , may use extra if needed every 4hrs .  Wear Oxygen 2l/m rest and 3l/m with activity .  Activity as tolerated  Follow up with Dr. Elsworth Soho in -3  months and As needed  with PFTs  and As needed   Please contact office for sooner follow up if symptoms do not improve or worsen or seek emergency care

## 2019-07-11 NOTE — Assessment & Plan Note (Signed)
Continue on oxygen 2 L with rest and with 3 L at activity.  O2 saturation goals greater than 88 to 90%

## 2019-07-28 ENCOUNTER — Other Ambulatory Visit: Payer: Self-pay | Admitting: Pulmonary Disease

## 2019-07-28 DIAGNOSIS — J449 Chronic obstructive pulmonary disease, unspecified: Secondary | ICD-10-CM

## 2019-08-02 ENCOUNTER — Other Ambulatory Visit: Payer: Self-pay | Admitting: Family Medicine

## 2019-08-02 DIAGNOSIS — I10 Essential (primary) hypertension: Secondary | ICD-10-CM

## 2019-08-08 ENCOUNTER — Ambulatory Visit: Payer: Medicaid Other | Attending: Family Medicine | Admitting: Family Medicine

## 2019-08-08 ENCOUNTER — Other Ambulatory Visit: Payer: Self-pay

## 2019-08-08 ENCOUNTER — Encounter: Payer: Self-pay | Admitting: Family Medicine

## 2019-08-08 VITALS — BP 130/83 | HR 80 | Temp 97.7°F | Ht 59.0 in | Wt 168.2 lb

## 2019-08-08 DIAGNOSIS — J449 Chronic obstructive pulmonary disease, unspecified: Secondary | ICD-10-CM

## 2019-08-08 DIAGNOSIS — M1990 Unspecified osteoarthritis, unspecified site: Secondary | ICD-10-CM | POA: Insufficient documentation

## 2019-08-08 DIAGNOSIS — Z7901 Long term (current) use of anticoagulants: Secondary | ICD-10-CM | POA: Diagnosis not present

## 2019-08-08 DIAGNOSIS — I10 Essential (primary) hypertension: Secondary | ICD-10-CM | POA: Insufficient documentation

## 2019-08-08 DIAGNOSIS — Z79899 Other long term (current) drug therapy: Secondary | ICD-10-CM | POA: Insufficient documentation

## 2019-08-08 DIAGNOSIS — F329 Major depressive disorder, single episode, unspecified: Secondary | ICD-10-CM | POA: Insufficient documentation

## 2019-08-08 DIAGNOSIS — K219 Gastro-esophageal reflux disease without esophagitis: Secondary | ICD-10-CM | POA: Diagnosis not present

## 2019-08-08 DIAGNOSIS — E785 Hyperlipidemia, unspecified: Secondary | ICD-10-CM | POA: Diagnosis not present

## 2019-08-08 DIAGNOSIS — R7303 Prediabetes: Secondary | ICD-10-CM | POA: Diagnosis not present

## 2019-08-08 DIAGNOSIS — Z87891 Personal history of nicotine dependence: Secondary | ICD-10-CM | POA: Insufficient documentation

## 2019-08-08 DIAGNOSIS — F411 Generalized anxiety disorder: Secondary | ICD-10-CM | POA: Diagnosis not present

## 2019-08-08 DIAGNOSIS — Z7902 Long term (current) use of antithrombotics/antiplatelets: Secondary | ICD-10-CM | POA: Diagnosis not present

## 2019-08-08 MED ORDER — SERTRALINE HCL 50 MG PO TABS: 50.0000 mg | ORAL_TABLET | Freq: Every day | ORAL | 3 refills | Status: DC

## 2019-08-08 NOTE — Progress Notes (Signed)
Established Patient Office Visit  Subjective:  Patient ID: Monica Scott, female    DOB: 06/08/1955  Age: 64 y.o. MRN: [id number]  CC: Follow-up COPD  HPI Monica Scott, 64 year old female, being seen in follow-up of chronic medical issues including COPD, prediabetes and she has complaints of ongoing anxiety.  She is no longer working.  She feels that she tends to feel a lot of time worrying about things that she has no control of and just worrying in general.  She reports that she is not wearing oxygen at today's visit as she currently only has access to the use of portable oxygen tanks which are heavy and tiring therefore she often does not bring portable oxygen with her.  She feels that her COPD is stable at this time.  She denies any recent increase in cough, shortness of breath or chest congestion.  No recent fever or chills.  She denies increased thirst or urinary frequency related to her prediabetes.    Past Medical History:  Diagnosis Date  . Arthritis   . Asthma   . Bladder disease   . COPD (chronic obstructive pulmonary disease) ([place])   . Depression   . GERD (gastroesophageal reflux disease)   . Hyperlipidemia   . Hypertension   . Peptic ulcer     Past Surgical History:  Procedure Laterality Date  . ABDOMINAL HYSTERECTOMY    . BIOPSY  04/24/2018   Procedure: BIOPSY;  Surgeon: Daneil Dolin, MD;  [facility];  Service: Endoscopy;;  esophagus  . COLONOSCOPY WITH PROPOFOL N/A 04/24/2018   Procedure: COLONOSCOPY WITH PROPOFOL;  Surgeon: Daneil Dolin, MD;  [facility];  Service: Endoscopy;  Laterality: N/A;  9:00am  . ESOPHAGOGASTRODUODENOSCOPY (EGD) WITH PROPOFOL N/A 04/24/2018   Procedure: ESOPHAGOGASTRODUODENOSCOPY (EGD) WITH PROPOFOL;  Surgeon: Daneil Dolin, MD;  [facility];  Service: Endoscopy;  Laterality: N/A;  Venia Minks DILATION N/A 04/24/2018   Procedure: Venia Minks DILATION;  Surgeon: Daneil Dolin, MD;  [facility];  Service: Endoscopy;  Laterality: N/A;  . PILONIDAL CYST EXCISION     age 29  . POLYPECTOMY  04/24/2018   Procedure: POLYPECTOMY;  Surgeon: Daneil Dolin, MD;  [facility];  Service: Endoscopy;;  colon     Family History  Problem Relation Age of Onset  . Alcohol abuse Mother   . Arthritis Mother   . Cancer Mother   . Depression Mother   . Early death Mother   . Alcohol abuse Father   . Depression Father   . Alcohol abuse Sister   . Depression Sister   . Alcohol abuse Brother   . Alcohol abuse Daughter   . Depression Daughter   . Alcohol abuse Son   . COPD Maternal Grandfather   . Heart disease Maternal Grandfather   . Hypertension Maternal Grandfather   . Colon cancer Brother        deceased at age 62    Social History   Socioeconomic History  . Marital status: Single    Spouse name: Not on file  . Number of children: Not on file  . Years of education: Not on file  . Highest education level: Not on file  Occupational History  . Not on file  Tobacco Use  . Smoking status: Former Smoker    Packs/day: 1.50    Years: 40.00    Pack years: 60.00    Types: Cigarettes  Quit date: 04/19/2010    Years since quitting: 9.3  . Smokeless tobacco: Never Used  Substance and Sexual Activity  . Alcohol use: No    Comment: Former ETOH. none since 2000  . Drug use: No  . Sexual activity: Yes    Birth control/protection: Surgical  Other Topics Concern  . Not on file  Social History Narrative  . Not on file   Social Determinants of Health   Financial Resource Strain:   . Difficulty of Paying Living Expenses:   Food Insecurity:   . Worried About Charity fundraiser in the Last Year:   . Arboriculturist in the Last Year:   Transportation Needs:   . Film/video editor (Medical):   Marland Kitchen Lack of Transportation (Non-Medical):   Physical Activity:   . Days of Exercise per Week:   . Minutes of Exercise per Session:   Stress:   . Feeling of Stress :     Social Connections:   . Frequency of Communication with Friends and Family:   . Frequency of Social Gatherings with Friends and Family:   . Attends Religious Services:   . Active Member of Clubs or Organizations:   . Attends Archivist Meetings:   Marland Kitchen Marital Status:   Intimate Partner Violence:   . Fear of Current or Ex-Partner:   . Emotionally Abused:   Marland Kitchen Physically Abused:   . Sexually Abused:     Outpatient Medications Prior to Visit  Medication Sig Dispense Refill  . albuterol (VENTOLIN HFA) 108 (90 Base) MCG/ACT inhaler Inhale 1-2 puffs into the lungs every 6 (six) hours as needed for wheezing or shortness of breath. 6.7 g 5  . baclofen (LIORESAL) 10 MG tablet TAKE 1/2 TO 1 (ONE-HALF TO ONE) TABLET BY MOUTH THREE TIMES DAILY AS NEEDED FOR MUSCLE SPASM 30 tablet 0  . FLUoxetine (PROZAC) 20 MG tablet Take 20 mg by mouth daily.    Marland Kitchen gabapentin (NEURONTIN) 400 MG capsule Take 1 capsule (400 mg total) by mouth 3 (three) times daily. 90 capsule 11  . ibuprofen (ADVIL,MOTRIN) 200 MG tablet Take 400 mg by mouth 3 (three) times daily as needed for moderate pain.    Marland Kitchen ipratropium-albuterol (DUONEB) 0.5-2.5 (3) MG/3ML SOLN USE 1 AMPULE IN NEBULIZER EVERY 6 HOURS AS NEEDED 360 mL 11  . meloxicam (MOBIC) 15 MG tablet Take 0.5-1 tablets (7.5-15 mg total) by mouth daily as needed for pain. 30 tablet 6  . mometasone-formoterol (DULERA) 100-5 MCG/ACT AERO Inhale 2 puffs into the lungs 2 (two) times daily. 13 g 5  . nabumetone (RELAFEN) 750 MG tablet Take 1 tablet (750 mg total) by mouth 2 (two) times daily as needed. 60 tablet 6  . OLANZapine (ZYPREXA) 5 MG tablet Take 10 mg by mouth at bedtime.     Marland Kitchen olmesartan (BENICAR) 40 MG tablet Take 1 tablet by mouth once daily 30 tablet 0  . omeprazole (PRILOSEC) 40 MG capsule TAKE 1 CAPSULE (40 MG TOTAL) BY MOUTH 2 (TWO) TIMES DAILY. TO REDUCE STOMACH ACID 60 capsule 0  . amLODipine (NORVASC) 5 MG tablet One pill twice daily to lower blood  pressure 180 tablet 1  . loperamide (IMODIUM A-D) 2 MG tablet Take 4 mg by mouth 2 (two) times daily as needed for diarrhea or loose stools.    . naproxen (NAPROSYN) 500 MG tablet Take 1 tablet (500 mg total) by mouth 2 (two) times daily with a meal. As needed for pain (Patient  not taking: Reported on 08/08/2019) 60 tablet 2   No facility-administered medications prior to visit.    No Known Allergies  ROS Review of Systems  Constitutional: Positive for fatigue. Negative for chills and fever.  HENT: Negative for sore throat and trouble swallowing.   Respiratory: Positive for shortness of breath (With exertion). Negative for cough.   Cardiovascular: Negative for chest pain and palpitations.  Gastrointestinal: Negative for abdominal pain, constipation, diarrhea and nausea.  Endocrine: Negative for polydipsia, polyphagia and polyuria.  Genitourinary: Negative for dysuria and frequency.  Musculoskeletal: Positive for back pain (Occasional). Negative for arthralgias.  Skin: Negative for rash and wound.  Neurological: Negative for dizziness and headaches.  Hematological: Negative for adenopathy. Does not bruise/bleed easily.  Psychiatric/Behavioral: Negative for self-injury and suicidal ideas. The patient is nervous/anxious.       Objective:    Physical Exam  Constitutional: She is oriented to person, place, and time. She appears well-developed and well-nourished.  Well-nourished well-developed overweight for height/obese older female in no acute distress.  She is not wearing oxygen at today's visit.  She is wearing a mask as per office COVID-19 protocols.  Neck: No JVD present. No thyromegaly present.  Cardiovascular: Normal rate and regular rhythm.  Pulmonary/Chest: Effort normal and breath sounds normal.  Abdominal: Soft. There is no abdominal tenderness. There is no rebound and no guarding.  Musculoskeletal:        General: No tenderness or edema.     Cervical back: Normal range of  motion and neck supple.  Lymphadenopathy:    She has no cervical adenopathy.  Neurological: She is alert and oriented to person, place, and time.  Skin: Skin is warm and dry.  Psychiatric: She has a normal mood and affect. Her behavior is normal.  Nursing note and vitals reviewed.   BP 130/83   Pulse 80   Temp 97.7 F (36.5 C) (Temporal)   Ht 4\' 11"  (1.499 m)   Wt 168 lb 3.2 oz (76.3 kg)   SpO2 (!) 89%   BMI 33.97 kg/m  Wt Readings from Last 3 Encounters:  08/08/19 168 lb 3.2 oz (76.3 kg)  07/11/19 165 lb 9.6 oz (75.1 kg)  04/02/19 184 lb 6.4 oz (83.6 kg)     Health Maintenance Due  Topic Date Due  . Hepatitis C Screening  Never done  . TETANUS/TDAP  Never done  . PAP SMEAR-Modifier  Never done  . MAMMOGRAM  12/01/2014     Lab Results  Component Value Date   WBC 8.7 11/20/2018   HGB 12.5 11/20/2018   HCT 39.8 11/20/2018   MCV 91.1 11/20/2018   PLT 364 11/20/2018   Lab Results  Component Value Date   NA 148 (H) 08/08/2019   K 3.7 08/08/2019   CO2 29 08/08/2019   GLUCOSE 85 08/08/2019   BUN 9 08/08/2019   CREATININE 0.84 08/08/2019   BILITOT 0.2 12/06/2017   ALKPHOS 128 (H) 12/06/2017   AST 14 12/06/2017   ALT 12 12/06/2017   PROT 6.2 12/06/2017   ALBUMIN 4.2 12/06/2017   CALCIUM 9.4 08/08/2019   ANIONGAP 11 11/20/2018   Lab Results  Component Value Date   CHOL 249 (H) 10/18/2017   Lab Results  Component Value Date   HDL 64 10/18/2017   Lab Results  Component Value Date   LDLCALC 164 (H) 10/18/2017   Lab Results  Component Value Date   TRIG 106 10/18/2017   Lab Results  Component Value Date  CHOLHDL 3.9 10/18/2017   Lab Results  Component Value Date   HGBA1C 6.0 (H) 08/08/2019      Assessment & Plan:  1. Chronic obstructive pulmonary disease, unspecified COPD type ([place]) Stable at this time.  She is to continue the use of Dulera and DuoNeb solution.  Oxygen use encouraged.  2. Prediabetes Last hemoglobin A1c on 12/14/2018 was 6.0.   She was encouraged to continue low carbohydrate diet and remain active.  She will have repeat basic metabolic panel and hemoglobin A1c at today's visit and based on the results she will be notified regarding the need for further intervention such as Metformin or other medications. - Basic Metabolic Panel - Hemoglobin A1c  3. GAD (generalized anxiety disorder) She agrees to try sertraline 50 mg daily to help with anxiety.  She is advised to take half pill, 25 mg nightly, for the first 7 nights and then increase to 1 whole pill nightly.  She has been asked to call or return to clinic if she has any issues with the medication.  Written instructions were provided as part of after visit summary. - sertraline (ZOLOFT) 50 MG tablet; Take 1 tablet (50 mg total) by mouth daily.  Dispense: 30  tablet; Refill: 3   Meds ordered this encounter  Medications  . sertraline (ZOLOFT) 50 MG tablet    Sig: Take 1 tablet (50 mg total) by mouth daily.    Dispense:  30 tablet    Refill:  3    An After Visit Summary was printed and given to the patient.   Follow-up: Return for new medication- f/u in 3-4 weeks; tele okay.    [name], MD

## 2019-08-08 NOTE — Patient Instructions (Signed)
Please start sertraline at 25 mg, 1/2 pill at bedtime for 7 nights then increase to 1 whole pill. Decrease fluoxetine to 1/2 pill (10 mg) once per day for 7 days then every other day for 7 days then stop

## 2019-08-08 NOTE — Progress Notes (Signed)
Med refills  Talk about her Anxiety   36.5  168.2lb

## 2019-08-09 ENCOUNTER — Other Ambulatory Visit: Payer: Self-pay | Admitting: Family Medicine

## 2019-08-09 DIAGNOSIS — I10 Essential (primary) hypertension: Secondary | ICD-10-CM

## 2019-08-09 LAB — HEMOGLOBIN A1C
Est. average glucose Bld gHb Est-mCnc: 126 mg/dL
Hgb A1c MFr Bld: 6 % — ABNORMAL HIGH (ref 4.8–5.6)

## 2019-08-09 LAB — BASIC METABOLIC PANEL WITH GFR
BUN/Creatinine Ratio: 11 — ABNORMAL LOW (ref 12–28)
BUN: 9 mg/dL (ref 8–27)
CO2: 29 mmol/L (ref 20–29)
Calcium: 9.4 mg/dL (ref 8.7–10.3)
Chloride: 102 mmol/L (ref 96–106)
Creatinine, Ser: 0.84 mg/dL (ref 0.57–1.00)
GFR calc Af Amer: 86 mL/min/1.73
GFR calc non Af Amer: 74 mL/min/1.73
Glucose: 85 mg/dL (ref 65–99)
Potassium: 3.7 mmol/L (ref 3.5–5.2)
Sodium: 148 mmol/L — ABNORMAL HIGH (ref 134–144)

## 2019-08-14 ENCOUNTER — Other Ambulatory Visit: Payer: Self-pay | Admitting: Family Medicine

## 2019-08-15 ENCOUNTER — Telehealth: Payer: Self-pay | Admitting: Adult Health

## 2019-08-15 DIAGNOSIS — J449 Chronic obstructive pulmonary disease, unspecified: Secondary | ICD-10-CM

## 2019-08-15 NOTE — Telephone Encounter (Signed)
Called and spoke with pt who stated she would like to get a portable nebulizer from [place] has been placed. Nothing further needed.

## 2019-08-17 ENCOUNTER — Other Ambulatory Visit: Payer: Self-pay | Admitting: Pulmonary Disease

## 2019-08-27 ENCOUNTER — Other Ambulatory Visit: Payer: Self-pay | Admitting: Pulmonary Disease

## 2019-08-28 NOTE — Telephone Encounter (Signed)
Dr. Elsworth Soho, please advise if you are okay refilling pt's med or if this needs to be deferred to pt's PCP.

## 2019-08-28 NOTE — Telephone Encounter (Signed)
Defer to PCP please

## 2019-08-31 ENCOUNTER — Other Ambulatory Visit: Payer: Self-pay | Admitting: Pulmonary Disease

## 2019-09-06 ENCOUNTER — Other Ambulatory Visit: Payer: Self-pay | Admitting: Family Medicine

## 2019-09-06 DIAGNOSIS — R1013 Epigastric pain: Secondary | ICD-10-CM

## 2019-09-06 DIAGNOSIS — K219 Gastro-esophageal reflux disease without esophagitis: Secondary | ICD-10-CM

## 2019-09-07 ENCOUNTER — Other Ambulatory Visit: Payer: Self-pay | Admitting: Family Medicine

## 2019-09-13 ENCOUNTER — Other Ambulatory Visit: Payer: Self-pay | Admitting: Family Medicine

## 2019-09-29 ENCOUNTER — Other Ambulatory Visit (HOSPITAL_COMMUNITY)
Admission: RE | Admit: 2019-09-29 | Discharge: 2019-09-29 | Disposition: A | Discharge status: Home / Self Care | Payer: Medicaid Other | Source: Ambulatory Visit | Attending: Adult Health | Admitting: Adult Health

## 2019-09-29 DIAGNOSIS — Z20822 Contact with and (suspected) exposure to covid-19: Secondary | ICD-10-CM | POA: Insufficient documentation

## 2019-09-29 DIAGNOSIS — Z01812 Encounter for preprocedural laboratory examination: Secondary | ICD-10-CM | POA: Diagnosis not present

## 2019-09-29 LAB — SARS CORONAVIRUS 2 (TAT 6-24 HRS): SARS Coronavirus 2: NEGATIVE

## 2019-10-02 ENCOUNTER — Other Ambulatory Visit: Payer: Self-pay | Admitting: Pulmonary Disease

## 2019-10-02 ENCOUNTER — Other Ambulatory Visit: Payer: Self-pay | Admitting: Family Medicine

## 2019-10-03 ENCOUNTER — Encounter: Payer: Self-pay | Admitting: Pulmonary Disease

## 2019-10-03 ENCOUNTER — Ambulatory Visit: Payer: Medicaid Other | Admitting: Pulmonary Disease

## 2019-10-03 ENCOUNTER — Other Ambulatory Visit: Payer: Self-pay

## 2019-10-03 ENCOUNTER — Ambulatory Visit (INDEPENDENT_AMBULATORY_CARE_PROVIDER_SITE_OTHER): Payer: Medicaid Other | Admitting: Pulmonary Disease

## 2019-10-03 DIAGNOSIS — J432 Centrilobular emphysema: Secondary | ICD-10-CM | POA: Diagnosis not present

## 2019-10-03 DIAGNOSIS — J9611 Chronic respiratory failure with hypoxia: Secondary | ICD-10-CM

## 2019-10-03 DIAGNOSIS — J449 Chronic obstructive pulmonary disease, unspecified: Secondary | ICD-10-CM

## 2019-10-03 LAB — PULMONARY FUNCTION TEST
DL/VA % pred: 82 %
DL/VA: 3.58 ml/min/mmHg/L
DLCO cor % pred: 73 %
DLCO cor: 12.48 ml/min/mmHg
DLCO unc % pred: 73 %
DLCO unc: 12.48 ml/min/mmHg
FEF 25-75 Post: 0.42 L/sec
FEF 25-75 Pre: 0.39 L/sec
FEF2575-%Change-Post: 7 %
FEF2575-%Pred-Post: 21 %
FEF2575-%Pred-Pre: 20 %
FEV1-%Change-Post: 2 %
FEV1-%Pred-Post: 47 %
FEV1-%Pred-Pre: 46 %
FEV1-Post: 0.97 L
FEV1-Pre: 0.94 L
FEV1FVC-%Change-Post: 2 %
FEV1FVC-%Pred-Pre: 66 %
FEV6-%Change-Post: 0 %
FEV6-%Pred-Post: 70 %
FEV6-%Pred-Pre: 70 %
FEV6-Post: 1.8 L
FEV6-Pre: 1.8 L
FEV6FVC-%Change-Post: 0 %
FEV6FVC-%Pred-Post: 102 %
FEV6FVC-%Pred-Pre: 102 %
FVC-%Change-Post: 0 %
FVC-%Pred-Post: 68 %
FVC-%Pred-Pre: 68 %
FVC-Post: 1.83 L
FVC-Pre: 1.83 L
Post FEV1/FVC ratio: 53 %
Post FEV6/FVC ratio: 98 %
Pre FEV1/FVC ratio: 51 %
Pre FEV6/FVC Ratio: 98 %
RV % pred: 215 %
RV: 3.97 L
TLC % pred: 138 %
TLC: 6.05 L

## 2019-10-03 MED ORDER — BREZTRI AEROSPHERE 160-9-4.8 MCG/ACT IN AERO
2.0000 | INHALATION_SPRAY | Freq: Two times a day (BID) | RESPIRATORY_TRACT | 0 refills | Status: DC
Start: 2019-10-03 — End: 2019-10-08

## 2019-10-03 MED ORDER — ALBUTEROL SULFATE HFA 108 (90 BASE) MCG/ACT IN AERS: 1.0000 | INHALATION_SPRAY | Freq: Four times a day (QID) | RESPIRATORY_TRACT | 6 refills | Status: DC | PRN

## 2019-10-03 NOTE — Patient Instructions (Signed)
  You have severe COPD, lung capacity is at 46%  Trial of Breztri -2 puffs twice daily instead of Dulera, rinse mouth after use. Call me for prescription if this works.  If we change to this medication, then we will change from duo nebs to plain albuterol for your nebs

## 2019-10-03 NOTE — Addendum Note (Signed)
Addended by: Merrilee Seashore on: 10/03/2019 11:52 AM   Modules accepted: Orders

## 2019-10-03 NOTE — Progress Notes (Signed)
   Subjective:    Patient ID: Monica Scott, female    DOB: 1956/03/04, 64 y.o.   MRN: [id number]  HPI  64 yo ex-smoker  followed for COPD OPD and oxygen dependent respiratory failure started on oxygen July 2019 PMH-  mild obstructive sleep apnea declined CPAP Neurogenic bladder with self-catheterization  She continues to be short of breath.  Remains on Dulera, uses duo nebs 3 times daily. She has O2 2 L but admits to not using those except as needed.  Occasionally saturations will drop at home to 86 to 87%, currently at 93%.  Does not use nocturnal O2. She was turned down for disability.  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  N PSG was done on February 13, 2019 that showed mild obstructive sleep apnea with AHI at 7.6 and SPO2 low at 73%.   Alpha-1 antitrypsin-MM, 139  Review of Systems Patient denies significant dyspnea,cough, hemoptysis,  chest pain, palpitations, pedal edema, orthopnea, paroxysmal nocturnal dyspnea, lightheadedness, nausea, vomiting, abdominal or  leg pains      Objective:   Physical Exam  Gen. Pleasant, obese, in no distress ENT - no lesions, no post nasal drip Neck: No JVD, no thyromegaly, no carotid bruits Lungs: no use of accessory muscles, no dullness to percussion, decreased without rales or rhonchi  Cardiovascular: Rhythm regular, heart sounds  normal, no murmurs or gallops, no peripheral edema Musculoskeletal: No deformities, no cyanosis or clubbing , no tremors        Assessment & Plan:

## 2019-10-03 NOTE — Progress Notes (Signed)
Full PFT performed today. °

## 2019-10-03 NOTE — Assessment & Plan Note (Signed)
FEV1 of 46% indicating severe airway obstruction , will escalate to triple therapy since she is still symptomatic Trial of Breztri -2 puffs twice daily instead of Dulera, rinse mouth after use. Call me for prescription if this works.  If we change to this medication, then we will change from duo nebs to plain albuterol for your nebs We discussed signs and symptoms of exacerbation and she will call us earlier should this happen

## 2019-10-03 NOTE — Addendum Note (Signed)
Addended by: Merrilee Seashore on: 10/03/2019 12:01 PM   Modules accepted: Orders

## 2019-10-03 NOTE — Assessment & Plan Note (Signed)
Oxygen saturation is borderline at 93%, she states that she does desaturate at home with exertion to 86 to 87%. I have encouraged her to use oxygen during sleep and if she does desaturate less than 88%.  I do not doubt that she if has an exacerbation she will need continuous oxygen

## 2019-10-08 ENCOUNTER — Telehealth: Payer: Self-pay | Admitting: Pulmonary Disease

## 2019-10-08 MED ORDER — BREZTRI AEROSPHERE 160-9-4.8 MCG/ACT IN AERO: 2.0000 | INHALATION_SPRAY | Freq: Two times a day (BID) | RESPIRATORY_TRACT | 5 refills | Status: DC

## 2019-10-08 MED ORDER — ALBUTEROL SULFATE (2.5 MG/3ML) 0.083% IN NEBU
2.5000 mg | INHALATION_SOLUTION | Freq: Four times a day (QID) | RESPIRATORY_TRACT | 5 refills | Status: DC | PRN
Start: 2019-10-08 — End: 2021-09-15

## 2019-10-08 NOTE — Telephone Encounter (Signed)
Patient called and requested a prescription of Breztri be sent to [facility].  Patient stated she also needed a neb change to remain on Breztri. Albuterol nebs sent to requested pharmacy. Nothing further at this tie.   Per Dr Elsworth Soho- 10/03/19  You have severe COPD, lung capacity is at 46%  Trial of Breztri -2 puffs twice daily instead of Dulera, rinse mouth after use. Call me for prescription if this works.  If we change to this medication, then we will change from duo nebs to plain albuterol for your nebs

## 2019-10-15 ENCOUNTER — Telehealth: Payer: Self-pay | Admitting: Pulmonary Disease

## 2019-10-15 NOTE — Telephone Encounter (Signed)
Monica Scott, is there any way you can help check benefits to see what inhalers pt's insurance will cover.

## 2019-10-17 NOTE — Telephone Encounter (Signed)
Continue Dulera that she was on. Please check if they will cover Spiriva Respimat daily

## 2019-10-17 NOTE — Telephone Encounter (Signed)
[place] Medicaid requires prior authorization for [facility] or Trelegy. Patient must have tried/failed 2 preferred agents: Advair diskus, Advair hfa, Dulera, or Symbicort.

## 2019-10-17 NOTE — Telephone Encounter (Signed)
Dr. Elsworth Soho they patients insurance will not cover Brezti. They requires prior authorization for [facility] or Trelegy. Patient must have tried/failed 2 preferred agents: Advair diskus, Advair hfa, Dulera, or Symbicort.   Please advise

## 2019-10-17 NOTE — Telephone Encounter (Signed)
Spiriva Respimat is not a preferred drug.  Patient will still have to try and fail either Advair, Dulera, or Symbicort before it will be covered.

## 2019-10-18 NOTE — Telephone Encounter (Signed)
Patient will still have to try and fail at least two of these Advair, Dulera, or Symbicort before it will be covered.    RA advise

## 2019-10-18 NOTE — Telephone Encounter (Signed)
OK to stay on dulera then

## 2019-10-18 NOTE — Telephone Encounter (Signed)
She is already on dulera & still symptomatic  So OK to fill out PA for breztri

## 2019-10-18 NOTE — Telephone Encounter (Signed)
Called and spoke with patient letting her know that due to her insurance not covering Breztri she has to try and fail at least 2 medications (advair, dulera, symbicort) before they would cover it. Patient currently on [facility] and has tried/failed Symbicort. Informed patient that she needs to continue using Dulera so that we can make sure it does/doesn't work for her so that we are able to document process. Patient expressed understanding. Asked patient to call the office if she needed anything or if her symptoms got worse. Nothing further needed at this time.

## 2019-10-30 ENCOUNTER — Other Ambulatory Visit: Payer: Self-pay | Admitting: Family Medicine

## 2019-11-07 ENCOUNTER — Telehealth: Payer: Self-pay

## 2019-11-07 NOTE — Telephone Encounter (Signed)
Called pt to schedule appt per provider in response to fax from [facility] requesting catheter supplies.  Per pt she does not need any catheter supplies at this time. Pt states she has enough catheter supplies to last for the next six months. Pt states she did the company.

## 2019-11-24 ENCOUNTER — Encounter (HOSPITAL_BASED_OUTPATIENT_CLINIC_OR_DEPARTMENT_OTHER): Payer: Self-pay

## 2019-11-24 ENCOUNTER — Other Ambulatory Visit: Payer: Self-pay

## 2019-11-24 ENCOUNTER — Emergency Department (HOSPITAL_BASED_OUTPATIENT_CLINIC_OR_DEPARTMENT_OTHER)
Admission: EM | Admit: 2019-11-24 | Discharge: 2019-11-24 | Disposition: A | Discharge status: Home / Self Care | Payer: Medicaid Other | Attending: Emergency Medicine | Admitting: Emergency Medicine

## 2019-11-24 DIAGNOSIS — Z79899 Other long term (current) drug therapy: Secondary | ICD-10-CM | POA: Diagnosis not present

## 2019-11-24 DIAGNOSIS — J449 Chronic obstructive pulmonary disease, unspecified: Secondary | ICD-10-CM | POA: Insufficient documentation

## 2019-11-24 DIAGNOSIS — Z87891 Personal history of nicotine dependence: Secondary | ICD-10-CM | POA: Insufficient documentation

## 2019-11-24 DIAGNOSIS — J45909 Unspecified asthma, uncomplicated: Secondary | ICD-10-CM | POA: Diagnosis not present

## 2019-11-24 DIAGNOSIS — K0889 Other specified disorders of teeth and supporting structures: Secondary | ICD-10-CM | POA: Diagnosis present

## 2019-11-24 DIAGNOSIS — I10 Essential (primary) hypertension: Secondary | ICD-10-CM | POA: Insufficient documentation

## 2019-11-24 DIAGNOSIS — K029 Dental caries, unspecified: Secondary | ICD-10-CM | POA: Insufficient documentation

## 2019-11-24 MED ORDER — OXYCODONE-ACETAMINOPHEN 5-325 MG PO TABS: 1.0000 | ORAL_TABLET | ORAL | 0 refills | Status: AC | PRN

## 2019-11-24 MED ORDER — KETOROLAC TROMETHAMINE 60 MG/2ML IM SOLN
30.0000 mg | Freq: Once | INTRAMUSCULAR | Status: AC
  Administered 2019-11-24: 30 mg via INTRAMUSCULAR
  Filled 2019-11-24: qty 2

## 2019-11-24 MED ORDER — PENICILLIN V POTASSIUM 500 MG PO TABS
500.0000 mg | ORAL_TABLET | Freq: Four times a day (QID) | ORAL | 0 refills | Status: AC
Start: 2019-11-24 — End: 2019-12-01

## 2019-11-24 NOTE — ED Triage Notes (Addendum)
Pt states pain right upper side of mouth beginning yesterday, awoke this morning with facial swelling and increased pain. Pt reports several broken teeth in the area of pain/swelling.  Does not have a regular dentist.  Took ibuprofen 0700 with little improvement

## 2019-11-24 NOTE — Discharge Instructions (Addendum)
Please take antibiotics as prescribed.  Recommend Tylenol and Motrin as needed for pain control.  For breakthrough pain, can take the prescribed Percocet.  While the antibiotics and pain medicines will hopefully help, you ultimately need to be seen by a dentist for definitive treatment. I would also recommend recheck with your primary doctor.   Ideally you should see a dentist on Monday or Tuesday of this coming week.  If while awaiting your dentist appointment, you develop worsening swelling, redness on your face or fever, please return to ER for reassessment.

## 2019-11-25 NOTE — ED Provider Notes (Signed)
[facility] EMERGENC[facility] DEPARTMENT Provider Note   CSN: [id number] Arrival date & time: 11/24/19  0750     History Chief Complaint  Patient presents with  . Dental Pain    Monica Scott is a 64 y.o. female.  Presents ER with concern for dental pain.  Patient reports that since yesterday she has noted pain, some swelling to her right upper teeth.  No redness over her face.  No fevers.  Pain is 8 out of 10 in severity sharp stabbing, had slight improvement with ibuprofen.  Past medical history asthma, COPD, hyperlipidemia, hypertension. HPI     Past Medical History:  Diagnosis Date  . Arthritis   . Asthma   . Bladder disease   . COPD (chronic obstructive pulmonary disease) ([place])   . Depression   . GERD (gastroesophageal reflux disease)   . Hyperlipidemia   . Hypertension   . Peptic ulcer     Patient Active Problem List   Diagnosis Date Noted  . Dyspnea 02/05/2019  . Chronic respiratory failure with hypoxia ([place]) 02/05/2019  . Daytime sleepiness 02/05/2019  . Pelvic relaxation due to cystocele, midline 01/16/2019  . Emphysema of lung ([place]) 06/14/2018  . Aortic atherosclerosis ([place]) 06/14/2018  . Atherosclerosis of coronary artery 06/14/2018  . Thoracic aortic ectasia ([place]) 05/19/2018  . Esophageal dysphagia 03/01/2018  . Family history of colon cancer 03/01/2018  . Diarrhea 03/01/2018  . Near syncope 10/17/2017  . Hyperlipidemia 10/17/2017  . GERD (gastroesophageal reflux disease) 10/17/2017  . Bladder disease or syndrome 10/17/2017  . Depression 10/17/2017  . Hypertension, essential, benign 02/19/2017  . COPD (chronic obstructive pulmonary disease) ([place]) 02/11/2017  . Chest pain 02/12/2015  . Referral of patient 02/12/2015    Past Surgical History:  Procedure Laterality Date  . ABDOMINAL HYSTERECTOMY    . BIOPSY  04/24/2018   Procedure: BIOPSY;  Surgeon: Daneil Dolin, MD;  Location: [facility];  Service: Endoscopy;;  esophagus  .  COLONOSCOPY WITH PROPOFOL N/A 04/24/2018   Procedure: COLONOSCOPY WITH PROPOFOL;  Surgeon: Daneil Dolin, MD;  Location: [facility];  Service: Endoscopy;  Laterality: N/A;  9:00am  . ESOPHAGOGASTRODUODENOSCOPY (EGD) WITH PROPOFOL N/A 04/24/2018   Procedure: ESOPHAGOGASTRODUODENOSCOPY (EGD) WITH PROPOFOL;  Surgeon: Daneil Dolin, MD;  Location: [facility];  Service: Endoscopy;  Laterality: N/A;  Venia Minks DILATION N/A 04/24/2018   Procedure: Venia Minks DILATION;  Surgeon: Daneil Dolin, MD;  Location: [facility];  Service: Endoscopy;  Laterality: N/A;  . PILONIDAL CYST EXCISION     age 66  . POLYPECTOMY  04/24/2018   Procedure: POLYPECTOMY;  Surgeon: Daneil Dolin, MD;  Location: [facility];  Service: Endoscopy;;  colon      OB History   No obstetric history on file.     Family History  Problem Relation Age of Onset  . Alcohol abuse Mother   . Arthritis Mother   . Cancer Mother   . Depression Mother   . Early death Mother   . Alcohol abuse Father   . Depression Father   . Alcohol abuse Sister   . Depression Sister   . Alcohol abuse Brother   . Alcohol abuse Daughter   . Depression Daughter   . Alcohol abuse Son   . COPD Maternal Grandfather   . Heart disease Maternal Grandfather   . Hypertension Maternal Grandfather   . Colon cancer Brother        deceased at age 18  Social History   Tobacco Use  . Smoking status: Former Smoker    Packs/day: 1.50    Years: 40.00    Pack years: 60.00    Types: Cigarettes    Quit date: 04/19/2010    Years since quitting: 9.6  . Smokeless tobacco: Never Used  Vaping Use  . Vaping Use: Never used  Substance Use Topics  . Alcohol use: No    Comment: Former ETOH. none since 2000  . Drug use: No    Home Medications Prior to Admission medications   Medication Sig Start Date End Date Taking? Authorizing Provider  albuterol (PROVENTIL) (2.5 MG/3ML) 0.083% nebulizer solution Take 3 mLs (2.5 mg total) by nebulization  every 6 (six) hours as needed for wheezing or shortness of breath. 10/08/19   Rigoberto Noel, MD  albuterol (VENTOLIN HFA) 108 (90 Base) MCG/ACT inhaler Inhale 1-2 puffs into the lungs every 6 (six) hours as needed for wheezing or shortness of breath. 10/03/19   Rigoberto Noel, MD  amLODipine (NORVASC) 5 MG tablet TAKE 1 TABLET BY MOUTH TWICE DAILY TO  LOWER  BLOOD  PRESSURE 08/10/19   Fulp, Cammie, MD  baclofen (LIORESAL) 10 MG tablet TAKE 1/2 TO 1 (ONE-HALF TO ONE) TABLET BY MOUTH THREE TIMES DAILY AS NEEDED FOR MUSCLE SPASM 10/30/19   Hilts, Legrand Como, MD  Budeson-Glycopyrrol-Formoterol (BREZTRI AEROSPHERE) 160-9-4.8 MCG/ACT AERO Inhale 2 puffs into the lungs in the morning and at bedtime. 10/08/19   Rigoberto Noel, MD  FLUoxetine (PROZAC) 20 MG tablet Take 20 mg by mouth daily.    [provider]  gabapentin (NEURONTIN) 400 MG capsule Take 1 capsule (400 mg total) by mouth 3 (three) times daily. 12/06/17   Fulp, Cammie, MD  ibuprofen (ADVIL,MOTRIN) 200 MG tablet Take 400 mg by mouth 3 (three) times daily as needed for moderate pain.    [provider]  loperamide (IMODIUM A-D) 2 MG tablet Take 4 mg by mouth 2 (two) times daily as needed for diarrhea or loose stools.    [provider]  meloxicam (MOBIC) 15 MG tablet Take 0.5-1 tablets (7.5-15 mg total) by mouth daily as needed for pain. 03/05/19   Hilts, Legrand Como, MD  mometasone-formoterol (DULERA) 100-5 MCG/ACT AERO Inhale 2 puffs into the lungs 2 (two) times daily. 07/11/19   Parrett, Fonnie Mu, NP  nabumetone (RELAFEN) 750 MG tablet Take 1 tablet (750 mg total) by mouth 2 (two) times daily as needed. 03/05/19   Hilts, Legrand Como, MD  OLANZapine (ZYPREXA) 5 MG tablet Take 10 mg by mouth at bedtime.     [provider]  olmesartan (BENICAR) 40 MG tablet Take 1 tablet by mouth once daily 10/02/19   Rigoberto Noel, MD  omeprazole (PRILOSEC) 40 MG capsule TAKE 1 CAPSULE (40 MG TOTAL) BY MOUTH 2 (TWO) TIMES DAILY. TO REDUCE STOMACH ACID  09/07/19   Fulp, Cammie, MD  oxyCODONE-acetaminophen (PERCOCET) 5-325 MG tablet Take 1 tablet by mouth every 4 (four) hours as needed for up to 3 days for severe pain. 11/24/19 11/27/19  Lucrezia Starch, MD  penicillin v potassium (VEETID) 500 MG tablet Take 1 tablet (500 mg total) by mouth 4 (four) times daily for 7 days. 11/24/19 12/01/19  Lucrezia Starch, MD  sertraline (ZOLOFT) 50 MG tablet Take 1 tablet (50 mg total) by mouth daily. 08/08/19   Fulp, Cammie, MD  baclofen (LIORESAL) 10 MG tablet TAKE 1/2 TO 1 (ONE-HALF TO ONE) TABLET BY MOUTH THREE TIMES DAILY AS NEEDED  FOR MUSCLE SPASM 06/01/19   Hilts, Legrand Como, MD  baclofen (LIORESAL) 10 MG tablet TAKE 1/2 TO 1 TABLET BY MOUTH 3 TIMES DAILY AS NEEDED FOR MUSCLE SPASMS 08/14/19   Hilts, Legrand Como, MD    Allergies    Patient has no known allergies.  Review of Systems   Review of Systems  Constitutional: Negative for chills and fever.  HENT: Negative for ear pain and sore throat.   Eyes: Negative for pain and visual disturbance.  Respiratory: Negative for cough and shortness of breath.   Cardiovascular: Negative for chest pain and palpitations.  Gastrointestinal: Negative for abdominal pain and vomiting.  Genitourinary: Negative for dysuria and hematuria.  Musculoskeletal: Negative for arthralgias and back pain.  Skin: Negative for color change and rash.  Neurological: Negative for seizures and syncope.  All other systems reviewed and are negative.   Physical Exam Updated Vital Signs BP 133/84 (BP Location: Right Arm)   Pulse 88   Temp 98.6 F (37 C) (Oral)   Resp 16   Ht 4\' 11"  (1.499 m)   Wt 74.8 kg   SpO2 96%   BMI 33.33 kg/m   Physical Exam Vitals and nursing note reviewed.  Constitutional:      General: She is not in acute distress.    Appearance: She is well-developed.  HENT:     Head: Normocephalic and atraumatic.     Comments: Extensive dental caries, there is tenderness to palpation over her right front teeth, mild  surrounding swelling of gums, there is no significant facial swelling, no facial erythema, no palpable masses or induration Eyes:     Conjunctiva/sclera: Conjunctivae normal.  Cardiovascular:     Rate and Rhythm: Normal rate and regular rhythm.     Heart sounds: No murmur heard.   Pulmonary:     Effort: Pulmonary effort is normal. No respiratory distress.     Breath sounds: Normal breath sounds.  Abdominal:     Palpations: Abdomen is soft.     Tenderness: There is no abdominal tenderness.  Musculoskeletal:     Cervical back: Neck supple.  Skin:    General: Skin is warm and dry.  Neurological:     Mental Status: She is alert.     ED Results / Procedures / Treatments   Labs (all labs ordered are listed, but only abnormal results are displayed) Labs Reviewed - No data to display  EKG None  Radiology No results found.  Procedures Procedures (including critical care time)  Medications Ordered in ED Medications  ketorolac (TORADOL) injection 30 mg (30 mg Intramuscular Given 11/24/19 0827)    ED Course  I have reviewed the triage vital signs and the nursing notes.  Pertinent labs & imaging results that were available during my care of the patient were reviewed by me and considered in my medical decision making (see chart for details).    MDM Rules/Calculators/A&P                         64 year old lady presents to ER with dental pain.  She has extensive dental caries, some focal tenderness to palpation over her right front teeth.  There is a very mild amount of swelling at present but there is no generalized cellulitis or evidence of abscess or induration on exam.  Recommend course of antibiotics and stressed the need for close dentistry follow-up.  Provided dental resources for local dentists.    After the discussed management above, the patient  was determined to be safe for discharge.  The patient was in agreement with this plan and all questions regarding their care  were answered.  ED return precautions were discussed and the patient will return to the ED with any significant worsening of condition.    Final Clinical Impression(s) / ED Diagnoses Final diagnoses:  Pain, dental    Rx / DC Orders ED Discharge Orders         Ordered    penicillin v potassium (VEETID) 500 MG tablet  4 times daily        11/24/19 0825    oxyCODONE-acetaminophen (PERCOCET) 5-325 MG tablet  Every 4 hours PRN        11/24/19 0825           Lucrezia Starch, MD 11/25/19 [phone]

## 2019-11-29 ENCOUNTER — Ambulatory Visit: Payer: Medicaid Other | Admitting: Physician Assistant

## 2019-12-09 ENCOUNTER — Other Ambulatory Visit: Payer: Self-pay | Admitting: Family Medicine

## 2019-12-09 DIAGNOSIS — I10 Essential (primary) hypertension: Secondary | ICD-10-CM

## 2019-12-09 NOTE — Telephone Encounter (Signed)
Requested Prescriptions  Pending Prescriptions Disp Refills  . amLODipine (NORVASC) 5 MG tablet [Pharmacy Med Name: amLODIPine Besylate 5 MG Oral Tablet] 180 tablet 0    Sig: TAKE 1 TABLET BY MOUTH TWICE DAILY TO  LOWER  BLOOD  PRESSURE     Cardiovascular:  Calcium Channel Blockers Passed - 12/09/2019  6:43 PM      Passed - Last BP in normal range    BP Readings from Last 1 Encounters:  11/24/19 133/84         Passed - Valid encounter within last 6 months    Recent Outpatient Visits          4 months ago Chronic obstructive pulmonary disease, unspecified COPD type ([place])   [facility] Fulp, [place], MD   12 months ago Chronic obstructive pulmonary disease, unspecified COPD type ([place])   [facility] Fulp, [place], MD   1 year ago Acute left-sided low back pain without sciatica   [facility] [place]ell [place], MD   1 year ago COPD with acute exacerbation [facility])   [facility] Fulp, [place], MD   1 year ago COPD with acute exacerbation [facility])   [facility] [place], [place], MD

## 2019-12-31 ENCOUNTER — Other Ambulatory Visit: Payer: Self-pay | Admitting: Family Medicine

## 2020-01-03 ENCOUNTER — Encounter: Payer: Self-pay | Admitting: Adult Health

## 2020-01-03 ENCOUNTER — Ambulatory Visit: Payer: Medicaid Other | Admitting: Adult Health

## 2020-01-03 ENCOUNTER — Other Ambulatory Visit: Payer: Self-pay

## 2020-01-03 VITALS — BP 160/70 | HR 106 | Temp 98.8°F | Ht 59.0 in | Wt 169.8 lb

## 2020-01-03 DIAGNOSIS — Z87891 Personal history of nicotine dependence: Secondary | ICD-10-CM | POA: Diagnosis not present

## 2020-01-03 DIAGNOSIS — Z23 Encounter for immunization: Secondary | ICD-10-CM

## 2020-01-03 DIAGNOSIS — J449 Chronic obstructive pulmonary disease, unspecified: Secondary | ICD-10-CM

## 2020-01-03 DIAGNOSIS — J9611 Chronic respiratory failure with hypoxia: Secondary | ICD-10-CM | POA: Diagnosis not present

## 2020-01-03 MED ORDER — SPIRIVA RESPIMAT 2.5 MCG/ACT IN AERS
2.0000 | INHALATION_SPRAY | Freq: Every day | RESPIRATORY_TRACT | 5 refills | Status: DC
Start: 2020-01-03 — End: 2020-08-12

## 2020-01-03 NOTE — Patient Instructions (Addendum)
Continue Dulera 2 puffs Twice daily  , rinse after use.  Add Spiriva 2 puffs daily (call back if not covered  May use Duoneb every 6hr as needed.  Wear Oxygen 2l/m rest and 3l/m with activity . Goal is to keep sats >88-90%)  Activity as tolerated  Refer to LDCT chest screening program.  Flu shot today  Follow up with Dr. Elsworth Soho in 3 -4  months and As needed  with  Please contact office for sooner follow up if symptoms do not improve or worsen or seek emergency care

## 2020-01-03 NOTE — Progress Notes (Signed)
@Patient  ID: Monica Scott, female    DOB: 16-Apr-1955, 64 y.o.   MRN: [id number]  Chief Complaint  Patient presents with  . Follow-up    COPD     Referring provider: [name], MD  HPI: 64 year old female former smoker (heavy smoking history) followed for COPD seen for initial pulmonary consult January 02, 2019 for dyspnea and COPD with oxygen dependence Was started on oxygen July 2019 Medical history significant for mild obstructive sleep apnea declined CPAP Neurogenic bladder with self-catheterization  TEST/EVENTS :  CT chest 04/2018 shows changes of emphysema,mild ectasia of thoracic aorta 3.5 cm  2D echo February 13, 2019 showed a normal EF at 60 to 65%.  N PSG was done on February 13, 2019 that showed mild obstructive sleep apnea with AHI at 7.6 and SPO2 low at 73%.   Alpha-1 antitrypsin-MM, 139  01/03/2020 Follow up: COPD, O2 RF  Patient presents for a 72-month follow-up.  Patient has underlying severe COPD with oxygen dependent respiratory failure.  She was seen last visit.  Pulmonary function testing showed severe airflow obstruction.  FEV1 was 46%.  Patient was given BREZTRI last visit however patient says was not able to afford insurance would not cover this.  And the prescription cost was over $700.  She has returned back to [facility].  She is using DuoNeb once or twice a day.  We discussed possibly adding a Spiriva which she says she has taken before.  She says it was covered by her insurance. Patient remains on oxygen.  She says that she uses it mostly at home.  She does have some small portable tanks but are way too heavy for her to carry.  She has not been approved for POC due to insurance coverage. She is on oxygen 2 L when she is at rest and 3 L with activity.  O2 saturations today at rest were greater than 90%.  However walk test she did drop down into the mid to upper 80s on room air. She denies any increased cough or congestion.  She says her activity levels  at baseline.  Gets winded with mild to moderate activities.  No Known Allergies  Immunization History  Administered Date(s) Administered  . Influenza,inj,Quad PF,6+ Mos 12/06/2017, 11/28/2018, 01/03/2020  . PFIZER SARS-COV-2 Vaccination 06/24/2019, 07/08/2019    Past Medical History:  Diagnosis Date  . Arthritis   . Asthma   . Bladder disease   . COPD (chronic obstructive pulmonary disease) ([place])   . Depression   . GERD (gastroesophageal reflux disease)   . Hyperlipidemia   . Hypertension   . Peptic ulcer     Tobacco History: Social History   Tobacco Use  Smoking Status Former Smoker  . Packs/day: 1.50  . Years: 40.00  . Pack years: 60.00  . Types: Cigarettes  . Quit date: 04/19/2010  . Years since quitting: 9.7  Smokeless Tobacco Never Used   Counseling given: Not Answered   Outpatient Medications Prior to Visit  Medication Sig Dispense Refill  . albuterol (PROVENTIL) (2.5 MG/3ML) 0.083% nebulizer solution Take 3 mLs (2.5 mg total) by nebulization every 6 (six) hours as needed for wheezing or shortness of breath. 360 mL 5  . albuterol (VENTOLIN HFA) 108 (90 Base) MCG/ACT inhaler Inhale 1-2 puffs into the lungs every 6 (six) hours as needed for wheezing or shortness of breath. 6.7 g 6  . amLODipine (NORVASC) 5 MG tablet TAKE 1 TABLET BY MOUTH TWICE DAILY TO  LOWER  BLOOD  PRESSURE 180 tablet 0  . baclofen (LIORESAL) 10 MG tablet TAKE 1/2 TO 1 (ONE-HALF TO ONE) TABLET BY MOUTH THREE TIMES DAILY AS NEEDED FOR MUSCLE SPASM 30 tablet 0  . FLUoxetine (PROZAC) 20 MG tablet Take 20 mg by mouth daily.    Marland Kitchen gabapentin (NEURONTIN) 400 MG capsule Take 1 capsule (400 mg total) by mouth 3 (three) times daily. 90 capsule 11  . ibuprofen (ADVIL,MOTRIN) 200 MG tablet Take 400 mg by mouth 3 (three) times daily as needed for moderate pain.    Marland Kitchen loperamide (IMODIUM A-D) 2 MG tablet Take 4 mg by mouth 2 (two) times daily as needed for diarrhea or loose stools.    . meloxicam (MOBIC) 15 MG  tablet Take 0.5-1 tablets (7.5-15 mg total) by mouth daily as needed for pain. 30 tablet 6  . mometasone-formoterol (DULERA) 100-5 MCG/ACT AERO Inhale 2 puffs into the lungs 2 (two) times daily. 13 g 5  . nabumetone (RELAFEN) 750 MG tablet Take 1 tablet (750 mg total) by mouth 2 (two) times daily as needed. 60 tablet 6  . OLANZapine (ZYPREXA) 5 MG tablet Take 10 mg by mouth at bedtime.     Marland Kitchen olmesartan (BENICAR) 40 MG tablet Take 1 tablet by mouth once daily 30 tablet 3  . omeprazole (PRILOSEC) 40 MG capsule TAKE 1 CAPSULE (40 MG TOTAL) BY MOUTH 2 (TWO) TIMES DAILY. TO REDUCE STOMACH ACID 60 capsule 2  . sertraline (ZOLOFT) 50 MG tablet Take 1 tablet (50 mg total) by mouth daily. 30 tablet 3  . Budeson-Glycopyrrol-Formoterol (BREZTRI AEROSPHERE) 160-9-4.8 MCG/ACT AERO Inhale 2 puffs into the lungs in the morning and at bedtime. (Patient not taking: Reported on 01/03/2020) 10.7 g 5   No facility-administered medications prior to visit.     Review of Systems:   Constitutional:   No  weight loss, night sweats,  Fevers, chills,  +fatigue, or  lassitude.  HEENT:   No headaches,  Difficulty swallowing,  Tooth/dental problems, or  Sore throat,                No sneezing, itching, ear ache, nasal congestion, post nasal drip,   CV:  No chest pain,  Orthopnea, PND, swelling in lower extremities, anasarca, dizziness, palpitations, syncope.   GI  No heartburn, indigestion, abdominal pain, nausea, vomiting, diarrhea, change in bowel habits, loss of appetite, bloody stools.   Resp:    No chest wall deformity  Skin: no rash or lesions.  GU: no dysuria, change in color of urine, no urgency or frequency.  No flank pain, no hematuria   MS:  No joint pain or swelling.  No decreased range of motion.  No back pain.    Physical Exam  BP (!) 160/70 (BP Location: Left Arm, Patient Position: Sitting, Cuff Size: Normal)   Pulse (!) 106   Temp 98.8 F (37.1 C) (Temporal)   Ht 4\' 11"  (1.499 m)   Wt 169  lb 12.8 oz (77 kg)   SpO2 94%   BMI 34.30 kg/m   GEN: A/Ox3; pleasant , NAD, well nourished , BMI 34   HEENT:  [place]/AT,    NOSE-clear, THROAT-clear, no lesions, no postnasal drip or exudate noted.   NECK:  Supple w/ fair ROM; no JVD; normal carotid impulses w/o bruits; no thyromegaly or nodules palpated; no lymphadenopathy.    RESP  Clear  P & A; w/o, wheezes/ rales/ or rhonchi. no accessory muscle use, no dullness to percussion  CARD:  RRR, no  m/r/g, no peripheral edema, pulses intact, no cyanosis or clubbing.  GI:   Soft & nt; nml bowel sounds; no organomegaly or masses detected.   Musco: Warm bil, no deformities or joint swelling noted.   Neuro: alert, no focal deficits noted.    Skin: Warm, no lesions or rashes    Lab Results:  CBC  BMET   Imaging: No results found.    PFT Results Latest Ref Rng & Units 10/03/2019  FVC-Pre L 1.83  FVC-Predicted Pre % 68  FVC-Post L 1.83  FVC-Predicted Post % 68  Pre FEV1/FVC % % 51  Post FEV1/FCV % % 53  FEV1-Pre L 0.94  FEV1-Predicted Pre % 46  FEV1-Post L 0.97  DLCO uncorrected ml/min/mmHg 12.48  DLCO UNC% % 73  DLCO corrected ml/min/mmHg 12.48  DLCO COR %Predicted % 73  DLVA Predicted % 82  TLC L 6.05  TLC % Predicted % 138  RV % Predicted % 215    No results found for: NITRICOXIDE      Assessment & Plan:   COPD (chronic obstructive pulmonary disease) (HCC) Severe COPD currently at baseline.  Would like for her to be on triple therapy.  We will add in Spiriva see if is covered by insurance.  If able to get Spiriva will change her DuoNeb to albuterol. Refer to low-dose CT screening program.  Plan  Patient Instructions  Continue Dulera 2 puffs Twice daily  , rinse after use.  Add Spiriva 2 puffs daily (call back if not covered  May use Duoneb every 6hr as needed.  Wear Oxygen 2l/m rest and 3l/m with activity . Goal is to keep sats >88-90%)  Activity as tolerated  Refer to LDCT chest screening program.  Flu  shot today  Follow up with Dr. Elsworth Soho in 3 -4  months and As needed  with  Please contact office for sooner follow up if symptoms do not improve or worsen or seek emergency care          Chronic respiratory failure with hypoxia ([place]) Continue on oxygen 2 L at rest and 3 L with activity Goal is to have O2 saturations greater than 88 to 90%  Plan  Patient Instructions  Continue Dulera 2 puffs Twice daily  , rinse after use.  Add Spiriva 2 puffs daily (call back if not covered  May use Duoneb every 6hr as needed.  Wear Oxygen 2l/m rest and 3l/m with activity . Goal is to keep sats >88-90%)  Activity as tolerated  Refer to LDCT chest screening program.  Flu shot today  Follow up with Dr. Elsworth Soho in 3 -4  months and As needed  with  Please contact office for sooner follow up if symptoms do not improve or worsen or seek emergency care            Rexene Edison, NP 01/03/2020

## 2020-01-03 NOTE — Assessment & Plan Note (Signed)
Severe COPD currently at baseline.  Would like for her to be on triple therapy.  We will add in Spiriva see if is covered by insurance.  If able to get Spiriva will change her DuoNeb to albuterol. Refer to low-dose CT screening program.  Plan  Patient Instructions  Continue Dulera 2 puffs Twice daily  , rinse after use.  Add Spiriva 2 puffs daily (call back if not covered  May use Duoneb every 6hr as needed.  Wear Oxygen 2l/m rest and 3l/m with activity . Goal is to keep sats >88-90%)  Activity as tolerated  Refer to LDCT chest screening program.  Flu shot today  Follow up with Dr. Elsworth Soho in 3 -4  months and As needed  with  Please contact office for sooner follow up if symptoms do not improve or worsen or seek emergency care

## 2020-01-03 NOTE — Assessment & Plan Note (Signed)
Continue on oxygen 2 L at rest and 3 L with activity Goal is to have O2 saturations greater than 88 to 90%  Plan  Patient Instructions  Continue Dulera 2 puffs Twice daily  , rinse after use.  Add Spiriva 2 puffs daily (call back if not covered  May use Duoneb every 6hr as needed.  Wear Oxygen 2l/m rest and 3l/m with activity . Goal is to keep sats >88-90%)  Activity as tolerated  Refer to LDCT chest screening program.  Flu shot today  Follow up with Dr. Elsworth Soho in 3 -4  months and As needed  with  Please contact office for sooner follow up if symptoms do not improve or worsen or seek emergency care

## 2020-01-07 MED FILL — OMEPRAZOLE DR 40 MG CAPSULE: 40 | 30 days supply | Qty: 60 | Fill #1

## 2020-01-14 ENCOUNTER — Ambulatory Visit: Payer: Medicaid Other | Attending: Family Medicine | Admitting: Family Medicine

## 2020-01-14 ENCOUNTER — Encounter: Payer: Self-pay | Admitting: Family Medicine

## 2020-01-14 ENCOUNTER — Other Ambulatory Visit: Payer: Self-pay

## 2020-01-14 DIAGNOSIS — R1310 Dysphagia, unspecified: Secondary | ICD-10-CM | POA: Diagnosis not present

## 2020-01-14 DIAGNOSIS — Z8719 Personal history of other diseases of the digestive system: Secondary | ICD-10-CM

## 2020-01-14 DIAGNOSIS — R197 Diarrhea, unspecified: Secondary | ICD-10-CM

## 2020-01-14 DIAGNOSIS — K635 Polyp of colon: Secondary | ICD-10-CM

## 2020-01-14 DIAGNOSIS — K219 Gastro-esophageal reflux disease without esophagitis: Secondary | ICD-10-CM

## 2020-01-14 NOTE — Progress Notes (Signed)
Virtual Visit via Telephone Note  I connected with Monica Scott on 01/14/20 at 5:08 pm by telephone and verified that I am speaking with the correct person using two identifiers.   I discussed the limitations, risks, security and privacy concerns of performing an evaluation and management service by telephone and the availability of in person appointments. I also discussed with the patient that there may be a patient responsible charge related to this service. The patient expressed understanding and agreed to proceed.  Patient Location: Home Provider Location: [facility] office Others participating in call: none   History of Present Illness:      64 year old female with complaint of recurrent episodes of diarrhea, sometimes without awareness which has led to fecal soiling of her clothing.  She has been taking over-the-counter Imodium which does help to slow down the diarrhea.  She denies any blood in the stool/fecal matter and no black looking stool/diarrhea.  She denies any current abdominal pain.  She does report a history of diverticulitis.  She also reports that when she swallows food, she has a sensation that food gets stuck just above the level of the stomach and then takes a while before moving down.  She does not have any difficulty swallowing water and no episodes of coughing after swallowing water or other liquids.  She tends to notice the most issues with swallowing of rice or pasta.  She does have a history of gastroesophageal reflux but reports that she does not take her omeprazole on a daily basis but tends to take it as needed.  She does have a current prescription for omeprazole 40 mg twice daily.  She recalled that she had a colonoscopy done at [facility] in [place] in January 2020 with removal of polyps due to family history of colon cancer however patient was not aware until it was mentioned that she had also had an upper endoscopy.  Patient reports that she was supposed to  return for follow-up visit with gastroenterology in 2020 but states that she never received the date for the follow-up appointment and therefore never had follow-up of her acid reflux, difficulty swallowing, colon polyps and history of diverticulitis.   Past Medical History:  Diagnosis Date  . Arthritis   . Asthma   . Bladder disease   . COPD (chronic obstructive pulmonary disease) ([place])   . Depression   . GERD (gastroesophageal reflux disease)   . Hyperlipidemia   . Hypertension   . Peptic ulcer     Past Surgical History:  Procedure Laterality Date  . ABDOMINAL HYSTERECTOMY    . BIOPSY  04/24/2018   Procedure: BIOPSY;  Surgeon: Daneil Dolin, MD;  [facility];  Service: Endoscopy;;  esophagus  . COLONOSCOPY WITH PROPOFOL N/A 04/24/2018   Procedure: COLONOSCOPY WITH PROPOFOL;  Surgeon: Daneil Dolin, MD;  [facility];  Service: Endoscopy;  Laterality: N/A;  9:00am  . ESOPHAGOGASTRODUODENOSCOPY (EGD) WITH PROPOFOL N/A 04/24/2018   Procedure: ESOPHAGOGASTRODUODENOSCOPY (EGD) WITH PROPOFOL;  Surgeon: Daneil Dolin, MD;  [facility];  Service: Endoscopy;  Laterality: N/A;  Venia Minks DILATION N/A 04/24/2018   Procedure: Venia Minks DILATION;  Surgeon: Daneil Dolin, MD;  [facility];  Service: Endoscopy;  Laterality: N/A;  . PILONIDAL CYST EXCISION     age 18  . POLYPECTOMY  04/24/2018   Procedure: POLYPECTOMY;  Surgeon: Daneil Dolin, MD;  [facility];  Service: Endoscopy;;  colon  Family History  Problem Relation Age of Onset  . Alcohol abuse Mother   . Arthritis Mother   . Cancer Mother   . Depression Mother   . Early death Mother   . Alcohol abuse Father   . Depression Father   . Alcohol abuse Sister   . Depression Sister   . Alcohol abuse Brother   . Alcohol abuse Daughter   . Depression Daughter   . Alcohol abuse Son   . COPD Maternal Grandfather   . Heart disease Maternal Grandfather   . Hypertension  Maternal Grandfather   . Colon cancer Brother        deceased at age 54    Social History   Tobacco Use  . Smoking status: Former Smoker    Packs/day: 1.50    Years: 40.00    Pack years: 60.00    Types: Cigarettes    Quit date: 04/19/2010    Years since quitting: 9.7  . Smokeless tobacco: Never Used  Vaping Use  . Vaping Use: Never used  Substance Use Topics  . Alcohol use: No    Comment: Former ETOH. none since 2000  . Drug use: No     No Known Allergies     Observations/Objective: No vital signs or physical exam conducted as visit was done via telephone  Assessment and Plan: 1. Dysphagia, unspecified type 2. History of esophagitis; 4.  Gastroesophageal reflux disease, presence of esophagitis unspecified On review of chart, patient had admission to [facility] on 04/24/2018 for EGD and colonoscopy in follow-up of dysphagia, diarrhea and family history of colon cancer.  EGD results were reviewed with the patient who was not aware that she had had this procedure done at that time.  Patient did have presence of esophagitis as well as the need for dilation of the esophagus.  She reports that she is not currently taking her omeprazole on a daily basis.  She reports that her issues with diarrhea started prior to her use of medication for the treatment of acid reflux.  She has been asked to restart the use of her omeprazole 40 mg twice daily and she has been referred back to gastroenterology for further evaluation and treatment of dysphagia, history of esophagitis and GERD.  3. Diarrhea, unspecified type 5.  Multiple polyps of sigmoid colon Patient had removal of 6 sessile polyps during her colonoscopy on 04/24/2018 as well as findings of diverticula without presence of diverticulitis.  She denies any current abdominal pain or blood in the stool.  She does however continue to have chronic issues with diarrhea which is improved at times with the use of Imodium.  She will be referred  back to gastroenterology for further evaluation and treatment.  She is encouraged to remain well-hydrated.  Follow Up Instructions:Return for Chronic issues-as needed and/ or keep scheduled follow-up.   I discussed the assessment and treatment plan with the patient. The patient was provided an opportunity to ask questions and all were answered. The patient agreed with the plan and demonstrated an understanding of the instructions.   The patient was advised to call back or seek an in-person evaluation if the symptoms worsen or if the condition fails to improve as anticipated.  I provided 10 minutes of non-face-to-face time during this encounter.   [name], MD

## 2020-01-24 ENCOUNTER — Other Ambulatory Visit: Payer: Self-pay | Admitting: Pulmonary Disease

## 2020-01-24 DIAGNOSIS — J449 Chronic obstructive pulmonary disease, unspecified: Secondary | ICD-10-CM

## 2020-01-31 ENCOUNTER — Telehealth: Payer: Self-pay | Admitting: Adult Health

## 2020-02-01 MED ORDER — OLMESARTAN MEDOXOMIL 40 MG PO TABS: 40.0000 mg | ORAL_TABLET | Freq: Every day | ORAL | 3 refills | Status: DC

## 2020-02-01 NOTE — Telephone Encounter (Signed)
ATC patient to let her know that RX was being sent into preferred pharmacy. Left message. Nothing further needed at this time.

## 2020-03-04 ENCOUNTER — Telehealth: Payer: Self-pay | Admitting: Pulmonary Disease

## 2020-03-04 MED ORDER — AZITHROMYCIN 250 MG PO TABS
ORAL_TABLET | ORAL | 0 refills | Status: DC
Start: 2020-03-04 — End: 2020-04-01

## 2020-03-04 MED ORDER — PREDNISONE 10 MG PO TABS
ORAL_TABLET | ORAL | 0 refills | Status: DC
Start: 2020-03-04 — End: 2020-04-01

## 2020-03-04 NOTE — Telephone Encounter (Signed)
Spoke with pt, c/o increased sob with exertion, nonprod cough, sinus congestion, pnd X2-3 weeks.   Denies fever, chest pain/tightness.  States she feels her inhalers aren't working as well as they used to.   Pt has been wearing O2 with exertion 3lpm, using maintenance meds, albuterol inhaler and neb 2-3X daily.  Requesting additional recs.  Pharmacy: [facility]   Dr. Elsworth Soho please advise on recs.  Thanks

## 2020-03-04 NOTE — Telephone Encounter (Signed)
Prednisone 10 mg tabs  Take 2 tabs daily with food x 5ds, then 1 tab daily with food x 5ds then STOP  Zpak  

## 2020-03-04 NOTE — Telephone Encounter (Signed)
Pt aware of recs.  rx's sent to preferred pharmacy.  Nothing further needed at this time- will close encounter.

## 2020-03-06 ENCOUNTER — Encounter: Payer: Self-pay | Admitting: Acute Care

## 2020-03-06 ENCOUNTER — Other Ambulatory Visit: Payer: Medicaid Other | Admitting: Acute Care

## 2020-03-06 ENCOUNTER — Other Ambulatory Visit: Payer: Self-pay | Admitting: *Deleted

## 2020-03-06 DIAGNOSIS — Z87891 Personal history of nicotine dependence: Secondary | ICD-10-CM

## 2020-03-06 NOTE — Progress Notes (Signed)
Shared Decision-Making Visit for Lung Cancer Screening [phone] )  This was a tele visit Pt. Was at home. Provider was at [facility], [place], [place], [address] [employer].  Pt. Agreed to tele visit format. 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 No Recent History of coughing up blood   No Unexplained weight loss of > 15 pounds in the last 6 months. No Prior History Lung / other cancer  (Diagnosis within the last 5 years already requiring surveillance chest CT Scans). Pt is a current smoker, or former smoker who has quit within the last 15 years.>> Former smoker quit 2011  This patient meets the criterial noted above and is asymptomatic for any signs or symptoms of lung cancer.  The Shared Decision-Making Visit discussion included risks and benefits of screening, potential for follow up diagnostic testing for abnormal scans, potential for false positive tests, over diagnosis, and discussion about total radiation exposure. Patient stated willingness to undergo diagnostics and treatment as needed. Current smokers were counseled on smoking cessation as the single most powerful action they can take to decrease their risk of lung cancer, pulmonary disease, heart disease and stroke. They were given a resource card with information on receiving free nicotine replacement therapy , and information about free smoking cessation classes.  Pt understands that this scan is being paid for by a grant obtained by the [facility]. We discussed  that there is no guarantee of additional grant money from year to year as these grants are not guaranteed to programs on an annual basis.They have to be applied for and they are awarded based on county need, and utilization of the previous years funds..   Pt understands Langley Gauss will mail her a Ticket to Ride  the scanner and a [employer]) for the scan. She understands she will get a call to schedule the scan, and that we will call her with the results. She verbalized understanding of the above and had no further questions.   Magdalen Spatz, MSN, AGACNP-BC [facility] for personal pager PCCM on call pager [phone]  03/06/2020 9:12 AM

## 2020-03-20 ENCOUNTER — Telehealth: Payer: Self-pay | Admitting: Adult Health

## 2020-03-20 DIAGNOSIS — J449 Chronic obstructive pulmonary disease, unspecified: Secondary | ICD-10-CM

## 2020-03-20 MED ORDER — MOMETASONE FURO-FORMOTEROL FUM 100-5 MCG/ACT IN AERO
2.0000 | INHALATION_SPRAY | Freq: Two times a day (BID) | RESPIRATORY_TRACT | 11 refills | Status: DC
Start: 2020-03-20 — End: 2020-11-19

## 2020-03-20 NOTE — Telephone Encounter (Signed)
Request Dulera refill to [facility]

## 2020-03-26 ENCOUNTER — Ambulatory Visit
Admission: RE | Admit: 2020-03-26 | Discharge: 2020-03-26 | Disposition: A | Discharge status: Home / Self Care | Payer: No Typology Code available for payment source | Source: Ambulatory Visit | Attending: Acute Care | Admitting: Acute Care

## 2020-03-26 ENCOUNTER — Other Ambulatory Visit: Payer: Self-pay | Admitting: Family Medicine

## 2020-03-26 DIAGNOSIS — Z87891 Personal history of nicotine dependence: Secondary | ICD-10-CM

## 2020-03-26 MED FILL — OMEPRAZOLE DR 40 MG CAPSULE: 40 | 30 days supply | Qty: 60 | Fill #2

## 2020-03-31 ENCOUNTER — Ambulatory Visit: Payer: Medicaid Other | Admitting: Adult Health

## 2020-04-01 ENCOUNTER — Ambulatory Visit (INDEPENDENT_AMBULATORY_CARE_PROVIDER_SITE_OTHER): Payer: Medicaid Other | Admitting: Family Medicine

## 2020-04-01 ENCOUNTER — Encounter: Payer: Self-pay | Admitting: Family Medicine

## 2020-04-01 ENCOUNTER — Other Ambulatory Visit: Payer: Self-pay

## 2020-04-01 DIAGNOSIS — G8929 Other chronic pain: Secondary | ICD-10-CM

## 2020-04-01 DIAGNOSIS — M545 Low back pain, unspecified: Secondary | ICD-10-CM | POA: Diagnosis not present

## 2020-04-01 MED ORDER — CELECOXIB 200 MG PO CAPS: 200.0000 mg | ORAL_CAPSULE | Freq: Two times a day (BID) | ORAL | 6 refills | Status: DC | PRN

## 2020-04-01 MED ORDER — TIZANIDINE HCL 2 MG PO TABS: 2.0000 mg | ORAL_TABLET | Freq: Four times a day (QID) | ORAL | 1 refills | Status: DC | PRN

## 2020-04-01 NOTE — Progress Notes (Signed)
Office Visit Note   Patient: Monica Scott           Date of Birth: 05-Jan-1956           MRN: [id number] Visit Date: 04/01/2020 Requested by: No referring provider defined for this encounter. PCP: Cain Saupe, MD (Inactive)  Subjective: Chief Complaint  Patient presents with  . Lower Back - Pain    Pain across lower back and into left buttock x 1 week. NKI. No n/t in the leg. Pain does not radiate down either leg. No relief with baclofen and meloxicam.    HPI: She is here with worsening low back pain.  When I saw her last winter I gave her meloxicam and baclofen.  She states that those things helped, but the pain never went away completely.  It has gotten worse in the past week with pain in the left buttocks and radiating into the left leg.  Denies bowel or bladder dysfunction.               ROS:   All other systems were reviewed and are negative.  Objective: Vital Signs: There were no vitals taken for this visit.  Physical Exam:  General:  Alert and oriented, in no acute distress. Pulm:  Breathing unlabored. Psy:  Normal mood, congruent affect. Skin: No visible rash Low back: She has some tenderness over the left SI joint and in the left sciatic notch.  Negative straight leg raise, no pain with internal hip rotation.  Lower extremity strength and reflexes remain normal.  Imaging: No results found.  Assessment & Plan: 1.  Flareup chronic low back pain with left-sided radicular symptoms, cannot rule out SI joint dysfunction. -We discussed options, she is not interested in physical therapy, chiropractic, or injection.  We will instead try different medication including Celebrex and Zanaflex as needed. -She will notify me if symptoms persist or worsen.     Procedures: No procedures performed        PMFS History: Patient Active Problem List   Diagnosis Date Noted  . Dyspnea 02/05/2019  . Chronic respiratory failure with hypoxia (HCC) 02/05/2019  . Daytime  sleepiness 02/05/2019  . Pelvic relaxation due to cystocele, midline 01/16/2019  . Emphysema of lung (HCC) 06/14/2018  . Aortic atherosclerosis (HCC) 06/14/2018  . Atherosclerosis of coronary artery 06/14/2018  . Thoracic aortic ectasia (HCC) 05/19/2018  . Esophageal dysphagia 03/01/2018  . Family history of colon cancer 03/01/2018  . Diarrhea 03/01/2018  . Near syncope 10/17/2017  . Hyperlipidemia 10/17/2017  . GERD (gastroesophageal reflux disease) 10/17/2017  . Bladder disease or syndrome 10/17/2017  . Depression 10/17/2017  . Hypertension, essential, benign 02/19/2017  . COPD (chronic obstructive pulmonary disease) (HCC) 02/11/2017  . Chest pain 02/12/2015  . Referral of patient 02/12/2015   Past Medical History:  Diagnosis Date  . Arthritis   . Asthma   . Bladder disease   . COPD (chronic obstructive pulmonary disease) (HCC)   . Depression   . GERD (gastroesophageal reflux disease)   . Hyperlipidemia   . Hypertension   . Peptic ulcer     Family History  Problem Relation Age of Onset  . Alcohol abuse Mother   . Arthritis Mother   . Cancer Mother   . Depression Mother   . Early death Mother   . Alcohol abuse Father   . Depression Father   . Alcohol abuse Sister   . Depression Sister   . Alcohol abuse Brother   .  Alcohol abuse Daughter   . Depression Daughter   . Alcohol abuse Son   . COPD Maternal Grandfather   . Heart disease Maternal Grandfather   . Hypertension Maternal Grandfather   . Colon cancer Brother        deceased at age 46    Past Surgical History:  Procedure Laterality Date  . ABDOMINAL HYSTERECTOMY    . BIOPSY  04/24/2018   Procedure: BIOPSY;  Surgeon: Daneil Dolin, MD;  [facility];  Service: Endoscopy;;  esophagus  . COLONOSCOPY WITH PROPOFOL N/A 04/24/2018   Procedure: COLONOSCOPY WITH PROPOFOL;  Surgeon: Daneil Dolin, MD;  [facility];  Service: Endoscopy;  Laterality: N/A;  9:00am  .  ESOPHAGOGASTRODUODENOSCOPY (EGD) WITH PROPOFOL N/A 04/24/2018   Procedure: ESOPHAGOGASTRODUODENOSCOPY (EGD) WITH PROPOFOL;  Surgeon: Daneil Dolin, MD;  [facility];  Service: Endoscopy;  Laterality: N/A;  Venia Minks DILATION N/A 04/24/2018   Procedure: Venia Minks DILATION;  Surgeon: Daneil Dolin, MD;  [facility];  Service: Endoscopy;  Laterality: N/A;  . PILONIDAL CYST EXCISION     age 29  . POLYPECTOMY  04/24/2018   Procedure: POLYPECTOMY;  Surgeon: Daneil Dolin, MD;  [facility];  Service: Endoscopy;;  colon    Social History   Occupational History  . Not on file  Tobacco Use  . Smoking status: Former Smoker    Packs/day: 1.50    Years: 40.00    Pack years: 60.00    Types: Cigarettes    Quit date: 04/19/2010    Years since quitting: 9.9  . Smokeless tobacco: Never Used  Vaping Use  . Vaping Use: Never used  Substance and Sexual Activity  . Alcohol use: No    Comment: Former ETOH. none since 2000  . Drug use: No  . Sexual activity: Yes    Birth control/protection: Surgical

## 2020-04-07 ENCOUNTER — Encounter: Payer: Self-pay | Admitting: Adult Health

## 2020-04-07 ENCOUNTER — Other Ambulatory Visit: Payer: Self-pay

## 2020-04-07 ENCOUNTER — Ambulatory Visit: Payer: Medicaid Other | Admitting: Adult Health

## 2020-04-07 DIAGNOSIS — J9611 Chronic respiratory failure with hypoxia: Secondary | ICD-10-CM | POA: Diagnosis not present

## 2020-04-07 DIAGNOSIS — J449 Chronic obstructive pulmonary disease, unspecified: Secondary | ICD-10-CM

## 2020-04-07 DIAGNOSIS — R5381 Other malaise: Secondary | ICD-10-CM | POA: Insufficient documentation

## 2020-04-07 NOTE — Patient Instructions (Addendum)
Continue Dulera 2 puffs Twice daily  , rinse after use.  Restart Spiriva  daily when available .  Duoneb As needed   Refer to pulmonary rehab .    Wear Oxygen 2l/m rest and 3l/m with activity . Goal is to keep sats >88-90%)  On POC will need 4l/m with activity (pulsing oxygen )  Activity as tolerated  Follow up with Dr. Elsworth Soho in 3 -4  months and As needed  with  Please contact office for sooner follow up if symptoms do not improve or worsen or seek emergency care

## 2020-04-07 NOTE — Assessment & Plan Note (Signed)
Severe COPD with physical deconditioning.  Refer to pulmonary rehab.  FEV1 46%.

## 2020-04-07 NOTE — Progress Notes (Signed)
@Patient  ID: Monica Scott, female    DOB: [birth year], 65 y.o.   MRN: [id number]  Chief Complaint  Patient presents with  . 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  N PSG was done on February 13, 2019 that showed mild obstructive sleep apnea with AHI at 7.6 and SPO2 low at 73%.   Alpha-1 antitrypsin-MM, 139  09/2019 FEV1 was 46%, ratio 53, FVC 68%, no significant bronchodilator response, DLCO 73%   04/07/2020 Follow up: COPD , O2 RF  Patient presents for 7-month follow-up.  Patient has underlying severe COPD that is oxygen dependent.  Last visit patient was changed to [facility] and Spiriva due to insurance changes..  She also has DuoNeb nebulizer that she can use as needed.  Patient says her Spiriva inhaler broke.  She is unable to get this filled until another couple weeks.  She is using DuoNeb 3 times a day until she can get her Spiriva filled patient says she still gets short of breath with minimal activity.  Has low activity tolerance. She was referred to the CT chest lung cancer screening program.  CT was done March 27, 2020 that showed emphysematous changes stable scarring.  In a lung RADS 1 with plan to follow-up CT in 12 months. She denies any flare of cough or wheezing.  We discussed a referral to pulmonary rehab to help with her dyspnea and activity intolerance.  She remains on oxygen 2 L at rest and 3 L with activity.  She does have a portable oxygen concentrator.  Today walk test in the office showed  O2 saturations at 88% on room air.  She required 4 L on portable oxygen concentrator to maintain O2 saturations greater than 88%. Portable oxygen concentrator helps her to be more mobile active and independent. Patient denies any chest pain orthopnea PND or increased leg swelling.   No Known Allergies  Immunization History  Administered Date(s) Administered  . Influenza,inj,Quad PF,6+ Mos 12/06/2017, 11/28/2018, 01/03/2020  . PFIZER SARS-COV-2 Vaccination 06/24/2019, 07/08/2019, 03/07/2020    Past Medical History:  Diagnosis Date  . Arthritis   . Asthma   . Bladder disease   . COPD (chronic obstructive pulmonary disease) ([place])   . Depression   . GERD (gastroesophageal reflux disease)   . Hyperlipidemia   . Hypertension   . Peptic ulcer     Tobacco History: Social History   Tobacco Use  Smoking Status Former Smoker  . Packs/day: 1.50  . Years: 40.00  . Pack years: 60.00  . Types: Cigarettes  . Quit date: 04/19/2010  . Years since quitting: 9.9  Smokeless Tobacco Never Used   Counseling given: Not Answered   Outpatient Medications Prior to Visit  Medication Sig Dispense Refill  . albuterol (PROVENTIL) (2.5 MG/3ML) 0.083% nebulizer solution Take 3 mLs (2.5 mg total) by nebulization every 6 (six) hours as needed for wheezing or shortness of breath. 360 mL 5  . albuterol (VENTOLIN HFA) 108 (90 Base) MCG/ACT inhaler Inhale 1-2 puffs into the lungs every 6 (six) hours as needed  for wheezing or shortness of breath. 6.7 g 6  . amLODipine (NORVASC) 5 MG tablet TAKE 1 TABLET BY MOUTH TWICE DAILY TO  LOWER  BLOOD  PRESSURE 180 tablet 0  . buPROPion (WELLBUTRIN XL) 150 MG 24 hr tablet Take 150 mg by mouth at bedtime.    . celecoxib (CELEBREX) 200 MG capsule Take 1 capsule (200 mg total) by mouth 2 (two) times daily as needed. 60 capsule 6  . DULERA 100-5 MCG/ACT AERO Inhale 2 puffs by mouth twice daily 13 g 11  . gabapentin (NEURONTIN) 400 MG capsule Take 1 capsule (400 mg total)  by mouth 3 (three) times daily. 90 capsule 11  . ibuprofen (ADVIL,MOTRIN) 200 MG tablet Take 400 mg by mouth 3 (three) times daily as needed for moderate pain.    Marland Kitchen loperamide (IMODIUM A-D) 2 MG tablet Take 4 mg by mouth 2 (two) times daily as needed for diarrhea or loose stools.    Marland Kitchen OLANZapine (ZYPREXA) 5 MG tablet Take 10 mg by mouth at bedtime.     Marland Kitchen olmesartan (BENICAR) 40 MG tablet Take 1 tablet (40 mg total) by mouth daily. 30 tablet 3  . omeprazole (PRILOSEC) 40 MG capsule TAKE 1 CAPSULE (40 MG TOTAL) BY MOUTH 2 (TWO) TIMES DAILY. TO REDUCE STOMACH ACID 60 capsule 2  . tiZANidine (ZANAFLEX) 2 MG tablet Take 1-2 tablets (2-4 mg total) by mouth every 6 (six) hours as needed for muscle spasms. 60 tablet 1  . mometasone-formoterol (DULERA) 100-5 MCG/ACT AERO Inhale 2 puffs into the lungs in the morning and at bedtime. (Patient not taking: Reported on 04/07/2020) 1 each 11  . Tiotropium Bromide Monohydrate (SPIRIVA RESPIMAT) 2.5 MCG/ACT AERS Inhale 2 puffs into the lungs daily. (Patient not taking: Reported on 04/07/2020) 1 g 5   No facility-administered medications prior to visit.     Review of Systems:   Constitutional:   No  weight loss, night sweats,  Fevers, chills,  +fatigue, or  lassitude.  HEENT:   No headaches,  Difficulty swallowing,  Tooth/dental problems, or  Sore throat,                No sneezing, itching, ear ache, nasal congestion, post nasal drip,   CV:  No chest pain,  Orthopnea, PND, swelling in lower extremities, anasarca, dizziness, palpitations, syncope.   GI  No heartburn, indigestion, abdominal pain, nausea, vomiting, diarrhea, change in bowel habits, loss of appetite, bloody stools.   Resp:   No chest wall deformity  Skin: no rash or lesions.  GU: no dysuria, change in color of urine, no urgency or frequency.  No flank pain, no hematuria   MS:  No joint pain or swelling.  No decreased range of motion.  No back pain.    Physical Exam  BP 132/76 (BP  Location: Left Arm, Cuff Size: Normal)   Pulse 95   Temp 97.6 F (36.4 C)   Ht 4\' 11"  (1.499 m)   Wt 173 lb 6.4 oz (78.7 kg)   SpO2 95%   BMI 35.02 kg/m   GEN: A/Ox3; pleasant , NAD, well nourished    HEENT:  [place]/AT,   , NOSE-clear, THROAT-clear, no lesions, no postnasal drip or exudate noted.   NECK:  Supple w/ fair ROM; no JVD; normal carotid impulses w/o bruits; no thyromegaly or nodules palpated; no lymphadenopathy.    RESP  Clear  P & A; w/o, wheezes/ rales/ or rhonchi. no accessory muscle use, no dullness to percussion  CARD:  RRR, no m/r/g, tr  peripheral edema, pulses intact, no cyanosis or clubbing.  GI:   Soft & nt; nml bowel sounds; no organomegaly or masses detected.   Musco: Warm bil, no deformities or joint swelling noted.   Neuro: alert, no focal deficits noted.    Skin: Warm, no lesions or rashes    Lab Results:    BNP  Imaging: CT CHEST LUNG CA SCREEN LOW DOSE W/O CM  Result Date: 03/27/2020 CLINICAL DATA:  65 year old female former smoker, quit 10 years ago, with 37 pack-year history of smoking, for initial lung cancer screening EXAM: CT CHEST WITHOUT CONTRAST LOW-DOSE FOR LUNG CANCER SCREENING TECHNIQUE: Multidetector CT imaging of the chest was performed following the standard protocol without IV contrast. COMPARISON:  CT chest dated 05/18/2018 FINDINGS: Cardiovascular: Heart is normal in size.  No pericardial effusion. No evidence of thoracic aortic aneurysm. Atherosclerotic calcifications of the aortic arch. Mild coronary atherosclerosis the LAD. Mediastinum/Nodes: No suspicious mediastinal lymphadenopathy. Visualized thyroid is unremarkable. Lungs/Pleura: Mild centrilobular and paraseptal emphysematous changes, upper lung predominant. Scattered areas of scarring/atelectasis in the lungs bilaterally, unchanged. No focal consolidation. No suspicious pulmonary nodules. No pleural effusion or pneumothorax. Upper Abdomen: Visualized upper abdomen is grossly  unremarkable, noting vascular calcifications. Musculoskeletal: Visualized osseous structures are within normal limits. IMPRESSION: Lung-RADS 1, negative. Continue annual screening with low-dose chest CT without contrast in 12 months. Aortic Atherosclerosis (ICD10-I70.0) and Emphysema (ICD10-J43.9). Electronically Signed   By: Julian Hy M.D.   On: 03/27/2020 08:13      PFT Results Latest Ref Rng & Units 10/03/2019  FVC-Pre L 1.83  FVC-Predicted Pre % 68  FVC-Post L 1.83  FVC-Predicted Post % 68  Pre FEV1/FVC % % 51  Post FEV1/FCV % % 53  FEV1-Pre L 0.94  FEV1-Predicted Pre % 46  FEV1-Post L 0.97  DLCO uncorrected ml/min/mmHg 12.48  DLCO UNC% % 73  DLCO corrected ml/min/mmHg 12.48  DLCO COR %Predicted % 73  DLVA Predicted % 82  TLC L 6.05  TLC % Predicted % 138  RV % Predicted % 215    No results found for: NITRICOXIDE      Assessment & Plan:   COPD (chronic obstructive pulmonary disease) (HCC) Severe COPD with high symptom burden.  Patient is on maximum therapy with Dulera and Spiriva.  She has DuoNeb as well.  Does have a component of deconditioning.  She would benefit from pulmonary rehab. Recent CT chest showed stable COPD/emphysema.  2D echo in 2020 showed preserved EF Pulmonary function testing 2021 showed severe COPD with FEV1 at 46%. For now continue on current regimen.  Refer to pulmonary rehab. Reports recent blood work with her primary care was normal  Plan  Patient Instructions  Continue Dulera 2 puffs Twice daily  , rinse after use.  Restart Spiriva  daily when available .  Duoneb As needed   Refer to pulmonary rehab .    Wear Oxygen 2l/m rest and 3l/m with activity . Goal is to keep sats >88-90%)  On POC will need 4l/m with activity (pulsing oxygen )  Activity as tolerated  Follow up with Dr. Elsworth Soho in 3 -4  months and As needed  with  Please contact office for sooner follow up if symptoms do not improve or worsen or seek emergency care       '   Chronic respiratory failure with hypoxia ([place]) Continue on oxygen to maintain O2 saturations greater than 88 to 90%.  On continuous flow oxygen  continue 2 L at rest and 3 L with activity.  On portable oxygen concentrator with pulse demand oxygen setting will need 4 L.  Patient does need a portable oxygen conservator to help her with mobility exercise and independence.  Plan  Patient Instructions  Continue Dulera 2 puffs Twice daily  , rinse after use.  Restart Spiriva  daily when available .  Duoneb As needed   Refer to pulmonary rehab .    Wear Oxygen 2l/m rest and 3l/m with activity . Goal is to keep sats >88-90%)  On POC will need 4l/m with activity (pulsing oxygen )  Activity as tolerated  Follow up with Dr. Elsworth Soho in 3 -4  months and As needed  with  Please contact office for sooner follow up if symptoms do not improve or worsen or seek emergency care         Physical deconditioning Severe COPD with physical deconditioning.  Refer to pulmonary rehab.  FEV1 46%.     Rexene Edison, NP 04/07/2020

## 2020-04-07 NOTE — Assessment & Plan Note (Signed)
Severe COPD with high symptom burden.  Patient is on maximum therapy with Dulera and Spiriva.  She has DuoNeb as well.  Does have a component of deconditioning.  She would benefit from pulmonary rehab. Recent CT chest showed stable COPD/emphysema.  2D echo in 2020 showed preserved EF Pulmonary function testing 2021 showed severe COPD with FEV1 at 46%. For now continue on current regimen.  Refer to pulmonary rehab. Reports recent blood work with her primary care was normal  Plan  Patient Instructions  Continue Dulera 2 puffs Twice daily  , rinse after use.  Restart Spiriva  daily when available .  Duoneb As needed   Refer to pulmonary rehab .    Wear Oxygen 2l/m rest and 3l/m with activity . Goal is to keep sats >88-90%)  On POC will need 4l/m with activity (pulsing oxygen )  Activity as tolerated  Follow up with Dr. Elsworth Soho in 3 -4  months and As needed  with  Please contact office for sooner follow up if symptoms do not improve or worsen or seek emergency care      '

## 2020-04-07 NOTE — Assessment & Plan Note (Signed)
Continue on oxygen to maintain O2 saturations greater than 88 to 90%.  On continuous flow oxygen continue 2 L at rest and 3 L with activity.  On portable oxygen concentrator with pulse demand oxygen setting will need 4 L.  Patient does need a portable oxygen conservator to help her with mobility exercise and independence.  Plan  Patient Instructions  Continue Dulera 2 puffs Twice daily  , rinse after use.  Restart Spiriva  daily when available .  Duoneb As needed   Refer to pulmonary rehab .    Wear Oxygen 2l/m rest and 3l/m with activity . Goal is to keep sats >88-90%)  On POC will need 4l/m with activity (pulsing oxygen )  Activity as tolerated  Follow up with Dr. Elsworth Soho in 3 -4  months and As needed  with  Please contact office for sooner follow up if symptoms do not improve or worsen or seek emergency care

## 2020-04-08 ENCOUNTER — Encounter (HOSPITAL_COMMUNITY): Payer: Self-pay | Admitting: *Deleted

## 2020-04-08 NOTE — Progress Notes (Signed)
Received referral from Dr. Elsworth Soho  for this pt to participate in pulmonary rehab with the the diagnosis of COPD (unspecified). Pt had PFT completed on 7/7 which showed FEV1/FVC 53 and FEV1 post pred 47.   Clinical review of pt follow up appt on 1/10 with Rexene Edison NP with Dr. Elsworth Soho  Pulmonary office note.  Pt with Covid Risk Score - 5.  Noted that pt has Medicaid with [place].  Pulmonary rehab is not a covered by [employer] for [employer].  Will verify that this is the particular plan this pt has.  If covered,  Pt appropriate for scheduling for Pulmonary rehab.  Will forward to support staff for verification of insurance eligibility/benefits and hopeful scheduling with pt consent. Cherre Huger, BSN Cardiac and Training and development officer

## 2020-04-16 ENCOUNTER — Telehealth: Payer: Self-pay | Admitting: Adult Health

## 2020-04-17 NOTE — Telephone Encounter (Signed)
Spoke with the pt  She is okay with [place] speaking with LPT  Called LPT and verified that pt does use o2  They will be shipping her a POC today  OV note faxed [phone]  Nothing further needed

## 2020-04-28 ENCOUNTER — Other Ambulatory Visit: Payer: Self-pay | Admitting: Nurse Practitioner

## 2020-04-28 DIAGNOSIS — I10 Essential (primary) hypertension: Secondary | ICD-10-CM

## 2020-04-28 MED ORDER — AMLODIPINE BESYLATE 5 MG PO TABS: ORAL_TABLET | ORAL | 0 refills | Status: DC

## 2020-04-28 NOTE — Telephone Encounter (Signed)
Medication Refill - Medication: amlodipine 5 mg  Has the patient contacted their pharmacy? Yes. Per pt the pharm has been trying to contact the office. Pt has an appt with zelda fleming on 06-02-2020 Preferred Pharmacy (with phone number or street name):[facility]: Please be advised that RX refills may take up to 3 business days. We ask that you follow-up with your pharmacy.

## 2020-04-28 NOTE — Telephone Encounter (Signed)
Requested Prescriptions  Pending Prescriptions Disp Refills  . amLODipine (NORVASC) 5 MG tablet 180 tablet 0     Cardiovascular:  Calcium Channel Blockers Failed - 04/28/2020  9:14 AM      Failed - Valid encounter within last 6 months    Recent Outpatient Visits          3 months ago Dysphagia, unspecified type   [facility] Fulp, Cammie, MD   8 months ago Chronic obstructive pulmonary disease, unspecified COPD type (HCC)   [facility] Fulp, Cammie, MD   1 year ago Chronic obstructive pulmonary disease, unspecified COPD type (HCC)   [facility] Fulp, Cammie, MD   1 year ago Acute left-sided low back pain without sciatica   [facility] Johnson, Deborah B, MD   1 year ago COPD with acute exacerbation (HCC)   [facility] Fulp, Cammie, MD      Future Appointments            In 1 month Fleming, Zelda W, NP [facility]           Passed - Last BP in normal range    BP Readings from Last 1 Encounters:  04/07/20 132/76          

## 2020-04-28 NOTE — Telephone Encounter (Signed)
Requested Prescriptions  Pending Prescriptions Disp Refills  . amLODipine (NORVASC) 5 MG tablet 180 tablet 0     Cardiovascular:  Calcium Channel Blockers Failed - 04/28/2020  9:14 AM      Failed - Valid encounter within last 6 months    Recent Outpatient Visits          3 months ago Dysphagia, unspecified type   [place] Fulp, [place], MD   8 months ago Chronic obstructive pulmonary disease, unspecified COPD type ([place])   [facility] Fulp, [place], MD   1 year ago Chronic obstructive pulmonary disease, unspecified COPD type ([place])   [place] Fulp, [place], MD   1 year ago Acute left-sided low back pain without sciatica   [place] Ladell Pier, MD   1 year ago COPD with acute exacerbation [facility])   [place], MD      Future Appointments            In 1 month Gildardo Pounds, NP [place] BP in normal range    BP Readings from Last 1 Encounters:  04/07/20 132/76

## 2020-05-12 MED FILL — OMEPRAZOLE DR 40 MG CAPSULE: 40 | 30 days supply | Qty: 60 | Fill #2

## 2020-05-15 ENCOUNTER — Other Ambulatory Visit: Payer: Self-pay | Admitting: Family Medicine

## 2020-06-01 ENCOUNTER — Other Ambulatory Visit: Payer: Self-pay | Admitting: Adult Health

## 2020-06-02 ENCOUNTER — Other Ambulatory Visit: Payer: Self-pay

## 2020-06-02 ENCOUNTER — Ambulatory Visit: Payer: Medicaid Other | Attending: Nurse Practitioner | Admitting: Nurse Practitioner

## 2020-06-02 DIAGNOSIS — I10 Essential (primary) hypertension: Secondary | ICD-10-CM

## 2020-06-02 DIAGNOSIS — R7303 Prediabetes: Secondary | ICD-10-CM

## 2020-06-02 DIAGNOSIS — Z7689 Persons encountering health services in other specified circumstances: Secondary | ICD-10-CM

## 2020-06-02 DIAGNOSIS — J449 Chronic obstructive pulmonary disease, unspecified: Secondary | ICD-10-CM

## 2020-06-02 DIAGNOSIS — Z1159 Encounter for screening for other viral diseases: Secondary | ICD-10-CM

## 2020-06-02 DIAGNOSIS — Z1231 Encounter for screening mammogram for malignant neoplasm of breast: Secondary | ICD-10-CM

## 2020-06-02 DIAGNOSIS — D72829 Elevated white blood cell count, unspecified: Secondary | ICD-10-CM

## 2020-06-02 DIAGNOSIS — I700 Atherosclerosis of aorta: Secondary | ICD-10-CM

## 2020-06-02 NOTE — Progress Notes (Signed)
Virtual Visit via Telephone Note Due to national recommendations of social distancing due to [place] 19, telehealth visit is felt to be most appropriate for this patient at this time.  I discussed the limitations, risks, security and privacy concerns of performing an evaluation and management service by telephone and the availability of in person appointments. I also discussed with the patient that there may be a patient responsible charge related to this service. The patient expressed understanding and agreed to proceed.    I connected with Monica Scott on 06/02/20  at   3:10 PM EST  EDT by telephone and verified that I am speaking with the correct person using two identifiers.   Consent I discussed the limitations, risks, security and privacy concerns of performing an evaluation and management service by telephone and the availability of in person appointments. I also discussed with the patient that there may be a patient responsible charge related to this service. The patient expressed understanding and agreed to proceed.   Location of Patient: Private Residence   Location of Provider: [facility] [employer] Office    Persons participating in Telemedicine visit: Monica Rankins FNP-BC [place]    History of Present Illness: Telemedicine visit for: Establish Care She has a past medical history of Arthritis, Asthma, Bladder disease, COPD (former smoker), O2 dependent 2-3L, MILD OSA (not on CPAP) Depression, GERD, Hyperlipidemia, Hypertension, Neurogenic bladder with self catheterization, restless leg (taking neurontin) and Peptic ulcer.  Patient has been counseled on age-appropriate routine health concerns for screening and prevention. These are reviewed and up-to-date. Referrals have been placed accordingly. Immunizations are up-to-date or declined.    Last mammogram: "Years ago". Normal.  Pap Smear: Hysterectomy Colonoscopy: 3 year follow up due 2023.    She seees a therapist who is prescribing her bupropion and zyprexa.  COPD She sees pulmonology. On maximum therapy with Dulera and Spiriva. Duonebs as needed. She was recently referred to pulmonary rehab.   Prediabetes Well controlled with diet only at this time.  Lab Results  Component Value Date   HGBA1C 5.5 06/04/2020    The 10-year ASCVD risk score Mikey Bussing DC Brooke Bonito., et al., 2013) is: 8.7%   Values used to calculate the score:     Age: 65 years     Sex: Female     Is Non-Hispanic African American: No     Diabetic: No     Tobacco smoker: No     Systolic Blood Pressure: 409 mmHg     Is BP treated: Yes     HDL Cholesterol: 50 mg/dL     Total Cholesterol: 263 mg/dL    Essential Hypertension Most recent home blood pressure reading: 129/79. Well controlled taking amlodipine 5 mg daily, benicar 40 mg daily, Denies chest pain, shortness of breath, palpitations, lightheadedness, dizziness, headaches or BLE edema. She has previously takint lisinopril hctz but this was discontinued due to hypokalemia.  BP Readings from Last 3 Encounters:  04/07/20 132/76  01/03/20 (!) 160/70  11/24/19 133/84    Past Medical History:  Diagnosis Date  . Arthritis   . Asthma   . Bladder disease   . COPD (chronic obstructive pulmonary disease) ([place])   . Depression   . GERD (gastroesophageal reflux disease)   . Hyperlipidemia   . Hypertension   . Peptic ulcer     Past Surgical History:  Procedure Laterality Date  . ABDOMINAL HYSTERECTOMY    . BIOPSY  04/24/2018   Procedure: BIOPSY;  Surgeon: Daneil Dolin, MD;  [facility];  Service: Endoscopy;;  esophagus  . COLONOSCOPY WITH PROPOFOL N/A 04/24/2018   Procedure: COLONOSCOPY WITH PROPOFOL;  Surgeon: Daneil Dolin, MD;  [facility];  Service: Endoscopy;  Laterality: N/A;  9:00am  . ESOPHAGOGASTRODUODENOSCOPY (EGD) WITH PROPOFOL N/A 04/24/2018   Procedure: ESOPHAGOGASTRODUODENOSCOPY (EGD) WITH PROPOFOL;  Surgeon: Daneil Dolin, MD;  [facility];  Service: Endoscopy;  Laterality: N/A;  Venia Minks DILATION N/A 04/24/2018   Procedure: Venia Minks DILATION;  Surgeon: Daneil Dolin, MD;  [facility];  Service: Endoscopy;  Laterality: N/A;  . PILONIDAL CYST EXCISION     age 65  . POLYPECTOMY  04/24/2018   Procedure: POLYPECTOMY;  Surgeon: Daneil Dolin, MD;  [facility];  Service: Endoscopy;;  colon     Family History  Problem Relation Age of Onset  . Alcohol abuse Mother   . Arthritis Mother   . Cancer Mother   . Depression Mother   . Early death Mother   . Alcohol abuse Father   . Depression Father   . Alcohol abuse Sister   . Depression Sister   . Alcohol abuse Brother   . Alcohol abuse Daughter   . Depression Daughter   . Alcohol abuse Son   . COPD Maternal Grandfather   . Heart disease Maternal Grandfather   . Hypertension Maternal Grandfather   . Colon cancer Brother        deceased at age 30    Social History   Socioeconomic History  . Marital status: Single    Spouse name: Not on file  . Number of children: Not on file  . Years of education: Not on file  . Highest education level: Not on file  Occupational History  . Not on file  Tobacco Use  . Smoking status: Former Smoker    Packs/day: 1.50    Years: 40.00    Pack years: 60.00    Types: Cigarettes    Quit date: 04/19/2010    Years since quitting: 10.1  . Smokeless tobacco: Never Used  Vaping Use  . Vaping Use: Never used  Substance and Sexual Activity  . Alcohol use: No    Comment: Former ETOH. none since 2000  . Drug use: No  . Sexual activity: Yes    Birth control/protection: Surgical  Other Topics Concern  . Not on file  Social History Narrative  . Not on file   Social Determinants of Health   Financial Resource Strain: Not on file  Food Insecurity: Not on file  Transportation Needs: Not on file  Physical Activity: Not on file  Stress: Not on file  Social Connections: Not  on file     Observations/Objective: Awake, alert and oriented x 3   Review of Systems  Constitutional: Negative for fever, malaise/fatigue and weight loss.  HENT: Negative.  Negative for nosebleeds.   Eyes: Negative.  Negative for blurred vision, double vision and photophobia.  Respiratory: Positive for shortness of breath (chronic). Negative for cough and wheezing.   Cardiovascular: Negative.  Negative for chest pain, palpitations and leg swelling.  Gastrointestinal: Negative.  Negative for heartburn, nausea and vomiting.  Musculoskeletal: Negative.  Negative for myalgias.  Neurological: Negative.  Negative for dizziness, focal weakness, seizures and headaches.  Psychiatric/Behavioral: Negative.  Negative for suicidal ideas.    Assessment and Plan: Monica Scott was seen today for re-establish.  Diagnoses and all orders for this visit:  Encounter to establish care  Primary hypertension -     CMP14+EGFR Continue all antihypertensives as prescribed.  Remember to bring in your blood pressure log with you for your follow up appointment.  DASH/Mediterranean Diets are healthier choices for HTN.    Prediabetes -     Hemoglobin A1c  Chronic obstructive pulmonary disease, unspecified COPD type ([place]) Follow up with pulmonology    Breast cancer screening by mammogram -     MM 3D SCREEN BREAST BILATERAL; Future  Leukocytosis, unspecified type -     CBC  Need for hepatitis C screening test -     HCV Ab w Reflex to Quant PCR -     Interpretation:  Aortic atherosclerosis (HCC) -     Lipid panel -     atorvastatin (LIPITOR) 20 MG tablet; Take 1 tablet (20 mg total) by mouth daily. INSTRUCTIONS: Work on a low fat, heart healthy diet and participate in regular aerobic exercise program by working out at least 150 minutes per week; 5 days a week-30 minutes per day. Avoid red meat/beef/steak,  fried foods. junk foods, sodas, sugary drinks, unhealthy snacking, alcohol and smoking.  Drink at  least 80 oz of water per day and monitor your carbohydrate intake daily.     Follow Up Instructions Return in about 3 months (around 09/02/2020).     I discussed the assessment and treatment plan with the patient. The patient was provided an opportunity to ask questions and all were answered. The patient agreed with the plan and demonstrated an understanding of the instructions.   The patient was advised to call back or seek an in-person evaluation if the symptoms worsen or if the condition fails to improve as anticipated.  I provided 15 minutes of non-face-to-face time during this encounter including median intraservice time, reviewing previous notes, labs, imaging, medications and explaining diagnosis and management.  Gildardo Pounds, FNP-BC

## 2020-06-04 ENCOUNTER — Other Ambulatory Visit: Payer: Self-pay

## 2020-06-04 ENCOUNTER — Ambulatory Visit: Payer: Medicaid Other | Attending: Nurse Practitioner

## 2020-06-05 LAB — HEMOGLOBIN A1C
Est. average glucose Bld gHb Est-mCnc: 111 mg/dL
Hgb A1c MFr Bld: 5.5 % (ref 4.8–5.6)

## 2020-06-05 LAB — CMP14+EGFR
ALT: 14 IU/L (ref 0–32)
AST: 15 IU/L (ref 0–40)
Albumin/Globulin Ratio: 2 (ref 1.2–2.2)
Albumin: 4.5 g/dL (ref 3.8–4.8)
Alkaline Phosphatase: 101 IU/L (ref 44–121)
BUN/Creatinine Ratio: 14 (ref 12–28)
BUN: 13 mg/dL (ref 8–27)
Bilirubin Total: 0.4 mg/dL (ref 0.0–1.2)
CO2: 24 mmol/L (ref 20–29)
Calcium: 9.7 mg/dL (ref 8.7–10.3)
Chloride: 93 mmol/L — ABNORMAL LOW (ref 96–106)
Creatinine, Ser: 0.95 mg/dL (ref 0.57–1.00)
Globulin, Total: 2.2 g/dL (ref 1.5–4.5)
Glucose: 110 mg/dL — ABNORMAL HIGH (ref 65–99)
Potassium: 4.7 mmol/L (ref 3.5–5.2)
Sodium: 134 mmol/L (ref 134–144)
Total Protein: 6.7 g/dL (ref 6.0–8.5)
eGFR: 67 mL/min/{1.73_m2} (ref >59)

## 2020-06-05 LAB — CBC
Hematocrit: 36.2 % (ref 34.0–46.6)
Hemoglobin: 12 g/dL (ref 11.1–15.9)
MCH: 28.6 pg (ref 26.6–33.0)
MCHC: 33.1 g/dL (ref 31.5–35.7)
MCV: 86 fL (ref 79–97)
Platelets: 452 10*3/uL — ABNORMAL HIGH (ref 150–450)
RBC: 4.2 x10E6/uL (ref 3.77–5.28)
RDW: 14.5 % (ref 11.7–15.4)
WBC: 7.1 10*3/uL (ref 3.4–10.8)

## 2020-06-05 LAB — LIPID PANEL
Chol/HDL Ratio: 5.3 ratio — ABNORMAL HIGH (ref 0.0–4.4)
Cholesterol, Total: 263 mg/dL — ABNORMAL HIGH (ref 100–199)
HDL: 50 mg/dL (ref >39)
LDL Chol Calc (NIH): 171 mg/dL — ABNORMAL HIGH (ref 0–99)
Triglycerides: 223 mg/dL — ABNORMAL HIGH (ref 0–149)
VLDL Cholesterol Cal: 42 mg/dL — ABNORMAL HIGH (ref 5–40)

## 2020-06-05 LAB — HCV AB W REFLEX TO QUANT PCR: HCV Ab: <0.1 s/co ratio (ref 0.0–0.9)

## 2020-06-05 LAB — HCV INTERPRETATION

## 2020-06-08 ENCOUNTER — Encounter: Payer: Self-pay | Admitting: Nurse Practitioner

## 2020-06-08 MED ORDER — ATORVASTATIN CALCIUM 20 MG PO TABS: 20.0000 mg | ORAL_TABLET | Freq: Every day | ORAL | 3 refills | Status: DC

## 2020-06-09 ENCOUNTER — Other Ambulatory Visit: Payer: Self-pay | Admitting: Nurse Practitioner

## 2020-06-18 ENCOUNTER — Other Ambulatory Visit: Payer: Self-pay | Admitting: Family Medicine

## 2020-07-11 ENCOUNTER — Other Ambulatory Visit: Payer: Self-pay

## 2020-07-14 ENCOUNTER — Other Ambulatory Visit: Payer: Self-pay

## 2020-07-14 ENCOUNTER — Other Ambulatory Visit: Payer: Self-pay | Admitting: Family Medicine

## 2020-07-14 DIAGNOSIS — R1013 Epigastric pain: Secondary | ICD-10-CM

## 2020-07-14 DIAGNOSIS — K219 Gastro-esophageal reflux disease without esophagitis: Secondary | ICD-10-CM

## 2020-07-23 ENCOUNTER — Other Ambulatory Visit: Payer: Self-pay | Admitting: Family Medicine

## 2020-07-23 ENCOUNTER — Other Ambulatory Visit: Payer: Self-pay

## 2020-07-23 DIAGNOSIS — R1013 Epigastric pain: Secondary | ICD-10-CM

## 2020-07-23 DIAGNOSIS — K219 Gastro-esophageal reflux disease without esophagitis: Secondary | ICD-10-CM

## 2020-07-23 MED ORDER — OMEPRAZOLE 40 MG PO CPDR
DELAYED_RELEASE_CAPSULE | ORAL | 2 refills | Status: DC
  Filled 2020-07-23: qty 60, 30d supply, fill #0
  Filled 2020-09-22: qty 60, 30d supply, fill #1
  Filled 2020-12-09 – 2020-12-21 (×2): qty 60, 30d supply, fill #2

## 2020-07-24 ENCOUNTER — Other Ambulatory Visit: Payer: Self-pay

## 2020-07-25 ENCOUNTER — Other Ambulatory Visit: Payer: Self-pay

## 2020-07-25 ENCOUNTER — Ambulatory Visit (INDEPENDENT_AMBULATORY_CARE_PROVIDER_SITE_OTHER): Payer: Medicaid Other | Admitting: Family Medicine

## 2020-07-25 DIAGNOSIS — G8929 Other chronic pain: Secondary | ICD-10-CM

## 2020-07-25 DIAGNOSIS — M545 Low back pain, unspecified: Secondary | ICD-10-CM

## 2020-07-25 MED ORDER — BACLOFEN 10 MG PO TABS: 5.0000 mg | ORAL_TABLET | Freq: Three times a day (TID) | ORAL | 3 refills | Status: DC | PRN

## 2020-07-25 NOTE — Progress Notes (Signed)
Office Visit Note   Patient: Monica Scott           Date of Birth: December 25, 1955           MRN: [id number] Visit Date: 07/25/2020 Requested by: Gildardo Pounds, NP [place],  [place] [address] PCP: Gildardo Pounds, NP  Subjective: Chief Complaint  Patient presents with  . Lower Back - Pain    Pain has worsened in the left lower back/posterior hip region since last ov (January). She stopped taking the celebrex and tizanidine due to side effects: stomach issues with the celebrex and dizziness & dry mouth with the tizanidine.    HPI: She is here with worsening chronic back pain.  She was taking Zanaflex and Celebrex but developed some neck issues and dizziness so she stopped taking those.  Her pain is across the lower lumbar spine and into the posterior hips.  It is worse when sitting, worse when standing and walking and better when lying down.  She had x-rays in 2020.  She has not been to a chiropractor or physical therapist.              ROS:   All other systems were reviewed and are negative.  Objective: Vital Signs: There were no vitals taken for this visit.  Physical Exam:  General:  Alert and oriented, in no acute distress. Pulm:  Breathing unlabored. Psy:  Normal mood, congruent affect. Skin: No rash Low back: She is tender near the L5-S1 level.  She has pain in the left gluteus medius area.  Straight leg raise negative, lower extremity strength and reflexes are normal.  Imaging: No results found.  Assessment & Plan: 1.  Chronic low back pain -Elected to proceed with MRI scan.  We will base treatment on results of the scan.  Trial of baclofen.     Procedures: No procedures performed        PMFS History: Patient Active Problem List   Diagnosis Date Noted  . Physical deconditioning 04/07/2020  . Dyspnea 02/05/2019  . Chronic respiratory failure with hypoxia ([place]) 02/05/2019  . Daytime sleepiness 02/05/2019  . Pelvic relaxation due to cystocele,  midline 01/16/2019  . Emphysema of lung ([place]) 06/14/2018  . Aortic atherosclerosis ([place]) 06/14/2018  . Atherosclerosis of coronary artery 06/14/2018  . Thoracic aortic ectasia ([place]) 05/19/2018  . Esophageal dysphagia 03/01/2018  . Family history of colon cancer 03/01/2018  . Diarrhea 03/01/2018  . Near syncope 10/17/2017  . Hyperlipidemia 10/17/2017  . GERD (gastroesophageal reflux disease) 10/17/2017  . Bladder disease or syndrome 10/17/2017  . Depression 10/17/2017  . Hypertension, essential, benign 02/19/2017  . COPD (chronic obstructive pulmonary disease) ([place]) 02/11/2017  . Chest pain 02/12/2015  . Referral of patient 02/12/2015   Past Medical History:  Diagnosis Date  . Arthritis   . Asthma   . Bladder disease   . COPD (chronic obstructive pulmonary disease) ([place])   . Depression   . GERD (gastroesophageal reflux disease)   . Hyperlipidemia   . Hypertension   . Peptic ulcer     Family History  Problem Relation Age of Onset  . Alcohol abuse Mother   . Arthritis Mother   . Cancer Mother   . Depression Mother   . Early death Mother   . Alcohol abuse Father   . Depression Father   . Alcohol abuse Sister   . Depression Sister   . Alcohol abuse Brother   . Alcohol abuse Daughter   .  Depression Daughter   . Alcohol abuse Son   . COPD Maternal Grandfather   . Heart disease Maternal Grandfather   . Hypertension Maternal Grandfather   . Colon cancer Brother        deceased at age 7    Past Surgical History:  Procedure Laterality Date  . ABDOMINAL HYSTERECTOMY    . BIOPSY  04/24/2018   Procedure: BIOPSY;  Surgeon: Daneil Dolin, MD;  [facility];  Service: Endoscopy;;  esophagus  . COLONOSCOPY WITH PROPOFOL N/A 04/24/2018   Procedure: COLONOSCOPY WITH PROPOFOL;  Surgeon: Daneil Dolin, MD;  [facility];  Service: Endoscopy;  Laterality: N/A;  9:00am  . ESOPHAGOGASTRODUODENOSCOPY (EGD) WITH PROPOFOL N/A 04/24/2018   Procedure:  ESOPHAGOGASTRODUODENOSCOPY (EGD) WITH PROPOFOL;  Surgeon: Daneil Dolin, MD;  [facility];  Service: Endoscopy;  Laterality: N/A;  Venia Minks DILATION N/A 04/24/2018   Procedure: Venia Minks DILATION;  Surgeon: Daneil Dolin, MD;  [facility];  Service: Endoscopy;  Laterality: N/A;  . PILONIDAL CYST EXCISION     age 30  . POLYPECTOMY  04/24/2018   Procedure: POLYPECTOMY;  Surgeon: Daneil Dolin, MD;  [facility];  Service: Endoscopy;;  colon    Social History   Occupational History  . Not on file  Tobacco Use  . Smoking status: Former Smoker    Packs/day: 1.50    Years: 40.00    Pack years: 60.00    Types: Cigarettes    Quit date: 04/19/2010    Years since quitting: 10.2  . Smokeless tobacco: Never Used  Vaping Use  . Vaping Use: Never used  Substance and Sexual Activity  . Alcohol use: No    Comment: Former ETOH. none since 2000  . Drug use: No  . Sexual activity: Yes    Birth control/protection: Surgical

## 2020-08-06 ENCOUNTER — Ambulatory Visit
Admission: RE | Admit: 2020-08-06 | Discharge: 2020-08-06 | Disposition: A | Discharge status: Home / Self Care | Payer: Medicaid Other | Source: Ambulatory Visit | Attending: Family Medicine | Admitting: Family Medicine

## 2020-08-06 ENCOUNTER — Other Ambulatory Visit: Payer: Self-pay

## 2020-08-06 DIAGNOSIS — M545 Low back pain, unspecified: Secondary | ICD-10-CM

## 2020-08-07 ENCOUNTER — Ambulatory Visit
Admission: RE | Admit: 2020-08-07 | Discharge: 2020-08-07 | Disposition: A | Discharge status: Home / Self Care | Payer: Medicaid Other | Source: Ambulatory Visit | Attending: Nurse Practitioner | Admitting: Nurse Practitioner

## 2020-08-07 ENCOUNTER — Telehealth: Payer: Self-pay | Admitting: Family Medicine

## 2020-08-07 DIAGNOSIS — M545 Low back pain, unspecified: Secondary | ICD-10-CM

## 2020-08-07 DIAGNOSIS — G8929 Other chronic pain: Secondary | ICD-10-CM

## 2020-08-07 DIAGNOSIS — Z1231 Encounter for screening mammogram for malignant neoplasm of breast: Secondary | ICD-10-CM

## 2020-08-07 NOTE — Telephone Encounter (Signed)
MRI shows a left-sided disc protrusion causing impingement on the left L3 nerve.  This could be the cause of your pain.    Treatment options include trying physical therapy, or referral for an epidural steroid injection.  Surgery if fails to improve.

## 2020-08-08 ENCOUNTER — Other Ambulatory Visit: Payer: Self-pay | Admitting: Nurse Practitioner

## 2020-08-08 DIAGNOSIS — I10 Essential (primary) hypertension: Secondary | ICD-10-CM

## 2020-08-11 NOTE — Addendum Note (Signed)
Addended by: Hortencia Pilar on: 08/11/2020 09:15 AM   Modules accepted: Orders

## 2020-08-11 NOTE — Telephone Encounter (Signed)
Ordered

## 2020-08-12 ENCOUNTER — Telehealth: Payer: Self-pay | Admitting: Pulmonary Disease

## 2020-08-12 MED ORDER — SPIRIVA RESPIMAT 2.5 MCG/ACT IN AERS: 2.0000 | INHALATION_SPRAY | Freq: Every day | RESPIRATORY_TRACT | 5 refills | Status: DC

## 2020-08-12 NOTE — Telephone Encounter (Signed)
Called and spoke with pt and she is aware of refill that has been sent to the pharmacy.  

## 2020-08-15 ENCOUNTER — Encounter: Payer: Self-pay | Admitting: Nurse Practitioner

## 2020-08-15 ENCOUNTER — Ambulatory Visit: Payer: Medicaid Other | Attending: Nurse Practitioner | Admitting: Nurse Practitioner

## 2020-08-15 ENCOUNTER — Other Ambulatory Visit: Payer: Self-pay

## 2020-08-15 VITALS — BP 101/68 | HR 86 | Ht 59.0 in | Wt 161.4 lb

## 2020-08-15 DIAGNOSIS — J449 Chronic obstructive pulmonary disease, unspecified: Secondary | ICD-10-CM | POA: Insufficient documentation

## 2020-08-15 DIAGNOSIS — E785 Hyperlipidemia, unspecified: Secondary | ICD-10-CM | POA: Insufficient documentation

## 2020-08-15 DIAGNOSIS — K219 Gastro-esophageal reflux disease without esophagitis: Secondary | ICD-10-CM | POA: Diagnosis not present

## 2020-08-15 DIAGNOSIS — Z7951 Long term (current) use of inhaled steroids: Secondary | ICD-10-CM | POA: Insufficient documentation

## 2020-08-15 DIAGNOSIS — Z79899 Other long term (current) drug therapy: Secondary | ICD-10-CM | POA: Diagnosis not present

## 2020-08-15 DIAGNOSIS — I10 Essential (primary) hypertension: Secondary | ICD-10-CM

## 2020-08-15 DIAGNOSIS — I700 Atherosclerosis of aorta: Secondary | ICD-10-CM

## 2020-08-15 NOTE — Progress Notes (Signed)
Assessment & Plan:  Monica Scott was seen today for follow-up.  Diagnoses and all orders for this visit:  Essential hypertension Continue all antihypertensives as prescribed.  Remember to bring in your blood pressure log with you for your follow up appointment.  DASH/Mediterranean Diets are healthier choices for HTN.    Aortic atherosclerosis ([place]) -     Lipid panel    Patient has been counseled on age-appropriate routine health concerns for screening and prevention. These are reviewed and up-to-date. Referrals have been placed accordingly. Immunizations are up-to-date or declined.    Subjective:   Chief Complaint  Patient presents with  . Follow-up   HPI Monica Scott 65 y.o. female presents to office today for follow up.  She has a past medical history of Arthritis, Asthma, Bladder disease, COPD , Depression, GERD, Hyperlipidemia, Hypertension, and Peptic ulcer.  She did not have her O2 time when I entered the exam room.  States that it is difficult to wear her mask and her portable O2.  I did instruct her to place her O2 on correctly and that it should be worn as recommended   Essential Hypertension Well controlled. She is currently on the keto diet. May need to discontinue amlodipine 5 mg daily if she continues to lose weight and blood pressure decreases. She is able to monitor her blood pressure at home and is aware of parameters to call the office. Denies chest pain, shortness of breath, palpitations, lightheadedness, dizziness, headaches or BLE edema.  BP Readings from Last 3 Encounters:  08/15/20 101/68  04/07/20 132/76  01/03/20 (!) 160/70   Aortic Atherosclerosis Lipids not at goal. Taking atorvastatin 20 mg daily as prescribed.  May need to increase based on results from lipid panel drawn today. Lab Results  Component Value Date   CHOL 263 (H) 06/04/2020   CHOL 249 (H) 10/18/2017   Lab Results  Component Value Date   HDL 50 06/04/2020   HDL 64 10/18/2017    Lab Results  Component Value Date   LDLCALC 171 (H) 06/04/2020   LDLCALC 164 (H) 10/18/2017   Lab Results  Component Value Date   TRIG 223 (H) 06/04/2020   TRIG 106 10/18/2017   Lab Results  Component Value Date   CHOLHDL 5.3 (H) 06/04/2020   CHOLHDL 3.9 10/18/2017    Past Medical History:  Diagnosis Date  . Arthritis   . Asthma   . Bladder disease   . COPD (chronic obstructive pulmonary disease) ([place])   . Depression   . GERD (gastroesophageal reflux disease)   . Hyperlipidemia   . Hypertension   . Peptic ulcer     Past Surgical History:  Procedure Laterality Date  . ABDOMINAL HYSTERECTOMY    . BIOPSY  04/24/2018   Procedure: BIOPSY;  Surgeon: Daneil Dolin, MD;  [facility];  Service: Endoscopy;;  esophagus  . COLONOSCOPY WITH PROPOFOL N/A 04/24/2018   Procedure: COLONOSCOPY WITH PROPOFOL;  Surgeon: Daneil Dolin, MD;  [facility];  Service: Endoscopy;  Laterality: N/A;  9:00am  . ESOPHAGOGASTRODUODENOSCOPY (EGD) WITH PROPOFOL N/A 04/24/2018   Procedure: ESOPHAGOGASTRODUODENOSCOPY (EGD) WITH PROPOFOL;  Surgeon: Daneil Dolin, MD;  [facility];  Service: Endoscopy;  Laterality: N/A;  Venia Minks DILATION N/A 04/24/2018   Procedure: Venia Minks DILATION;  Surgeon: Daneil Dolin, MD;  [facility];  Service: Endoscopy;  Laterality: N/A;  . PILONIDAL CYST EXCISION     age 63  . POLYPECTOMY  04/24/2018   Procedure: POLYPECTOMY;  Surgeon: Daneil Dolin, MD;  [facility];  Service: Endoscopy;;  colon     Family History  Problem Relation Age of Onset  . Alcohol abuse Mother   . Arthritis Mother   . Cancer Mother   . Depression Mother   . Early death Mother   . Alcohol abuse Father   . Depression Father   . Alcohol abuse Sister   . Depression Sister   . Alcohol abuse Brother   . Alcohol abuse Daughter   . Depression Daughter   . Alcohol abuse Son   . COPD Maternal Grandfather   . Heart disease  Maternal Grandfather   . Hypertension Maternal Grandfather   . Colon cancer Brother        deceased at age 44    Social History Reviewed with no changes to be made today.   Outpatient Medications Prior to Visit  Medication Sig Dispense Refill  . albuterol (PROVENTIL) (2.5 MG/3ML) 0.083% nebulizer solution Take 3 mLs (2.5 mg total) by nebulization every 6 (six) hours as needed for wheezing or shortness of breath. 360 mL 5  . albuterol (VENTOLIN HFA) 108 (90 Base) MCG/ACT inhaler Inhale 1-2 puffs into the lungs every 6 (six) hours as needed for wheezing or shortness of breath. 6.7 g 6  . amLODipine (NORVASC) 5 MG tablet Take 1 tablet by mouth twice daily 180 tablet 0  . atorvastatin (LIPITOR) 20 MG tablet Take 1 tablet (20 mg total) by mouth daily. 90 tablet 3  . baclofen (LIORESAL) 10 MG tablet Take 0.5-1 tablets (5-10 mg total) by mouth 3 (three) times daily as needed for muscle spasms. 30 each 3  . buPROPion (WELLBUTRIN XL) 150 MG 24 hr tablet Take 150 mg by mouth at bedtime.    . DULERA 100-5 MCG/ACT AERO Inhale 2 puffs by mouth twice daily 13 g 11  . FLUoxetine (PROZAC) 20 MG capsule Take 60 mg by mouth daily.    Marland Kitchen gabapentin (NEURONTIN) 400 MG capsule Take 1 capsule (400 mg total) by mouth 3 (three) times daily. 90 capsule 11  . ibuprofen (ADVIL,MOTRIN) 200 MG tablet Take 400 mg by mouth 3 (three) times daily as needed for moderate pain.    Marland Kitchen ipratropium-albuterol (DUONEB) 0.5-2.5 (3) MG/3ML SOLN SMARTSIG:1 Ampule(s) Via Nebulizer Every 6 Hours PRN    . loperamide (IMODIUM A-D) 2 MG tablet Take 4 mg by mouth 2 (two) times daily as needed for diarrhea or loose stools.    Marland Kitchen OLANZapine (ZYPREXA) 5 MG tablet Take 10 mg by mouth at bedtime.     Marland Kitchen olmesartan (BENICAR) 40 MG tablet TAKE 1 TABLET BY MOUTH EVERY DAY 30 tablet 3  . omeprazole (PRILOSEC) 40 MG capsule TAKE 1 CAPSULE (40 MG TOTAL) BY MOUTH 2 (TWO) TIMES DAILY. TO REDUCE STOMACH ACID 60 capsule 2  . Tiotropium Bromide Monohydrate  (SPIRIVA RESPIMAT) 2.5 MCG/ACT AERS Inhale 2 puffs into the lungs daily. 1 g 5  . traZODone (DESYREL) 100 MG tablet Take 100 mg by mouth at bedtime as needed.    . mometasone-formoterol (DULERA) 100-5 MCG/ACT AERO Inhale 2 puffs into the lungs in the morning and at bedtime. (Patient not taking: No sig reported) 1 each 11   No facility-administered medications prior to visit.    Allergies  Allergen Reactions  . Tizanidine Other (See Comments)    Caused dizziness and dry mouth. Patient prefers not to take this again.   Review of Systems  Constitutional: Negative for fever, malaise/fatigue and weight loss.  HENT: Negative.  Negative for nosebleeds.   Eyes: Negative.  Negative for blurred vision, double vision and photophobia.  Respiratory: Negative.  Negative for cough and shortness of breath.   Cardiovascular: Negative.  Negative for chest pain, palpitations and leg swelling.  Gastrointestinal: Negative.  Negative for heartburn, nausea and vomiting.  Musculoskeletal: Negative.  Negative for myalgias.  Neurological: Negative.  Negative for dizziness, focal weakness, seizures and headaches.  Psychiatric/Behavioral: Negative.  Negative for suicidal ideas.      Objective:    BP 101/68   Pulse 86   Ht 4\' 11"  (1.499 m)   Wt 161 lb 6.4 oz (73.2 kg)   SpO2 (!) 89% Comment: HAS 02 BUT NOT ON AT THIS TIME  BMI 32.60 kg/m  Wt Readings from Last 3 Encounters:  08/15/20 161 lb 6.4 oz (73.2 kg)  04/07/20 173 lb 6.4 oz (78.7 kg)  01/03/20 169 lb 12.8 oz (77 kg)    Physical Exam Vitals and nursing note reviewed.  Constitutional:      Appearance: She is well-developed.  HENT:     Head: Normocephalic and atraumatic.  Cardiovascular:     Rate and Rhythm: Normal rate and regular rhythm.     Heart sounds: Normal heart sounds. No murmur heard. No friction rub. No gallop.   Pulmonary:     Effort: Pulmonary effort is normal. No tachypnea or respiratory distress.     Breath sounds: Normal  breath sounds. No decreased breath sounds, wheezing, rhonchi or rales.  Chest:     Chest wall: No tenderness.  Abdominal:     General: Bowel sounds are normal.     Palpations: Abdomen is soft.  Musculoskeletal:        General: Normal range of motion.     Cervical back: Normal range of motion.  Skin:    General: Skin is warm and dry.  Neurological:     Mental Status: She is alert and oriented to person, place, and time.     Coordination: Coordination normal.  Psychiatric:        Behavior: Behavior normal. Behavior is cooperative.        Thought Content: Thought content normal.        Judgment: Judgment normal.          Patient has been counseled extensively about nutrition and exercise as well as the importance of adherence with medications and regular follow-up. The patient was given clear instructions to go to ER or return to medical center if symptoms don't improve, worsen or new problems develop. The patient verbalized understanding.   Follow-up: Return in about 3 months (around 11/15/2020).   Gildardo Pounds, FNP-BC [facility] [place], [place]   08/15/2020, 12:55 PM

## 2020-08-16 LAB — LIPID PANEL
Chol/HDL Ratio: 3.9 ratio (ref 0.0–4.4)
Cholesterol, Total: 196 mg/dL (ref 100–199)
HDL: 50 mg/dL (ref >39)
LDL Chol Calc (NIH): 104 mg/dL — ABNORMAL HIGH (ref 0–99)
Triglycerides: 246 mg/dL — ABNORMAL HIGH (ref 0–149)
VLDL Cholesterol Cal: 42 mg/dL — ABNORMAL HIGH (ref 5–40)

## 2020-08-26 ENCOUNTER — Telehealth: Payer: Self-pay

## 2020-08-26 DIAGNOSIS — G8929 Other chronic pain: Secondary | ICD-10-CM

## 2020-08-26 DIAGNOSIS — M545 Low back pain, unspecified: Secondary | ICD-10-CM

## 2020-08-26 NOTE — Telephone Encounter (Signed)
Patient called she is requesting a referral to be sent to physical therapy, she wants to do pt in our office call back:[phone]

## 2020-08-26 NOTE — Telephone Encounter (Signed)
I called and let her know that the order for PT has been placed and they will call her to schedule her appt

## 2020-08-26 NOTE — Addendum Note (Signed)
Addended by: Hortencia Pilar on: 08/26/2020 02:16 PM   Modules accepted: Orders

## 2020-08-26 NOTE — Telephone Encounter (Signed)
Please advise. (patient did not want the ESI)

## 2020-08-26 NOTE — Telephone Encounter (Signed)
Orders placed.

## 2020-08-28 ENCOUNTER — Telehealth: Payer: Self-pay | Admitting: Family Medicine

## 2020-08-28 MED ORDER — METHOCARBAMOL 750 MG PO TABS: 750.0000 mg | ORAL_TABLET | Freq: Four times a day (QID) | ORAL | 3 refills | Status: DC | PRN

## 2020-08-28 NOTE — Telephone Encounter (Signed)
Patient called advised the Baclofen is no longer working. Patient asked if she can be put on another medication that will work better? The number to contact patient is [phone]

## 2020-08-28 NOTE — Telephone Encounter (Signed)
Please advise 

## 2020-08-28 NOTE — Telephone Encounter (Signed)
I called and advised the patient. 

## 2020-09-09 ENCOUNTER — Other Ambulatory Visit: Payer: Self-pay

## 2020-09-09 ENCOUNTER — Ambulatory Visit: Payer: Medicaid Other | Attending: Family Medicine | Admitting: Physical Therapy

## 2020-09-09 DIAGNOSIS — M6281 Muscle weakness (generalized): Secondary | ICD-10-CM

## 2020-09-09 DIAGNOSIS — G8929 Other chronic pain: Secondary | ICD-10-CM | POA: Insufficient documentation

## 2020-09-09 DIAGNOSIS — M545 Low back pain, unspecified: Secondary | ICD-10-CM | POA: Diagnosis present

## 2020-09-09 DIAGNOSIS — M25552 Pain in left hip: Secondary | ICD-10-CM | POA: Insufficient documentation

## 2020-09-09 NOTE — Therapy (Signed)
[place], [place], [address] Phone: [phone]   Fax:  [phone]  Physical Therapy Evaluation  Patient Details  Name: Monica Scott MRN: [id number] Date of Birth: 06/22/1955 Referring Provider (PT): Eunice Blase, MD   Encounter Date: 09/09/2020   PT End of Session - 09/09/20 0932     Visit Number 1    Number of Visits 10    Date for PT Re-Evaluation 10/21/20    Authorization Type Medicaid -- Needs initial 3 visit auth    Authorization - Visit Number 0    Authorization - Number of Visits 3    PT Start Time 0932    PT Stop Time 1006    PT Time Calculation (min) 34 min    Activity Tolerance Patient tolerated treatment well    Behavior During Therapy [facility] for tasks assessed/performed             Past Medical History:  Diagnosis Date   Arthritis    Asthma    Bladder disease    COPD (chronic obstructive pulmonary disease) ([place])    Depression    GERD (gastroesophageal reflux disease)    Hyperlipidemia    Hypertension    Peptic ulcer     Past Surgical History:  Procedure Laterality Date   ABDOMINAL HYSTERECTOMY     BIOPSY  04/24/2018   Procedure: BIOPSY;  Surgeon: Daneil Dolin, MD;  [facility];  Service: Endoscopy;;  esophagus   COLONOSCOPY WITH PROPOFOL N/A 04/24/2018   Procedure: COLONOSCOPY WITH PROPOFOL;  Surgeon: Daneil Dolin, MD;  [facility];  Service: Endoscopy;  Laterality: N/A;  9:00am   ESOPHAGOGASTRODUODENOSCOPY (EGD) WITH PROPOFOL N/A 04/24/2018   Procedure: ESOPHAGOGASTRODUODENOSCOPY (EGD) WITH PROPOFOL;  Surgeon: Daneil Dolin, MD;  [facility];  Service: Endoscopy;  Laterality: N/A;   MALONEY DILATION N/A 04/24/2018   Procedure: Venia Minks DILATION;  Surgeon: Daneil Dolin, MD;  [facility];  Service: Endoscopy;  Laterality: N/A;   PILONIDAL CYST EXCISION     age 65   POLYPECTOMY  04/24/2018   Procedure: POLYPECTOMY;   Surgeon: Daneil Dolin, MD;  [facility];  Service: Endoscopy;;  colon     There were no vitals filed for this visit.    Subjective Assessment - 09/09/20 0934     Subjective Pt reports L low back pain from her spine to her hip. Pt states it's been going 8-10 months. Pt states her back has been bad since she was 28 but it has progressively gotten worse. Pt states she's barely been able to do her regular activity. Pt states the pain is worst in the morning. Sitting hurts. "It's like a knot in my back." Pt notes she's on O2 which only lasts ~1 hr. Reports she can barely lift 5 lbs but lifts her 65 y/o grandson.    Pertinent History Arthritis, Asthma, Bladder disease, COPD , Depression, GERD, Hyperlipidemia, Hypertension, and Peptic ulcer    Limitations Sitting    How long can you sit comfortably? 5 minutes    How long can you stand comfortably? "A little pressure"    How long can you walk comfortably? Okay but can catch sometimes    Diagnostic tests MRI: Given left hip symptoms, dominant findings are at L2-3 where a left  paracentral and subarticular recess protrusion indents the thecal  sac and impinges on the left L3 root.    Patient Stated Goals  Ease the pain    Currently in Pain? Yes    Pain Score 7     Pain Location Back    Pain Orientation Left;Lower    Pain Descriptors / Indicators Stabbing   "big knot in my back"   Pain Type Chronic pain    Pain Onset More than a month ago    Pain Frequency Intermittent    Aggravating Factors  time sitting, lifting    Pain Relieving Factors Ice                [facility] PT Assessment - 09/09/20 0001       Assessment   Medical Diagnosis M54.50,G89.29 (ICD-10-CM) - Chronic bilateral low back pain without sciatica    Referring Provider (PT) Hilts, Michael, MD    Prior Therapy None      Precautions   Precautions None      Restrictions   Weight Bearing Restrictions No      Balance Screen   Has the patient fallen in the past 6  months No      [place] residence    Living Arrangements Alone    Available Help at Discharge Family    Home Access Level entry    [place] --   Oxygen     Prior Function   Level of Independence Independent    Vocation Retired    Leisure Watch TV      Observation/Other Assessments   Focus on Therapeutic Outcomes (FOTO)  N/A      ROM / Strength   AROM / PROM / Strength AROM;Strength      AROM   AROM Assessment Site Lumbar    Lumbar Flexion WFL   pain at end ragne   Lumbar Extension WFL    Lumbar - Right Side Bend WFL   "small pull on the left"   Lumbar - Left Side Bend To knee   Increased pain   Lumbar - Right Rotation WFL    Lumbar - Left Rotation [facility]      Strength   Strength Assessment Site Hip;Knee    Right/Left Hip Right;Left    Right Hip Flexion 5/5    Right Hip Extension 4+/5    Right Hip ABduction 5/5    Left Hip Flexion 3+/5   Limited due to pain   Left Hip Extension 3+/5    Left Hip ABduction 3/5    Right/Left Knee Right;Left    Right Knee Flexion 5/5    Right Knee Extension 5/5    Left Knee Flexion 4/5   Limited due to pain/pull in low left back/hip   Left Knee Extension 4/5   Limited due to pain/pull in low left back/hip     Special Tests    Special Tests Lumbar    Lumbar Tests Straight Leg Raise;FABER test;Slump Test;Prone Knee Bend Test      FABER test   findings Negative      Slump test   Findings Negative      Straight Leg Raise   Findings Positive    Side  Left    Comment at ~45 deg      Standardized Balance Assessment   Standardized Balance Assessment Five Times Sit to Stand    Five times sit to stand comments  13 sec                        Objective measurements completed on examination:  See above findings.               PT Education - 09/09/20 1322     Education Details Exam findings, POC and initial HEP    Person(s) Educated Patient    Methods  Explanation;Demonstration;Handout    Comprehension Verbalized understanding;Returned demonstration;Verbal cues required;Tactile cues required              PT Short Term Goals - 09/09/20 1315       PT SHORT TERM GOAL #1   Title Pt will be independent with initial HEP    Time 3    Period Weeks    Status New    Target Date 09/30/20      PT SHORT TERM GOAL #2   Title Pt will report at least 25% improvement with her pain    Time 3    Period Weeks    Status New    Target Date 09/30/20      PT SHORT TERM GOAL #3   Title Pt will improve 5x STS <10 sec to demo improved LE functional strength    Time 3    Period Weeks    Status New    Target Date 09/30/20               PT Long Term Goals - 09/09/20 1317       PT LONG TERM GOAL #1   Title Pt will be independent with advanced HEP    Time 6    Period Weeks    Status New    Target Date 10/21/20      PT LONG TERM GOAL #2   Title Pt will report >50% improvement with pain    Time 6    Period Weeks    Status New    Target Date 10/21/20      PT LONG TERM GOAL #3   Title Pt will be able to lift at least 20 lbs with good mechanics so she can lift her grandchild with less pain    Baseline "unable to lift more than 5 lbs"    Time 6    Period Weeks    Status New    Target Date 10/21/20      PT LONG TERM GOAL #4   Title Pt will be able to tolerate sitting at least 15 minutes    Time 6    Period Weeks    Status New    Target Date 10/21/20                    Plan - 09/09/20 1007     Clinical Impression Statement Ms. Monica Scott is a 65 y/o F presenting to OPPT due to complaint of chronic left low back pain radiating into hip. On assessment, pt demos s/s of L QL, lumbar paraspinals and L upper glute spasm with gross L>R hip weakness and decreased core strength. Pt with large lower QL trigger point which is tender to palpation. Pt has gross lumbar ROM but pain at end range flexion and L side flexion. Pt would  benefit from PT to optimize her overall mobility for improved ADLs and decreased pain while caring for her grandchild.    Personal Factors and Comorbidities Age;Time since onset of injury/illness/exacerbation;Comorbidity 1;Fitness;Past/Current Experience    Comorbidities Arthritis, Asthma, Bladder disease, COPD , Depression, GERD, Hyperlipidemia, Hypertension, and Peptic ulcer.    Examination-Activity Limitations Transfers;Sit;Carry;Caring for Others;Squat;Stairs;Lift    Examination-Participation Restrictions Cleaning;Community Activity;Laundry;Meal Prep  Stability/Clinical Decision Making Evolving/Moderate complexity    Clinical Decision Making Moderate    Rehab Potential Good    PT Frequency --   2x/wk for 3 wks followed with 1x/wk for additional 3 wks   PT Duration 6 weeks    PT Treatment/Interventions ADLs/Self Care Home Management;Aquatic Therapy;Cryotherapy;Electrical Stimulation;Iontophoresis 4mg /ml Dexamethasone;Moist Heat;Traction;Gait training;Stair training;Functional mobility training;Therapeutic activities;Therapeutic exercise;Balance training;Neuromuscular re-education;Manual techniques;Patient/family education;Passive range of motion;Dry needling;Taping;Vasopneumatic Device;Spinal Manipulations;Joint Manipulations    PT Next Visit Plan Assess response to HEP. Manual therapy and TPDN as pt tolerates. Continue lumbar stretching, hip/core strengthening.    PT Home Exercise Plan Access Code: [email or web address]    [email or web address] and Agree with Plan of Care Patient             Patient will benefit from skilled therapeutic intervention in order to improve the following deficits and impairments:  Increased fascial restricitons, Cardiopulmonary status limiting activity, Increased muscle spasms, Decreased activity tolerance, Pain, Improper body mechanics, Decreased strength, Decreased mobility, Postural dysfunction  Visit Diagnosis: Muscle weakness (generalized)  Chronic left-sided low back pain  without sciatica  Pain in left hip     Problem List Patient Active Problem List   Diagnosis Date Noted   Physical deconditioning 04/07/2020   Dyspnea 02/05/2019   Chronic respiratory failure with hypoxia (HCC) 02/05/2019   Daytime sleepiness 02/05/2019   Pelvic relaxation due to cystocele, midline 01/16/2019   Emphysema of lung ([place]) 06/14/2018   Aortic atherosclerosis ([place]) 06/14/2018   Atherosclerosis of coronary artery 06/14/2018   Thoracic aortic ectasia ([place]) 05/19/2018   Esophageal dysphagia 03/01/2018   Family history of colon cancer 03/01/2018   Diarrhea 03/01/2018   Near syncope 10/17/2017   Hyperlipidemia 10/17/2017   GERD (gastroesophageal reflux disease) 10/17/2017   Bladder disease or syndrome 10/17/2017   Depression 10/17/2017   Hypertension, essential, benign 02/19/2017   COPD (chronic obstructive pulmonary disease) ([place]) 02/11/2017   Chest pain 02/12/2015   Referral of patient 02/12/2015    [facility] April Beatrix Shipper Akari Crysler PT, DPT 09/09/2020, 1:22 PM  [facility] [address] [place], [place], [address] Phone: [phone]   Fax:  [phone]  Name: Monica Scott MRN: [id number] Date of Birth: Jul 27, 1955

## 2020-09-16 ENCOUNTER — Ambulatory Visit: Payer: Medicaid Other

## 2020-09-18 ENCOUNTER — Ambulatory Visit: Payer: Medicaid Other

## 2020-09-18 ENCOUNTER — Other Ambulatory Visit: Payer: Self-pay

## 2020-09-18 DIAGNOSIS — M25552 Pain in left hip: Secondary | ICD-10-CM

## 2020-09-18 DIAGNOSIS — G8929 Other chronic pain: Secondary | ICD-10-CM

## 2020-09-18 DIAGNOSIS — M6281 Muscle weakness (generalized): Secondary | ICD-10-CM | POA: Diagnosis not present

## 2020-09-19 NOTE — Therapy (Signed)
[place], [place], [address] Phone: [phone]   Fax:  [phone]  Physical Therapy Treatment  Patient Details  Name: Monica Scott MRN: [id number] Date of Birth: Aug 23, 1955 Referring Provider (PT): Eunice Blase, MD   Encounter Date: 09/18/2020   PT End of Session - 09/18/20 0832     Visit Number 2    Number of Visits 10    Date for PT Re-Evaluation 10/21/20    Authorization Type Medicaid -- Needs initial 3 visit auth    Authorization - Visit Number 1    Authorization - Number of Visits 3    PT Start Time 0809    PT Stop Time 0850    PT Time Calculation (min) 41 min    Activity Tolerance Patient tolerated treatment well    Behavior During Therapy [facility] for tasks assessed/performed             Past Medical History:  Diagnosis Date   Arthritis    Asthma    Bladder disease    COPD (chronic obstructive pulmonary disease) ([place])    Depression    GERD (gastroesophageal reflux disease)    Hyperlipidemia    Hypertension    Peptic ulcer     Past Surgical History:  Procedure Laterality Date   ABDOMINAL HYSTERECTOMY     BIOPSY  04/24/2018   Procedure: BIOPSY;  Surgeon: Daneil Dolin, MD;  [facility];  Service: Endoscopy;;  esophagus   COLONOSCOPY WITH PROPOFOL N/A 04/24/2018   Procedure: COLONOSCOPY WITH PROPOFOL;  Surgeon: Daneil Dolin, MD;  [facility];  Service: Endoscopy;  Laterality: N/A;  9:00am   ESOPHAGOGASTRODUODENOSCOPY (EGD) WITH PROPOFOL N/A 04/24/2018   Procedure: ESOPHAGOGASTRODUODENOSCOPY (EGD) WITH PROPOFOL;  Surgeon: Daneil Dolin, MD;  [facility];  Service: Endoscopy;  Laterality: N/A;   MALONEY DILATION N/A 04/24/2018   Procedure: Venia Minks DILATION;  Surgeon: Daneil Dolin, MD;  [facility];  Service: Endoscopy;  Laterality: N/A;   PILONIDAL CYST EXCISION     age 70   POLYPECTOMY  04/24/2018   Procedure: POLYPECTOMY;   Surgeon: Daneil Dolin, MD;  [facility];  Service: Endoscopy;;  colon     There were no vitals filed for this visit.   Subjective Assessment - 09/18/20 0818     Subjective Pt reports having good and bad days, and today is a bad day. No reason why one day is different than the next.    Diagnostic tests MRI: Given left hip symptoms, dominant findings are at L2-3 where a left  paracentral and subarticular recess protrusion indents the thecal  sac and impinges on the left L3 root.    Patient Stated Goals Ease the pain    Currently in Pain? Yes    Pain Score 9     Pain Location Back    Pain Orientation Left;Lower    Pain Descriptors / Indicators Stabbing    Pain Type Chronic pain    Pain Onset More than a month ago    Pain Frequency Intermittent                               [facility] Adult PT Treatment/Exercise - 09/19/20 0001       Self-Care   Self-Care Other Self-Care Comments    Other Self-Care Comments  Use of a towel roll to maintain lumbar lodosis.  Pt experienced a decrease in pain with the towel roll in place vs. no towel roll      Exercises   Exercises Lumbar      Lumbar Exercises: Stretches   Other Lumbar Stretch Exercise PT assessment and treatment was completed for directional preference for reduction of low back pain. Forward flexion in standing, sitting and knee to chest in supine all increased pt's L low back pain, while prone lying, prone on pillows, and prone press ups resolved pt's low back pain to 0/10.                    PT Education - 09/19/20 1459     Education Details HEP for prone press ups. Use of towel roll to maintain lumbar lordosis.    Person(s) Educated Patient    Methods Explanation;Demonstration;Tactile cues;Verbal cues;Handout    Comprehension Verbalized understanding;Returned demonstration;Verbal cues required;Tactile cues required;Need further instruction              PT Short Term Goals -  09/09/20 1315       PT SHORT TERM GOAL #1   Title Pt will be independent with initial HEP    Time 3    Period Weeks    Status New    Target Date 09/30/20      PT SHORT TERM GOAL #2   Title Pt will report at least 25% improvement with her pain    Time 3    Period Weeks    Status New    Target Date 09/30/20      PT SHORT TERM GOAL #3   Title Pt will improve 5x STS <10 sec to demo improved LE functional strength    Time 3    Period Weeks    Status New    Target Date 09/30/20               PT Long Term Goals - 09/09/20 1317       PT LONG TERM GOAL #1   Title Pt will be independent with advanced HEP    Time 6    Period Weeks    Status New    Target Date 10/21/20      PT LONG TERM GOAL #2   Title Pt will report >50% improvement with pain    Time 6    Period Weeks    Status New    Target Date 10/21/20      PT LONG TERM GOAL #3   Title Pt will be able to lift at least 20 lbs with good mechanics so she can lift her grandchild with less pain    Baseline "unable to lift more than 5 lbs"    Time 6    Period Weeks    Status New    Target Date 10/21/20      PT LONG TERM GOAL #4   Title Pt will be able to tolerate sitting at least 15 minutes    Time 6    Period Weeks    Status New    Target Date 10/21/20                   Plan - 09/19/20 1500     Clinical Impression Statement PT assessment and treatment was completed for directional preference for reduction of low back pain. Prone lying, prone on pillows, and prone press ups resolved pt's L low back pain to 0/10. Additionally, with the use of a towel roll for lumbar  support, pt reported a reduction in L lowback pain.    Personal Factors and Comorbidities Age;Time since onset of injury/illness/exacerbation;Comorbidity 1;Fitness;Past/Current Experience    Comorbidities Arthritis, Asthma, Bladder disease, COPD , Depression, GERD, Hyperlipidemia, Hypertension, and Peptic ulcer.    Examination-Activity  Limitations Transfers;Sit;Carry;Caring for Others;Squat;Stairs;Lift    Examination-Participation Restrictions Cleaning;Community Activity;Laundry;Meal Prep    Stability/Clinical Decision Making Evolving/Moderate complexity    Clinical Decision Making Moderate    Rehab Potential Good    PT Frequency --   2x/wk for 3 wks followed with 1x/wk for additional 3 wks   PT Duration 6 weeks    PT Treatment/Interventions ADLs/Self Care Home Management;Aquatic Therapy;Cryotherapy;Electrical Stimulation;Iontophoresis 4mg /ml Dexamethasone;Moist Heat;Traction;Gait training;Stair training;Functional mobility training;Therapeutic activities;Therapeutic exercise;Balance training;Neuromuscular re-education;Manual techniques;Patient/family education;Passive range of motion;Dry needling;Taping;Vasopneumatic Device;Spinal Manipulations;Joint Manipulations    PT Next Visit Plan Assess response to prone prress ups. Manual therapy and TPDN as pt tolerates.    PT Home Exercise Plan Access Code: [email or web address]    [email or web address] and Agree with Plan of Care Patient             Patient will benefit from skilled therapeutic intervention in order to improve the following deficits and impairments:  Increased fascial restricitons, Cardiopulmonary status limiting activity, Increased muscle spasms, Decreased activity tolerance, Pain, Improper body mechanics, Decreased strength, Decreased mobility, Postural dysfunction  Visit Diagnosis: Muscle weakness (generalized)  Chronic left-sided low back pain without sciatica  Pain in left hip     Problem List Patient Active Problem List   Diagnosis Date Noted   Physical deconditioning 04/07/2020   Dyspnea 02/05/2019   Chronic respiratory failure with hypoxia (HCC) 02/05/2019   Daytime sleepiness 02/05/2019   Pelvic relaxation due to cystocele, midline 01/16/2019   Emphysema of lung ([place]) 06/14/2018   Aortic atherosclerosis ([place]) 06/14/2018   Atherosclerosis of coronary artery  06/14/2018   Thoracic aortic ectasia ([place]) 05/19/2018   Esophageal dysphagia 03/01/2018   Family history of colon cancer 03/01/2018   Diarrhea 03/01/2018   Near syncope 10/17/2017   Hyperlipidemia 10/17/2017   GERD (gastroesophageal reflux disease) 10/17/2017   Bladder disease or syndrome 10/17/2017   Depression 10/17/2017   Hypertension, essential, benign 02/19/2017   COPD (chronic obstructive pulmonary disease) ([place]) 02/11/2017   Chest pain 02/12/2015   Referral of patient 02/12/2015    Gar Ponto MS, PT 09/19/20 3:27 PM   [facility] [address] [place], [place], [address] Phone: [phone]   Fax:  [phone]  Name: SALLY-ANNE WAMBLE MRN: [id number] Date of Birth: [birth year]

## 2020-09-22 ENCOUNTER — Other Ambulatory Visit: Payer: Self-pay

## 2020-09-23 ENCOUNTER — Ambulatory Visit: Payer: Medicaid Other

## 2020-09-23 ENCOUNTER — Other Ambulatory Visit: Payer: Self-pay

## 2020-09-23 DIAGNOSIS — M545 Low back pain, unspecified: Secondary | ICD-10-CM

## 2020-09-23 DIAGNOSIS — M25552 Pain in left hip: Secondary | ICD-10-CM

## 2020-09-23 DIAGNOSIS — M6281 Muscle weakness (generalized): Secondary | ICD-10-CM

## 2020-09-24 NOTE — Therapy (Addendum)
[place], [place], [address] Phone: [phone]   Fax:  [phone]  Physical Therapy Treatment/Discharge  Patient Details  Name: Monica Scott MRN: [id number] Date of Birth: Jun 10, 1955 Referring Provider (PT): Eunice Blase, MD   Encounter Date: 09/23/2020   PT End of Session - 09/24/20 1019     Visit Number 3    Number of Visits 10    Date for PT Re-Evaluation 10/21/20    Authorization Type Medicaid -- Needs initial 3 visit auth    Authorization - Visit Number 2    Authorization - Number of Visits 3    PT Start Time 0848    PT Stop Time 0933    PT Time Calculation (min) 45 min    Activity Tolerance Patient tolerated treatment well    Behavior During Therapy [facility] for tasks assessed/performed             Past Medical History:  Diagnosis Date   Arthritis    Asthma    Bladder disease    COPD (chronic obstructive pulmonary disease) ([place])    Depression    GERD (gastroesophageal reflux disease)    Hyperlipidemia    Hypertension    Peptic ulcer     Past Surgical History:  Procedure Laterality Date   ABDOMINAL HYSTERECTOMY     BIOPSY  04/24/2018   Procedure: BIOPSY;  Surgeon: Daneil Dolin, MD;  [facility];  Service: Endoscopy;;  esophagus   COLONOSCOPY WITH PROPOFOL N/A 04/24/2018   Procedure: COLONOSCOPY WITH PROPOFOL;  Surgeon: Daneil Dolin, MD;  [facility];  Service: Endoscopy;  Laterality: N/A;  9:00am   ESOPHAGOGASTRODUODENOSCOPY (EGD) WITH PROPOFOL N/A 04/24/2018   Procedure: ESOPHAGOGASTRODUODENOSCOPY (EGD) WITH PROPOFOL;  Surgeon: Daneil Dolin, MD;  [facility];  Service: Endoscopy;  Laterality: N/A;   MALONEY DILATION N/A 04/24/2018   Procedure: Venia Minks DILATION;  Surgeon: Daneil Dolin, MD;  [facility];  Service: Endoscopy;  Laterality: N/A;   PILONIDAL CYST EXCISION     age 65   POLYPECTOMY  04/24/2018   Procedure:  POLYPECTOMY;  Surgeon: Daneil Dolin, MD;  [facility];  Service: Endoscopy;;  colon     There were no vitals filed for this visit.   Subjective Assessment - 09/23/20 0856     Subjective Pt reports she is quite a bit better since the last PT session. Has the most difficulty with pain c sit to stand and when she first gets up in the AM. Pt reports she is not going to continue with PT services after this session. Pt reports intermittent episode of pain which can be brief or longer in duration.    Pertinent History Arthritis, Asthma, Bladder disease, COPD , Depression, GERD, Hyperlipidemia, Hypertension, and Peptic ulcer    Diagnostic tests MRI: Given left hip symptoms, dominant findings are at L2-3 where a left  paracentral and subarticular recess protrusion indents the thecal  sac and impinges on the left L3 root.    Patient Stated Goals Ease the pain    Currently in Pain? No/denies    Pain Score 0-No pain    Pain Location Back    Pain Orientation Left;Lower    Pain Descriptors / Indicators Stabbing    Pain Type Chronic pain    Pain Onset More than a month ago    Pain Frequency Intermittent    Aggravating Factors  time sitting, lifting  Pain Relieving Factors Ice, ex                               [facility] Adult PT Treatment/Exercise - 09/24/20 0001       Exercises   Exercises Lumbar      Lumbar Exercises: Seated   Long Arc Quad on Chair Right;Left;10 reps    LAQ on Chair Limitations c ankle DF      Lumbar Exercises: Supine   Pelvic Tilt 15 reps    Pelvic Tilt Limitations c arm reaches to engage core      Lumbar Exercises: Prone   Straight Leg Raise 10 reps    Straight Leg Raises Limitations alt legs, 10x each    Other Prone Lumbar Exercises Press ups 10x    Other Prone Lumbar Exercises H arm lifts, 15x                    PT Education - 09/24/20 1019     Education Details Updated for final HEP. Pt was instructed in lunges and  golfer lifts for proper body mechanics with vacuuming, lifting, and removing clothes from a washing machine. Pt returned demonstration.    Person(s) Educated Patient    Methods Explanation;Demonstration;Tactile cues;Handout;Verbal cues    Comprehension Verbalized understanding;Returned demonstration;Verbal cues required;Tactile cues required              PT Short Term Goals - 09/24/20 1027       PT SHORT TERM GOAL #1   Title Pt will be independent with initial HEP    Time 3    Period Weeks    Status Achieved    Target Date 09/23/20      PT SHORT TERM GOAL #2   Title Pt will report at least 25% improvement with her pain    Time 3    Period Weeks    Status Achieved    Target Date 09/23/20      PT SHORT TERM GOAL #3   Title Pt will improve 5x STS <10 sec to demo improved LE functional strength. Pt discontinued PT after 3 sessions.    Time 3    Period Weeks    Status Deferred    Target Date 09/23/20               PT Long Term Goals - 09/24/20 1029       PT LONG TERM GOAL #1   Title Pt will be independent with advanced HEP. 09/23/20: Pt discontinued PT after 3 sessions.    Time 6    Period Weeks    Status Deferred    Target Date 09/23/20      PT LONG TERM GOAL #2   Title Pt will report >50% improvement with pain. 09/23/20: Pt discontinued PT after 3 sessions.    Time 6    Period Weeks    Status Deferred    Target Date 09/23/20      PT LONG TERM GOAL #3   Title Pt will be able to lift at least 20 lbs with good mechanics so she can lift her grandchild with less pain. 09/23/20: Pt discontinued PT after 3 sessions.    Baseline "unable to lift more than 5 lbs"    Time 6    Period Weeks    Status Deferred    Target Date 09/23/20      PT LONG TERM GOAL #4  Title Pt will be able to tolerate sitting at least 15 minutes. 09/23/20: Pt discontinued PT after 3 sessions.    Time 6    Period Weeks    Status New    Target Date 09/23/20                    Plan - 09/23/20 0914     Clinical Impression Statement Pt reports good response to the prone press up exs with reduced pain. Sge states she is completing 4+ times a day. PT was completed today for strengthening exs for the back and abdominals, as well as proper body mechanics for household activites. Pt returned demonstration for both the exs and body mechanics. Following today's session, pt stated she decided not to continue with additional treatment sessions. Pt is DCed reporting her pain level is a "good bit better" and Ind in a HEP.    Personal Factors and Comorbidities Age;Time since onset of injury/illness/exacerbation;Comorbidity 1;Fitness;Past/Current Experience    Comorbidities Arthritis, Asthma, Bladder disease, COPD , Depression, GERD, Hyperlipidemia, Hypertension, and Peptic ulcer.    Examination-Activity Limitations Transfers;Sit;Carry;Caring for Others;Squat;Stairs;Lift    Examination-Participation Restrictions Cleaning;Community Activity;Laundry;Meal Prep    Stability/Clinical Decision Making Evolving/Moderate complexity    Clinical Decision Making Moderate    Rehab Potential Good    PT Frequency --   2x/wk for 3 wks followed with 1x/wk for additional 3 wks   PT Duration 6 weeks    PT Treatment/Interventions ADLs/Self Care Home Management;Aquatic Therapy;Cryotherapy;Electrical Stimulation;Iontophoresis 79m/ml Dexamethasone;Moist Heat;Traction;Gait training;Stair training;Functional mobility training;Therapeutic activities;Therapeutic exercise;Balance training;Neuromuscular re-education;Manual techniques;Patient/family education;Passive range of motion;Dry needling;Taping;Vasopneumatic Device;Spinal Manipulations;Joint Manipulations    PT Home Exercise Plan Access Code: [email or web address]    WCHENIDPOand Agree with Plan of Care Patient             Patient will benefit from skilled therapeutic intervention in order to improve the following deficits and impairments:  Increased fascial  restricitons, Cardiopulmonary status limiting activity, Increased muscle spasms, Decreased activity tolerance, Pain, Improper body mechanics, Decreased strength, Decreased mobility, Postural dysfunction  Visit Diagnosis: Muscle weakness (generalized)  Chronic left-sided low back pain without sciatica  Pain in left hip     Problem List Patient Active Problem List   Diagnosis Date Noted   Physical deconditioning 04/07/2020   Dyspnea 02/05/2019   Chronic respiratory failure with hypoxia (H[facility]) 02/05/2019   Daytime sleepiness 02/05/2019   Pelvic relaxation due to cystocele, midline 01/16/2019   Emphysema of lung ([facility] 06/14/2018   Aortic atherosclerosis ([facility] 06/14/2018   Atherosclerosis of coronary artery 06/14/2018   Thoracic aortic ectasia ([facility] 05/19/2018   Esophageal dysphagia 03/01/2018   Family history of colon cancer 03/01/2018   Diarrhea 03/01/2018   Near syncope 10/17/2017   Hyperlipidemia 10/17/2017   GERD (gastroesophageal reflux disease) 10/17/2017   Bladder disease or syndrome 10/17/2017   Depression 10/17/2017   Hypertension, essential, benign 02/19/2017   COPD (chronic obstructive pulmonary disease) ([facility] [place] 02/11/2017   Chest pain 02/12/2015   Referral of patient 02/12/2015    PHYSICAL THERAPY DISCHARGE SUMMARY  Visits from Start of Care: 2  Current functional level related to goals / functional outcomes: See above   Remaining deficits: See above   Education / Equipment: HEP  Patient agrees to discharge. Patient goals were partially met. Patient is being discharged due to  pt's request and with pt reporting improvement in her back pain.  AGar PontoMS, PT 09/24/20 10:49 AM   [facility]  [place], [place], [address] Phone: [phone]   Fax:  [phone]  Name: REILYNN LAURO MRN: [id number] Date of Birth: July 13, 1955

## 2020-09-30 ENCOUNTER — Encounter: Payer: Medicaid Other | Admitting: Physical Therapy

## 2020-09-30 ENCOUNTER — Other Ambulatory Visit: Payer: Self-pay | Admitting: Adult Health

## 2020-10-05 ENCOUNTER — Other Ambulatory Visit: Payer: Self-pay | Admitting: Pulmonary Disease

## 2020-10-07 ENCOUNTER — Encounter: Payer: Medicaid Other | Admitting: Physical Therapy

## 2020-10-14 ENCOUNTER — Encounter: Payer: Medicaid Other | Admitting: Physical Therapy

## 2020-10-15 ENCOUNTER — Other Ambulatory Visit: Payer: Self-pay

## 2020-10-15 DIAGNOSIS — J449 Chronic obstructive pulmonary disease, unspecified: Secondary | ICD-10-CM

## 2020-10-15 MED ORDER — INCRUSE ELLIPTA 62.5 MCG/INH IN AEPB: 2.0000 | INHALATION_SPRAY | Freq: Every day | RESPIRATORY_TRACT | 3 refills | Status: DC

## 2020-10-15 NOTE — Telephone Encounter (Signed)
Patients insurance prefers incruse over spiriva.  New rx sent per Dr Elsworth Soho.  Left message informing patient.

## 2020-10-23 ENCOUNTER — Other Ambulatory Visit: Payer: Self-pay

## 2020-10-23 NOTE — Progress Notes (Signed)
Per Dr. Elsworth Soho patient should be on Spiriva, Dulera and Duoneb

## 2020-11-03 ENCOUNTER — Other Ambulatory Visit: Payer: Self-pay | Admitting: Family Medicine

## 2020-11-04 ENCOUNTER — Other Ambulatory Visit: Payer: Self-pay | Admitting: Family Medicine

## 2020-11-04 MED ORDER — CYCLOBENZAPRINE HCL 5 MG PO TABS: 5.0000 mg | ORAL_TABLET | Freq: Three times a day (TID) | ORAL | 1 refills | Status: DC | PRN

## 2020-11-07 ENCOUNTER — Telehealth: Payer: Self-pay | Admitting: Nurse Practitioner

## 2020-11-07 NOTE — Telephone Encounter (Signed)
Pt is calling because she having pain in her love handles Pt would like to know can she drop a urine sample off - No available appts until 11/10/20. CB- [phone]

## 2020-11-09 ENCOUNTER — Emergency Department (HOSPITAL_BASED_OUTPATIENT_CLINIC_OR_DEPARTMENT_OTHER)
Admission: EM | Admit: 2020-11-09 | Discharge: 2020-11-09 | Disposition: A | Discharge status: Home / Self Care | Payer: Medicare Other | Attending: Emergency Medicine | Admitting: Emergency Medicine

## 2020-11-09 ENCOUNTER — Emergency Department (HOSPITAL_BASED_OUTPATIENT_CLINIC_OR_DEPARTMENT_OTHER): Payer: Medicare Other | Admitting: Radiology

## 2020-11-09 ENCOUNTER — Other Ambulatory Visit: Payer: Self-pay

## 2020-11-09 ENCOUNTER — Encounter (HOSPITAL_BASED_OUTPATIENT_CLINIC_OR_DEPARTMENT_OTHER): Payer: Self-pay

## 2020-11-09 DIAGNOSIS — Z79899 Other long term (current) drug therapy: Secondary | ICD-10-CM | POA: Diagnosis not present

## 2020-11-09 DIAGNOSIS — E876 Hypokalemia: Secondary | ICD-10-CM | POA: Diagnosis not present

## 2020-11-09 DIAGNOSIS — E871 Hypo-osmolality and hyponatremia: Secondary | ICD-10-CM | POA: Diagnosis not present

## 2020-11-09 DIAGNOSIS — B9689 Other specified bacterial agents as the cause of diseases classified elsewhere: Secondary | ICD-10-CM | POA: Diagnosis not present

## 2020-11-09 DIAGNOSIS — I10 Essential (primary) hypertension: Secondary | ICD-10-CM | POA: Diagnosis not present

## 2020-11-09 DIAGNOSIS — M546 Pain in thoracic spine: Secondary | ICD-10-CM | POA: Diagnosis present

## 2020-11-09 DIAGNOSIS — J45909 Unspecified asthma, uncomplicated: Secondary | ICD-10-CM | POA: Diagnosis not present

## 2020-11-09 DIAGNOSIS — Z87891 Personal history of nicotine dependence: Secondary | ICD-10-CM | POA: Diagnosis not present

## 2020-11-09 DIAGNOSIS — J449 Chronic obstructive pulmonary disease, unspecified: Secondary | ICD-10-CM | POA: Insufficient documentation

## 2020-11-09 DIAGNOSIS — N10 Acute tubulo-interstitial nephritis: Secondary | ICD-10-CM | POA: Insufficient documentation

## 2020-11-09 LAB — COMPREHENSIVE METABOLIC PANEL
ALT: 25 U/L (ref 0–44)
AST: 23 U/L (ref 15–41)
Albumin: 3.6 g/dL (ref 3.5–5.0)
Alkaline Phosphatase: 95 U/L (ref 38–126)
Anion gap: 11 (ref 5–15)
BUN: 6 mg/dL — ABNORMAL LOW (ref 8–23)
CO2: 28 mmol/L (ref 22–32)
Calcium: 8.7 mg/dL — ABNORMAL LOW (ref 8.9–10.3)
Chloride: 95 mmol/L — ABNORMAL LOW (ref 98–111)
Creatinine, Ser: 0.77 mg/dL (ref 0.44–1.00)
GFR, Estimated: >60 mL/min (ref >60)
Glucose, Bld: 154 mg/dL — ABNORMAL HIGH (ref 70–99)
Potassium: 3.2 mmol/L — ABNORMAL LOW (ref 3.5–5.1)
Sodium: 134 mmol/L — ABNORMAL LOW (ref 135–145)
Total Bilirubin: 0.5 mg/dL (ref 0.3–1.2)
Total Protein: 6.5 g/dL (ref 6.5–8.1)

## 2020-11-09 LAB — CBC WITH DIFFERENTIAL/PLATELET
Abs Immature Granulocytes: 0.03 10*3/uL (ref 0.00–0.07)
Basophils Absolute: 0.1 10*3/uL (ref 0.0–0.1)
Basophils Relative: 1 %
Eosinophils Absolute: 0.1 10*3/uL (ref 0.0–0.5)
Eosinophils Relative: 1 %
HCT: 34.5 % — ABNORMAL LOW (ref 36.0–46.0)
Hemoglobin: 11.4 g/dL — ABNORMAL LOW (ref 12.0–15.0)
Immature Granulocytes: 0 %
Lymphocytes Relative: 18 %
Lymphs Abs: 2.1 10*3/uL (ref 0.7–4.0)
MCH: 28.9 pg (ref 26.0–34.0)
MCHC: 33 g/dL (ref 30.0–36.0)
MCV: 87.6 fL (ref 80.0–100.0)
Monocytes Absolute: 1 10*3/uL (ref 0.1–1.0)
Monocytes Relative: 8 %
Neutro Abs: 8.7 10*3/uL — ABNORMAL HIGH (ref 1.7–7.7)
Neutrophils Relative %: 72 %
Platelets: 362 10*3/uL (ref 150–400)
RBC: 3.94 MIL/uL (ref 3.87–5.11)
RDW: 14.1 % (ref 11.5–15.5)
WBC: 12 10*3/uL — ABNORMAL HIGH (ref 4.0–10.5)
nRBC: 0 % (ref 0.0–0.2)

## 2020-11-09 LAB — URINALYSIS, ROUTINE W REFLEX MICROSCOPIC
Bilirubin Urine: NEGATIVE
Glucose, UA: NEGATIVE mg/dL
Hgb urine dipstick: NEGATIVE
Ketones, ur: NEGATIVE mg/dL
Nitrite: POSITIVE — AB
Protein, ur: NEGATIVE mg/dL
Specific Gravity, Urine: 1.011 (ref 1.005–1.030)
pH: 5.5 (ref 5.0–8.0)

## 2020-11-09 MED ORDER — ONDANSETRON HCL 4 MG/2ML IJ SOLN
4.0000 mg | Freq: Once | INTRAMUSCULAR | Status: AC
  Administered 2020-11-09: 4 mg via INTRAVENOUS
  Filled 2020-11-09: qty 2

## 2020-11-09 MED ORDER — CEFDINIR 300 MG PO CAPS: 300.0000 mg | ORAL_CAPSULE | Freq: Two times a day (BID) | ORAL | 0 refills | Status: DC

## 2020-11-09 MED ORDER — SODIUM CHLORIDE 0.9 % IV SOLN
1.0000 g | Freq: Once | INTRAVENOUS | Status: AC
  Administered 2020-11-09: 1 g via INTRAVENOUS
  Filled 2020-11-09: qty 10

## 2020-11-09 MED ORDER — KETOROLAC TROMETHAMINE 15 MG/ML IJ SOLN
7.5000 mg | Freq: Once | INTRAMUSCULAR | Status: AC
  Administered 2020-11-09: 7.5 mg via INTRAVENOUS
  Filled 2020-11-09: qty 1

## 2020-11-09 MED ORDER — POTASSIUM CHLORIDE CRYS ER 20 MEQ PO TBCR
40.0000 meq | EXTENDED_RELEASE_TABLET | Freq: Once | ORAL | Status: AC
  Administered 2020-11-09: 40 meq via ORAL
  Filled 2020-11-09: qty 2

## 2020-11-09 MED ORDER — SODIUM CHLORIDE 0.9 % IV BOLUS
500.0000 mL | Freq: Once | INTRAVENOUS | Status: AC
  Administered 2020-11-09: 500 mL via INTRAVENOUS

## 2020-11-09 MED ORDER — ONDANSETRON 4 MG PO TBDP: ORAL_TABLET | ORAL | 0 refills | Status: DC

## 2020-11-09 NOTE — Discharge Instructions (Addendum)
Take antibiotics as prescribed. Use Zofran every 4 hours as needed for nausea and vomiting. Use Tylenol every 4 hours as needed for fever or pain.

## 2020-11-09 NOTE — ED Provider Notes (Signed)
[facility] EMERG[facility] DEPT Provider Note   CSN: [id number] Arrival date & time: 11/09/20  [id number]     History Chief Complaint  Patient presents with   Flank Pain    Bilateral    Monica Scott is a 65 y.o. female.  Patient with history of COPD, reflux, high blood pressure, peptic ulcer presents with bilateral lower thoracic pain for 3 to 4 days.  Intermittent myalgias, nausea and fever on Wednesday.  Fever resolved on Friday.  Patient's had kidney/urine infection in the past but has been sometime.  Patient has chronic lower back pain lumbar region but this is different.  No injuries.  No vomiting.  No upper back pain, no chest pain, no shortness of breath.  No weakness or numbness in the legs.  No history of kidney stones.  Pain constant, no colicky component.      Past Medical History:  Diagnosis Date   Arthritis    Asthma    Bladder disease    COPD (chronic obstructive pulmonary disease) ([place])    Depression    GERD (gastroesophageal reflux disease)    Hyperlipidemia    Hypertension    Peptic ulcer     Patient Active Problem List   Diagnosis Date Noted   Physical deconditioning 04/07/2020   Dyspnea 02/05/2019   Chronic respiratory failure with hypoxia ([place]) 02/05/2019   Daytime sleepiness 02/05/2019   Pelvic relaxation due to cystocele, midline 01/16/2019   Emphysema of lung ([place]) 06/14/2018   Aortic atherosclerosis ([place]) 06/14/2018   Atherosclerosis of coronary artery 06/14/2018   Thoracic aortic ectasia ([place]) 05/19/2018   Esophageal dysphagia 03/01/2018   Family history of colon cancer 03/01/2018   Diarrhea 03/01/2018   Near syncope 10/17/2017   Hyperlipidemia 10/17/2017   GERD (gastroesophageal reflux disease) 10/17/2017   Bladder disease or syndrome 10/17/2017   Depression 10/17/2017   Hypertension, essential, benign 02/19/2017   COPD (chronic obstructive pulmonary disease) ([place]) 02/11/2017   Chest pain 02/12/2015   Referral of patient  02/12/2015    Past Surgical History:  Procedure Laterality Date   ABDOMINAL HYSTERECTOMY     BIOPSY  04/24/2018   Procedure: BIOPSY;  Surgeon: Daneil Dolin, MD;  [facility];  Service: Endoscopy;;  esophagus   COLONOSCOPY WITH PROPOFOL N/A 04/24/2018   Procedure: COLONOSCOPY WITH PROPOFOL;  Surgeon: Daneil Dolin, MD;  [facility];  Service: Endoscopy;  Laterality: N/A;  9:00am   ESOPHAGOGASTRODUODENOSCOPY (EGD) WITH PROPOFOL N/A 04/24/2018   Procedure: ESOPHAGOGASTRODUODENOSCOPY (EGD) WITH PROPOFOL;  Surgeon: Daneil Dolin, MD;  [facility];  Service: Endoscopy;  Laterality: N/A;   MALONEY DILATION N/A 04/24/2018   Procedure: Venia Minks DILATION;  Surgeon: Daneil Dolin, MD;  [facility];  Service: Endoscopy;  Laterality: N/A;   PILONIDAL CYST EXCISION     age 78   POLYPECTOMY  04/24/2018   Procedure: POLYPECTOMY;  Surgeon: Daneil Dolin, MD;  [facility];  Service: Endoscopy;;  colon      OB History   No obstetric history on file.     Family History  Problem Relation Age of Onset   Alcohol abuse Mother    Arthritis Mother    Cancer Mother    Depression Mother    Early death Mother    Alcohol abuse Father    Depression Father    Alcohol abuse Sister    Depression Sister    Alcohol abuse Brother    Alcohol abuse Daughter  Depression Daughter    Alcohol abuse Son    COPD Maternal Grandfather    Heart disease Maternal Grandfather    Hypertension Maternal Grandfather    Colon cancer Brother        deceased at age 34    Social History   Tobacco Use   Smoking status: Former    Packs/day: 1.50    Years: 40.00    Pack years: 60.00    Types: Cigarettes    Quit date: 04/19/2010    Years since quitting: 10.5   Smokeless tobacco: Never  Vaping Use   Vaping Use: Never used  Substance Use Topics   Alcohol use: No    Comment: Former ETOH. none since 2000   Drug use: No    Home Medications Prior to  Admission medications   Medication Sig Start Date End Date Taking? Authorizing Provider  cefdinir (OMNICEF) 300 MG capsule Take 1 capsule (300 mg total) by mouth 2 (two) times daily. 11/10/20  Yes Elnora Morrison, MD  ondansetron (ZOFRAN ODT) 4 MG disintegrating tablet '4mg'$  ODT q4 hours prn nausea/vomit 11/09/20  Yes Elnora Morrison, MD  albuterol (PROVENTIL) (2.5 MG/3ML) 0.083% nebulizer solution Take 3 mLs (2.5 mg total) by nebulization every 6 (six) hours as needed for wheezing or shortness of breath. 10/08/19   Rigoberto Noel, MD  albuterol (VENTOLIN HFA) 108 (90 Base) MCG/ACT inhaler Inhale 1-2 puffs into the lungs every 6 (six) hours as needed for wheezing or shortness of breath. 10/03/19   Rigoberto Noel, MD  amLODipine (NORVASC) 5 MG tablet Take 1 tablet by mouth twice daily 08/08/20   Gildardo Pounds, NP  atorvastatin (LIPITOR) 20 MG tablet Take 1 tablet (20 mg total) by mouth daily. 06/08/20   Gildardo Pounds, NP  baclofen (LIORESAL) 10 MG tablet Take 0.5-1 tablets (5-10 mg total) by mouth 3 (three) times daily as needed for muscle spasms. 07/25/20   Hilts, Legrand Como, MD  buPROPion (WELLBUTRIN XL) 150 MG 24 hr tablet Take 150 mg by mouth at bedtime. 03/02/20   [provider]  cyclobenzaprine (FLEXERIL) 5 MG tablet Take 1 tablet (5 mg total) by mouth 3 (three) times daily as needed for muscle spasms. 11/04/20   Hilts, Legrand Como, MD  FLUoxetine (PROZAC) 20 MG capsule Take 60 mg by mouth daily. 05/14/20   [provider]  gabapentin (NEURONTIN) 400 MG capsule Take 1 capsule (400 mg total) by mouth 3 (three) times daily. 12/06/17   Fulp, Cammie, MD  ibuprofen (ADVIL,MOTRIN) 200 MG tablet Take 400 mg by mouth 3 (three) times daily as needed for moderate pain.    [provider]  ipratropium-albuterol (DUONEB) 0.5-2.5 (3) MG/3ML SOLN USE 1 AMPULE IN NEBULIZER EVERY 6 HOURS AS NEEDED 10/06/20   Rigoberto Noel, MD  loperamide (IMODIUM A-D) 2 MG tablet Take 4 mg by mouth 2 (two) times daily  as needed for diarrhea or loose stools.    [provider]  methocarbamol (ROBAXIN) 750 MG tablet Take 1 tablet (750 mg total) by mouth every 6 (six) hours as needed for muscle spasms. 08/28/20   Hilts, Legrand Como, MD  mometasone-formoterol (DULERA) 100-5 MCG/ACT AERO Inhale 2 puffs into the lungs in the morning and at bedtime. 03/20/20   Parrett, Fonnie Mu, NP  OLANZapine (ZYPREXA) 5 MG tablet Take 10 mg by mouth at bedtime.     [provider]  olmesartan (BENICAR) 40 MG tablet TAKE 1 TABLET BY MOUTH EVERY DAY 09/30/20   Parrett, Fonnie Mu,  NP  omeprazole (PRILOSEC) 40 MG capsule TAKE 1 CAPSULE (40 MG TOTAL) BY MOUTH 2 (TWO) TIMES DAILY. TO REDUCE STOMACH ACID 07/23/20 07/23/21  Charlott Rakes, MD  Tiotropium Bromide Monohydrate (SPIRIVA RESPIMAT) 2.5 MCG/ACT AERS Inhale 2 puffs into the lungs daily. 08/12/20   Rigoberto Noel, MD  traZODone (DESYREL) 100 MG tablet Take 100 mg by mouth at bedtime as needed. 06/27/20   [provider]    Allergies    Tizanidine  Review of Systems   Review of Systems  Constitutional:  Negative for chills and fever.  HENT:  Negative for congestion.   Eyes:  Negative for visual disturbance.  Respiratory:  Negative for shortness of breath.   Cardiovascular:  Negative for chest pain.  Gastrointestinal:  Positive for nausea. Negative for abdominal pain and vomiting.  Genitourinary:  Positive for flank pain. Negative for dysuria.  Musculoskeletal:  Positive for back pain. Negative for neck pain and neck stiffness.  Skin:  Negative for rash.  Neurological:  Negative for light-headedness and headaches.   Physical Exam Updated Vital Signs BP 96/86 (BP Location: Left Arm)   Pulse 79   Temp 98.3 F (36.8 C) (Oral)   Resp 18   Ht '4\' 11"'$  (1.499 m)   Wt 74.8 kg   SpO2 95%   BMI 33.33 kg/m   Physical Exam Vitals and nursing note reviewed.  Constitutional:      General: She is not in acute distress.    Appearance: She is well-developed.  HENT:      Head: Normocephalic and atraumatic.     Mouth/Throat:     Mouth: Mucous membranes are moist.  Eyes:     General:        Right eye: No discharge.        Left eye: No discharge.     Conjunctiva/sclera: Conjunctivae normal.  Neck:     Trachea: No tracheal deviation.  Cardiovascular:     Rate and Rhythm: Normal rate.  Pulmonary:     Effort: Pulmonary effort is normal.  Abdominal:     General: There is no distension.     Palpations: Abdomen is soft.     Tenderness: There is abdominal tenderness (flank bilateral). There is no guarding.  Musculoskeletal:        General: No swelling.     Cervical back: Normal range of motion and neck supple. No rigidity.  Skin:    General: Skin is warm.     Capillary Refill: Capillary refill takes less than 2 seconds.     Findings: No rash.  Neurological:     General: No focal deficit present.     Mental Status: She is alert.     Cranial Nerves: No cranial nerve deficit.     Motor: No weakness.  Psychiatric:        Mood and Affect: Mood normal.    ED Results / Procedures / Treatments   Labs (all labs ordered are listed, but only abnormal results are displayed) Labs Reviewed  URINALYSIS, ROUTINE W REFLEX MICROSCOPIC - Abnormal; Notable for the following components:      Result Value   Nitrite POSITIVE (*)    Leukocytes,Ua MODERATE (*)    Bacteria, UA MANY (*)    All other components within normal limits  CBC WITH DIFFERENTIAL/PLATELET - Abnormal; Notable for the following components:   WBC 12.0 (*)    Hemoglobin 11.4 (*)    HCT 34.5 (*)    Neutro Abs 8.7 (*)  All other components within normal limits  COMPREHENSIVE METABOLIC PANEL - Abnormal; Notable for the following components:   Sodium 134 (*)    Potassium 3.2 (*)    Chloride 95 (*)    Glucose, Bld 154 (*)    BUN 6 (*)    Calcium 8.7 (*)    All other components within normal limits  URINE CULTURE    EKG None  Radiology DG Chest 2 View  Result Date:  11/09/2020 CLINICAL DATA:  65 year old female with fever for 3 days. EXAM: CHEST - 2 VIEW COMPARISON:  Chest CT 03/26/2020 and earlier. FINDINGS: Stable lung volumes and mediastinal contours. Chronic lipomatosis and/or architectural distortion in the lingula. No convincing acute pulmonary opacity. No pneumothorax or pleural effusion. Emphysema demonstrated by CT last year. Calcified aortic atherosclerosis. No acute osseous abnormality identified. Negative visible bowel gas pattern. IMPRESSION: Emphysema (ICD10-J43.9) and Aortic Atherosclerosis (ICD10-I70.0). No acute cardiopulmonary abnormality. Electronically Signed   By: Genevie Ann M.D.   On: 11/09/2020 07:34    Procedures Procedures   Medications Ordered in ED Medications  ketorolac (TORADOL) 15 MG/ML injection 7.5 mg (7.5 mg Intravenous Given 11/09/20 0655)  cefTRIAXone (ROCEPHIN) 1 g in sodium chloride 0.9 % 100 mL IVPB (0 g Intravenous Stopped 11/09/20 0813)  ondansetron (ZOFRAN) injection 4 mg (4 mg Intravenous Given 11/09/20 0738)  sodium chloride 0.9 % bolus 500 mL (0 mLs Intravenous Stopped 11/09/20 0813)  potassium chloride SA (KLOR-CON) CR tablet 40 mEq (40 mEq Oral Given 11/09/20 0818)    ED Course  I have reviewed the triage vital signs and the nursing notes.  Pertinent labs & imaging results that were available during my care of the patient were reviewed by me and considered in my medical decision making (see chart for details).    MDM Rules/Calculators/A&P                           Patient presents with lower thoracic back pain without injury and different than her normal chronic back pain with differential including new musculoskeletal, pyelonephritis, kidney stone, other.  Neurologically patient at baseline.  Mild tachycardia in the ER, afebrile.  Patient is high risk with intermittent cath and urinalysis reviewed showing positive nitrate, elevated white blood cells and bacteria.  Blood work showed mild leukocytosis consistent with  her infection at 12,000.  Mild hyponatremia and hypokalemia 3.2. Chest x-ray reviewed no infiltrate, chronic emphysema.  IV fluids, oral potassium, nausea meds ordered. Rocephin IV and urine culture.  Discussed close follow-up and plan for oral antibiotics and Zofran for outpatient.   Final diagnoses:  Thoracic back pain  Hyponatremia  Hypokalemia  Acute pyelonephritis due to bacteria    Rx / DC Orders ED Discharge Orders          Ordered    cefdinir (OMNICEF) 300 MG capsule  2 times daily        11/09/20 0917    ondansetron (ZOFRAN ODT) 4 MG disintegrating tablet        11/09/20 [id number]             Elnora Morrison, MD 11/09/20 [phone]

## 2020-11-09 NOTE — ED Provider Notes (Signed)
MSE was initiated and I personally evaluated the patient and placed orders (if any) at  6:43 AM on November 09, 2020.  HPI:  Monica Scott is a 65 y.o. female presents to the emergency department with complaints of bilateral thoracic back pain x 3-4 days. Had a fever on Wed with myalgias. Fever resolved on Fri.  Patient self caths due to neurogenic bladder. Does not get frequent UTIs.  Has h/o chronic LBP with L3 compression. This pain is different.  ROS:  Positive: nausea Negative: cough, chest pain, sob, abd pain, vomiting, diarrhea,   PE:  BP (!) 146/80 (BP Location: Right Arm)   Pulse (!) 113   Temp 98.3 F (36.8 C) (Oral)   Resp 20   Ht '4\' 11"'$  (1.499 m)   Wt 74.8 kg   SpO2 96%   BMI 33.33 kg/m   General: Awake, no distress  HEENT: Atraumatic  Resp: Normal effort  Cardiac: normal rate Abd: Nondistended, nontender  MSK: moves all extremities without difficulty. TTP to bilateral lateral mid thoracic regions Neuro: speech clear   MDM: MSE completed and orders placed as needed.    Discussed with the patient that exiting the department prior to completion of the work-up is AMA and there is no guarantee that there are no emergency medical conditions present. Pt states understanding.    The patient appears stable so that the remainder of the MSE may be completed by another provider.   Fatima Blank, MD 11/09/20 [phone]

## 2020-11-09 NOTE — ED Triage Notes (Signed)
Patient here POV from Home with Bilateral Flank Pain.   Patient states it began 3-4 days PTA and has been worsening since. Patient self-catheterizes and endorses no changes in urine. Moderate Nausea.   Ambulatory. GCS 15. A&Ox4. Fever 3 days PTA (Highest 102). NAD noted during Triage.

## 2020-11-09 NOTE — ED Notes (Signed)
Pt. Requests that I apply her home O2 dose of 3 l.p.m.; which I do.

## 2020-11-11 LAB — URINE CULTURE: Culture: >=100000 — AB

## 2020-11-11 NOTE — Telephone Encounter (Signed)
Pt was called and a VM was left informing patient to return phone call. If patient returns phone call please place patient on nurse schedule.

## 2020-11-12 ENCOUNTER — Telehealth: Payer: Self-pay | Admitting: *Deleted

## 2020-11-12 NOTE — Telephone Encounter (Signed)
Post ED Visit - Positive Culture Follow-up  Culture report reviewed by antimicrobial stewardship pharmacist: [place] Team '[]'$  Elenor Quinones, Pharm.D. '[]'$  Heide Guile, Pharm.D., BCPS AQ-ID '[]'$  Parks Neptune, Pharm.D., BCPS '[]'$  Alycia Rossetti, Pharm.D., BCPS '[]'$  [place], Pharm.D., BCPS, AAHIVP '[]'$  Legrand Como, Pharm.D., BCPS, AAHIVP '[]'$  Salome Arnt, PharmD, BCPS '[]'$  Johnnette Gourd, PharmD, BCPS '[]'$  Hughes Better, PharmD, BCPS '[]'$  Leeroy Cha, PharmD '[]'$  Laqueta Linden, PharmD, BCPS '[]'$  Albertina Parr, PharmD  [place] Team '[]'$  Leodis Sias, PharmD '[]'$  Lindell Spar, PharmD '[]'$  Royetta Asal, PharmD '[]'$  Graylin Shiver, Rph '[]'$  Rema Fendt) Glennon Mac, PharmD '[]'$  Arlyn Dunning, PharmD '[]'$  Netta Cedars, PharmD '[]'$  Dia Sitter, PharmD '[]'$  Leone Haven, PharmD '[]'$  Gretta Arab, PharmD '[]'$  Theodis Shove, PharmD '[]'$  Peggyann Juba, PharmD '[]'$  Reuel Boom, PharmD   Positive urine culture Treated with cefdinir, organism sensitive to the same and no further patient follow-up is required at this time.  A.Lawless  Harlon Flor [facility] 11/12/2020, 12:15 PM

## 2020-11-16 ENCOUNTER — Telehealth: Payer: Self-pay | Admitting: *Deleted

## 2020-11-16 NOTE — Telephone Encounter (Signed)
PA sent in via [facility] for ipratropium-albuterol solution  Key:  [id number] Monica Scott 16-Dec-1955   Will route to LR to follow up on PA

## 2020-11-17 ENCOUNTER — Ambulatory Visit: Payer: Medicare Other | Attending: Nurse Practitioner | Admitting: Nurse Practitioner

## 2020-11-17 ENCOUNTER — Other Ambulatory Visit: Payer: Self-pay

## 2020-11-17 DIAGNOSIS — I700 Atherosclerosis of aorta: Secondary | ICD-10-CM

## 2020-11-17 DIAGNOSIS — I10 Essential (primary) hypertension: Secondary | ICD-10-CM

## 2020-11-17 DIAGNOSIS — N8110 Cystocele, unspecified: Secondary | ICD-10-CM

## 2020-11-17 MED ORDER — OLMESARTAN MEDOXOMIL 40 MG PO TABS: 40.0000 mg | ORAL_TABLET | Freq: Every day | ORAL | 3 refills | Status: DC

## 2020-11-17 MED ORDER — AMLODIPINE BESYLATE 5 MG PO TABS: 5.0000 mg | ORAL_TABLET | Freq: Two times a day (BID) | ORAL | 6 refills | Status: DC

## 2020-11-17 MED ORDER — ATORVASTATIN CALCIUM 20 MG PO TABS: 20.0000 mg | ORAL_TABLET | Freq: Every day | ORAL | 11 refills | Status: DC

## 2020-11-17 NOTE — Progress Notes (Signed)
Virtual Visit via Telephone Note Due to national recommendations of social distancing due to Monica Scott 19, telehealth visit is felt to be most appropriate for this patient at this time.  I discussed the limitations, risks, security and privacy concerns of performing an evaluation and management service by telephone and the availability of in person appointments. I also discussed with the patient that there may be a patient responsible charge related to this service. The patient expressed understanding and agreed to proceed.    I connected with Monica Scott on 11/17/20  at  10:30 AM EDT  EDT by telephone and verified that I am speaking with the correct person using two identifiers.  Location of Patient: Private Residence   Location of Provider: [place] and [employer] Office    Persons participating in Telemedicine visit: [name] FNP-BC Monica Scott    History of Present Illness: Telemedicine visit for: Follow up to HTN She has a past medical history of Arthritis, Asthma, Bladder disease, COPD, Depression, GERD, Hyperlipidemia, Hypertension, and Peptic ulcer.    Bladder Prolapse She states her bladder "stopped working" after her hysterectomy over 20 years ago. She self caths 4-5 times per day. She can feel her bladder near the vaginal introitus. Associated symptoms: vaginal irritation  HTN Home blood pressure reading today: 144/82.  She is taking amlodipine 5 mg BID and benicar 40 mg daily. Denies chest pain, shortness of breath, palpitations, lightheadedness, dizziness, headaches or BLE edema.   BP Readings from Last 3 Encounters:  11/09/20 104/61  08/15/20 101/68  04/07/20 132/76       Past Medical History:  Diagnosis Date   Arthritis    Asthma    Bladder disease    COPD (chronic obstructive pulmonary disease) (HCC)    Depression    GERD (gastroesophageal reflux disease)    Hyperlipidemia    Hypertension    Peptic ulcer     Past Surgical  History:  Procedure Laterality Date   ABDOMINAL HYSTERECTOMY     BIOPSY  04/24/2018   Procedure: BIOPSY;  Surgeon: Daneil Dolin, MD;  [facility];  Service: Endoscopy;;  esophagus   COLONOSCOPY WITH PROPOFOL N/A 04/24/2018   Procedure: COLONOSCOPY WITH PROPOFOL;  Surgeon: Daneil Dolin, MD;  [facility];  Service: Endoscopy;  Laterality: N/A;  9:00am   ESOPHAGOGASTRODUODENOSCOPY (EGD) WITH PROPOFOL N/A 04/24/2018   Procedure: ESOPHAGOGASTRODUODENOSCOPY (EGD) WITH PROPOFOL;  Surgeon: Daneil Dolin, MD;  [facility];  Service: Endoscopy;  Laterality: N/A;   MALONEY DILATION N/A 04/24/2018   Procedure: Venia Minks DILATION;  Surgeon: Daneil Dolin, MD;  [facility];  Service: Endoscopy;  Laterality: N/A;   PILONIDAL CYST EXCISION     age 67   POLYPECTOMY  04/24/2018   Procedure: POLYPECTOMY;  Surgeon: Daneil Dolin, MD;  [facility];  Service: Endoscopy;;  colon     Family History  Problem Relation Age of Onset   Alcohol abuse Mother    Arthritis Mother    Cancer Mother    Depression Mother    Early death Mother    Alcohol abuse Father    Depression Father    Alcohol abuse Sister    Depression Sister    Alcohol abuse Brother    Alcohol abuse Daughter    Depression Daughter    Alcohol abuse Son    COPD Maternal Grandfather    Heart disease Maternal Grandfather    Hypertension Maternal Grandfather  Colon cancer Brother        deceased at age 49    Social History   Socioeconomic History   Marital status: Single    Spouse name: Not on file   Number of children: Not on file   Years of education: Not on file   Highest education level: Not on file  Occupational History   Not on file  Tobacco Use   Smoking status: Former    Packs/day: 1.50    Years: 40.00    Pack years: 60.00    Types: Cigarettes    Quit date: 04/19/2010    Years since quitting: 10.5   Smokeless tobacco: Never  Vaping Use   Vaping Use: Never  used  Substance and Sexual Activity   Alcohol use: No    Comment: Former ETOH. none since 2000   Drug use: No   Sexual activity: Yes    Birth control/protection: Surgical  Other Topics Concern   Not on file  Social History Narrative   Not on file   Social Determinants of Health   Financial Resource Strain: Not on file  Food Insecurity: Not on file  Transportation Needs: Not on file  Physical Activity: Not on file  Stress: Not on file  Social Connections: Not on file     Observations/Objective: Awake, alert and oriented x 3   Review of Systems  Constitutional:  Negative for fever, malaise/fatigue and weight loss.  HENT: Negative.  Negative for nosebleeds.   Eyes: Negative.  Negative for blurred vision, double vision and photophobia.  Respiratory: Negative.  Negative for cough and shortness of breath.   Cardiovascular: Negative.  Negative for chest pain, palpitations and leg swelling.  Gastrointestinal: Negative.  Negative for heartburn, nausea and vomiting.  Genitourinary:        SEE HPI  Musculoskeletal: Negative.  Negative for myalgias.  Neurological: Negative.  Negative for dizziness, focal weakness, seizures and headaches.  Psychiatric/Behavioral: Negative.  Negative for suicidal ideas.    Assessment and Plan: Diagnoses and all orders for this visit:  Essential hypertension -     amLODipine (NORVASC) 5 MG tablet; Take 1 tablet (5 mg total) by mouth 2 (two) times daily. -     olmesartan (BENICAR) 40 MG tablet; Take 1 tablet (40 mg total) by mouth daily. Continue all antihypertensives as prescribed.  Remember to bring in your blood pressure log with you for your follow up appointment.  DASH/Mediterranean Diets are healthier choices for HTN.    Aortic atherosclerosis (HCC) -     atorvastatin (LIPITOR) 20 MG tablet; Take 1 tablet (20 mg total) by mouth daily. INSTRUCTIONS: Work on a low fat, heart healthy diet and participate in regular aerobic exercise program by  working out at least 150 minutes per week; 5 days a week-30 minutes per day. Avoid red meat/beef/steak,  fried foods. junk foods, sodas, sugary drinks, unhealthy snacking, alcohol and smoking.  Drink at least 80 oz of water per day and monitor your carbohydrate intake daily.    Bladder prolapse, female, acquired -     Ambulatory referral to Urogynecology    Follow Up Instructions Return for FASTING labs and Physical.     I discussed the assessment and treatment plan with the patient. The patient was provided an opportunity to ask questions and all were answered. The patient agreed with the plan and demonstrated an understanding of the instructions.   The patient was advised to call back or seek an in-person evaluation if the symptoms  worsen or if the condition fails to improve as anticipated.  I provided 11 minutes of non-face-to-face time during this encounter including median intraservice time, reviewing previous notes, labs, imaging, medications and explaining diagnosis and management.  Gildardo Pounds, FNP-BC

## 2020-11-18 ENCOUNTER — Encounter: Payer: Self-pay | Admitting: Nurse Practitioner

## 2020-11-19 ENCOUNTER — Telehealth: Payer: Self-pay | Admitting: *Deleted

## 2020-11-19 ENCOUNTER — Telehealth: Payer: Self-pay | Admitting: Adult Health

## 2020-11-19 MED ORDER — BUDESONIDE-FORMOTEROL FUMARATE 80-4.5 MCG/ACT IN AERO: 2.0000 | INHALATION_SPRAY | Freq: Two times a day (BID) | RESPIRATORY_TRACT | 12 refills | Status: DC

## 2020-11-19 NOTE — Telephone Encounter (Signed)
May change Dulera to Symbicort 80 2 puffs Twice daily  .   Does she need Spiriva replaced as well ?

## 2020-11-19 NOTE — Telephone Encounter (Signed)
Called and spoke with Patient.  Patient stated Ruthe Mannan and Spiriva areno longer covered by insurance per her pharmacy. Advised Patient to contact insurance for formulary and call office back with preferred inhaler list. Understanding stated. Nothing further at this time.

## 2020-11-19 NOTE — Telephone Encounter (Signed)
TP  she does need to have a replacement for the spiriva.    Insurance will cover (to replace Spriva):   Anoro Breo Incruse (too strong, powder caused cough)-does not want this.

## 2020-11-19 NOTE — Telephone Encounter (Signed)
Received denial letter from [facility] for [facility] 100-5 as it is not covered under the formulary.  Patient has tried/failed:  Incruse ellipta Spiriva 2.5 Breztri Spiriva handihaler  Patient called back and gave information about what her insurance will cover to replace Spiriva and Dulera.  Insurance will cover (to replace Spriva):  Anoro Breo Incruse (too strong, powder caused cough)-does not want this.  To replace Monica Scott) Insureance will cover:  Advair Symbicort  Has to use [facility] ([address])  [name],  Please advise on what you would like sent in to the pharmacy for the patient?  Thank you.

## 2020-11-20 ENCOUNTER — Ambulatory Visit: Payer: Self-pay | Admitting: *Deleted

## 2020-11-20 NOTE — Telephone Encounter (Signed)
B/P today 166/89 and 155/80 with a HA that comes and goes. She was seen Monday and B/P then was 144/82 with a home b/p cuff. Denies SOB/CP/Dizziness/one-sided weakness.Taking antihypertensives as prescribed. Drinking only one cup of water daily and eating a lot of frozen foods and drinks cokes daily. Discussed high sodium meals, reducing Coke intake and increasing water intake to minimum of 4-5 glasses daily. Journal B/P and any symptoms to bring in to appointment. Reviewed urgent symptoms requiring an ED visit. Stated she understood conversation.

## 2020-11-20 NOTE — Telephone Encounter (Signed)
Reason for Disposition  [id number] Systolic BP  >= [id number] OR Diastolic >= 80 AND [id number] taking BP medications  Answer Assessment - Initial Assessment Questions 1. BLOOD PRESSURE: "What is the blood pressure?" "Did you take at least two measurements 5 minutes apart?"     164/89 2. ONSET: "When did you take your blood pressure?"     minutes 3. HOW: "How did you obtain the blood pressure?" (e.g., visiting nurse, automatic home BP monitor)    Home meter 4. HISTORY: "Do you have a history of high blood pressure?"     yes 5. MEDICATIONS: "Are you taking any medications for blood pressure?" "Have you missed any doses recently?"     Yes, 7:30a 6. OTHER SYMPTOMS: "Do you have any symptoms?" (e.g., headache, chest pain, blurred vision, difficulty breathing, weakness)     Ha only 7. PREGNANCY: "Is there any chance you are pregnant?" "When was your last menstrual period?"     na  Protocols used: Blood Pressure - High-A-AH

## 2020-11-20 NOTE — Telephone Encounter (Signed)
Spoke with the pt and notified of response per Monica Scott  She prefers to try the incruse again bc she already has this and wants to hold off on nebs  She states will call if has any more issues with incruse

## 2020-11-20 NOTE — Telephone Encounter (Signed)
The only one to replace spiriva is Incruse ,the other are not the same ,  if she can not take then will have to decide on nebs

## 2020-11-20 NOTE — Telephone Encounter (Signed)
Appointment made for 12/24/20 for blood pressure check. If the patient's Bp lowers with the interventions we discussed she will cancel 9/28. Welcome to Medicare appointment made for 01/07/21 with pcp.

## 2020-11-21 MED ORDER — IPRATROPIUM-ALBUTEROL 0.5-2.5 (3) MG/3ML IN SOLN: RESPIRATORY_TRACT | 2 refills | Status: DC

## 2020-11-21 NOTE — Telephone Encounter (Signed)
Checked cover my meds and duoneb denied. No dx code was given and it was filed under part D instead of B. I sent new rx to pharm with dx and intruction to file part B.

## 2020-12-06 ENCOUNTER — Other Ambulatory Visit: Payer: Self-pay | Admitting: Pulmonary Disease

## 2020-12-06 DIAGNOSIS — J449 Chronic obstructive pulmonary disease, unspecified: Secondary | ICD-10-CM

## 2020-12-08 ENCOUNTER — Telehealth: Payer: Self-pay | Admitting: Pulmonary Disease

## 2020-12-08 NOTE — Telephone Encounter (Signed)
Called and spoke with patient. She stated that she received a call from [facility] stating they needed a statement from our office stating that she was benefiting from [place]. I confirmed that she meant [facility] and not [employer]. Incruse is not her medication list but I did find a note from 08/25 about TP ok'ing her to return to Incruse. She stated that she has been doing well with the Incruse.   She did not have a number to call. Will need to call her insurance in the morning since it is after 5pm.

## 2020-12-09 ENCOUNTER — Other Ambulatory Visit: Payer: Self-pay

## 2020-12-10 ENCOUNTER — Other Ambulatory Visit: Payer: Self-pay

## 2020-12-17 ENCOUNTER — Other Ambulatory Visit: Payer: Self-pay

## 2020-12-22 ENCOUNTER — Other Ambulatory Visit: Payer: Self-pay

## 2020-12-23 ENCOUNTER — Other Ambulatory Visit: Payer: Self-pay

## 2020-12-24 ENCOUNTER — Ambulatory Visit: Payer: Medicare Other | Admitting: Physician Assistant

## 2021-01-01 NOTE — Telephone Encounter (Signed)
Patient is returning phone call. Patient phone number is [phone].

## 2021-01-01 NOTE — Telephone Encounter (Signed)
LMTCB   Call made to [facility] placed on for 17 minutes will try back at next business day.

## 2021-01-07 ENCOUNTER — Ambulatory Visit: Payer: Medicare (Managed Care) | Attending: Nurse Practitioner | Admitting: Nurse Practitioner

## 2021-01-07 ENCOUNTER — Other Ambulatory Visit: Payer: Self-pay

## 2021-01-07 ENCOUNTER — Encounter: Payer: Self-pay | Admitting: Nurse Practitioner

## 2021-01-07 VITALS — BP 123/77 | HR 92 | Ht 59.0 in | Wt 166.1 lb

## 2021-01-07 DIAGNOSIS — E876 Hypokalemia: Secondary | ICD-10-CM | POA: Diagnosis not present

## 2021-01-07 DIAGNOSIS — Z780 Asymptomatic menopausal state: Secondary | ICD-10-CM

## 2021-01-07 DIAGNOSIS — E871 Hypo-osmolality and hyponatremia: Secondary | ICD-10-CM | POA: Diagnosis not present

## 2021-01-07 DIAGNOSIS — K219 Gastro-esophageal reflux disease without esophagitis: Secondary | ICD-10-CM

## 2021-01-07 DIAGNOSIS — E669 Obesity, unspecified: Secondary | ICD-10-CM

## 2021-01-07 DIAGNOSIS — Z9189 Other specified personal risk factors, not elsewhere classified: Secondary | ICD-10-CM

## 2021-01-07 DIAGNOSIS — R1013 Epigastric pain: Secondary | ICD-10-CM

## 2021-01-07 DIAGNOSIS — Z23 Encounter for immunization: Secondary | ICD-10-CM

## 2021-01-07 DIAGNOSIS — I10 Essential (primary) hypertension: Secondary | ICD-10-CM

## 2021-01-07 DIAGNOSIS — Z0000 Encounter for general adult medical examination without abnormal findings: Secondary | ICD-10-CM

## 2021-01-07 DIAGNOSIS — Z6833 Body mass index (BMI) 33.0-33.9, adult: Secondary | ICD-10-CM

## 2021-01-07 DIAGNOSIS — Z0001 Encounter for general adult medical examination with abnormal findings: Secondary | ICD-10-CM

## 2021-01-07 MED ORDER — OZEMPIC (0.25 OR 0.5 MG/DOSE) 2 MG/1.5ML ~~LOC~~ SOPN: 0.5000 mg | PEN_INJECTOR | SUBCUTANEOUS | 1 refills | Status: AC

## 2021-01-07 MED ORDER — OMEPRAZOLE 40 MG PO CPDR: 40.0000 mg | DELAYED_RELEASE_CAPSULE | Freq: Two times a day (BID) | ORAL | 1 refills | Status: DC

## 2021-01-07 NOTE — Progress Notes (Signed)
Assessment & Plan:  Monica Scott was seen today for medicare wellness.  Diagnoses and all orders for this visit:  Welcome to Medicare preventive visit  Hypokalemia -     Cancel: Basic metabolic panel -     [id number]  Hyponatremia -     Cancel: Basic metabolic panel -     [id number]  Primary hypertension -     Semaglutide,0.25 or 0.5MG/DOS, (OZEMPIC, 0.25 OR 0.5 MG/DOSE,) 2 MG/1.5ML SOPN; Inject 0.5 mg into the skin once a week. Continue all antihypertensives as prescribed.  Remember to bring in your blood pressure log with you for your follow up appointment.  DASH/Mediterranean Diets are healthier choices for HTN.    Obesity (BMI 30-39.9) -     Semaglutide,0.25 or 0.5MG/DOS, (OZEMPIC, 0.25 OR 0.5 MG/DOSE,) 2 MG/1.5ML SOPN; Inject 0.5 mg into the skin once a week.  Epigastric pain -     omeprazole (PRILOSEC) 40 MG capsule; Take 1 capsule (40 mg total) by mouth in the morning and at bedtime. INSTRUCTIONS: Avoid GERD Triggers: acidic, spicy or fried foods, caffeine, coffee, sodas,  alcohol and chocolate.    Gastroesophageal reflux disease -     omeprazole (PRILOSEC) 40 MG capsule; Take 1 capsule (40 mg total) by mouth in the morning and at bedtime. INSTRUCTIONS: Avoid GERD Triggers: acidic, spicy or fried foods, caffeine, coffee, sodas,  alcohol and chocolate.     At risk for bone density loss -     DG Bone Density; Future  Postmenopausal estrogen deficiency -     DG Bone Density; Future   Patient has been counseled on age-appropriate routine health concerns for screening and prevention. These are reviewed and up-to-date. Referrals have been placed accordingly. Immunizations are up-to-date or declined.    Subjective:   Chief Complaint  Patient presents with   Medicare Wellness   HPI Monica Scott 65 y.o. female presents to office today for annual wellness exam.   Patient has been counseled on age-appropriate routine health concerns for screening and prevention.  These are reviewed and up-to-date. Referrals have been placed accordingly. Immunizations are up-to-date or declined.     MAMMOGRAM: UTD COLONOSCOPY: UTD DEXA SCAN: Ordered today  ROS  Past Medical History:  Diagnosis Date   Arthritis    Asthma    Bladder disease    COPD (chronic obstructive pulmonary disease) (HCC)    Depression    GERD (gastroesophageal reflux disease)    Hyperlipidemia    Hypertension    Peptic ulcer     Past Surgical History:  Procedure Laterality Date   ABDOMINAL HYSTERECTOMY     BIOPSY  04/24/2018   Procedure: BIOPSY;  Surgeon: Daneil Dolin, MD;  [facility];  Service: Endoscopy;;  esophagus   COLONOSCOPY WITH PROPOFOL N/A 04/24/2018   Procedure: COLONOSCOPY WITH PROPOFOL;  Surgeon: Daneil Dolin, MD;  [facility];  Service: Endoscopy;  Laterality: N/A;  9:00am   ESOPHAGOGASTRODUODENOSCOPY (EGD) WITH PROPOFOL N/A 04/24/2018   Procedure: ESOPHAGOGASTRODUODENOSCOPY (EGD) WITH PROPOFOL;  Surgeon: Daneil Dolin, MD;  [facility];  Service: Endoscopy;  Laterality: N/A;   MALONEY DILATION N/A 04/24/2018   Procedure: Venia Minks DILATION;  Surgeon: Daneil Dolin, MD;  [facility];  Service: Endoscopy;  Laterality: N/A;   PILONIDAL CYST EXCISION     age 54   POLYPECTOMY  04/24/2018   Procedure: POLYPECTOMY;  Surgeon: Daneil Dolin, MD;  [facility];  Service: Endoscopy;;  colon  Family History  Problem Relation Age of Onset   Alcohol abuse Mother    Arthritis Mother    Cancer Mother    Depression Mother    Early death Mother    Alcohol abuse Father    Depression Father    Alcohol abuse Sister    Depression Sister    Alcohol abuse Brother    Alcohol abuse Daughter    Depression Daughter    Alcohol abuse Son    COPD Maternal Grandfather    Heart disease Maternal Grandfather    Hypertension Maternal Grandfather    Colon cancer Brother        deceased at age 68    Social History  Reviewed with no changes to be made today.   Outpatient Medications Prior to Visit  Medication Sig Dispense Refill   albuterol (PROVENTIL) (2.5 MG/3ML) 0.083% nebulizer solution Take 3 mLs (2.5 mg total) by nebulization every 6 (six) hours as needed for wheezing or shortness of breath. 360 mL 5   budesonide-formoterol (SYMBICORT) 80-4.5 MCG/ACT inhaler Inhale 2 puffs into the lungs in the morning and at bedtime. 1 each 12   buPROPion (WELLBUTRIN XL) 150 MG 24 hr tablet Take 150 mg by mouth at bedtime.     cefdinir (OMNICEF) 300 MG capsule Take 1 capsule (300 mg total) by mouth 2 (two) times daily. 14 capsule 0   FLUoxetine (PROZAC) 20 MG capsule Take 60 mg by mouth daily.     gabapentin (NEURONTIN) 400 MG capsule Take 1 capsule (400 mg total) by mouth 3 (three) times daily. 90 capsule 11   ibuprofen (ADVIL,MOTRIN) 200 MG tablet Take 400 mg by mouth 3 (three) times daily as needed for moderate pain.     ipratropium-albuterol (DUONEB) 0.5-2.5 (3) MG/3ML SOLN USE 1 AMPULE IN NEBULIZER EVERY 6 HOURS AS NEEDED 360 mL 2   loperamide (IMODIUM A-D) 2 MG tablet Take 4 mg by mouth 2 (two) times daily as needed for diarrhea or loose stools.     methocarbamol (ROBAXIN) 750 MG tablet Take 1 tablet (750 mg total) by mouth every 6 (six) hours as needed for muscle spasms. 60 tablet 3   OLANZapine (ZYPREXA) 5 MG tablet Take 10 mg by mouth at bedtime.      ondansetron (ZOFRAN ODT) 4 MG disintegrating tablet 36m ODT q4 hours prn nausea/vomit 10 tablet 0   PROAIR HFA 108 (90 Base) MCG/ACT inhaler INHALE 1-2 PUFFS EVERY 6 HOURS AS NEEDED FOR WHEEZE OR SHORTNESS OF BREATH 8.5 each 1   traZODone (DESYREL) 100 MG tablet Take 100 mg by mouth at bedtime as needed.     omeprazole (PRILOSEC) 40 MG capsule TAKE 1 CAPSULE (40 MG TOTAL) BY MOUTH 2 (TWO) TIMES DAILY. TO REDUCE STOMACH ACID 60 capsule 2   amLODipine (NORVASC) 5 MG tablet Take 1 tablet (5 mg total) by mouth 2 (two) times daily. 60 tablet 6   atorvastatin  (LIPITOR) 20 MG tablet Take 1 tablet (20 mg total) by mouth daily. 30 tablet 11   olmesartan (BENICAR) 40 MG tablet Take 1 tablet (40 mg total) by mouth daily. 30 tablet 3   Tiotropium Bromide Monohydrate (SPIRIVA RESPIMAT) 2.5 MCG/ACT AERS Inhale 2 puffs into the lungs daily. (Patient not taking: Reported on 01/07/2021) 1 g 5   baclofen (LIORESAL) 10 MG tablet Take 0.5-1 tablets (5-10 mg total) by mouth 3 (three) times daily as needed for muscle spasms. (Patient not taking: Reported on 01/07/2021) 30 each 3   cyclobenzaprine (FLEXERIL) 5  MG tablet Take 1 tablet (5 mg total) by mouth 3 (three) times daily as needed for muscle spasms. (Patient not taking: Reported on 01/07/2021) 90 tablet 1   No facility-administered medications prior to visit.    Allergies  Allergen Reactions   Tizanidine Other (See Comments)    Caused dizziness and dry mouth. Patient prefers not to take this again.       Objective:    BP 123/77   Pulse 92   Ht _0  (1.499 m)   Wt 166 lb 2 oz (75.4 kg)   SpO2 91%   BMI 33.55 kg/m  Wt Readings from Last 3 Encounters:  01/07/21 166 lb 2 oz (75.4 kg)  11/09/20 165 lb (74.8 kg)  08/15/20 161 lb 6.4 oz (73.2 kg)    Physical Exam       Patient has been counseled extensively about nutrition and exercise as well as the importance of adherence with medications and regular follow-up. The patient was given clear instructions to go to ER or return to medical center if symptoms don't improve, worsen or new problems develop. The patient verbalized understanding.   Follow-up: Return in about 6 weeks (around 02/18/2021) for weight check.   Gildardo Pounds, FNP-BC [facility] [place], [place]   01/07/2021, 9:42 PM

## 2021-01-08 LAB — CMP14+EGFR
ALT: 22 IU/L (ref 0–32)
AST: 23 IU/L (ref 0–40)
Albumin/Globulin Ratio: 2.6 — ABNORMAL HIGH (ref 1.2–2.2)
Albumin: 4.6 g/dL (ref 3.8–4.8)
Alkaline Phosphatase: 159 IU/L — ABNORMAL HIGH (ref 44–121)
BUN/Creatinine Ratio: 4 — ABNORMAL LOW (ref 12–28)
BUN: 3 mg/dL — ABNORMAL LOW (ref 8–27)
Bilirubin Total: 0.4 mg/dL (ref 0.0–1.2)
CO2: 25 mmol/L (ref 20–29)
Calcium: 9.3 mg/dL (ref 8.7–10.3)
Chloride: 90 mmol/L — ABNORMAL LOW (ref 96–106)
Creatinine, Ser: 0.72 mg/dL (ref 0.57–1.00)
Globulin, Total: 1.8 g/dL (ref 1.5–4.5)
Glucose: 86 mg/dL (ref 70–99)
Potassium: 3.3 mmol/L — ABNORMAL LOW (ref 3.5–5.2)
Sodium: 138 mmol/L (ref 134–144)
Total Protein: 6.4 g/dL (ref 6.0–8.5)
eGFR: 93 mL/min/{1.73_m2} (ref >59)

## 2021-01-09 ENCOUNTER — Telehealth: Payer: Self-pay

## 2021-01-09 NOTE — Telephone Encounter (Signed)
-----   Message from Gildardo Pounds, NP sent at 01/09/2021  8:14 AM EDT ----- Sodium decreased and liver enzymes elevated. Need to return to repeat labs in 2 weeks fasting.  Nothing to eat 6 hours prior to lab visit.  Avoid foods that are high in fat and cholesterol or any excessive alcohol intake.

## 2021-01-13 NOTE — Addendum Note (Signed)
Addended by: Barkley Bruns on: 01/13/2021 02:08 PM   Modules accepted: Orders

## 2021-01-23 ENCOUNTER — Other Ambulatory Visit: Payer: Medicare (Managed Care)

## 2021-01-26 NOTE — Progress Notes (Signed)
[facility] New Patient Evaluation and Consultation  Referring Provider: Gildardo Pounds, NP PCP: Gildardo Pounds, NP Date of Service: 01/27/2021  SUBJECTIVE Chief Complaint: New Patient (Initial Visit) Monica Scott is a 65 y.o. female here for a consult on prolapse./)  History of Present Illness: Monica Scott is a 65 y.o. White or Caucasian female seen in consultation at the request of NP Raul Del for evaluation of prolapse and incomplete bladder emptying.    Review of records significant for: Patient states bladder stopped working after her hysterectomy 20 years ago. She self- caths 4-5 times per day. Has a bulge at the introitus.   Urinary Symptoms: Does not leak urine.   Day time voids 6  Nocturia: 1 times per night to void. Voiding dysfunction: she empties her bladder well.  does use a catheter to empty bladder- self catheterizes for the last 20 years, about 6 times per day.  Had urodynamic testing about 20 years ago. Was told there was nothing that could be done.  When urinating, she feels she has no difficulties  UTIs: 1 UTI's in the last year.   Reports history of pyelonephritis  Pelvic Organ Prolapse Symptoms:                  She Admits to a feeling of a bulge the vaginal area. It has been present for 2 years.  She Admits to seeing a bulge.  This bulge is bothersome.  Bowel Symptom: Bowel movements: 2 time(s) per week Stool consistency: hard Straining: no.  Splinting: no.  Incomplete evacuation: no.  She Admits to accidental bowel leakage / fecal incontinence  Occurs: 2 time(s) per year  Consistency with leakage: liquid Bowel regimen: miralax Last colonoscopy: Date 2020, Results negative  Sexual Function Sexually active: no.    Pelvic Pain Denies pelvic pain   Past Medical History:  Past Medical History:  Diagnosis Date   Arthritis    Asthma    Bladder disease    COPD (chronic obstructive pulmonary disease) (HCC)     Depression    GERD (gastroesophageal reflux disease)    Hyperlipidemia    Hypertension    Peptic ulcer      Past Surgical History:   Past Surgical History:  Procedure Laterality Date   BIOPSY  04/24/2018   Procedure: BIOPSY;  Surgeon: Daneil Dolin, MD;  [facility];  Service: Endoscopy;;  esophagus   COLONOSCOPY WITH PROPOFOL N/A 04/24/2018   Procedure: COLONOSCOPY WITH PROPOFOL;  Surgeon: Daneil Dolin, MD;  [facility];  Service: Endoscopy;  Laterality: N/A;  9:00am   ESOPHAGOGASTRODUODENOSCOPY (EGD) WITH PROPOFOL N/A 04/24/2018   Procedure: ESOPHAGOGASTRODUODENOSCOPY (EGD) WITH PROPOFOL;  Surgeon: Daneil Dolin, MD;  [facility];  Service: Endoscopy;  Laterality: N/A;   MALONEY DILATION N/A 04/24/2018   Procedure: Venia Minks DILATION;  Surgeon: Daneil Dolin, MD;  [facility];  Service: Endoscopy;  Laterality: N/A;   PILONIDAL CYST EXCISION     age 80   POLYPECTOMY  04/24/2018   Procedure: POLYPECTOMY;  Surgeon: Daneil Dolin, MD;  [facility];  Service: Endoscopy;;  colon    VAGINAL HYSTERECTOMY       Past OB/GYN History: OB History  Gravida Para Term Preterm AB Living  5       1    SAB IAB Ectopic Multiple Live Births          4    #  Outcome Date GA Lbr Len/2nd Weight Sex Delivery Anes PTL Lv  5 Gravida           4 Gravida           3 Gravida           2 Gravida           1 AB            S/p hysterectomy   Medications: She has a current medication list which includes the following prescription(s): albuterol, budesonide-formoterol, bupropion, fluoxetine, gabapentin, ibuprofen, ipratropium-albuterol, loperamide, methocarbamol, olanzapine, omeprazole, ondansetron, proair hfa, ozempic (0.25 or 0.5 mg/dose), spiriva respimat, trazodone, amlodipine, atorvastatin, and olmesartan.   Allergies: Patient is allergic to tizanidine.   Social History:  Social History   Tobacco Use   Smoking status: Former     Packs/day: 1.50    Years: 40.00    Pack years: 60.00    Types: Cigarettes    Quit date: 04/19/2010    Years since quitting: 10.7   Smokeless tobacco: Never  Vaping Use   Vaping Use: Never used  Substance Use Topics   Alcohol use: No    Comment: Former ETOH. none since 2000   Drug use: No      Family History:   Family History  Problem Relation Age of Onset   Alcohol abuse Mother    Arthritis Mother    Cancer Mother    Depression Mother    Early death Mother    Alcohol abuse Father    Depression Father    Alcohol abuse Sister    Depression Sister    Alcohol abuse Brother    Alcohol abuse Daughter    Depression Daughter    Alcohol abuse Son    COPD Maternal Grandfather    Heart disease Maternal Grandfather    Hypertension Maternal Grandfather    Colon cancer Brother        deceased at age 7     Review of Systems: Review of Systems  Constitutional:  Negative for fever, malaise/fatigue and weight loss.  Respiratory:  Positive for shortness of breath. Negative for cough and wheezing.   Cardiovascular:  Negative for chest pain, palpitations and leg swelling.  Gastrointestinal:  Negative for abdominal pain and blood in stool.  Genitourinary:  Negative for dysuria.  Musculoskeletal:  Positive for myalgias.  Skin:  Negative for rash.  Neurological:  Negative for dizziness and headaches.  Endo/Heme/Allergies:  Bruises/bleeds easily.  Psychiatric/Behavioral:  Positive for depression. The patient is not nervous/anxious and does not have insomnia.     OBJECTIVE Physical Exam: Vitals:   01/27/21 0954  BP: 121/81  Pulse: 86    Physical Exam Constitutional:      General: She is not in acute distress. Pulmonary:     Effort: Pulmonary effort is normal.  Abdominal:     General: There is no distension.     Palpations: Abdomen is soft.     Tenderness: There is no abdominal tenderness. There is no rebound.  Musculoskeletal:        General: No swelling. Normal  range of motion.  Skin:    General: Skin is warm and dry.     Findings: No rash.  Neurological:     Mental Status: She is alert and oriented to person, place, and time.  Psychiatric:        Mood and Affect: Mood normal.        Behavior: Behavior normal.     GU /  Detailed Urogynecologic Evaluation:  Pelvic Exam: Normal external female genitalia; Bartholin's and Skene's glands normal in appearance; urethral meatus normal in appearance, no urethral masses or discharge.   CST: negative  s/p hysterectomy: Speculum exam reveals normal vaginal mucosa with  atrophy and normal vaginal cuff.  Adnexa no mass, fullness, tenderness.     Pelvic floor strength III/V  Pelvic floor musculature: Right levator non-tender, Right obturator non-tender, Left levator non-tender, Left obturator non-tender  POP-Q:   POP-Q  2                                            Aa   2                                           Ba  -4                                              C   5                                            Gh  2                                            Pb  8                                            tvl   -2.5                                            Ap  -2.5                                            Bp                                                 D     Rectal Exam:  Normal external rectum  Post-Void Residual (PVR) by Bladder Scan: In order to evaluate bladder emptying, we discussed obtaining a postvoid residual and she agreed to this procedure.  Procedure: The ultrasound unit was placed on the patient's abdomen in the suprapubic region after the patient had voided. A PVR of 498 ml was obtained by bladder scan.  Laboratory Results: POC urine: trace leukocytes  ASSESSMENT AND PLAN Ms. Barbato is a 65 y.o. with:  1. Prolapse of anterior vaginal wall   2. Vaginal vault prolapse after  hysterectomy   3. Incomplete bladder emptying   4. Urinary frequency    Stage  III anterior, Stage I posterior, Stage I apical prolapse -For treatment of pelvic organ prolapse, we discussed options for management including expectant management, conservative management, and surgical management, such as Kegels, a pessary, pelvic floor physical therapy, and specific surgical procedures. - She is interested in surgery. We discussed two options for prolapse repair:  1) vaginal repair without mesh - Pros - safer, no mesh complications - Cons - not as strong as mesh repair, higher risk of recurrence  2) laparoscopic repair with mesh - Pros - stronger, better long-term success - Cons - risks of mesh implant (erosion into vagina or bladder, adhering to the rectum, pain) - these risks are lower than with a vaginal mesh but still exist - Prefers vaginal procedure. Will plan for: Sacrospinous ligament fixation, anterior repair, perineorrhaphy, cystoscopy  2. Incomplete emptying - self-catheterizes 6x daily - Will do urodynamics for further workup. If non-obstructive, can consider sacral neuromodulation for treatment.    Jaquita Folds, MD   Medical Decision Making:  - Reviewed/ ordered a clinical laboratory test - Reviewed/ ordered medicine test - Review and summation of prior records

## 2021-01-27 ENCOUNTER — Other Ambulatory Visit: Payer: Self-pay

## 2021-01-27 ENCOUNTER — Ambulatory Visit (INDEPENDENT_AMBULATORY_CARE_PROVIDER_SITE_OTHER): Payer: Medicare (Managed Care) | Admitting: Obstetrics and Gynecology

## 2021-01-27 ENCOUNTER — Encounter: Payer: Self-pay | Admitting: Obstetrics and Gynecology

## 2021-01-27 VITALS — BP 121/81 | HR 86

## 2021-01-27 DIAGNOSIS — N8110 Cystocele, unspecified: Secondary | ICD-10-CM | POA: Diagnosis not present

## 2021-01-27 DIAGNOSIS — R350 Frequency of micturition: Secondary | ICD-10-CM | POA: Diagnosis not present

## 2021-01-27 DIAGNOSIS — N993 Prolapse of vaginal vault after hysterectomy: Secondary | ICD-10-CM | POA: Diagnosis not present

## 2021-01-27 DIAGNOSIS — R339 Retention of urine, unspecified: Secondary | ICD-10-CM

## 2021-01-27 DIAGNOSIS — R3914 Feeling of incomplete bladder emptying: Secondary | ICD-10-CM

## 2021-01-27 LAB — POCT URINALYSIS DIPSTICK
Appearance: NORMAL
Bilirubin, UA: NEGATIVE
Blood, UA: NEGATIVE
Glucose, UA: NEGATIVE
Ketones, UA: NEGATIVE
Nitrite, UA: NEGATIVE
Protein, UA: NEGATIVE
Spec Grav, UA: <=1.005 — AB (ref 1.010–1.025)
Urobilinogen, UA: 0.2 E.U./dL
pH, UA: 6.5 (ref 5.0–8.0)

## 2021-01-27 NOTE — Patient Instructions (Signed)

## 2021-01-28 ENCOUNTER — Ambulatory Visit: Payer: Medicare (Managed Care) | Attending: Nurse Practitioner

## 2021-01-28 ENCOUNTER — Other Ambulatory Visit: Payer: Self-pay | Admitting: Nurse Practitioner

## 2021-01-28 DIAGNOSIS — E876 Hypokalemia: Secondary | ICD-10-CM

## 2021-01-28 DIAGNOSIS — I10 Essential (primary) hypertension: Secondary | ICD-10-CM

## 2021-01-29 LAB — CMP14+EGFR
ALT: 13 IU/L (ref 0–32)
AST: 13 IU/L (ref 0–40)
Albumin/Globulin Ratio: 1.7 (ref 1.2–2.2)
Albumin: 4.1 g/dL (ref 3.8–4.8)
Alkaline Phosphatase: 145 IU/L — ABNORMAL HIGH (ref 44–121)
BUN/Creatinine Ratio: 17 (ref 12–28)
BUN: 15 mg/dL (ref 8–27)
Bilirubin Total: 0.3 mg/dL (ref 0.0–1.2)
CO2: 28 mmol/L (ref 20–29)
Calcium: 9.4 mg/dL (ref 8.7–10.3)
Chloride: 97 mmol/L (ref 96–106)
Creatinine, Ser: 0.88 mg/dL (ref 0.57–1.00)
Globulin, Total: 2.4 g/dL (ref 1.5–4.5)
Glucose: 110 mg/dL — ABNORMAL HIGH (ref 70–99)
Potassium: 4.1 mmol/L (ref 3.5–5.2)
Sodium: 142 mmol/L (ref 134–144)
Total Protein: 6.5 g/dL (ref 6.0–8.5)
eGFR: 73 mL/min/{1.73_m2} (ref >59)

## 2021-02-02 ENCOUNTER — Telehealth: Payer: Self-pay

## 2021-02-02 NOTE — Telephone Encounter (Signed)
-----   Message from Gildardo Pounds, NP sent at 01/30/2021  8:04 AM EDT ----- Liver enzymes have improved.  Maintain a diet that is low in fat and cholesterol and limit any alcohol consumption if any.  Kidney function and electrolytes are normal

## 2021-02-03 ENCOUNTER — Telehealth: Payer: Self-pay

## 2021-02-03 NOTE — Telephone Encounter (Signed)
Pt returned call. Provided pt with labs pt doesn't have any questions or concerns

## 2021-02-05 ENCOUNTER — Telehealth (INDEPENDENT_AMBULATORY_CARE_PROVIDER_SITE_OTHER): Payer: Self-pay

## 2021-02-05 ENCOUNTER — Ambulatory Visit: Payer: Medicare (Managed Care) | Admitting: Surgery

## 2021-02-05 NOTE — Telephone Encounter (Signed)
Copied from [place] [id number]. Topic: General - Inquiry >> Feb 02, 2021  4:06 PM Loma [place] wrote: Reason for CRM: Called office Pt was called re labs and dr/Nurse in with pt   pls FU [phone]

## 2021-02-05 NOTE — Telephone Encounter (Signed)
Pt 11/8 and nurse spoke to pt about labs.

## 2021-02-17 ENCOUNTER — Ambulatory Visit (INDEPENDENT_AMBULATORY_CARE_PROVIDER_SITE_OTHER): Payer: Medicare (Managed Care) | Admitting: Obstetrics and Gynecology

## 2021-02-17 ENCOUNTER — Other Ambulatory Visit: Payer: Self-pay

## 2021-02-17 DIAGNOSIS — R350 Frequency of micturition: Secondary | ICD-10-CM | POA: Diagnosis not present

## 2021-02-17 LAB — POCT URINALYSIS DIPSTICK
Appearance: NORMAL
Bilirubin, UA: NEGATIVE
Blood, UA: NEGATIVE
Glucose, UA: NEGATIVE
Ketones, UA: NEGATIVE
Nitrite, UA: NEGATIVE
Protein, UA: NEGATIVE
Spec Grav, UA: 1.01 (ref 1.010–1.025)
Urobilinogen, UA: 0.2 E.U./dL
pH, UA: 6 (ref 5.0–8.0)

## 2021-02-17 NOTE — Patient Instructions (Signed)

## 2021-02-17 NOTE — Progress Notes (Signed)
[facility] Urodynamics Procedure  Referring Physician: Gildardo Pounds, NP Date of Procedure: 02/17/2021  Monica Scott is a 65 y.o. female who presents for urodynamic evaluation. Indication(s) for study:  incomplete bladder emptying  Vital Signs: There were no vitals taken for this visit.  Laboratory Results: A catheterized urine specimen revealed:  Trace leukocytes, negative nitrites   Voiding Diary: Not performed  Procedure Timeout:  The correct patient was verified and the correct procedure was verified. The patient was in the correct position and safety precautions were reviewed based on at the patient's history.  Urodynamic Procedure A 69F dual lumen urodynamics catheter was placed under sterile conditions into the patient's bladder. A 69F catheter was placed into the rectum in order to measure abdominal pressure. EMG patches were placed in the appropriate position.  All connections were confirmed and calibrations/adjusted made. Saline was instilled into the bladder through the dual lumen catheters.  Cough/valsalva pressures were measured periodically during filling.  Patient was allowed to void.  The bladder was then emptied of its residual.  UROFLOW: Unable to void. PVR was 418ml.   CMG: This was performed with sterile water in the sitting position at a fill rate of 30 mL/min.    First sensation of fullness was 452 mLs,  First urge was 527 mLs,  Strong urge was 588 mLs and  Capacity was 750 mLs  Stress incontinence was not demonstrated Highest negative Barrier CLPP was 61 cmH20 at 700 ml. Highest negative Barrier VLPP was 69 cmH20 at 700 ml.  Detrusor function was normal, with no phasic contractions seen.   Compliance:  normal.   UPP: MUCP with barrier reduction was 95 cm of water.    MICTURITION STUDY: Prolapse was reduced with scopettes. Patient was unable to void.   EMG: This was performed with patches.  She had voluntary contractions,  recruitment with fill was present and urethral sphincter was relaxed with attempt to void.  The details of the procedure with the study tracings have been scanned into EPIC.   Urodynamic Impression:  1. Sensation was reduced; capacity was increased 2. Stress Incontinence was not demonstrated. 3. Detrusor Overactivity was not demonstrated. 4. Emptying was dysfunctional with a elevated PVR, a sustained detrusor contraction not present,  abdominal straining not present, normal urethral sphincter activity on EMG. Indicative of non-obstructive urinary retention.   Plan: - The patient will follow up  to discuss the findings and treatment options.   Jaquita Folds, MD

## 2021-02-18 ENCOUNTER — Encounter: Payer: Self-pay | Admitting: Nurse Practitioner

## 2021-02-18 ENCOUNTER — Ambulatory Visit: Payer: Medicare (Managed Care) | Attending: Nurse Practitioner | Admitting: Nurse Practitioner

## 2021-02-18 VITALS — BP 125/80 | HR 119 | Ht 59.0 in | Wt 160.4 lb

## 2021-02-18 DIAGNOSIS — I10 Essential (primary) hypertension: Secondary | ICD-10-CM

## 2021-02-18 DIAGNOSIS — E669 Obesity, unspecified: Secondary | ICD-10-CM

## 2021-02-18 DIAGNOSIS — R7303 Prediabetes: Secondary | ICD-10-CM

## 2021-02-18 MED ORDER — SEMAGLUTIDE (1 MG/DOSE) 4 MG/3ML ~~LOC~~ SOPN: 1.0000 mg | PEN_INJECTOR | SUBCUTANEOUS | 1 refills | Status: DC

## 2021-02-18 NOTE — Progress Notes (Signed)
Assessment & Plan:  Monica Scott was seen today for hypertension.  Diagnoses and all orders for this visit:  Hypertension, essential, benign  Obesity (BMI 30-39.9) -     Semaglutide, 1 MG/DOSE, 4 MG/3ML SOPN; Inject 1 mg as directed once a week.  Prediabetes -     Semaglutide, 1 MG/DOSE, 4 MG/3ML SOPN; Inject 1 mg as directed once a week.   Patient has been counseled on age-appropriate routine health concerns for screening and prevention. These are reviewed and up-to-date. Referrals have been placed accordingly. Immunizations are up-to-date or declined.    Subjective:   Chief Complaint  Patient presents with   Hypertension    Hypertension Pertinent negatives include no blurred vision, chest pain, headaches, malaise/fatigue, palpitations or shortness of breath.  Monica Scott 65 y.o. female presents to office today for weight check and HTN. She has a past medical history of Arthritis, Asthma, Bladder disease, COPD, Depression, GERD, Hyperlipidemia, Hypertension, Peptic ulcer, and Urinary retention.   HTN Blood pressure well controlled with Benicar 40 mg daily and amlodipine 5 mg twice daily. BP Readings from Last 3 Encounters:  02/18/21 125/80  01/27/21 121/81  01/07/21 123/77    Obesity  BMI 32. She is currently taking ozempic for weight loss. She is also prediabetic.  Weight is down 6 pounds over the past 6 weeks.  Will increase Ozempic to 1 mg weekly at this time.  She denies any adverse side effects with taking Ozempic. Wt Readings from Last 3 Encounters:  02/18/21 160 lb 6 oz (72.7 kg)  01/07/21 166 lb 2 oz (75.4 kg)  11/09/20 165 lb (74.8 kg)    Lab Results  Component Value Date   HGBA1C 5.5 06/04/2020      Review of Systems  Constitutional:  Negative for fever, malaise/fatigue and weight loss.  HENT: Negative.  Negative for nosebleeds.   Eyes: Negative.  Negative for blurred vision, double vision and photophobia.  Respiratory: Negative.  Negative for cough  and shortness of breath.   Cardiovascular: Negative.  Negative for chest pain, palpitations and leg swelling.  Gastrointestinal: Negative.  Negative for heartburn, nausea and vomiting.  Musculoskeletal: Negative.  Negative for myalgias.  Neurological: Negative.  Negative for dizziness, focal weakness, seizures and headaches.  Psychiatric/Behavioral: Negative.  Negative for suicidal ideas.    Past Medical History:  Diagnosis Date   Arthritis    Asthma    Bladder disease    COPD (chronic obstructive pulmonary disease) ([place])    Depression    GERD (gastroesophageal reflux disease)    Hyperlipidemia    Hypertension    Peptic ulcer    Urinary retention     Past Surgical History:  Procedure Laterality Date   BIOPSY  04/24/2018   Procedure: BIOPSY;  Surgeon: Daneil Dolin, MD;  [facility];  Service: Endoscopy;;  esophagus   COLONOSCOPY WITH PROPOFOL N/A 04/24/2018   Procedure: COLONOSCOPY WITH PROPOFOL;  Surgeon: Daneil Dolin, MD;  [facility];  Service: Endoscopy;  Laterality: N/A;  9:00am   ESOPHAGOGASTRODUODENOSCOPY (EGD) WITH PROPOFOL N/A 04/24/2018   Procedure: ESOPHAGOGASTRODUODENOSCOPY (EGD) WITH PROPOFOL;  Surgeon: Daneil Dolin, MD;  [facility];  Service: Endoscopy;  Laterality: N/A;   MALONEY DILATION N/A 04/24/2018   Procedure: Venia Minks DILATION;  Surgeon: Daneil Dolin, MD;  [facility];  Service: Endoscopy;  Laterality: N/A;   PILONIDAL CYST EXCISION     age 25   POLYPECTOMY  04/24/2018   Procedure:  POLYPECTOMY;  Surgeon: Daneil Dolin, MD;  [facility];  Service: Endoscopy;;  colon    VAGINAL HYSTERECTOMY      Family History  Problem Relation Age of Onset   Alcohol abuse Mother    Arthritis Mother    Cancer Mother    Depression Mother    Early death Mother    Alcohol abuse Father    Depression Father    Alcohol abuse Sister    Depression Sister    Alcohol abuse Brother    Alcohol abuse  Daughter    Depression Daughter    Alcohol abuse Son    COPD Maternal Grandfather    Heart disease Maternal Grandfather    Hypertension Maternal Grandfather    Colon cancer Brother        deceased at age 26    Social History Reviewed with no changes to be made today.   Outpatient Medications Prior to Visit  Medication Sig Dispense Refill   albuterol (PROVENTIL) (2.5 MG/3ML) 0.083% nebulizer solution Take 3 mLs (2.5 mg total) by nebulization every 6 (six) hours as needed for wheezing or shortness of breath. 360 mL 5   budesonide-formoterol (SYMBICORT) 80-4.5 MCG/ACT inhaler Inhale 2 puffs into the lungs in the morning and at bedtime. 1 each 12   buPROPion (WELLBUTRIN XL) 150 MG 24 hr tablet Take 150 mg by mouth at bedtime.     FLUoxetine (PROZAC) 20 MG capsule Take 60 mg by mouth daily.     gabapentin (NEURONTIN) 400 MG capsule Take 1 capsule (400 mg total) by mouth 3 (three) times daily. 90 capsule 11   ibuprofen (ADVIL,MOTRIN) 200 MG tablet Take 400 mg by mouth 3 (three) times daily as needed for moderate pain.     ipratropium-albuterol (DUONEB) 0.5-2.5 (3) MG/3ML SOLN USE 1 AMPULE IN NEBULIZER EVERY 6 HOURS AS NEEDED 360 mL 2   loperamide (IMODIUM A-D) 2 MG tablet Take 4 mg by mouth 2 (two) times daily as needed for diarrhea or loose stools.     methocarbamol (ROBAXIN) 750 MG tablet Take 1 tablet (750 mg total) by mouth every 6 (six) hours as needed for muscle spasms. 60 tablet 3   OLANZapine (ZYPREXA) 5 MG tablet Take 10 mg by mouth at bedtime.      omeprazole (PRILOSEC) 40 MG capsule Take 1 capsule (40 mg total) by mouth in the morning and at bedtime. 180 capsule 1   ondansetron (ZOFRAN ODT) 4 MG disintegrating tablet 4mg  ODT q4 hours prn nausea/vomit 10 tablet 0   PROAIR HFA 108 (90 Base) MCG/ACT inhaler INHALE 1-2 PUFFS EVERY 6 HOURS AS NEEDED FOR WHEEZE OR SHORTNESS OF BREATH 8.5 each 1   Tiotropium Bromide Monohydrate (SPIRIVA RESPIMAT) 2.5 MCG/ACT AERS Inhale 2 puffs into the  lungs daily. 1 g 5   traZODone (DESYREL) 100 MG tablet Take 100 mg by mouth at bedtime as needed.     amLODipine (NORVASC) 5 MG tablet Take 1 tablet (5 mg total) by mouth 2 (two) times daily. 60 tablet 6   atorvastatin (LIPITOR) 20 MG tablet Take 1 tablet (20 mg total) by mouth daily. 30 tablet 11   olmesartan (BENICAR) 40 MG tablet Take 1 tablet (40 mg total) by mouth daily. 30 tablet 3   No facility-administered medications prior to visit.    Allergies  Allergen Reactions   Tizanidine Other (See Comments)    Caused dizziness and dry mouth. Patient prefers not to take this again.  Objective:    BP 125/80   Pulse (!) 119   Ht 4\' 11"  (1.499 m)   Wt 160 lb 6 oz (72.7 kg)   SpO2 90%   BMI 32.39 kg/m  Wt Readings from Last 3 Encounters:  02/18/21 160 lb 6 oz (72.7 kg)  01/07/21 166 lb 2 oz (75.4 kg)  11/09/20 165 lb (74.8 kg)    Physical Exam Vitals and nursing note reviewed.  Constitutional:      Appearance: She is well-developed.  HENT:     Head: Normocephalic and atraumatic.  Cardiovascular:     Rate and Rhythm: Regular rhythm. Tachycardia present.     Heart sounds: Normal heart sounds. No murmur heard.   No friction rub. No gallop.  Pulmonary:     Effort: Pulmonary effort is normal. No tachypnea or respiratory distress.     Breath sounds: Normal breath sounds. No decreased breath sounds, wheezing, rhonchi or rales.  Chest:     Chest wall: No tenderness.  Abdominal:     General: Bowel sounds are normal.     Palpations: Abdomen is soft.  Musculoskeletal:        General: Normal range of motion.     Cervical back: Normal range of motion.  Skin:    General: Skin is warm and dry.  Neurological:     Mental Status: She is alert and oriented to person, place, and time.     Coordination: Coordination normal.  Psychiatric:        Behavior: Behavior normal. Behavior is cooperative.        Thought Content: Thought content normal.        Judgment: Judgment normal.          Patient has been counseled extensively about nutrition and exercise as well as the importance of adherence with medications and regular follow-up. The patient was given clear instructions to go to ER or return to medical center if symptoms don't improve, worsen or new problems develop. The patient verbalized understanding.   Follow-up: Return in about 3 months (around 05/21/2021) for weight check, BP.   Gildardo Pounds, FNP-BC [facility], [place]   02/18/2021, 2:40 PM

## 2021-02-27 ENCOUNTER — Encounter: Payer: Self-pay | Admitting: Obstetrics and Gynecology

## 2021-02-27 ENCOUNTER — Ambulatory Visit (INDEPENDENT_AMBULATORY_CARE_PROVIDER_SITE_OTHER): Payer: Medicare (Managed Care) | Admitting: Obstetrics and Gynecology

## 2021-02-27 ENCOUNTER — Other Ambulatory Visit: Payer: Self-pay

## 2021-02-27 VITALS — BP 122/80 | HR 76

## 2021-02-27 DIAGNOSIS — N993 Prolapse of vaginal vault after hysterectomy: Secondary | ICD-10-CM

## 2021-02-27 DIAGNOSIS — R339 Retention of urine, unspecified: Secondary | ICD-10-CM | POA: Diagnosis not present

## 2021-02-27 DIAGNOSIS — N8110 Cystocele, unspecified: Secondary | ICD-10-CM

## 2021-02-27 NOTE — Progress Notes (Signed)
[facility] Return Visit  SUBJECTIVE  History of Present Illness: FINLEE CONCEPCION is a 65 y.o. female seen in follow-up for after urodynamic testing to discuss surgery for her prolapse.   Urodynamic Impression:  1. Sensation was reduced; capacity was increased 2. Stress Incontinence was not demonstrated. 3. Detrusor Overactivity was not demonstrated. 4. Emptying was dysfunctional with a elevated PVR, a sustained detrusor contraction not present,  abdominal straining not present, normal urethral sphincter activity on EMG. Indicative of non-obstructive urinary retention.    Past Medical History: Patient  has a past medical history of Arthritis, Asthma, Bladder disease, COPD (chronic obstructive pulmonary disease) ([place]), Depression, GERD (gastroesophageal reflux disease), Hyperlipidemia, Hypertension, Peptic ulcer, and Urinary retention.   Past Surgical History: She  has a past surgical history that includes Vaginal hysterectomy; Pilonidal cyst excision; Colonoscopy with propofol (N/A, 04/24/2018); Esophagogastroduodenoscopy (egd) with propofol (N/A, 04/24/2018); maloney dilation (N/A, 04/24/2018); biopsy (04/24/2018); and polypectomy (04/24/2018).   Medications: She has a current medication list which includes the following prescription(s): albuterol, amlodipine, atorvastatin, budesonide-formoterol, bupropion, fluoxetine, gabapentin, ibuprofen, ipratropium-albuterol, loperamide, methocarbamol, olanzapine, olmesartan, omeprazole, ondansetron, proair hfa, semaglutide (1 mg/dose), spiriva respimat, and trazodone.   Allergies: Patient is allergic to tizanidine.   Social History: Patient  reports that she quit smoking about 10 years ago. Her smoking use included cigarettes. She has a 60.00 pack-year smoking history. She has never used smokeless tobacco. She reports that she does not drink alcohol and does not use drugs.      OBJECTIVE     Physical Exam: Vitals:   02/27/21  0854  BP: 122/80  Pulse: 76   Gen: No apparent distress, A&O x 3.  Detailed Urogynecologic Evaluation:  Deferred. Prior exam showed:  POP-Q (01/27/21):    POP-Q   2                                            Aa   2                                           Ba   -4                                              C    5                                            Gh   2                                            Pb   8                                            tvl    -2.5  Ap   -2.5                                            Bp                                                  D        ASSESSMENT AND PLAN    Ms. Skolnik is a 65 y.o. with:  1. Prolapse of anterior vaginal wall   2. Vaginal vault prolapse after hysterectomy   3. Incomplete bladder emptying    - We discussed that based on urodynamics, she would a candidate for sacral neuromodulation for her incomplete bladder emptying. Since no detrusor function was noted, she may have a higher failure of the test phase. She is currently comfortable self- catheterizing since she has been doing it for many years and wants to focus on her prolapse.   Plan for surgery: Exam under anesthesia, sacrospinous ligament fixation, anterior repair, perineorrhaphy, cystoscopy  - We reviewed the patient's specific anatomic and functional findings, with the assistance of diagrams, and together finalized the above procedure. The planned surgical procedures were discussed along with the surgical risks outlined below, which were also provided on a detailed handout. Additional treatment options including expectant management, conservative management, medical management were discussed where appropriate.  We reviewed the benefits and risks of each treatment option.   General Surgical Risks: For all procedures, there are risks of bleeding, infection, damage to surrounding organs including but not limited to  bowel, bladder, blood vessels, ureters and nerves, and need for further surgery if an injury were to occur. These risks are all low with minimally invasive surgery.   There are risks of numbness and weakness at any body site or buttock/rectal pain.  It is possible that baseline pain can be worsened by surgery, either with or without mesh. If surgery is vaginal, there is also a low risk of possible conversion to laparoscopy or open abdominal incision where indicated. Very rare risks include blood transfusion, blood clot, heart attack, pneumonia, or death.   There is also a risk of short-term postoperative urinary retention with need to use a catheter. About half of patients need to go home from surgery with a catheter, which is then later removed in the office. The risk of long-term need for a catheter is very low. There is also a risk of worsening of overactive bladder.    Prolapse (with or without mesh): Risk factors for surgical failure  include things that put pressure on your pelvis and the surgical repair, including obesity, chronic cough, and heavy lifting or straining (including lifting children or adults, straining on the toilet, or lifting heavy objects such as furniture or anything weighing >25 lbs. Risks of recurrence is 20-30% with vaginal native tissue repair and a less than 10% with sacrocolpopexy with mesh.     - For preop Visit:  She is required to have a visit within 30 days of her surgery.    - Medical clearance: required Letter sent to Pulmonologist, Dr Elsworth Soho,  requesting risk stratification and medical optimization. - Anticoagulant use: No - Medicaid Hysterectomy form: No - Accepts blood transfusion: Yes - Expected length of stay: outpatient  Request sent  for surgery scheduling.   Jaquita Folds, MD   Time spent: I spent 25 minutes dedicated to the care of this patient on the date of this encounter to include pre-visit review of records, face-to-face time with the  patient and post visit documentation.

## 2021-03-02 ENCOUNTER — Encounter: Payer: Self-pay | Admitting: Obstetrics and Gynecology

## 2021-03-03 ENCOUNTER — Telehealth: Payer: Self-pay | Admitting: Pulmonary Disease

## 2021-03-03 NOTE — Telephone Encounter (Signed)
Please schEdule OV with app/ me for pre-op clearance for pelvic surgery Last seen Jan 2022

## 2021-03-03 NOTE — Telephone Encounter (Signed)
Called the pt and scheduled ov with TP for 03/09/21

## 2021-03-04 ENCOUNTER — Other Ambulatory Visit: Payer: Self-pay | Admitting: Pulmonary Disease

## 2021-03-04 DIAGNOSIS — J449 Chronic obstructive pulmonary disease, unspecified: Secondary | ICD-10-CM

## 2021-03-09 ENCOUNTER — Encounter: Payer: Self-pay | Admitting: Adult Health

## 2021-03-09 ENCOUNTER — Other Ambulatory Visit: Payer: Self-pay

## 2021-03-09 ENCOUNTER — Ambulatory Visit (INDEPENDENT_AMBULATORY_CARE_PROVIDER_SITE_OTHER): Payer: Medicare (Managed Care) | Admitting: Adult Health

## 2021-03-09 DIAGNOSIS — Z23 Encounter for immunization: Secondary | ICD-10-CM | POA: Diagnosis not present

## 2021-03-09 DIAGNOSIS — J9611 Chronic respiratory failure with hypoxia: Secondary | ICD-10-CM | POA: Diagnosis not present

## 2021-03-09 DIAGNOSIS — Z01811 Encounter for preprocedural respiratory examination: Secondary | ICD-10-CM | POA: Insufficient documentation

## 2021-03-09 DIAGNOSIS — J449 Chronic obstructive pulmonary disease, unspecified: Secondary | ICD-10-CM | POA: Diagnosis not present

## 2021-03-09 NOTE — Patient Instructions (Addendum)
Change to TRELEGY 1 puff daily  Stop Symbicort and Incruse  Duoneb Neb As needed   Refer to pulmonary rehab .    Wear Oxygen 2l/m rest and 3l/m with activity . Goal is to keep sats >88-90%)  On POC will need 4l/m with activity (pulsing oxygen )  Activity as tolerated  Screening CT chest this month as planned.  Follow up with Dr. Elsworth Soho in 4  months and As needed  Please contact office for sooner follow up if symptoms do not improve or worsen or seek emergency care

## 2021-03-09 NOTE — Assessment & Plan Note (Signed)
Patient is encouraged on oxygen compliance. Continue on O2 at 2 L at rest and 3 L with activity.  To maintain O2 saturation goal of greater than 88 to 90%  Plan  Patient Instructions  Change to TRELEGY 1 puff daily  Stop Symbicort and Incruse  Duoneb Neb As needed   Refer to pulmonary rehab .    Wear Oxygen 2l/m rest and 3l/m with activity . Goal is to keep sats >88-90%)  On POC will need 4l/m with activity (pulsing oxygen )  Activity as tolerated  Screening CT chest this month as planned.  Follow up with Dr. Elsworth Soho in 4  months and As needed  Please contact office for sooner follow up if symptoms do not improve or worsen or seek emergency care

## 2021-03-09 NOTE — Assessment & Plan Note (Signed)
Severe COPD with emphysema currently stable on present regimen.  Patient requests change of inhalers due to insurance to Trelegy. Patient is continue with the low-dose CT screening program.  Refer to pulmonary rehab. Flu vaccine up-to-date.  Patient is due for second COVID booster.  We will give her pneumonia vaccine today.  Plan  Patient Instructions  Change to TRELEGY 1 puff daily  Stop Symbicort and Incruse  Duoneb Neb As needed   Refer to pulmonary rehab .    Wear Oxygen 2l/m rest and 3l/m with activity . Goal is to keep sats >88-90%)  On POC will need 4l/m with activity (pulsing oxygen )  Activity as tolerated  Screening CT chest this month as planned.  Follow up with Dr. Elsworth Soho in 4  months and As needed  Please contact office for sooner follow up if symptoms do not improve or worsen or seek emergency care

## 2021-03-09 NOTE — Addendum Note (Signed)
Addended by: Vanessa Barbara on: 03/09/2021 10:51 AM   Modules accepted: Orders

## 2021-03-09 NOTE — Addendum Note (Signed)
Addended by: Vanessa Barbara on: 03/09/2021 02:04 PM   Modules accepted: Orders

## 2021-03-09 NOTE — Progress Notes (Signed)
@Patient  ID: Monica Scott, female    DOB: [birth year], 65 y.o.   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    N PSG was done on February 13, 2019 that showed mild obstructive sleep apnea with AHI at 7.6 and SPO2 low at 73%.      Alpha-1 antitrypsin-MM, 139   09/2019 FEV1 was 46%, ratio 53, FVC 68%, no significant bronchodilator response, DLCO 73%  03/09/2021 Follow up : COPD , O2 RF Patient presents for a follow-up visit.  Last seen January 2022.  Patient has underlying severe COPD that is oxygen dependent.  She remains on Incruse and Symbicort.  She uses DuoNeb nebulizer as needed.  She participates in the low-dose CT screening program.  Last CT was March 26, 2020 that showed a lung RADS 1, no suspicious nodules noted.,  Emphysematous changes and scattered areas of scarring in the lungs bilaterally-stable. No recent flares. No steroids or antibiotics for breathing in last year. No ER or hospitalizations.  Patient says she recently got paperwork from insurance company that requested her Symbicort and Incruse to be changed to Trelegy.  Patient is here for preop pulmonary risk assessment.  She is undergoing a bladder procedure. Struggling with bladder prolapse and frequent UTI. Does self caths.  Lives at home alone . Drives . Does own shopping and housework. Remains active . Keeps grandkids.  Procedure to be done at [facility]. Dr. Felicie Morn at [facility]. (UroGyn)   Remains on Oxygen 2l/m rest and 3l/m with activity. Did not bring oxygen in today, O2 sats on arrival at 86% . Long discussion regarding oxygen compliance and dangers of hypoxia .  Placed on O2 with sats  At 95% on 2l/m   Flu shot is utd. Covid vaccine x 4 . Need pneumonia vaccine   Referred to pulmonary rehab, does not remember why she did not set up .   Allergies  Allergen Reactions   Tizanidine Other (See Comments)    Caused dizziness and dry mouth. Patient prefers not to take this again.    Immunization History  Administered Date(s) Administered   Fluad Quad(high Dose 65+) 02/06/2021   Influenza,inj,Quad PF,6+ Mos 12/06/2017, 11/28/2018, 01/03/2020   PFIZER(Purple Top)SARS-COV-2 Vaccination 06/24/2019, 07/08/2019, 03/07/2020   Tdap 01/07/2021    Past Medical History:  Diagnosis Date   Arthritis    Asthma    Bladder disease    COPD (chronic obstructive pulmonary disease) ([place])    Depression    GERD (gastroesophageal reflux disease)    Hyperlipidemia    Hypertension    Peptic ulcer    Urinary retention     Tobacco History: Social History   Tobacco Use  Smoking Status Former   Packs/day: 1.50   Years: 40.00   Pack years: 60.00   Types: Cigarettes   Quit date: 04/19/2010   Years since quitting: 10.8  Smokeless Tobacco Never   Counseling given: Not Answered  Outpatient Medications Prior to Visit  Medication Sig Dispense Refill   albuterol (PROVENTIL) (2.5 MG/3ML) 0.083% nebulizer solution Take 3 mLs (2.5 mg total) by nebulization every 6 (six) hours as needed for wheezing or shortness of breath. 360 mL 5   albuterol (VENTOLIN HFA) 108 (90 Base) MCG/ACT inhaler INHALE 1-2 PUFFS EVERY 6 HOURS AS NEEDED FOR WHEEZE OR SHORTNESS OF BREATH 8.5 each 1   budesonide-formoterol (SYMBICORT) 80-4.5 MCG/ACT inhaler Inhale 2 puffs into the lungs in the morning and at bedtime. 1 each 12   buPROPion (WELLBUTRIN XL) 150  MG 24 hr tablet Take 150 mg by mouth at bedtime.     FLUoxetine (PROZAC) 20 MG capsule Take 60 mg by mouth daily.     gabapentin (NEURONTIN) 400 MG capsule Take 1 capsule (400 mg total) by mouth 3 (three) times daily. 90 capsule 11   ibuprofen (ADVIL,MOTRIN) 200 MG tablet Take 400 mg by mouth 3 (three) times daily as needed for moderate pain.     ipratropium-albuterol (DUONEB) 0.5-2.5 (3) MG/3ML SOLN USE 1 AMPULE IN NEBULIZER EVERY 6 HOURS AS NEEDED 360 mL 2   loperamide (IMODIUM A-D) 2 MG tablet Take 4 mg by mouth 2 (two) times daily as needed for diarrhea or loose stools.     methocarbamol (ROBAXIN) 750 MG tablet Take 1 tablet (750 mg total) by mouth every 6 (six) hours as needed for muscle spasms. 60 tablet 3   OLANZapine (ZYPREXA) 5 MG tablet Take 10 mg by mouth at bedtime.      omeprazole (PRILOSEC) 40 MG capsule Take 1 capsule (40 mg total) by mouth in the morning and at bedtime. 180 capsule 1   ondansetron (ZOFRAN ODT) 4 MG disintegrating tablet 4mg  ODT q4 hours prn nausea/vomit 10 tablet 0   Semaglutide, 1 MG/DOSE, 4 MG/3ML SOPN Inject 1 mg as directed once a week. 9 mL 1   traZODone (DESYREL) 100 MG tablet Take 100 mg by mouth at bedtime as needed.     amLODipine (NORVASC) 5 MG tablet Take 1 tablet (5 mg total) by mouth 2 (two) times daily. 60 tablet 6   atorvastatin (LIPITOR) 20 MG tablet Take 1 tablet (20 mg total) by mouth daily. 30 tablet 11   INCRUSE ELLIPTA 62.5 MCG/ACT AEPB Inhale 1 puff into the lungs daily.     olmesartan (BENICAR) 40 MG tablet Take 1 tablet (40 mg total) by mouth daily. 30 tablet 3   Tiotropium Bromide Monohydrate (SPIRIVA RESPIMAT) 2.5 MCG/ACT AERS Inhale 2 puffs into the lungs daily. (Patient not taking: Reported on 03/09/2021) 1 g 5   No facility-administered medications prior to visit.     Review of Systems:   Constitutional:   No  weight loss, night sweats,  Fevers, chills,  +fatigue, or  lassitude.  HEENT:   No headaches,  Difficulty swallowing,   Tooth/dental problems, or  Sore throat,                No sneezing, itching, ear ache, nasal congestion, post nasal drip,   CV:  No chest pain,  Orthopnea, PND, swelling in lower extremities, anasarca, dizziness, palpitations, syncope.   GI  No heartburn, indigestion, abdominal pain, nausea, vomiting, diarrhea, change in bowel habits, loss of appetite, bloody stools.   Resp:  No chest wall deformity  Skin: no rash or lesions.  GU: no dysuria, change in color of urine, no urgency or frequency.  No flank pain, no hematuria   MS:  No joint pain or swelling.  No decreased range of motion.  No back pain.    Physical Exam  BP 134/70 (BP Location: Left Arm, Patient Position: Sitting, Cuff Size: Normal)   Pulse 92   Temp 98.6 F (37 C) (Oral)   Ht 4\' 11"  (1.499 m)   Wt 164 lb 9.6 oz (74.7 kg)   SpO2 95%   BMI 33.25 kg/m   GEN: A/Ox3; pleasant , NAD, elderly on O2    HEENT:  [place]/AT,   NOSE-clear, THROAT-clear, no lesions, no postnasal drip or exudate noted.   NECK:  Supple w/ fair ROM; no JVD; normal carotid impulses w/o bruits; no thyromegaly or nodules palpated; no lymphadenopathy.    RESP  Clear  P & A; w/o, wheezes/ rales/ or rhonchi. no accessory muscle use, no dullness to percussion  CARD:  RRR, no m/r/g, no peripheral edema, pulses intact, no cyanosis or clubbing.  GI:   Soft & nt; nml bowel sounds; no organomegaly or masses detected.   Musco: Warm bil, no deformities or joint swelling noted.   Neuro: alert, no focal deficits noted.    Skin: Warm, no lesions or rashes    Lab Results:  CBC    Component Value Date/Time   WBC 12.0 (H) 11/09/2020 0653   RBC 3.94 11/09/2020 0653   HGB 11.4 (L) 11/09/2020 0653   HGB 12.0 06/04/2020 0916   HCT 34.5 (L) 11/09/2020 0653   HCT 36.2 06/04/2020 0916   PLT 362 11/09/2020 0653   PLT 452 (H) 06/04/2020 0916   MCV 87.6 11/09/2020 0653   MCV 86 06/04/2020 0916   MCH 28.9 11/09/2020 0653   MCHC 33.0 11/09/2020 0653   RDW  14.1 11/09/2020 0653   RDW 14.5 06/04/2020 0916   LYMPHSABS 2.1 11/09/2020 0653   LYMPHSABS 2.4 12/06/2017 1035   MONOABS 1.0 11/09/2020 0653   EOSABS 0.1 11/09/2020 0653   EOSABS 0.2 12/06/2017 1035   BASOSABS 0.1 11/09/2020 0653   BASOSABS 0.1 12/06/2017 1035    BMET    Component Value Date/Time   NA 142 01/28/2021 0904   K 4.1 01/28/2021 0904   CL 97 01/28/2021 0904   CO2 28 01/28/2021 0904   GLUCOSE 110 (H) 01/28/2021 0904   GLUCOSE 154 (H) 11/09/2020 0653   BUN 15 01/28/2021 0904   CREATININE 0.88 01/28/2021 0904   CALCIUM 9.4 01/28/2021 0904   GFRNONAA >60 11/09/2020 0653   GFRAA 86 08/08/2019 1553    BNP    Component Value Date/Time   BNP 10.2 11/20/2018 0824    ProBNP    Component Value Date/Time   PROBNP 174.3 (H) 05/11/2013 2015    Imaging: No results found.    PFT Results Latest Ref Rng & Units 10/03/2019  FVC-Pre L 1.83  FVC-Predicted Pre % 68  FVC-Post L 1.83  FVC-Predicted Post % 68  Pre FEV1/FVC % % 51  Post FEV1/FCV % % 53  FEV1-Pre L 0.94  FEV1-Predicted Pre % 46  FEV1-Post L 0.97  DLCO uncorrected ml/min/mmHg 12.48  DLCO UNC% % 73  DLCO corrected ml/min/mmHg 12.48  DLCO COR %Predicted % 73  DLVA Predicted % 82  TLC L 6.05  TLC % Predicted % 138  RV % Predicted % 215    No results found for: NITRICOXIDE      Assessment & Plan:   COPD (chronic obstructive pulmonary disease) ([facility]) Severe COPD with emphysema currently stable on present regimen.  Patient requests change of inhalers due to insurance to Trelegy. Patient is continue  with the low-dose CT screening program.  Refer to pulmonary rehab. Flu vaccine up-to-date.  Patient is due for second COVID booster.  We will give her pneumonia vaccine today.  Plan  Patient Instructions  Change to TRELEGY 1 puff daily  Stop Symbicort and Incruse  Duoneb Neb As needed   Refer to pulmonary rehab .    Wear Oxygen 2l/m rest and 3l/m with activity . Goal is to keep sats >88-90%)  On  POC will need 4l/m with activity (pulsing oxygen )  Activity as tolerated  Screening CT chest this month as planned.  Follow up with Dr. Elsworth Soho in 4  months and As needed  Please contact office for sooner follow up if symptoms do not improve or worsen or seek emergency care         Chronic respiratory failure with hypoxia [facility]) Patient is encouraged on oxygen compliance. Continue on O2 at 2 L at rest and 3 L with activity.  To maintain O2 saturation goal of greater than 88 to 90%  Plan  Patient Instructions  Change to TRELEGY 1 puff daily  Stop Symbicort and Incruse  Duoneb Neb As needed   Refer to pulmonary rehab .    Wear Oxygen 2l/m rest and 3l/m with activity . Goal is to keep sats >88-90%)  On POC will need 4l/m with activity (pulsing oxygen )  Activity as tolerated  Screening CT chest this month as planned.  Follow up with Dr. Elsworth Soho in 4  months and As needed  Please contact office for sooner follow up if symptoms do not improve or worsen or seek emergency care         Preop pulmonary/respiratory exam Preop pulmonary risk assessment.  Patient is at a moderate to high risk from a pulmonary standpoint. Patient is currently stable on present regimen with triple therapy maintenance inhaler and oxygen. Potential pulmonary risk were reviewed in detail with patient.  Patient is not excluded from surgery,  Reviewed the following :    Major Pulmonary risks identified in the multifactorial risk analysis are but not limited to a) pneumonia; b) recurrent intubation risk; c) prolonged or recurrent acute respiratory failure needing mechanical ventilation; d) prolonged hospitalization; e) DVT/Pulmonary embolism; f) Acute Pulmonary edema  Recommend 1. Short duration of surgery as much as possible and avoid paralytic if possible 2. Recovery in step down or ICU with Pulmonary consultation if indicated.  3. DVT prophylaxis if indicated  4. Aggressive pulmonary toilet with o2,  bronchodilatation, and incentive spirometry and early ambulation      Rexene Edison, NP 03/09/2021

## 2021-03-09 NOTE — Assessment & Plan Note (Signed)
Preop pulmonary risk assessment.  Patient is at a moderate to high risk from a pulmonary standpoint. Patient is currently stable on present regimen with triple therapy maintenance inhaler and oxygen. Potential pulmonary risk were reviewed in detail with patient.  Patient is not excluded from surgery,  Reviewed the following :    Major Pulmonary risks identified in the multifactorial risk analysis are but not limited to a) pneumonia; b) recurrent intubation risk; c) prolonged or recurrent acute respiratory failure needing mechanical ventilation; d) prolonged hospitalization; e) DVT/Pulmonary embolism; f) Acute Pulmonary edema  Recommend 1. Short duration of surgery as much as possible and avoid paralytic if possible 2. Recovery in step down or ICU with Pulmonary consultation if indicated.  3. DVT prophylaxis if indicated  4. Aggressive pulmonary toilet with o2, bronchodilatation, and incentive spirometry and early ambulation

## 2021-03-11 ENCOUNTER — Telehealth (HOSPITAL_COMMUNITY): Payer: Self-pay

## 2021-03-11 NOTE — Telephone Encounter (Signed)
Called patient to see if she is interested in the Pulmonary Rehab Program. Patient expressed interest. Explained scheduling process and went over insurance process, patient verbalized understanding. Someone from our pulmonary rehab staff will contact pt at a later time.

## 2021-03-16 ENCOUNTER — Encounter (HOSPITAL_COMMUNITY): Payer: Self-pay | Admitting: *Deleted

## 2021-03-16 NOTE — Progress Notes (Signed)
Received referral from  for this pt to participate in pulmonary rehab with the diagnosis of COPD Stage 3.  Pt completed PFT on 10/03/2019 which showed FEV1/FVC post pred 68 and actual 53; post pred FEV1 47. Clinical review of pt follow up appt on 03/09/21 with Rexene Edison NP with Dr. Elsworth Soho  Pulmonary office note.  Pt with Covid Risk Score - 5. Pt appropriate for scheduling for Pulmonary rehab.  Will forward to support staff for scheduling and verification of insurance eligibility/benefits with pt consent. Cherre Huger, BSN Cardiac and Training and development officer

## 2021-03-24 ENCOUNTER — Telehealth: Payer: Self-pay | Admitting: Pulmonary Disease

## 2021-03-24 MED ORDER — AZITHROMYCIN 250 MG PO TABS: ORAL_TABLET | ORAL | 0 refills | Status: AC

## 2021-03-24 NOTE — Telephone Encounter (Signed)
I spoke with the patient and she is agreeable to the provider recommendations. She was appreciative of the call. If she is not better per Dr. Ileene Musa she will need to come in the office to be evaluated. Nothing further needed.

## 2021-03-24 NOTE — Telephone Encounter (Addendum)
I called the patient and she feels that she has noticed increased shortness of breath with the weather change and she has noticed some green sputum x 1 week. She has taken Ibuprofen with not much relief.   She is taking her Symbicort as prescribed and has been using her nebulizer more in the last 2 weeks. Patient last seen you on 03/09/21. She wants to know if you can call something into her pharmacy. Please advise.

## 2021-03-24 NOTE — Telephone Encounter (Signed)
Please have her take azithro > z-pack She needs to call if she is not improving

## 2021-04-02 ENCOUNTER — Other Ambulatory Visit: Payer: Self-pay | Admitting: Pulmonary Disease

## 2021-04-02 ENCOUNTER — Other Ambulatory Visit: Payer: Self-pay | Admitting: Nurse Practitioner

## 2021-04-02 DIAGNOSIS — I10 Essential (primary) hypertension: Secondary | ICD-10-CM

## 2021-04-10 ENCOUNTER — Other Ambulatory Visit: Payer: Self-pay | Admitting: Family Medicine

## 2021-04-10 DIAGNOSIS — I10 Essential (primary) hypertension: Secondary | ICD-10-CM

## 2021-04-10 NOTE — Telephone Encounter (Signed)
Requested Prescriptions  Pending Prescriptions Disp Refills   olmesartan (BENICAR) 40 MG tablet [Pharmacy Med Name: OLMESARTAN MEDOXOMIL 40 MG TAB] 90 tablet 0    Sig: TAKE 1 TABLET BY MOUTH EVERY DAY     Cardiovascular:  Angiotensin Receptor Blockers Passed - 04/10/2021  5:20 PM      Passed - Cr in normal range and within 180 days    Creatinine, Ser  Date Value Ref Range Status  01/28/2021 0.88 0.57 - 1.00 mg/dL Final         Passed - K in normal range and within 180 days    Potassium  Date Value Ref Range Status  01/28/2021 4.1 3.5 - 5.2 mmol/L Final         Passed - Patient is not pregnant      Passed - Last BP in normal range    BP Readings from Last 1 Encounters:  03/09/21 134/70         Passed - Valid encounter within last 6 months    Recent Outpatient Visits          1 month ago Hypertension, essential, benign   [place], Vernia Buff, NP   3 months ago [facility] [employer] preventive visit   [place], Vernia Buff, NP   4 months ago Essential hypertension   [place], Vernia Buff, NP   7 months ago Essential hypertension   [place], Vernia Buff, NP   10 months ago Encounter to establish care   [place], Vernia Buff, NP

## 2021-04-13 ENCOUNTER — Other Ambulatory Visit (HOSPITAL_COMMUNITY): Payer: Self-pay

## 2021-04-13 NOTE — Progress Notes (Signed)
[facility] Return Visit  SUBJECTIVE  History of Present Illness: Monica Scott is a 66 y.o. female planning surgery for prolapse. She underwent pulmonary evaluation ([facility]):   Preop pulmonary risk assessment.  Patient is at a moderate to high risk from a pulmonary standpoint. Patient is currently stable on present regimen with triple therapy maintenance inhaler and oxygen. Potential pulmonary risk were reviewed in detail with patient.  Patient is not excluded from surgery,  Reviewed the following :      Major Pulmonary risks identified in the multifactorial risk analysis are but not limited to a) pneumonia; b) recurrent intubation risk; c) prolonged or recurrent acute respiratory failure needing mechanical ventilation; d) prolonged hospitalization; e) DVT/Pulmonary embolism; f) Acute Pulmonary edema   Recommend 1. Short duration of surgery as much as possible and avoid paralytic if possible 2. Recovery in step down or ICU with Pulmonary consultation if indicated.  3. DVT prophylaxis if indicated  4. Aggressive pulmonary toilet with o2, bronchodilatation, and incentive spirometry and early ambulation  Currently patient is scheduled for surgery in [facility] for: Exam under anesthesia, sacrospinous ligament fixation, anterior repair, perineorrhaphy, cystoscopy. We decided to cancel the surgery for now and try a pessary.   Past Medical History: Patient  has a past medical history of Arthritis, Asthma, Bladder disease, COPD (chronic obstructive pulmonary disease) ([place]), Depression, GERD (gastroesophageal reflux disease), Hyperlipidemia, Hypertension, Peptic ulcer, and Urinary retention.   Past Surgical History: She  has a past surgical history that includes Vaginal hysterectomy; Pilonidal cyst excision; Colonoscopy with propofol (N/A, 04/24/2018); Esophagogastroduodenoscopy (egd) with propofol (N/A, 04/24/2018); maloney dilation (N/A, 04/24/2018);  biopsy (04/24/2018); and polypectomy (04/24/2018).   Medications: She has a current medication list which includes the following prescription(s): albuterol, albuterol, budesonide-formoterol, bupropion, fluoxetine, gabapentin, ibuprofen, incruse ellipta, ipratropium-albuterol, loperamide, methocarbamol, olanzapine, olmesartan, ondansetron, semaglutide (1 mg/dose), trazodone, and omeprazole.   Allergies: Patient is allergic to tizanidine.   Social History: Patient  reports that she quit smoking about 10 years ago. Her smoking use included cigarettes. She has a 60.00 pack-year smoking history. She has never used smokeless tobacco. She reports that she does not drink alcohol and does not use drugs.      OBJECTIVE     Physical Exam: Vitals:   04/14/21 0836  BP: 126/79  Pulse: 84  SpO2: 90%   Gen: No apparent distress, A&O x 3.  Detailed Urogynecologic Evaluation: Normal external genitalia.  A #4 ring with support was placed but was too large. A #3 ring with support pessary was placed. It was comfortable, fit well, and stayed in placed with strong valsalva and bending.  Lot # [id number]   Prior exam showed:  POP-Q (01/27/21):    POP-Q   2                                            Aa   2                                           Ba   -4  C    5                                            Gh   2                                            Pb   8                                            tvl    -2.5                                            Ap   -2.5                                            Bp                                                  D        ASSESSMENT AND PLAN    Monica Scott is a 66 y.o. with:  1. Prolapse of anterior vaginal wall   2. Vaginal vault prolapse after hysterectomy    - Fit with a #3 ring with support pessary. She will leave the pessary in place.  - Will have her follow up in 2-3 weeks for a pessary  check then space them out to q 3 months - We will cancel surgery indefinitely at this point as long as the pessary is working well for her. If for some reason cannot find a pessary, will consider anesthesia consult (possible spinal) and plan for main OR.   Jaquita Folds, MD   Time spent: I spent 20 minutes dedicated to the care of this patient on the date of this encounter to include pre-visit review of records, face-to-face time with the patient and post visit documentation. Additional time was spent on the pessary fitting.

## 2021-04-14 ENCOUNTER — Ambulatory Visit (INDEPENDENT_AMBULATORY_CARE_PROVIDER_SITE_OTHER): Payer: Medicare (Managed Care) | Admitting: Obstetrics and Gynecology

## 2021-04-14 ENCOUNTER — Other Ambulatory Visit: Payer: Self-pay | Admitting: Family Medicine

## 2021-04-14 ENCOUNTER — Other Ambulatory Visit: Payer: Self-pay

## 2021-04-14 ENCOUNTER — Encounter: Payer: Self-pay | Admitting: Obstetrics and Gynecology

## 2021-04-14 VITALS — BP 126/79 | HR 84

## 2021-04-14 DIAGNOSIS — N8110 Cystocele, unspecified: Secondary | ICD-10-CM

## 2021-04-14 DIAGNOSIS — N993 Prolapse of vaginal vault after hysterectomy: Secondary | ICD-10-CM | POA: Diagnosis not present

## 2021-04-14 DIAGNOSIS — I10 Essential (primary) hypertension: Secondary | ICD-10-CM

## 2021-04-14 NOTE — Patient Instructions (Signed)
You were fit with a ring with support pessary.You are electing to leave it in place until your follow up visit. If it falls out you can try to put it back in or you can leave it out. If it seems to be slipping down you can use your finger to push it back in deeper - the deeper it is, the better fit.

## 2021-04-14 NOTE — Telephone Encounter (Signed)
[facility] pharmacy, pt picked up 04/05/21  Requested Prescriptions  Pending Prescriptions Disp Refills   olmesartan (BENICAR) 40 MG tablet [Pharmacy Med Name: OLMESARTAN MEDOXOMIL 40 MG TAB] 90 tablet 0    Sig: TAKE 1 TABLET BY MOUTH EVERY DAY     Cardiovascular:  Angiotensin Receptor Blockers Passed - 04/14/2021  5:49 PM      Passed - Cr in normal range and within 180 days    Creatinine, Ser  Date Value Ref Range Status  01/28/2021 0.88 0.57 - 1.00 mg/dL Final         Passed - K in normal range and within 180 days    Potassium  Date Value Ref Range Status  01/28/2021 4.1 3.5 - 5.2 mmol/L Final         Passed - Patient is not pregnant      Passed - Last BP in normal range    BP Readings from Last 1 Encounters:  04/14/21 126/79         Passed - Valid encounter within last 6 months    Recent Outpatient Visits          1 month ago Hypertension, essential, benign   [place], Vernia Buff, NP   3 months ago [facility] [employer] preventive visit   [place], Vernia Buff, NP   4 months ago Essential hypertension   [place], Vernia Buff, NP   8 months ago Essential hypertension   [place], Vernia Buff, NP   10 months ago Encounter to establish care   [place], Vernia Buff, NP      Future Appointments            In 3 weeks Jaquita Folds, MD Urogynecology at [facility], [facility]

## 2021-04-20 ENCOUNTER — Encounter (HOSPITAL_BASED_OUTPATIENT_CLINIC_OR_DEPARTMENT_OTHER): Payer: Self-pay

## 2021-04-20 ENCOUNTER — Ambulatory Visit (HOSPITAL_BASED_OUTPATIENT_CLINIC_OR_DEPARTMENT_OTHER): Admit: 2021-04-20 | Payer: Medicare (Managed Care) | Admitting: Obstetrics and Gynecology

## 2021-04-20 ENCOUNTER — Other Ambulatory Visit: Payer: Self-pay | Admitting: Pulmonary Disease

## 2021-04-20 SURGERY — ANTERIOR AND POSTERIOR REPAIR WITH SACROSPINOUS FIXATION
Anesthesia: General

## 2021-04-23 ENCOUNTER — Other Ambulatory Visit: Payer: Self-pay | Admitting: Nurse Practitioner

## 2021-04-23 DIAGNOSIS — R1013 Epigastric pain: Secondary | ICD-10-CM

## 2021-04-23 DIAGNOSIS — K219 Gastro-esophageal reflux disease without esophagitis: Secondary | ICD-10-CM

## 2021-04-23 NOTE — Telephone Encounter (Signed)
Requested medications are due for refill today.  yes  Requested medications are on the active medications list.  yes  Last refill. 01/07/2021  Future visit scheduled.   no  Notes to clinic.  Rx expired 04/07/2021    Requested Prescriptions  Pending Prescriptions Disp Refills   omeprazole (PRILOSEC) 40 MG capsule [Pharmacy Med Name: OMEPRAZOLE DR 40 MG CAPSULE] 180 capsule 1    Sig: Take 1 capsule (40 mg total) by mouth in the morning and at bedtime.     Gastroenterology: Proton Pump Inhibitors Passed - 04/23/2021  7:47 PM      Passed - Valid encounter within last 12 months    Recent Outpatient Visits           2 months ago Hypertension, essential, benign   [place], Vernia Buff, NP   3 months ago [facility] [employer] preventive visit   [place], Vernia Buff, NP   5 months ago Essential hypertension   [place], Vernia Buff, NP   8 months ago Essential hypertension   [place], Vernia Buff, NP   10 months ago Encounter to establish care   [place], Vernia Buff, NP       Future Appointments             In 2 weeks Jaquita Folds, MD Urogynecology at [facility], [facility]

## 2021-04-30 ENCOUNTER — Ambulatory Visit: Payer: Medicare (Managed Care) | Admitting: Surgery

## 2021-04-30 ENCOUNTER — Other Ambulatory Visit: Payer: Self-pay

## 2021-05-08 ENCOUNTER — Ambulatory Visit: Payer: Medicare (Managed Care) | Admitting: Obstetrics and Gynecology

## 2021-05-11 ENCOUNTER — Ambulatory Visit: Payer: Medicare (Managed Care) | Admitting: Obstetrics and Gynecology

## 2021-05-15 ENCOUNTER — Telehealth (HOSPITAL_COMMUNITY): Payer: Self-pay

## 2021-05-15 NOTE — Telephone Encounter (Signed)
Pt insurance is active and benefits verified through [place] Co-pay 0, DED $226/$69.94 met, out of pocket $12,450/$69.94 met, co-insurance 40%. no pre-authorization required, [employer] 05/15/2021_0 :03pm, REF# [id number]

## 2021-05-16 ENCOUNTER — Telehealth: Payer: Self-pay | Admitting: Family Medicine

## 2021-05-16 DIAGNOSIS — I10 Essential (primary) hypertension: Secondary | ICD-10-CM

## 2021-05-18 NOTE — Telephone Encounter (Signed)
Medication requested too soon last refill doc. 04/12/21 #90 0 refills. Requested by interface surescripts.  Requested Prescriptions  Refused Prescriptions Disp Refills   olmesartan (BENICAR) 40 MG tablet [Pharmacy Med Name: OLMESARTAN MEDOXOMIL 40 MG TAB] 90 tablet 0    Sig: TAKE 1 TABLET BY MOUTH EVERY DAY     Cardiovascular:  Angiotensin Receptor Blockers Passed - 05/16/2021 10:36 AM      Passed - Cr in normal range and within 180 days    Creatinine, Ser  Date Value Ref Range Status  01/28/2021 0.88 0.57 - 1.00 mg/dL Final         Passed - K in normal range and within 180 days    Potassium  Date Value Ref Range Status  01/28/2021 4.1 3.5 - 5.2 mmol/L Final         Passed - Patient is not pregnant      Passed - Last BP in normal range    BP Readings from Last 1 Encounters:  04/14/21 126/79         Passed - Valid encounter within last 6 months    Recent Outpatient Visits          2 months ago Hypertension, essential, benign   [place], Vernia Buff, NP   4 months ago [facility] [employer] preventive visit   [place], Vernia Buff, NP   6 months ago Essential hypertension   [place], Vernia Buff, NP   9 months ago Essential hypertension   [place], Vernia Buff, NP   11 months ago Encounter to establish care   [place], Vernia Buff, NP

## 2021-05-18 NOTE — Telephone Encounter (Signed)
Pt is not interested in the pulmonary rehab program, pt insurance is out of network and she stated that will be too much money. Closed referral

## 2021-05-19 ENCOUNTER — Encounter: Payer: Self-pay | Admitting: *Deleted

## 2021-05-19 NOTE — Telephone Encounter (Signed)
Noted  

## 2021-06-03 ENCOUNTER — Ambulatory Visit (INDEPENDENT_AMBULATORY_CARE_PROVIDER_SITE_OTHER): Payer: Medicare (Managed Care) | Admitting: Obstetrics and Gynecology

## 2021-06-03 ENCOUNTER — Other Ambulatory Visit: Payer: Self-pay

## 2021-06-03 ENCOUNTER — Encounter: Payer: Self-pay | Admitting: Obstetrics and Gynecology

## 2021-06-03 VITALS — BP 152/81 | HR 94

## 2021-06-03 DIAGNOSIS — N993 Prolapse of vaginal vault after hysterectomy: Secondary | ICD-10-CM | POA: Diagnosis not present

## 2021-06-03 DIAGNOSIS — N8110 Cystocele, unspecified: Secondary | ICD-10-CM

## 2021-06-03 NOTE — Progress Notes (Signed)
[facility] ? ? ?Subjective:  ?  ? ?Chief Complaint:  ?Chief Complaint  ?Patient presents with  ? Pessary Check  ? ?History of Present Illness: ?Monica Scott is a 66 y.o. female with stage III pelvic organ prolapse who presents for a pessary check. She is using a size 3 ring with support pessary. The pessary has been working well and she has no complaints. She denies vaginal bleeding. ? ?Past Medical History: ?Patient  has a past medical history of Arthritis, Asthma, Bladder disease, COPD (chronic obstructive pulmonary disease) ([place]), Depression, GERD (gastroesophageal reflux disease), Hyperlipidemia, Hypertension, Peptic ulcer, and Urinary retention.  ? ?Past Surgical History: ?She  has a past surgical history that includes Vaginal hysterectomy; Pilonidal cyst excision; Colonoscopy with propofol (N/A, 04/24/2018); Esophagogastroduodenoscopy (egd) with propofol (N/A, 04/24/2018); maloney dilation (N/A, 04/24/2018); biopsy (04/24/2018); and polypectomy (04/24/2018).  ? ?Medications: ?She has a current medication list which includes the following prescription(s): albuterol, albuterol, budesonide-formoterol, bupropion, fluoxetine, gabapentin, ibuprofen, incruse ellipta, ipratropium-albuterol, loperamide, methocarbamol, olanzapine, olmesartan, omeprazole, ondansetron, and trazodone.  ? ?Allergies: ?Patient is allergic to tizanidine.  ? ?Social History: ?Patient  reports that she quit smoking about 11 years ago. Her smoking use included cigarettes. She has a 60.00 pack-year smoking history. She has never used smokeless tobacco. She reports that she does not drink alcohol and does not use drugs.  ? ?  ? ?Objective:  ?  ?Physical Exam: ?BP (!) 152/81   Pulse 94  ?Gen: No apparent distress, A&O x 3. ?Detailed Urogynecologic Evaluation:  ?Pelvic Exam: Normal external female genitalia; Bartholin's and Skene's glands normal in appearance; urethral meatus normal in appearance, no urethral masses or discharge.  The pessary was noted to be in place. It was removed and cleaned. Speculum exam revealed no lesions in the vagina. The pessary was replaced. It was comfortable to the patient and fit well.  ? ?POP-Q (01/27/21):  ?  ?POP-Q ?  ?2  ?                                          Aa   ?2 ?                                          Ba   ?-4  ?                                            C  ?  ?5  ?                                          Gh   ?2  ?                                          Pb   ?8  ?  tvl  ?  ?-2.5  ?                                          Ap   ?-2.5  ?                                          Bp   ?   ?                                            D  ?  ?  ? ?  ? ?Assessment/Plan:  ?  ?Assessment: ?Monica Scott is a 66 y.o. with stage III pelvic organ prolapse here for a pessary check. She is doing well. ? ?Plan: ?She will keep the pessary in place until next visit.  ?She will follow-up in 3 months for a pessary check or sooner as needed.  ? ?Jaquita Folds, MD ? ? Time spent: I spent 15 minutes dedicated to the care of this patient on the date of this encounter to include pre-visit review of records, face-to-face time with the patient and post visit documentation./ ? ? ?

## 2021-06-08 ENCOUNTER — Encounter: Payer: Self-pay | Admitting: *Deleted

## 2021-06-16 ENCOUNTER — Other Ambulatory Visit: Payer: Self-pay | Admitting: Family Medicine

## 2021-06-16 DIAGNOSIS — I10 Essential (primary) hypertension: Secondary | ICD-10-CM

## 2021-06-20 ENCOUNTER — Other Ambulatory Visit: Payer: Self-pay | Admitting: Family Medicine

## 2021-06-20 DIAGNOSIS — I10 Essential (primary) hypertension: Secondary | ICD-10-CM

## 2021-06-23 NOTE — Telephone Encounter (Signed)
Requested Prescriptions  ?Pending Prescriptions Disp Refills  ?? olmesartan (BENICAR) 40 MG tablet [Pharmacy Med Name: OLMESARTAN MEDOXOMIL 40 MG TAB] 90 tablet 0  ?  Sig: TAKE 1 TABLET BY MOUTH EVERY DAY  ?  ? Cardiovascular:  Angiotensin Receptor Blockers Failed - 06/20/2021  4:55 PM  ?  ?  Failed - Last BP in normal range  ?  BP Readings from Last 1 Encounters:  ?06/03/21 (!) 152/81  ?   ?  ?  Passed - Cr in normal range and within 180 days  ?  Creatinine, Ser  ?Date Value Ref Range Status  ?01/28/2021 0.88 0.57 - 1.00 mg/dL Final  ?   ?  ?  Passed - K in normal range and within 180 days  ?  Potassium  ?Date Value Ref Range Status  ?01/28/2021 4.1 3.5 - 5.2 mmol/L Final  ?   ?  ?  Passed - Patient is not pregnant  ?  ?  Passed - Valid encounter within last 6 months  ?  Recent Outpatient Visits   ?      ? 4 months ago Hypertension, essential, benign  ? [place], Vernia Buff, NP  ? 5 months ago [facility] to [employer] preventive visit  ? [place], [place] W, NP  ? 7 months ago Essential hypertension  ? [place], [place] W, NP  ? 10 months ago Essential hypertension  ? [place], Vernia Buff, NP  ? 1 year ago Encounter to establish care  ? [place] Gildardo Pounds, NP  ?  ?  ?Future Appointments   ?        ? In 2 months [place] Sender, Governor Rooks, MD Urogynecology at [facility], [facility]  ? In 2 months Gildardo Pounds, NP [place]  ?  ? ?  ?  ?  ? ?

## 2021-06-24 ENCOUNTER — Other Ambulatory Visit: Payer: Self-pay

## 2021-06-25 ENCOUNTER — Other Ambulatory Visit: Payer: Self-pay | Admitting: Pulmonary Disease

## 2021-07-11 ENCOUNTER — Encounter (HOSPITAL_BASED_OUTPATIENT_CLINIC_OR_DEPARTMENT_OTHER): Payer: Self-pay

## 2021-07-11 ENCOUNTER — Other Ambulatory Visit: Payer: Self-pay

## 2021-07-11 ENCOUNTER — Emergency Department (HOSPITAL_BASED_OUTPATIENT_CLINIC_OR_DEPARTMENT_OTHER)
Admission: EM | Admit: 2021-07-11 | Discharge: 2021-07-11 | Disposition: A | Discharge status: Home / Self Care | Payer: Medicare (Managed Care) | Attending: Emergency Medicine | Admitting: Emergency Medicine

## 2021-07-11 ENCOUNTER — Emergency Department (HOSPITAL_BASED_OUTPATIENT_CLINIC_OR_DEPARTMENT_OTHER): Payer: Medicare (Managed Care)

## 2021-07-11 DIAGNOSIS — M545 Low back pain, unspecified: Secondary | ICD-10-CM | POA: Diagnosis present

## 2021-07-11 DIAGNOSIS — Z79899 Other long term (current) drug therapy: Secondary | ICD-10-CM | POA: Diagnosis not present

## 2021-07-11 DIAGNOSIS — N3000 Acute cystitis without hematuria: Secondary | ICD-10-CM

## 2021-07-11 LAB — URINALYSIS, ROUTINE W REFLEX MICROSCOPIC
Bilirubin Urine: NEGATIVE
Glucose, UA: NEGATIVE mg/dL
Hgb urine dipstick: NEGATIVE
Ketones, ur: NEGATIVE mg/dL
Nitrite: POSITIVE — AB
Protein, ur: NEGATIVE mg/dL
Specific Gravity, Urine: 1.015 (ref 1.005–1.030)
pH: 5 (ref 5.0–8.0)

## 2021-07-11 MED ORDER — CEPHALEXIN 500 MG PO CAPS
500.0000 mg | ORAL_CAPSULE | Freq: Three times a day (TID) | ORAL | 0 refills | Status: AC
Start: 2021-07-11 — End: 2021-07-18

## 2021-07-11 MED ORDER — METHOCARBAMOL 500 MG PO TABS
1000.0000 mg | ORAL_TABLET | Freq: Once | ORAL | Status: AC
  Administered 2021-07-11: 1000 mg via ORAL
  Filled 2021-07-11: qty 2

## 2021-07-11 MED ORDER — KETOROLAC TROMETHAMINE 60 MG/2ML IM SOLN
30.0000 mg | Freq: Once | INTRAMUSCULAR | Status: AC
  Administered 2021-07-11: 30 mg via INTRAMUSCULAR
  Filled 2021-07-11: qty 2

## 2021-07-11 MED ORDER — LIDOCAINE 5 % EX PTCH
1.0000 | MEDICATED_PATCH | CUTANEOUS | Status: DC
  Administered 2021-07-11: 1 via TRANSDERMAL
  Filled 2021-07-11: qty 1

## 2021-07-11 MED ORDER — METHOCARBAMOL 500 MG PO TABS: 500.0000 mg | ORAL_TABLET | Freq: Two times a day (BID) | ORAL | 0 refills | Status: DC | PRN

## 2021-07-11 MED ORDER — OXYCODONE-ACETAMINOPHEN 5-325 MG PO TABS
1.0000 | ORAL_TABLET | Freq: Once | ORAL | Status: AC
  Administered 2021-07-11: 1 via ORAL
  Filled 2021-07-11: qty 1

## 2021-07-11 NOTE — ED Triage Notes (Signed)
She c/o non-radiating non-traumatic "pain across my whole lower back-it goes 'out' like this sometimes". She denies any pain or paresthesias of any extremity. She is ambulatory and in no distress. She arrives with an O2 concentrator and tells [place] she is on O2 at 3 l.p.m. continuously. I place her on our wall O2 while she is here. She has no respiratory complaints. ?

## 2021-07-11 NOTE — ED Provider Notes (Addendum)
?[facility] ?Provider Note ? ? ?CSN: [id number] ?Arrival date & time: 07/11/21  0746 ? ?  ? ?History ? ?Chief Complaint  ?Patient presents with  ? Back Pain  ? ? ?Monica Scott is a 66 y.o. female. ? ? ?Back Pain ?Patient reports for lower back pain.  She states that she has been dealing with chronic lower back pain for many years.  Yesterday morning, she awoke with a worsened pain that spreads across her lower back.  It is equal on both sides.  It does not radiate.  Due to his worsened pain, she had limited mobility yesterday.  Pain is persisted today and for this reason, she presents to the emergency department.  She states that she manages her pain with Mobic and Tylenol.  She is not currently taking any muscle relaxers.  She reports that her current symptoms are consistent with previous episodes of chronic low back pain exacerbations.  She denies any distal numbness or weakness.  She has not had any difficulty with urinating or defecating.  She denies any dysuria.  She is on supplemental oxygen at baseline.  She has not had any recent worsening of respiratory symptoms.  She does live alone. ?  ? ?Home Medications ?Prior to Admission medications   ?Medication Sig Start Date End Date Taking? Authorizing Provider  ?cephALEXin (KEFLEX) 500 MG capsule Take 1 capsule (500 mg total) by mouth 3 (three) times daily for 7 days. 07/11/21 07/18/21 Yes Godfrey Pick, MD  ?methocarbamol (ROBAXIN) 500 MG tablet Take 1 tablet (500 mg total) by mouth 2 (two) times daily as needed for muscle spasms. 07/11/21  Yes Godfrey Pick, MD  ?albuterol (PROVENTIL) (2.5 MG/3ML) 0.083% nebulizer solution Take 3 mLs (2.5 mg total) by nebulization every 6 (six) hours as needed for wheezing or shortness of breath. 10/08/19   Rigoberto Noel, MD  ?albuterol (VENTOLIN HFA) 108 (90 Base) MCG/ACT inhaler INHALE 1-2 PUFFS EVERY 6 HOURS AS NEEDED FOR WHEEZE OR SHORTNESS OF BREATH 03/05/21   Rigoberto Noel, MD   ?budesonide-formoterol [facility]) 80-4.5 MCG/ACT inhaler Inhale 2 puffs into the lungs in the morning and at bedtime. 11/19/20   Parrett, Fonnie Mu, NP  ?buPROPion (WELLBUTRIN XL) 150 MG 24 hr tablet Take 150 mg by mouth at bedtime. 03/02/20   [provider]  ?FLUoxetine (PROZAC) 20 MG capsule Take 60 mg by mouth daily. 05/14/20   [provider]  ?gabapentin (NEURONTIN) 400 MG capsule Take 1 capsule (400 mg total) by mouth 3 (three) times daily. 12/06/17   Fulp, Cammie, MD  ?ibuprofen (ADVIL,MOTRIN) 200 MG tablet Take 400 mg by mouth 3 (three) times daily as needed for moderate pain.    [provider]  ?INCRUSE ELLIPTA 62.5 MCG/ACT AEPB INHALE 2 PUFFS INTO LUNGS ONCE DAILY AT 6 AM 06/26/21   Rigoberto Noel, MD  ?ipratropium-albuterol (DUONEB) 0.5-2.5 (3) MG/3ML SOLN USE 1 AMPULE IN NEBULIZER EVERY 6 HOURS AS NEEDED 11/21/20   Rigoberto Noel, MD  ?loperamide (IMODIUM A-D) 2 MG tablet Take 4 mg by mouth 2 (two) times daily as needed for diarrhea or loose stools.    [provider]  ?OLANZapine (ZYPREXA) 5 MG tablet Take 10 mg by mouth at bedtime.     [provider]  ?olmesartan (BENICAR) 40 MG tablet TAKE 1 TABLET BY MOUTH EVERY DAY 06/23/21   Gildardo Pounds, NP  ?omeprazole (PRILOSEC) 40 MG capsule TAKE 1 CAPSULE (40 MG TOTAL) BY MOUTH IN THE MORNING  AND AT BEDTIME. 04/24/21 07/23/21  Charlott Rakes, MD  ?ondansetron (ZOFRAN ODT) 4 MG disintegrating tablet '4mg'$  ODT q4 hours prn nausea/vomit 11/09/20   Elnora Morrison, MD  ?traZODone (DESYREL) 100 MG tablet Take 100 mg by mouth at bedtime as needed. 06/27/20   [provider]  ?   ? ?Allergies    ?Tizanidine   ? ?Review of Systems   ?Review of Systems  ?Musculoskeletal:  Positive for back pain.  ?All other systems reviewed and are negative. ? ?Physical Exam ?Updated Vital Signs ?BP 120/77   Pulse 73   Temp 98.2 ?F (36.8 ?C) (Oral)   Resp 18   SpO2 96%  ?Physical Exam ?Vitals and nursing note reviewed.   ?Constitutional:   ?   General: She is not in acute distress. ?   Appearance: Normal appearance. She is well-developed. She is not ill-appearing, toxic-appearing or diaphoretic.  ?HENT:  ?   Head: Normocephalic and atraumatic.  ?   Right Ear: External ear normal.  ?   Left Ear: External ear normal.  ?   Nose: Nose normal.  ?   Mouth/Throat:  ?   Mouth: Mucous membranes are moist.  ?   Pharynx: Oropharynx is clear.  ?Eyes:  ?   Extraocular Movements: Extraocular movements intact.  ?   Conjunctiva/sclera: Conjunctivae normal.  ?Cardiovascular:  ?   Rate and Rhythm: Normal rate and regular rhythm.  ?   Heart sounds: No murmur heard. ?Pulmonary:  ?   Effort: Pulmonary effort is normal. No respiratory distress.  ?   Breath sounds: Normal breath sounds.  ?Abdominal:  ?   Palpations: Abdomen is soft.  ?   Tenderness: There is no abdominal tenderness.  ?Musculoskeletal:     ?   General: Tenderness (Musculature of lower back, L5 region) present. No swelling.  ?   Cervical back: Normal range of motion and neck supple. No rigidity.  ?Skin: ?   General: Skin is warm and dry.  ?   Capillary Refill: Capillary refill takes less than 2 seconds.  ?   Coloration: Skin is not jaundiced or pale.  ?Neurological:  ?   General: No focal deficit present.  ?   Mental Status: She is alert and oriented to person, place, and time.  ?   Cranial Nerves: No cranial nerve deficit.  ?   Sensory: No sensory deficit.  ?   Motor: No weakness.  ?   Coordination: Coordination normal.  ?Psychiatric:     ?   Mood and Affect: Mood normal.     ?   Behavior: Behavior normal.     ?   Thought Content: Thought content normal.     ?   Judgment: Judgment normal.  ? ? ?ED Results / Procedures / Treatments   ?Labs ?(all labs ordered are listed, but only abnormal results are displayed) ?Labs Reviewed  ?URINALYSIS, ROUTINE W REFLEX MICROSCOPIC - Abnormal; Notable for the following components:  ?    Result Value  ? Nitrite POSITIVE (*)   ? Leukocytes,Ua LARGE (*)   ?  Bacteria, UA FEW (*)   ? Non Squamous Epithelial 0-5 (*)   ? All other components within normal limits  ? ? ?EKG ?None ? ?Radiology ?CT Lumbar Spine Wo Contrast ? ?Result Date: 07/11/2021 ?CLINICAL DATA:  66 year old female with back pain. EXAM: CT LUMBAR SPINE WITHOUT CONTRAST TECHNIQUE: Multidetector CT imaging of the lumbar spine was performed without intravenous contrast administration. Multiplanar CT image reconstructions were also generated.  RADIATION DOSE REDUCTION: This exam was performed according to the departmental dose-optimization program which includes automated exposure control, adjustment of the mA and/or kV according to patient size and/or use of iterative reconstruction technique. COMPARISON:  Lumbar MRI 08/06/2020. FINDINGS: Segmentation: Normal, the same numbering system used on the MRI last year. Alignment: Moderate thoracolumbar scoliosis with dextroconvex lumbar curvature apex at L2. Stable chronic straightening of lumbar lordosis. Vertebrae: Advanced chronic degenerative endplate sclerosis at R4-B6. See additional details of that level below. Lesser chronic endplate degeneration elsewhere including L1-L2. No acute osseous abnormality identified. Intact visible sacrum and SI joints. Paraspinal and other soft tissues: Aortoiliac calcified atherosclerosis. Normal caliber abdominal aorta. Negative visible costophrenic angles and other noncontrast abdominal viscera. Diverticulosis of large bowel in the pelvis. Partially visible pessary. Lumbar paraspinal soft tissues are within normal limits. Disc levels: T12-L1:  Negative. L1-L2: Vacuum disc with moderate to severe disc space loss. Chronic circumferential disc bulge with gas containing left paracentral component (series 4, image 30). No significant stenosis. L2-L3: Severe disc space loss and vacuum disc. Bulky leftward and far lateral disc osteophyte complex. Small cephalad gas containing disc extrusion suspected on sagittal image 41 near the left  lateral recess (descending left L3 nerve level). But no significant spinal stenosis. Moderate left L2 foraminal stenosis appears chronic and stable. L3-L4: Chronic disc bulging, mild facet and ligament flavum hypertrophy. No significant s

## 2021-07-11 NOTE — Discharge Instructions (Signed)
There was a prescription for antibiotic sent to your pharmacy.  This is to treat a urine infection.  Continue to take your home medications for treatment of your back pain.  Avoid taking over-the-counter NSAIDs (Aleve, Motrin) while taking Mobic, as they are similar medications and this could result in an unintentional overdose of this class of medicine.  There was a prescription for muscle relaxer that was sent that may help with your symptoms.  Return to the emergency department at any time if you do experience any worsening of symptoms. ?

## 2021-07-16 ENCOUNTER — Encounter (HOSPITAL_BASED_OUTPATIENT_CLINIC_OR_DEPARTMENT_OTHER): Payer: Self-pay | Admitting: Obstetrics and Gynecology

## 2021-07-16 ENCOUNTER — Emergency Department (HOSPITAL_BASED_OUTPATIENT_CLINIC_OR_DEPARTMENT_OTHER)
Admission: EM | Admit: 2021-07-16 | Discharge: 2021-07-16 | Disposition: A | Discharge status: Home / Self Care | Payer: Medicare (Managed Care) | Attending: Emergency Medicine | Admitting: Emergency Medicine

## 2021-07-16 ENCOUNTER — Other Ambulatory Visit: Payer: Self-pay

## 2021-07-16 DIAGNOSIS — M545 Low back pain, unspecified: Secondary | ICD-10-CM | POA: Diagnosis present

## 2021-07-16 DIAGNOSIS — Z7951 Long term (current) use of inhaled steroids: Secondary | ICD-10-CM | POA: Insufficient documentation

## 2021-07-16 DIAGNOSIS — Z79899 Other long term (current) drug therapy: Secondary | ICD-10-CM | POA: Diagnosis not present

## 2021-07-16 DIAGNOSIS — I10 Essential (primary) hypertension: Secondary | ICD-10-CM | POA: Diagnosis not present

## 2021-07-16 DIAGNOSIS — J449 Chronic obstructive pulmonary disease, unspecified: Secondary | ICD-10-CM | POA: Insufficient documentation

## 2021-07-16 MED ORDER — HYDROCODONE-ACETAMINOPHEN 5-325 MG PO TABS: 1.0000 | ORAL_TABLET | Freq: Four times a day (QID) | ORAL | 0 refills | Status: DC | PRN

## 2021-07-16 NOTE — Discharge Instructions (Signed)
Go back on your meloxicam but no longer than 2 weeks.  You can take the Robaxin as well but do not mix it with the pain medication.  Continue ice and heat.  If you start having inability to control your bowel movements, weakness and inability to walk because 1 leg is weak and not working return to the emergency room. ?

## 2021-07-16 NOTE — ED Triage Notes (Signed)
Patient reports back pain that is unalleviated by the medication she was prescribed on 4/15. Patient reports she was given Robaxin at last visit. Patient reports pain does not radiate down either leg ?

## 2021-07-16 NOTE — ED Provider Notes (Signed)
?[facility] ?Provider Note ? ? ?CSN: [id number] ?Arrival date & time: 07/16/21  2297 ? ?  ? ?History ? ?Chief Complaint  ?Patient presents with  ? Back Pain  ? ? ?Monica Scott is a 66 y.o. female. ? ?Patient is a 66 year old female with a history of hypertension, COPD on chronic oxygen therapy, chronic urinary retention and self catheterizes and chronic back pain for years who presents with persistent low back pain.  She reports it is now been this way for about a week.  She reports 1-2 times a year her back will go out and this feels very similar to those situations.  She was seen in the emergency room 4 days ago for the same pain.  She reports the pain is unchanged and is still present.  It is not gotten worse or moved anywhere but the Robaxin is not helping.  She reports in starting the Robaxin she is also discontinued the meloxicam because that was her understanding of what she should do.  She also was found to have a UTI when she was last here but took the Keflex and reports no bladder discomfort, change in her urine smell or color.  She is not having any fever or vomiting.  She denies any pain radiating down into her legs or weakness.  She does not recall a specific event that caused her back to go out but she reports when she has to self cath it is very uncomfortable for her to bend at this time.  No fevers, upper back pain or shortness of breath.  No flank pain. ? ?The history is provided by the patient.  ?Back Pain ? ?  ? ?Home Medications ?Prior to Admission medications   ?Medication Sig Start Date End Date Taking? Authorizing Provider  ?HYDROcodone-acetaminophen (NORCO/VICODIN) 5-325 MG tablet Take 1 tablet by mouth every 6 (six) hours as needed for severe pain. 07/16/21  Yes Blanchie Dessert, MD  ?albuterol (PROVENTIL) (2.5 MG/3ML) 0.083% nebulizer solution Take 3 mLs (2.5 mg total) by nebulization every 6 (six) hours as needed for wheezing or shortness of breath. 10/08/19    Rigoberto Noel, MD  ?albuterol (VENTOLIN HFA) 108 (90 Base) MCG/ACT inhaler INHALE 1-2 PUFFS EVERY 6 HOURS AS NEEDED FOR WHEEZE OR SHORTNESS OF BREATH 03/05/21   Rigoberto Noel, MD  ?budesonide-formoterol [facility]) 80-4.5 MCG/ACT inhaler Inhale 2 puffs into the lungs in the morning and at bedtime. 11/19/20   Parrett, Fonnie Mu, NP  ?buPROPion (WELLBUTRIN XL) 150 MG 24 hr tablet Take 150 mg by mouth at bedtime. 03/02/20   [provider]  ?cephALEXin (KEFLEX) 500 MG capsule Take 1 capsule (500 mg total) by mouth 3 (three) times daily for 7 days. 07/11/21 07/18/21  Godfrey Pick, MD  ?FLUoxetine (PROZAC) 20 MG capsule Take 60 mg by mouth daily. 05/14/20   [provider]  ?gabapentin (NEURONTIN) 400 MG capsule Take 1 capsule (400 mg total) by mouth 3 (three) times daily. 12/06/17   Fulp, Cammie, MD  ?ibuprofen (ADVIL,MOTRIN) 200 MG tablet Take 400 mg by mouth 3 (three) times daily as needed for moderate pain.    [provider]  ?INCRUSE ELLIPTA 62.5 MCG/ACT AEPB INHALE 2 PUFFS INTO LUNGS ONCE DAILY AT 6 AM 06/26/21   Rigoberto Noel, MD  ?ipratropium-albuterol (DUONEB) 0.5-2.5 (3) MG/3ML SOLN USE 1 AMPULE IN NEBULIZER EVERY 6 HOURS AS NEEDED 11/21/20   Rigoberto Noel, MD  ?loperamide (IMODIUM A-D) 2 MG tablet Take 4 mg by  mouth 2 (two) times daily as needed for diarrhea or loose stools.    [provider]  ?methocarbamol (ROBAXIN) 500 MG tablet Take 1 tablet (500 mg total) by mouth 2 (two) times daily as needed for muscle spasms. 07/11/21   Godfrey Pick, MD  ?OLANZapine (ZYPREXA) 5 MG tablet Take 10 mg by mouth at bedtime.     [provider]  ?olmesartan (BENICAR) 40 MG tablet TAKE 1 TABLET BY MOUTH EVERY DAY 06/23/21   Gildardo Pounds, NP  ?omeprazole (PRILOSEC) 40 MG capsule TAKE 1 CAPSULE (40 MG TOTAL) BY MOUTH IN THE MORNING AND AT BEDTIME. 04/24/21 07/23/21  Charlott Rakes, MD  ?ondansetron (ZOFRAN ODT) 4 MG disintegrating tablet '4mg'$  ODT q4 hours prn nausea/vomit 11/09/20   Elnora Morrison, MD  ?traZODone (DESYREL) 100 MG tablet Take 100 mg by mouth at bedtime as needed. 06/27/20   [provider]  ?   ? ?Allergies    ?Tizanidine   ? ?Review of Systems   ?Review of Systems  ?Musculoskeletal:  Positive for back pain.  ? ?Physical Exam ?Updated Vital Signs ?BP 119/86 (BP Location: Right Arm)   Pulse 98   Temp 98.1 ?F (36.7 ?C) (Oral)   Resp 16   Ht '4\' 11"'$  (1.499 m)   Wt 71.7 kg   SpO2 94%   BMI 31.91 kg/m?  ?Physical Exam ?Vitals and nursing note reviewed.  ?Constitutional:   ?   General: She is not in acute distress. ?   Appearance: She is well-developed.  ?   Comments: Sitting in the room in a comfortable position with her legs crossed.  ?HENT:  ?   Head: Normocephalic and atraumatic.  ?Eyes:  ?   Pupils: Pupils are equal, round, and reactive to light.  ?Cardiovascular:  ?   Rate and Rhythm: Normal rate and regular rhythm.  ?   Heart sounds: Normal heart sounds. No murmur heard. ?  No friction rub.  ?Pulmonary:  ?   Effort: Pulmonary effort is normal.  ?   Breath sounds: Normal breath sounds. No wheezing or rales.  ?Abdominal:  ?   General: Bowel sounds are normal. There is no distension.  ?   Palpations: Abdomen is soft.  ?   Tenderness: There is no abdominal tenderness. There is no right CVA tenderness, left CVA tenderness, guarding or rebound.  ?Musculoskeletal:     ?   General: No tenderness. Normal range of motion.  ?   Comments: No edema.  Tenderness with palpation to the entire lumbar spine.  Mild paralumbar tenderness.  ?Skin: ?   General: Skin is warm and dry.  ?   Findings: No rash.  ?Neurological:  ?   Mental Status: She is alert and oriented to person, place, and time.  ?   Sensory: No sensory deficit.  ?   Motor: No weakness.  ?   Gait: Gait normal.  ?Psychiatric:     ?   Behavior: Behavior normal.  ? ? ?ED Results / Procedures / Treatments   ?Labs ?(all labs ordered are listed, but only abnormal results are displayed) ?Labs Reviewed - No data to  display ? ?EKG ?None ? ?Radiology ?No results found. ? ?Procedures ?Procedures  ? ? ?Medications Ordered in ED ?Medications - No data to display ? ?ED Course/ Medical Decision Making/ A&P ?  ?                        ?Medical Decision  Making ?Amount and/or Complexity of Data Reviewed ?External Data Reviewed: notes. ? ?Risk ?Prescription drug management. ? ? ?Pt with gradual onset of back pain suggestive of DDD exacerbation.  No neurovascular compromise and no recal incontinence.  Pt has no infectious sx, hx of CA  or other red flags concerning for pathologic back pain.  Pt is able to ambulate but is painful.  Normal strength and reflexes on exam.  Denies trauma.  Low concern at this time for epidural abscess, osteomyelitis, discitis.  Patient had a CT done 4 days ago which showed radiculitis and mild progression of arthritis but no other acute findings.  Feel that some of patient's ongoing pain may be the result of her discontinuing her meloxicam.  Encouraged her to go back on it for the next 1 to 2 weeks at the most as she has had a prior history of stomach ulcers.  She also was told she can use the muscle relaxer as needed and was given a short prescription for pain medication. ?Will give pt pain control and to return for developement of above sx. ? ? ? ? ? ? ? ? ?Final Clinical Impression(s) / ED Diagnoses ?Final diagnoses:  ?Acute bilateral low back pain without sciatica  ? ? ?Rx / DC Orders ?ED Discharge Orders   ? ?      Ordered  ?  HYDROcodone-acetaminophen (NORCO/VICODIN) 5-325 MG tablet  Every 6 hours PRN       ? 07/16/21 0751  ? ?  ?  ? ?  ? ? ?  ?Blanchie Dessert, MD ?07/16/21 [phone] ? ?

## 2021-07-30 ENCOUNTER — Other Ambulatory Visit: Payer: Self-pay | Admitting: Nurse Practitioner

## 2021-07-30 DIAGNOSIS — I10 Essential (primary) hypertension: Secondary | ICD-10-CM

## 2021-07-31 ENCOUNTER — Other Ambulatory Visit: Payer: Self-pay | Admitting: Pharmacist

## 2021-07-31 DIAGNOSIS — I10 Essential (primary) hypertension: Secondary | ICD-10-CM

## 2021-07-31 MED ORDER — AMLODIPINE BESYLATE 5 MG PO TABS: ORAL_TABLET | ORAL | 0 refills | Status: DC

## 2021-08-03 ENCOUNTER — Encounter: Payer: Self-pay | Admitting: Pulmonary Disease

## 2021-08-03 ENCOUNTER — Ambulatory Visit (INDEPENDENT_AMBULATORY_CARE_PROVIDER_SITE_OTHER): Payer: Medicare (Managed Care) | Admitting: Pulmonary Disease

## 2021-08-03 DIAGNOSIS — J432 Centrilobular emphysema: Secondary | ICD-10-CM | POA: Diagnosis not present

## 2021-08-03 DIAGNOSIS — J9611 Chronic respiratory failure with hypoxia: Secondary | ICD-10-CM

## 2021-08-03 MED ORDER — TRELEGY ELLIPTA 100-62.5-25 MCG/ACT IN AEPB: 1.0000 | INHALATION_SPRAY | Freq: Every day | RESPIRATORY_TRACT | 5 refills | Status: DC

## 2021-08-03 MED ORDER — TRELEGY ELLIPTA 100-62.5-25 MCG/ACT IN AEPB: 1.0000 | INHALATION_SPRAY | Freq: Every day | RESPIRATORY_TRACT | 0 refills | Status: DC

## 2021-08-03 NOTE — Progress Notes (Signed)
? ?  Subjective:  ? ? Patient ID: Monica Scott, female    DOB: 09/11/1955, 66 y.o.   MRN: [id number] ? ?HPI ? ?66 yo ex-smoker  followed for COPD and chronic hypoxic respiratory failure started on oxygen July 2019 ?PMH-  mild obstructive sleep apnea declined CPAP ?Neurogenic bladder with self-catheterization ? ? ?Chief Complaint  ?Patient presents with  ? Follow-up  ? ?I am seeing her after a year and a half.  Last office visit with APP was reviewed, she needed preop clearance for bladder surgery, surgery was canceled due to high surgical risk ?Remains on Oxygen 2l/m rest and 3l/m with activity.  ?No interim COPD exacerbations, she complains of low back pain. ?She remains on Symbicort and Incruse, this tastes nasty.  For some reason Trelegy was declined?  Higher dose prescribed.  Previously Judithann Sauger was not covered. ?She is more compliant with oxygen. ?She is due for a colonoscopy and has a pending GI appointment to discuss. ?I reviewed last low-dose CT scan in December 2021 ? ? ?Significant tests/ events reviewed ? ?PFTs 09/2019 -severe airway obstruction, ratio 51, FEV1 46%, no bronchodilator response, TLC 138% showing hyperinflation, DLCO 73% ? ?LDCT 02/2020 RADS 1, no suspicious nodules noted.,  Emphysema ?  ?CT chest 04/2018 shows changes of emphysema, mild ectasia of thoracic aorta 3.5 cm ?  ?2D echo February 13, 2019 showed a normal EF at 60 to 65%.  ?  ?N PSG was done on February 13, 2019 that showed mild obstructive sleep apnea with AHI at 7.6 and SPO2 low at 73%.  ?  ?  ?Alpha-1 antitrypsin-MM, 139 ? ? ?Review of Systems ?neg for any significant sore throat, dysphagia, itching, sneezing, nasal congestion or excess/ purulent secretions, fever, chills, sweats, unintended wt loss, pleuritic or exertional cp, hempoptysis, orthopnea pnd or change in chronic leg swelling. Also denies presyncope, palpitations, heartburn, abdominal pain, nausea, vomiting, diarrhea or change in bowel or urinary habits,  dysuria,hematuria, rash, arthralgias, visual complaints, headache, numbness weakness or ataxia. ? ?   ?Objective:  ? Physical Exam ? ?[place], obese, in no distress ?ENT - no lesions, no post nasal drip, on O2 ?Neck: No JVD, no thyromegaly, no carotid bruits ?Lungs: no use of accessory muscles, no dullness to percussion, decreased without rales or rhonchi  ?Cardiovascular: Rhythm regular, heart sounds  normal, no murmurs or gallops, no peripheral edema ?Musculoskeletal: No deformities, no cyanosis or clubbing , no tremors ? ? ? ?   ?Assessment & Plan:  ? ? ?

## 2021-08-03 NOTE — Patient Instructions (Signed)
? ?  X Sample & Rx for trelegy 100 once daily - rinse mouth after use ? ?Cleared for colonoscopy with due risk ?

## 2021-08-03 NOTE — Assessment & Plan Note (Signed)
She is more compliant with her oxygen and this has certainly helped decrease her hospitalizations.  Unfortunately co-pay was too high for center-based pulmonary rehab. ? ?We discussed risk of procedures such as colonoscopy and urological bladder surgery.  She would be at moderate risk for postop complications but this can be done based on risk/benefit discussion.  Clearly for bladder procedure, spinal anesthesia can be used which would be lower risk.  I would  defer this to the operator ?

## 2021-08-03 NOTE — Assessment & Plan Note (Addendum)
We will switch her to Trelegy from Symbicort and Incruse, sample of 100 and prescription will be provided. ?We discussed COPD action plan. ?She will use albuterol nebs as needed and call us should she get sick ?Screening low-dose CT chest can be scheduled for lung cancer ? ? ?

## 2021-08-03 NOTE — Addendum Note (Signed)
Addended by: Konrad Felix L on: 08/03/2021 10:37 AM ? ? Modules accepted: Orders ? ?

## 2021-08-04 ENCOUNTER — Other Ambulatory Visit: Payer: Self-pay | Admitting: Nurse Practitioner

## 2021-08-04 ENCOUNTER — Ambulatory Visit (INDEPENDENT_AMBULATORY_CARE_PROVIDER_SITE_OTHER): Payer: Self-pay | Admitting: *Deleted

## 2021-08-04 VITALS — Ht 59.0 in | Wt 163.0 lb

## 2021-08-04 DIAGNOSIS — Z86010 Personal history of colonic polyps: Secondary | ICD-10-CM

## 2021-08-04 DIAGNOSIS — Z1231 Encounter for screening mammogram for malignant neoplasm of breast: Secondary | ICD-10-CM

## 2021-08-04 NOTE — Progress Notes (Addendum)
Gastroenterology Pre-Procedure Review  Request Date: 08/04/2021 Requesting Physician: Last TCS done 02/23/2019 by Dr. [name], diverticulosis, tubular adenomas, hyperplastic polyps, 3 year recall, family hx of colon cancer (brother)  PATIENT REVIEW QUESTIONS: The patient responded to the following health history questions as indicated:    1. Diabetes Melitis: no 2. Joint replacements in the past 12 months: no 3. Major health problems in the past 3 months: no 4. Has an artificial valve or MVP: no 5. Has a defibrillator: no 6. Has been advised in past to take antibiotics in advance of a procedure like teeth cleaning: no 7. Family history of colon cancer: yes, brother: age 61  8. Alcohol Use: no 9. Illicit drug Use: no 10. History of sleep apnea: no  11. History of coronary artery or other vascular stents placed within the last 12 months: no 12. History of any prior anesthesia complications: no 13. Body mass index is 32.92 kg/m.    MEDICATIONS & ALLERGIES:    Patient reports the following regarding taking any blood thinners:   Plavix? no Aspirin? no Coumadin? no Brilinta? no Xarelto? no Eliquis? no Pradaxa? no Savaysa? no Effient? no  Patient confirms/reports the following medications:  Current Outpatient Medications  Medication Sig Dispense Refill   albuterol (PROVENTIL) (2.5 MG/3ML) 0.083% nebulizer solution Take 3 mLs (2.5 mg total) by nebulization every 6 (six) hours as needed for wheezing or shortness of breath. 360 mL 5   albuterol (VENTOLIN HFA) 108 (90 Base) MCG/ACT inhaler INHALE 1-2 PUFFS EVERY 6 HOURS AS NEEDED FOR WHEEZE OR SHORTNESS OF BREATH 8.5 each 1   amLODipine (NORVASC) 5 MG tablet TAKE 1 TABLET BY MOUTH TWICE DAILY TO  LOWER  BLOOD  PRESSURE 60 tablet 0   atorvastatin (LIPITOR) 20 MG tablet Take 20 mg by mouth daily.     buPROPion (WELLBUTRIN XL) 150 MG 24 hr tablet Take 150 mg by mouth at bedtime.     Fluticasone-Umeclidin-Vilant (TRELEGY ELLIPTA) 100-62.5-25  MCG/ACT AEPB Inhale 1 puff into the lungs daily. 28 each 5   gabapentin (NEURONTIN) 400 MG capsule Take 1 capsule (400 mg total) by mouth 3 (three) times daily. 90 capsule 11   ibuprofen (ADVIL,MOTRIN) 200 MG tablet Take 400 mg by mouth as needed for moderate pain.     ipratropium-albuterol (DUONEB) 0.5-2.5 (3) MG/3ML SOLN USE 1 AMPULE IN NEBULIZER EVERY 6 HOURS AS NEEDED 360 mL 2   methocarbamol (ROBAXIN) 500 MG tablet Take 1 tablet (500 mg total) by mouth 2 (two) times daily as needed for muscle spasms. 20 tablet 0   OLANZapine (ZYPREXA) 5 MG tablet Take 10 mg by mouth at bedtime.      olmesartan (BENICAR) 40 MG tablet TAKE 1 TABLET BY MOUTH EVERY DAY 90 tablet 0   omeprazole (PRILOSEC) 40 MG capsule TAKE 1 CAPSULE (40 MG TOTAL) BY MOUTH IN THE MORNING AND AT BEDTIME. 180 capsule 0   No current facility-administered medications for this visit.    Patient confirms/reports the following allergies:  Allergies  Allergen Reactions   Tizanidine Other (See Comments)    Caused dizziness and dry mouth. Patient prefers not to take this again.    No orders of the defined types were placed in this encounter.   AUTHORIZATION INFORMATION Primary Insurance: [place],  [place] #: [id number] Pre-Cert / Auth required: No, not required per Kathyrn Drown Pre-Cert / Auth #: REF#: [id number]  Secondary Insurance: Medicaid [place] [employer],  ID #: [id number] N Pre-Cert / Josem Kaufmann required: No, not required  SCHEDULE INFORMATION:  Procedure has been scheduled as follows:  Date: 09/21/2021, Time:  2:15 Location: [facility] with Dr. [name]  This Gastroenterology Pre-Precedure Review Form is being routed to the following provider(s): Roseanne Kaufman, NP

## 2021-08-10 ENCOUNTER — Other Ambulatory Visit (HOSPITAL_COMMUNITY)
Admission: RE | Admit: 2021-08-10 | Discharge: 2021-08-10 | Disposition: A | Discharge status: Home / Self Care | Payer: Medicare (Managed Care) | Attending: Obstetrics and Gynecology | Admitting: Obstetrics and Gynecology

## 2021-08-10 ENCOUNTER — Ambulatory Visit (INDEPENDENT_AMBULATORY_CARE_PROVIDER_SITE_OTHER): Payer: Medicare (Managed Care)

## 2021-08-10 DIAGNOSIS — B9620 Unspecified Escherichia coli [E. coli] as the cause of diseases classified elsewhere: Secondary | ICD-10-CM | POA: Diagnosis not present

## 2021-08-10 DIAGNOSIS — R350 Frequency of micturition: Secondary | ICD-10-CM

## 2021-08-10 DIAGNOSIS — N390 Urinary tract infection, site not specified: Secondary | ICD-10-CM | POA: Insufficient documentation

## 2021-08-10 DIAGNOSIS — N3000 Acute cystitis without hematuria: Secondary | ICD-10-CM

## 2021-08-10 LAB — POCT URINALYSIS DIPSTICK
Appearance: NORMAL
Bilirubin, UA: NEGATIVE
Blood, UA: NEGATIVE
Glucose, UA: NEGATIVE
Ketones, UA: NEGATIVE
Leukocytes, UA: NEGATIVE
Nitrite, UA: NEGATIVE
Protein, UA: NEGATIVE
Spec Grav, UA: 1.01 (ref 1.010–1.025)
Urobilinogen, UA: 0.2 E.U./dL
pH, UA: 6 (ref 5.0–8.0)

## 2021-08-10 NOTE — Patient Instructions (Signed)
Your Urine dip that was done in office was Negative. I am sending the urine off for culture and you can take AZO over the counter for your discomfort.  We will contact you when the results are back between 3-5 days. If a different antibiotic is needed we will sent the order to the pharmacy and you will be notified. If you have any questions or concerns please feel free to call us at [phone]   

## 2021-08-10 NOTE — Progress Notes (Signed)
Monica Scott is a 66 y.o. female arrived today with UTI sx.  ?A urine specimen was collected and POCT Urine was done. ?POCT Urine was Negative ? ?

## 2021-08-12 ENCOUNTER — Encounter (HOSPITAL_BASED_OUTPATIENT_CLINIC_OR_DEPARTMENT_OTHER): Payer: Self-pay | Admitting: Physical Therapy

## 2021-08-12 ENCOUNTER — Ambulatory Visit (HOSPITAL_BASED_OUTPATIENT_CLINIC_OR_DEPARTMENT_OTHER): Payer: Medicare (Managed Care) | Attending: Family Medicine | Admitting: Physical Therapy

## 2021-08-12 DIAGNOSIS — M5136 Other intervertebral disc degeneration, lumbar region: Secondary | ICD-10-CM | POA: Insufficient documentation

## 2021-08-12 DIAGNOSIS — M6281 Muscle weakness (generalized): Secondary | ICD-10-CM | POA: Diagnosis not present

## 2021-08-12 DIAGNOSIS — M5459 Other low back pain: Secondary | ICD-10-CM | POA: Insufficient documentation

## 2021-08-12 DIAGNOSIS — M533 Sacrococcygeal disorders, not elsewhere classified: Secondary | ICD-10-CM | POA: Insufficient documentation

## 2021-08-12 LAB — URINE CULTURE: Culture: 60000 — AB

## 2021-08-12 MED ORDER — SULFAMETHOXAZOLE-TRIMETHOPRIM 800-160 MG PO TABS: 1.0000 | ORAL_TABLET | Freq: Two times a day (BID) | ORAL | 0 refills | Status: AC

## 2021-08-12 NOTE — Addendum Note (Signed)
Addended by: Jaquita Folds on: 08/12/2021 12:15 PM ? ? Modules accepted: Orders ? ?

## 2021-08-12 NOTE — Progress Notes (Signed)
Monica Scott is a 66 y.o. female was contacted and notified of the message below. Pt verbalized understanding and had no additional questions. ?

## 2021-08-12 NOTE — Therapy (Signed)
?OUTPATIENT PHYSICAL THERAPY THORACOLUMBAR EVALUATION ? ? ?Patient Name: Monica Scott ?MRN: [id number] ?DOB:1955-06-13, 66 y.o., female ?Today's Date: 08/12/2021 ? ? PT End of Session - 08/12/21 1152   ? ? Visit Number 1   ? Number of Visits 19   ? Date for PT Re-Evaluation 11/10/21   ? Authorization Type [employer]   ? PT Start Time 1030   arrives late  ? PT Stop Time 1100   ? PT Time Calculation (min) 30 min   ? Activity Tolerance Patient tolerated treatment well   ? Behavior During Therapy [facility] for tasks assessed/performed   ? ?  ?  ? ?  ? ? ?Past Medical History:  ?Diagnosis Date  ? Arthritis   ? Asthma   ? Bladder disease   ? COPD (chronic obstructive pulmonary disease) ([place])   ? Depression   ? GERD (gastroesophageal reflux disease)   ? Hyperlipidemia   ? Hypertension   ? Peptic ulcer   ? Urinary retention   ? ?Past Surgical History:  ?Procedure Laterality Date  ? BIOPSY  04/24/2018  ? Procedure: BIOPSY;  Surgeon: Daneil Dolin, MD;  [facility];  Service: Endoscopy;;  esophagus  ? COLONOSCOPY WITH PROPOFOL N/A 04/24/2018  ? Procedure: COLONOSCOPY WITH PROPOFOL;  Surgeon: Daneil Dolin, MD;  [facility];  Service: Endoscopy;  Laterality: N/A;  9:00am  ? ESOPHAGOGASTRODUODENOSCOPY (EGD) WITH PROPOFOL N/A 04/24/2018  ? Procedure: ESOPHAGOGASTRODUODENOSCOPY (EGD) WITH PROPOFOL;  Surgeon: Daneil Dolin, MD;  [facility];  Service: Endoscopy;  Laterality: N/A;  ? MALONEY DILATION N/A 04/24/2018  ? Procedure: MALONEY DILATION;  Surgeon: Daneil Dolin, MD;  [facility];  Service: Endoscopy;  Laterality: N/A;  ? PILONIDAL CYST EXCISION    ? age 73  ? POLYPECTOMY  04/24/2018  ? Procedure: POLYPECTOMY;  Surgeon: Daneil Dolin, MD;  [facility];  Service: Endoscopy;;  colon ?  ? VAGINAL HYSTERECTOMY    ? ?Patient Active Problem List  ? Diagnosis Date Noted  ? Preop pulmonary/respiratory exam 03/09/2021  ? Physical deconditioning 04/07/2020  ?  Dyspnea 02/05/2019  ? Chronic respiratory failure with hypoxia ([place]) 02/05/2019  ? Daytime sleepiness 02/05/2019  ? Pelvic relaxation due to cystocele, midline 01/16/2019  ? Emphysema of lung ([place]) 06/14/2018  ? Aortic atherosclerosis ([place]) 06/14/2018  ? Atherosclerosis of coronary artery 06/14/2018  ? 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 Esophageal dysphagia 03/01/2018  ? Family history of colon cancer 03/01/2018  ? Diarrhea 03/01/2018  ? Near syncope 10/17/2017  ? Hyperlipidemia 10/17/2017  ? GERD (gastroesophageal reflux disease) 10/17/2017  ? Bladder disease or syndrome 10/17/2017  ? Depression 10/17/2017  ? Hypertension, essential, benign 02/19/2017  ? COPD (chronic obstructive pulmonary disease) ([place]) 02/11/2017  ? Chest pain 02/12/2015  ? Referral of patient 02/12/2015  ? ? ?PCP:  ? Gildardo Pounds, NP   ? ? ?REFERRING PROVIDER: Gentry Fitz, MD  ? ?REFERRING DIAG: Low back pain with lumbar ddd; SI joint pain  ? ?THERAPY DIAG:  ?Other low back pain ? ?Muscle weakness (generalized) ? ?ONSET DATE: 2022 ? ?SUBJECTIVE:                                                                                                                                                                                          ? ?  SUBJECTIVE STATEMENT: ?Pt states the back pain has started getting worse in the last year. She states it has been going on for some time. She has history of urinary disorders that may be causing the pain. She has had labs that are all normal. They are sending off for it to get cultured as well. ? ?Pt states the pain stays in the back. Pt states she lays in bed watching TV most of the day. She does very little walking/exercise/movement. Pt states she has not walked recently due to "not feeling well." Pt denies NT down the legs. Pt states that it is sharp pain with any movement that stays within the middle of her back. Pt denies red flags. ? ?Pt states she is on 3L O2 at baseline and regularly checks  her O2 saturation. She usually sits around 93-94%. She does have SOB with walking around the apt. Her diffuser is currently being repaired and will return in the next few weeks. She presents with tank today.  ? ?PERTINENT HISTORY:  ? ?PMH: COPD, HTN, HLD, GERD, depression, urinary retention, and arthritis  ? ?PAIN:  ?Are you having pain? Yes: NPRS scale: 6/10 ?Pain location: L lower back ?Pain description: sharp ?Aggravating factors: standing, lifting, walking, bending, twisting  ?Relieving factors: laying , leaning ? ? ?PRECAUTIONS: Other: Needs O2 ? ?WEIGHT BEARING RESTRICTIONS No ? ?FALLS:  ?Has patient fallen in last 6 months? No ? ?LIVING ENVIRONMENT: ?Lives with: lives alone ?Lives in: House/apartment ?Stairs: No, 1st level apts ?Has following equipment at home: None ? ?Requires O2 at baseline- 3L- usually sits a 93-94% ? ?OCCUPATION: N/A ? ?PLOF: Independent with basic ADLs ? ?PATIENT GOALS:  Pt states she would like to get back to walking for exercise and reduce the pain.  ? ? ?OBJECTIVE:  ? ?DIAGNOSTIC FINDINGS:  ?IMPRESSION: ?1. No acute osseous abnormality in the lumbar spine. Moderate ?chronic scoliosis. Advanced chronic disc and endplate degeneration ?at L2-L3. ?2. Possible progression of right lateral recess and mild spinal ?stenosis at L4-L5 since an MRI last year. Query right L5 ?radiculitis. Evidence of a small cephalad disc extrusion at the ?L2-L3 left lateral recess. Query left L3 radiculitis. Stable ?moderate left L2 and right L5 neural foraminal stenosis. ?3. Aortic Atherosclerosis (ICD10-I70.0). ? ?PATIENT SURVEYS:  ?ODI Score = 30 points (60%)  ? ?SCREENING FOR RED FLAGS: ?Bowel or bladder incontinence: Yes:   ?Spinal tumors: No ?Cauda equina syndrome: No ?Compression fracture: No ? ? ?COGNITION: ? Overall cognitive status: Within functional limits for tasks assessed   ?  ?SENSATION: ?WFL ? ? ?POSTURE:  ?Decreased lumbar lordosis ? ?PALPATION: ?TTP bilateral lumbar region and into superior  glutes ?Bilat L/S joint extension stiffness with UPA ? ?LUMBAR ROM:  ? ?Active  A/PROM  ?08/12/2021  ?Flexion 25% p!  ?Extension 50% p!  ?Right lateral flexion 50% p! On L  ?Left lateral flexion 50% p!  ?Right rotation 75% p!  ?Left rotation 75%   ? (Blank rows = not tested) ? ?LE ROM: WFL for tasks assessed, limited by moderate hip stiffness ? ?LE MMT: no pain ? ?MMT Right ?08/12/2021 Left ?08/12/2021  ?Hip flexion 4+/5 4+/5  ?Hip extension 4-/5 4-/5  ?Hip abduction 4+/5 4+/5  ?Hip adduction 4+/5 4+/5  ?Ankle dorsiflexion 4+/5 4+/5  ?Ankle plantarflexion 4+/5 4+/5  ?Ankle inversion 4+/5 4+/5  ?Ankle eversion 4+/5 4+/5  ? (Blank rows = not tested) ? ?LUMBAR SPECIAL TESTS:  ?Slump test: Positive, Stork standing: Negative, and FABER test: Positive ? ?L3 reflex  2+ bilaterally ? ?FUNCTIONAL TESTS:  ?5 times sit to stand: requires UE to standing, painful ?4 Stage Balance: <5s during Tandem balance testing ? ?GAIT: ?Distance walked: 46f ?Assistive device utilized: None ?Level of assistance: Complete Independence ?Comments: carrying O2 tank, compensated Trendelenburg ? ?Stairs: hip rotation compensation with descend, requires UE assist, reciprocal pattern with decreased eccentric lowering strength ? ? ? ?TODAY'S TREATMENT  ? ? ?Exercises ?- Supine Bridge  - 2 x daily - 7 x weekly - 2 sets - 10 reps ?- Seated Pelvic Tilt  - 2 x daily - 7 x weekly - 1 sets - 10 reps ?- Seated Flexion Stretch  - 2 x daily - 7 x weekly - 1 sets - 10 reps - 3-5 hold ? ?Patient Education ?- walking program  ? ? ?PATIENT EDUCATION:  ?Education details: MOI, diagnosis, prognosis, anatomy, exercise progression, DOMS expectations, muscle firing,  envelope of function, HEP, POC ? ?Person educated: Patient ?Education method: Explanation, Demonstration, Tactile cues, Verbal cues, and Handouts ?Education comprehension: verbalized understanding, returned demonstration, verbal cues required, and tactile cues required ? ? ?HOME EXERCISE PROGRAM: ?Access  Code: [email or web address] ?URL: [email or web address] ?Date: 08/12/2021 ?Prepared by: ADaleen Bo? ?ASSESSMENT: ? ?CLINICAL IMPRESSION: ?Patient is a 66y.o. female who was seen today for physical therapy eva

## 2021-08-13 ENCOUNTER — Ambulatory Visit
Admission: RE | Admit: 2021-08-13 | Discharge: 2021-08-13 | Disposition: A | Discharge status: Home / Self Care | Payer: Medicare (Managed Care) | Source: Ambulatory Visit | Attending: Nurse Practitioner | Admitting: Nurse Practitioner

## 2021-08-13 DIAGNOSIS — Z1231 Encounter for screening mammogram for malignant neoplasm of breast: Secondary | ICD-10-CM

## 2021-08-20 NOTE — Progress Notes (Signed)
ASA 2. Appropriate.  ?

## 2021-08-21 ENCOUNTER — Other Ambulatory Visit: Payer: Self-pay | Admitting: *Deleted

## 2021-08-21 ENCOUNTER — Encounter: Payer: Self-pay | Admitting: *Deleted

## 2021-08-21 MED ORDER — NA SULFATE-K SULFATE-MG SULF 17.5-3.13-1.6 GM/177ML PO SOLN: 1.0000 | Freq: Once | ORAL | 0 refills | Status: AC

## 2021-08-21 NOTE — Addendum Note (Signed)
Addended by: Metro Kung on: 08/21/2021 09:51 AM   Modules accepted: Orders

## 2021-08-21 NOTE — Progress Notes (Signed)
Spoke to pt.  Scheduled procedure for 09/21/2021 at 2:15, arrival 12:45 at [facility].  Reviewed prep instructions with pt by phone.  Pt aware that I sent RX to her pharmacy.  Confirmed mailing address and mailed instructions.

## 2021-08-26 ENCOUNTER — Encounter (HOSPITAL_BASED_OUTPATIENT_CLINIC_OR_DEPARTMENT_OTHER): Payer: Self-pay | Admitting: Physical Therapy

## 2021-08-26 ENCOUNTER — Ambulatory Visit (HOSPITAL_BASED_OUTPATIENT_CLINIC_OR_DEPARTMENT_OTHER): Payer: Medicare (Managed Care) | Admitting: Physical Therapy

## 2021-08-26 DIAGNOSIS — M5459 Other low back pain: Secondary | ICD-10-CM

## 2021-08-26 DIAGNOSIS — M545 Low back pain, unspecified: Secondary | ICD-10-CM

## 2021-08-26 DIAGNOSIS — M6281 Muscle weakness (generalized): Secondary | ICD-10-CM

## 2021-08-26 NOTE — Therapy (Signed)
OUTPATIENT PHYSICAL THERAPY TREATMENT NOTE   Patient Name: Monica Scott MRN: [id number] DOB:02-Sep-1955, 66 y.o., female Today's Date: 08/26/2021     PT End of Session - 08/26/21 1335     Visit Number 2    Number of Visits 19    Date for PT Re-Evaluation 11/10/21    Authorization Type [employer]    PT Start Time 8916    PT Stop Time 1411    PT Time Calculation (min) 38 min    Activity Tolerance Patient tolerated treatment well    Behavior During Therapy WFL for tasks assessed/performed             Past Medical History:  Diagnosis Date   Arthritis    Asthma    Bladder disease    COPD (chronic obstructive pulmonary disease) ([place])    Depression    GERD (gastroesophageal reflux disease)    Hyperlipidemia    Hypertension    Peptic ulcer    Urinary retention    Past Surgical History:  Procedure Laterality Date   BIOPSY  04/24/2018   Procedure: BIOPSY;  Surgeon: Daneil Dolin, MD;  [facility];  Service: Endoscopy;;  esophagus   COLONOSCOPY WITH PROPOFOL N/A 04/24/2018   Procedure: COLONOSCOPY WITH PROPOFOL;  Surgeon: Daneil Dolin, MD;  [facility];  Service: Endoscopy;  Laterality: N/A;  9:00am   ESOPHAGOGASTRODUODENOSCOPY (EGD) WITH PROPOFOL N/A 04/24/2018   Procedure: ESOPHAGOGASTRODUODENOSCOPY (EGD) WITH PROPOFOL;  Surgeon: Daneil Dolin, MD;  [facility];  Service: Endoscopy;  Laterality: N/A;   MALONEY DILATION N/A 04/24/2018   Procedure: Venia Minks DILATION;  Surgeon: Daneil Dolin, MD;  [facility];  Service: Endoscopy;  Laterality: N/A;   PILONIDAL CYST EXCISION     age 60   POLYPECTOMY  04/24/2018   Procedure: POLYPECTOMY;  Surgeon: Daneil Dolin, MD;  [facility];  Service: Endoscopy;;  colon    VAGINAL HYSTERECTOMY     Patient Active Problem List   Diagnosis Date Noted   Preop pulmonary/respiratory exam 03/09/2021   Physical deconditioning 04/07/2020   Dyspnea 02/05/2019    Chronic respiratory failure with hypoxia ([place]) 02/05/2019   Daytime sleepiness 02/05/2019   Pelvic relaxation due to cystocele, midline 01/16/2019   Emphysema of lung ([place]) 06/14/2018   Aortic atherosclerosis ([place]) 06/14/2018   Atherosclerosis of coronary artery 06/14/2018   Thoracic aortic ectasia ([place]) 05/19/2018   Esophageal dysphagia 03/01/2018   Family history of colon cancer 03/01/2018   Diarrhea 03/01/2018   Near syncope 10/17/2017   Hyperlipidemia 10/17/2017   GERD (gastroesophageal reflux disease) 10/17/2017   Bladder disease or syndrome 10/17/2017   Depression 10/17/2017   Hypertension, essential, benign 02/19/2017   COPD (chronic obstructive pulmonary disease) ([place]) 02/11/2017   Chest pain 02/12/2015   Referral of patient 02/12/2015     PCP:    Gildardo Pounds, NP       REFERRING PROVIDER: Gentry Fitz, MD    REFERRING DIAG: Low back pain with lumbar ddd; SI joint pain    THERAPY DIAG:  Other low back pain   Muscle weakness (generalized)   ONSET DATE: 2022   SUBJECTIVE:  SUBJECTIVE STATEMENT: Pt states she has been on Prednisone for last 5 days (for LBP).  She arrives with O2 tank, but not using it to conserve resource.  "I've been up moving a lot" due to feeling better with prednisone.     PERTINENT HISTORY:    PMH: COPD, HTN, HLD, GERD, depression, urinary retention, and arthritis    PAIN:  Are you having pain? No: NPRS scale: 0/10 Pain location: L lower back Pain description:  Aggravating factors: standing, lifting, walking, bending, twisting  Relieving factors: laying , leaning     PRECAUTIONS: Other: Needs O2   WEIGHT BEARING RESTRICTIONS No   FALLS:  Has patient fallen in last 6 months? No   LIVING ENVIRONMENT: Lives with: lives alone Lives in:  House/apartment Stairs: No, 1st level apts Has following equipment at home: None   Requires O2 at baseline- 3L- usually sits a 93-94%   OCCUPATION: N/A   PLOF: Independent with basic ADLs   PATIENT GOALS:  Pt states she would like to get back to walking for exercise and reduce the pain.      OBJECTIVE:    DIAGNOSTIC FINDINGS:  IMPRESSION: 1. No acute osseous abnormality in the lumbar spine. Moderate chronic scoliosis. Advanced chronic disc and endplate degeneration at L2-L3. 2. Possible progression of right lateral recess and mild spinal stenosis at L4-L5 since an MRI last year. Query right L5 radiculitis. Evidence of a small cephalad disc extrusion at the L2-L3 left lateral recess. Query left L3 radiculitis. Stable moderate left L2 and right L5 neural foraminal stenosis. 3. Aortic Atherosclerosis (ICD10-I70.0).   PATIENT SURVEYS:  ODI Score = 30 points (60%)    SCREENING FOR RED FLAGS: Bowel or bladder incontinence: Yes:   Spinal tumors: No Cauda equina syndrome: No Compression fracture: No     COGNITION:           Overall cognitive status: Within functional limits for tasks assessed                          SENSATION: WFL     POSTURE:  Decreased lumbar lordosis   PALPATION: TTP bilateral lumbar region and into superior glutes Bilat L/S joint extension stiffness with UPA   LUMBAR ROM:    Active  A/PROM  08/12/2021  Flexion 25% p!  Extension 50% p!  Right lateral flexion 50% p! On L  Left lateral flexion 50% p!  Right rotation 75% p!  Left rotation 75%    (Blank rows = not tested)   LE ROM: WFL for tasks assessed, limited by moderate hip stiffness   LE MMT: no pain   MMT Right 08/12/2021 Left 08/12/2021  Hip flexion 4+/5 4+/5  Hip extension 4-/5 4-/5  Hip abduction 4+/5 4+/5  Hip adduction 4+/5 4+/5  Ankle dorsiflexion 4+/5 4+/5  Ankle plantarflexion 4+/5 4+/5  Ankle inversion 4+/5 4+/5  Ankle eversion 4+/5 4+/5   (Blank rows = not tested)    LUMBAR SPECIAL TESTS:  Slump test: Positive, Stork standing: Negative, and FABER test: Positive   L3 reflex 2+ bilaterally   FUNCTIONAL TESTS:  08/26/21 :  5 times sit to stand: 17.32 (standard chair, no UE) 4 Stage Balance: <5s during Tandem balance testing   GAIT: Distance walked: 21f Assistive device utilized: None Level of assistance: Complete Independence Comments: carrying O2 tank, compensated Trendelenburg   Stairs: hip rotation compensation with descend, requires UE assist, reciprocal pattern with decreased eccentric lowering strength   VITALS:  During session - spO2 93-94% room air, HR 93-119 bpm     TODAY'S TREATMENT  Therapeutic exercises:  -NuStep L2: legs only x 5 min -seated side stretch x 10s x 2 each - Seated Pelvic Tilt  - 2 x daily - 7 x weekly - 1 sets - 10 reps - Seated Flexion Stretch  - 2 x daily - 7 x weekly - 1 sets - 10 reps - 3-5 hold - Supine Bridge  -10 reps - LTR with arms in T x 3, LTR with wide feet for hip stretch  x 3 - hooklying piriformis stretch x 10s x 2 each LE -SKTC x 10s x 2 eachLE - hooklying hamstring stretch x 15s x 2 reps each LE - 5x STS (no arms), then STS with eccentric lowering and ab bracing x 3      PATIENT EDUCATION:  Education details: MOI, diagnosis, prognosis, anatomy, exercise progression, DOMS expectations, muscle firing,  envelope of function, HEP, POC   Person educated: Patient Education method: Explanation, Demonstration, Tactile cues, Verbal cues, and Handouts Education comprehension: verbalized understanding, returned demonstration, verbal cues required, and tactile cues required     HOME EXERCISE PROGRAM: Access Code: [email or web address] URL: [email or web address] Date: 08/12/2021 Prepared by: Daleen Bo   ASSESSMENT:   CLINICAL IMPRESSION: Pt arrived painfree likely from week of Prednisone.  She reported "tenderness" with lower trunk rotation exercise, but otherwise tolerated exercises well.  Pt  reported no pain with STS with abs braced.  Encouraged her to practice this with transitions in bed, to/from toilet, etc.  She plans to monitor length of time she walks (likely at [facility] where cart can support her O2 tank).  Pt would benefit from continued skilled therapy in order to reach goals and maximize functional lumbopelvic strength and mobility for prevention of further functional decline.     OBJECTIVE IMPAIRMENTS Abnormal gait, cardiopulmonary status limiting activity, decreased activity tolerance, decreased balance, decreased endurance, decreased knowledge of condition, decreased knowledge of use of DME, decreased mobility, difficulty walking, decreased ROM, decreased strength, hypomobility, increased muscle spasms, impaired flexibility, impaired vision/preception, improper body mechanics, postural dysfunction, obesity, and pain.    ACTIVITY LIMITATIONS cleaning, community activity, driving, meal prep, laundry, yard work, shopping, and exercise .    PERSONAL FACTORS Age, Behavior pattern, Fitness, Past/current experiences, Time since onset of injury/illness/exacerbation, and 3+ comorbidities:    are also affecting patient's functional outcome.      REHAB POTENTIAL: Fair     CLINICAL DECISION MAKING: Evolving/moderate complexity   EVALUATION COMPLEXITY: Moderate     GOALS:     SHORT TERM GOALS: Target date: 09/23/2021     Pt will become independent with HEP in order to demonstrate synthesis of PT education. Goal status: INITIAL   2.  Pt will report at least 2 pt reduction on NPRS scale for pain at baseline in order to demonstrate functional improvement with household activity, self care, and ADL.    Goal status: INITIAL   3.  Pt will be able to demonstrate/report ability to sit/stand/sleep for extended periods of time without pain in order to demonstrate functional improvement and tolerance to static positioning.    Goal status: INITIAL     LONG TERM GOALS: Target date:  11/04/2021    Pt  will become independent with final HEP in order to demonstrate synthesis of PT education.   Goal status: INITIAL   2.  Pt will demonstrate at least a 12.8 improvement in Oswestry Index in order to demonstrate a  clinically significant change in LBP and function.    Goal status: INITIAL   3.  Pt will be able to demonstrate/report ability to walk >15 mins in order to demonstrate functional improvement and tolerance to exercise and community mobility.    Goal status: INITIAL   4.  Pt will be able to demonstrate  in order to demonstrate functional improvement in UE/LE function for self-care and house hold duties.    Goal status: INITIAL       PLAN: PT FREQUENCY: 1-2x/week   PT DURATION: 12 weeks (likely D/C by 8 wks)   PLANNED INTERVENTIONS: Therapeutic exercises, Therapeutic activity, Neuromuscular re-education, Balance training, Gait training, Patient/Family education, Joint manipulation, Joint mobilization, Stair training, DME instructions, Aquatic Therapy, Dry Needling, Electrical stimulation, Spinal manipulation, Spinal mobilization, Cryotherapy, Moist heat, scar mobilization, Splintting, Taping, Vasopneumatic device, Traction, Ultrasound, Ionotophoresis '4mg'$ /ml Dexamethasone, Manual therapy, and Re-evaluation.   PLAN FOR NEXT SESSION: review HEP, STM/joint mobs, lumbar stretching, standing lumbar flexion/ext - add seated side bend and SKTC to HEP if tolerated next visit.     Kerin Perna, PTA 08/26/21 2:16 PM

## 2021-08-31 ENCOUNTER — Encounter (HOSPITAL_BASED_OUTPATIENT_CLINIC_OR_DEPARTMENT_OTHER): Payer: Self-pay | Admitting: Physical Therapy

## 2021-08-31 ENCOUNTER — Ambulatory Visit (HOSPITAL_BASED_OUTPATIENT_CLINIC_OR_DEPARTMENT_OTHER): Payer: Medicare (Managed Care) | Attending: Family Medicine | Admitting: Physical Therapy

## 2021-08-31 DIAGNOSIS — M5459 Other low back pain: Secondary | ICD-10-CM | POA: Diagnosis present

## 2021-08-31 DIAGNOSIS — M6281 Muscle weakness (generalized): Secondary | ICD-10-CM | POA: Insufficient documentation

## 2021-08-31 DIAGNOSIS — G8929 Other chronic pain: Secondary | ICD-10-CM | POA: Diagnosis present

## 2021-08-31 DIAGNOSIS — M25552 Pain in left hip: Secondary | ICD-10-CM | POA: Diagnosis present

## 2021-08-31 DIAGNOSIS — M545 Low back pain, unspecified: Secondary | ICD-10-CM | POA: Insufficient documentation

## 2021-08-31 NOTE — Therapy (Signed)
OUTPATIENT PHYSICAL THERAPY TREATMENT NOTE   Patient Name: Monica Scott MRN: [id number] DOB:[birth year], 66 y.o., female Today's Date: 08/31/2021     PT End of Session - 08/31/21 1341     Visit Number 3    Number of Visits 19    Date for PT Re-Evaluation 11/10/21    Authorization Type [employer]    PT Start Time 9211    PT Stop Time 1425    PT Time Calculation (min) 40 min    Activity Tolerance Patient tolerated treatment well    Behavior During Therapy WFL for tasks assessed/performed             Past Medical History:  Diagnosis Date   Arthritis    Asthma    Bladder disease    COPD (chronic obstructive pulmonary disease) ([place])    Depression    GERD (gastroesophageal reflux disease)    Hyperlipidemia    Hypertension    Peptic ulcer    Urinary retention    Past Surgical History:  Procedure Laterality Date   BIOPSY  04/24/2018   Procedure: BIOPSY;  Surgeon: Daneil Dolin, MD;  [facility];  Service: Endoscopy;;  esophagus   COLONOSCOPY WITH PROPOFOL N/A 04/24/2018   Procedure: COLONOSCOPY WITH PROPOFOL;  Surgeon: Daneil Dolin, MD;  [facility];  Service: Endoscopy;  Laterality: N/A;  9:00am   ESOPHAGOGASTRODUODENOSCOPY (EGD) WITH PROPOFOL N/A 04/24/2018   Procedure: ESOPHAGOGASTRODUODENOSCOPY (EGD) WITH PROPOFOL;  Surgeon: Daneil Dolin, MD;  [facility];  Service: Endoscopy;  Laterality: N/A;   MALONEY DILATION N/A 04/24/2018   Procedure: Venia Minks DILATION;  Surgeon: Daneil Dolin, MD;  [facility];  Service: Endoscopy;  Laterality: N/A;   PILONIDAL CYST EXCISION     age 71   POLYPECTOMY  04/24/2018   Procedure: POLYPECTOMY;  Surgeon: Daneil Dolin, MD;  [facility];  Service: Endoscopy;;  colon    VAGINAL HYSTERECTOMY     Patient Active Problem List   Diagnosis Date Noted   Preop pulmonary/respiratory exam 03/09/2021   Physical deconditioning 04/07/2020   Dyspnea 02/05/2019    Chronic respiratory failure with hypoxia ([place]) 02/05/2019   Daytime sleepiness 02/05/2019   Pelvic relaxation due to cystocele, midline 01/16/2019   Emphysema of lung ([place]) 06/14/2018   Aortic atherosclerosis ([place]) 06/14/2018   Atherosclerosis of coronary artery 06/14/2018   Thoracic aortic ectasia ([place]) 05/19/2018   Esophageal dysphagia 03/01/2018   Family history of colon cancer 03/01/2018   Diarrhea 03/01/2018   Near syncope 10/17/2017   Hyperlipidemia 10/17/2017   GERD (gastroesophageal reflux disease) 10/17/2017   Bladder disease or syndrome 10/17/2017   Depression 10/17/2017   Hypertension, essential, benign 02/19/2017   COPD (chronic obstructive pulmonary disease) ([place]) 02/11/2017   Chest pain 02/12/2015   Referral of patient 02/12/2015     PCP:    Gildardo Pounds, NP       REFERRING PROVIDER: Gentry Fitz, MD    REFERRING DIAG: Low back pain with lumbar ddd; SI joint pain    THERAPY DIAG:  Other low back pain   Muscle weakness (generalized)   ONSET DATE: 2022   SUBJECTIVE:  SUBJECTIVE STATEMENT: Pt states her back is moving well today but there is slightly more pain due to caring for grandchildren.    PERTINENT HISTORY:    PMH: COPD, HTN, HLD, GERD, depression, urinary retention, and arthritis    PAIN:  Are you having pain? Yes: NPRS scale: 2/10 Pain location: L lower back Pain description:  Aggravating factors: standing, lifting, walking, bending, twisting  Relieving factors: laying , leaning     PRECAUTIONS: Other: Needs O2   WEIGHT BEARING RESTRICTIONS No   FALLS:  Has patient fallen in last 6 months? No   LIVING ENVIRONMENT: Lives with: lives alone Lives in: House/apartment Stairs: No, 1st level apts Has following equipment at home: None   Requires  O2 at baseline- 3L- usually sits a 93-94%   OCCUPATION: N/A   PLOF: Independent with basic ADLs   PATIENT GOALS:  Pt states she would like to get back to walking for exercise and reduce the pain.      OBJECTIVE:    DIAGNOSTIC FINDINGS:  IMPRESSION: 1. No acute osseous abnormality in the lumbar spine. Moderate chronic scoliosis. Advanced chronic disc and endplate degeneration at L2-L3. 2. Possible progression of right lateral recess and mild spinal stenosis at L4-L5 since an MRI last year. Query right L5 radiculitis. Evidence of a small cephalad disc extrusion at the L2-L3 left lateral recess. Query left L3 radiculitis. Stable moderate left L2 and right L5 neural foraminal stenosis. 3. Aortic Atherosclerosis (ICD10-I70.0).   PATIENT SURVEYS:  ODI Score = 30 points (60%)    SCREENING FOR RED FLAGS: Bowel or bladder incontinence: Yes:   Spinal tumors: No Cauda equina syndrome: No Compression fracture: No     COGNITION:           Overall cognitive status: Within functional limits for tasks assessed                          SENSATION: WFL     POSTURE:  Decreased lumbar lordosis   PALPATION: TTP bilateral lumbar region and into superior glutes Bilat L/S joint extension stiffness with UPA   LUMBAR ROM:    Active  A/PROM  08/12/2021  Flexion 25% p!  Extension 50% p!  Right lateral flexion 50% p! On L  Left lateral flexion 50% p!  Right rotation 75% p!  Left rotation 75%    (Blank rows = not tested)   LE ROM: WFL for tasks assessed, limited by moderate hip stiffness   LE MMT: no pain   MMT Right 08/12/2021 Left 08/12/2021  Hip flexion 4+/5 4+/5  Hip extension 4-/5 4-/5  Hip abduction 4+/5 4+/5  Hip adduction 4+/5 4+/5  Ankle dorsiflexion 4+/5 4+/5  Ankle plantarflexion 4+/5 4+/5  Ankle inversion 4+/5 4+/5  Ankle eversion 4+/5 4+/5   (Blank rows = not tested)   LUMBAR SPECIAL TESTS:  Slump test: Positive, Stork standing: Negative, and FABER test:  Positive   L3 reflex 2+ bilaterally   FUNCTIONAL TESTS:  08/26/21 :  5 times sit to stand: 17.32 (standard chair, no UE) 4 Stage Balance: <5s during Tandem balance testing   GAIT: Distance walked: 64f Assistive device utilized: None Level of assistance: Complete Independence Comments: carrying O2 tank, compensated Trendelenburg   Stairs: hip rotation compensation with descend, requires UE assist, reciprocal pattern with decreased eccentric lowering strength   VITALS:  During session - spO2 93-94% room air, HR 93-119 bpm     TODAY'S TREATMENT   -NuStep L4:  UE and LE  5 min -seated QL stretch elbow on knee 30s 2x -PPT with ADD bridge 2x10 - LTR with arms in T 3s 10x - hooklying piriformis stretch 30s 3x each -Ab brace with LE extension- hands under L/S 10x each -SKTC 5s 10x each -hip hinge to table 10x - 5x STS (no arms), then STS with eccentric lowering and ab bracing x 3 - paloff press YTB 10x each side      PATIENT EDUCATION:  Education details: anatomy, exercise progression, DOMS expectations, muscle firing,  envelope of function, HEP, POC   Person educated: Patient Education method: Explanation, Demonstration, Tactile cues, Verbal cues, and Handouts Education comprehension: verbalized understanding, returned demonstration, verbal cues required, and tactile cues required     HOME EXERCISE PROGRAM: Access Code: [email or web address] URL: [email or web address] Date: 08/12/2021 Prepared by: Daleen Bo   ASSESSMENT:   CLINICAL IMPRESSION: Pt able to continuation of lumbar ROM progression and LE exercise today without increasing pain. Pt has baseline level of soreness that was not exacerbated with exercise. Pt rquired VC and TC for hip hinging during STS exercise. HEP updated at this time. Pt advised to continue with daily increase in walking as tolerated. Pt is progressing well with therapy at this time. Plan to add isometric core strengthening to HEP as tolerated. Pt  received manual at end of session in order to reduce stiffness and tightness following exercise session. Pt would benefit from continued skilled therapy in order to reach goals and maximize functional lumbopelvic strength and mobility for prevention of further functional decline.     OBJECTIVE IMPAIRMENTS Abnormal gait, cardiopulmonary status limiting activity, decreased activity tolerance, decreased balance, decreased endurance, decreased knowledge of condition, decreased knowledge of use of DME, decreased mobility, difficulty walking, decreased ROM, decreased strength, hypomobility, increased muscle spasms, impaired flexibility, impaired vision/preception, improper body mechanics, postural dysfunction, obesity, and pain.    ACTIVITY LIMITATIONS cleaning, community activity, driving, meal prep, laundry, yard work, shopping, and exercise .    PERSONAL FACTORS Age, Behavior pattern, Fitness, Past/current experiences, Time since onset of injury/illness/exacerbation, and 3+ comorbidities:    are also affecting patient's functional outcome.      REHAB POTENTIAL: Fair     CLINICAL DECISION MAKING: Evolving/moderate complexity   EVALUATION COMPLEXITY: Moderate     GOALS:     SHORT TERM GOALS: Target date: 09/23/2021     Pt will become independent with HEP in order to demonstrate synthesis of PT education. Goal status: INITIAL   2.  Pt will report at least 2 pt reduction on NPRS scale for pain at baseline in order to demonstrate functional improvement with household activity, self care, and ADL.    Goal status: INITIAL   3.  Pt will be able to demonstrate/report ability to sit/stand/sleep for extended periods of time without pain in order to demonstrate functional improvement and tolerance to static positioning.    Goal status: INITIAL     LONG TERM GOALS: Target date: 11/04/2021    Pt  will become independent with final HEP in order to demonstrate synthesis of PT education.   Goal  status: INITIAL   2.  Pt will demonstrate at least a 12.8 improvement in Oswestry Index in order to demonstrate a clinically significant change in LBP and function.    Goal status: INITIAL   3.  Pt will be able to demonstrate/report ability to walk >15 mins in order to demonstrate functional improvement and tolerance to exercise and community mobility.    Goal status:  INITIAL   4.  Pt will be able to demonstrate  in order to demonstrate functional improvement in UE/LE function for self-care and house hold duties.    Goal status: INITIAL       PLAN: PT FREQUENCY: 1-2x/week   PT DURATION: 12 weeks (likely D/C by 8 wks)   PLANNED INTERVENTIONS: Therapeutic exercises, Therapeutic activity, Neuromuscular re-education, Balance training, Gait training, Patient/Family education, Joint manipulation, Joint mobilization, Stair training, DME instructions, Aquatic Therapy, Dry Needling, Electrical stimulation, Spinal manipulation, Spinal mobilization, Cryotherapy, Moist heat, scar mobilization, Splintting, Taping, Vasopneumatic device, Traction, Ultrasound, Ionotophoresis '4mg'$ /ml Dexamethasone, Manual therapy, and Re-evaluation.   PLAN FOR NEXT SESSION: review HEP, STM/joint mobs, lumbar stretching, standing lumbar flexion/ext - add seated side bend and SKTC to HEP if tolerated next visit.    Daleen Bo PT, DPT 08/31/21 2:28 PM

## 2021-09-04 ENCOUNTER — Encounter: Payer: Self-pay | Admitting: Obstetrics and Gynecology

## 2021-09-04 ENCOUNTER — Ambulatory Visit (INDEPENDENT_AMBULATORY_CARE_PROVIDER_SITE_OTHER): Payer: Medicare (Managed Care) | Admitting: Obstetrics and Gynecology

## 2021-09-04 VITALS — BP 113/72 | HR 101

## 2021-09-04 DIAGNOSIS — N8110 Cystocele, unspecified: Secondary | ICD-10-CM

## 2021-09-04 DIAGNOSIS — N952 Postmenopausal atrophic vaginitis: Secondary | ICD-10-CM | POA: Diagnosis not present

## 2021-09-04 MED ORDER — ESTRADIOL 0.1 MG/GM VA CREA: 0.5000 g | TOPICAL_CREAM | VAGINAL | 11 refills | Status: DC

## 2021-09-04 NOTE — Progress Notes (Signed)
[facility]   Subjective:     Chief Complaint:  Chief Complaint  Patient presents with   Pessary Check   History of Present Illness: Monica Scott is a 66 y.o. female with stage III pelvic organ prolapse who presents for a pessary check. She is using a size 3 ring with support pessary. Has noticed a few spots of blood. Otherwise pessary has been working well for her.   Past Medical History: Patient  has a past medical history of Arthritis, Asthma, Bladder disease, COPD (chronic obstructive pulmonary disease) ([place]), Depression, GERD (gastroesophageal reflux disease), Hyperlipidemia, Hypertension, Peptic ulcer, and Urinary retention.   Past Surgical History: She  has a past surgical history that includes Vaginal hysterectomy; Pilonidal cyst excision; Colonoscopy with propofol (N/A, 04/24/2018); Esophagogastroduodenoscopy (egd) with propofol (N/A, 04/24/2018); maloney dilation (N/A, 04/24/2018); biopsy (04/24/2018); and polypectomy (04/24/2018).   Medications: She has a current medication list which includes the following prescription(s): albuterol, albuterol, amlodipine, atorvastatin, bupropion, trelegy ellipta, gabapentin, ibuprofen, ipratropium-albuterol, methocarbamol, olanzapine, olmesartan, omeprazole, and tramadol.   Allergies: Patient is allergic to tizanidine.   Social History: Patient  reports that she quit smoking about 11 years ago. Her smoking use included cigarettes. She has a 60.00 pack-year smoking history. She has never been exposed to tobacco smoke. She has never used smokeless tobacco. She reports that she does not drink alcohol and does not use drugs.      Objective:    Physical Exam: BP 113/72   Pulse (!) 101  Gen: No apparent distress, A&O x 3. Detailed Urogynecologic Evaluation:  Pelvic Exam: Normal external female genitalia; Bartholin's and Skene's glands normal in appearance; urethral meatus normal in appearance, no urethral masses or  discharge. The pessary was noted to be in place. It was removed and cleaned. Speculum exam revealed small abrasion at the anterior/ apex of the vagina. This was treated with silver nitrate.  The pessary was replaced. It was comfortable to the patient and fit well.   POP-Q (01/27/21):    POP-Q   2                                            Aa   2                                           Ba   -4                                              C    5                                            Gh   2                                            Pb   8  tvl    -2.5                                            Ap   -2.5                                            Bp                                                  D          Assessment/Plan:    Assessment: Monica Scott is a 66 y.o. with stage III pelvic organ prolapse here for a pessary check.   Plan: - Continue #3 ring with support  - start vaginal estrogen 0.5g nightly for two weeks then twice a week after - She will follow-up in 3 months for a pessary check or sooner as needed.   Jaquita Folds, MD

## 2021-09-07 ENCOUNTER — Ambulatory Visit: Payer: Medicare (Managed Care) | Attending: Nurse Practitioner | Admitting: Nurse Practitioner

## 2021-09-07 ENCOUNTER — Encounter: Payer: Self-pay | Admitting: Nurse Practitioner

## 2021-09-07 VITALS — BP 100/66 | HR 92 | Temp 98.6°F | Ht 59.0 in | Wt 164.6 lb

## 2021-09-07 DIAGNOSIS — D72829 Elevated white blood cell count, unspecified: Secondary | ICD-10-CM

## 2021-09-07 DIAGNOSIS — I10 Essential (primary) hypertension: Secondary | ICD-10-CM

## 2021-09-07 DIAGNOSIS — F329 Major depressive disorder, single episode, unspecified: Secondary | ICD-10-CM

## 2021-09-07 DIAGNOSIS — I700 Atherosclerosis of aorta: Secondary | ICD-10-CM

## 2021-09-07 DIAGNOSIS — K219 Gastro-esophageal reflux disease without esophagitis: Secondary | ICD-10-CM

## 2021-09-07 DIAGNOSIS — Z23 Encounter for immunization: Secondary | ICD-10-CM | POA: Diagnosis not present

## 2021-09-07 DIAGNOSIS — G2581 Restless legs syndrome: Secondary | ICD-10-CM

## 2021-09-07 DIAGNOSIS — E782 Mixed hyperlipidemia: Secondary | ICD-10-CM

## 2021-09-07 DIAGNOSIS — R7303 Prediabetes: Secondary | ICD-10-CM

## 2021-09-07 MED ORDER — ATORVASTATIN CALCIUM 20 MG PO TABS: 20.0000 mg | ORAL_TABLET | Freq: Every day | ORAL | 1 refills | Status: DC

## 2021-09-07 MED ORDER — OMEPRAZOLE 40 MG PO CPDR: 40.0000 mg | DELAYED_RELEASE_CAPSULE | Freq: Two times a day (BID) | ORAL | 0 refills | Status: DC

## 2021-09-07 MED ORDER — AMLODIPINE BESYLATE 5 MG PO TABS: 5.0000 mg | ORAL_TABLET | Freq: Two times a day (BID) | ORAL | 1 refills | Status: DC

## 2021-09-07 MED ORDER — OLMESARTAN MEDOXOMIL 40 MG PO TABS: 40.0000 mg | ORAL_TABLET | Freq: Every day | ORAL | 1 refills | Status: DC

## 2021-09-07 MED ORDER — GABAPENTIN 400 MG PO CAPS: 400.0000 mg | ORAL_CAPSULE | Freq: Three times a day (TID) | ORAL | 6 refills | Status: DC

## 2021-09-07 MED ORDER — BUPROPION HCL ER (XL) 150 MG PO TB24: 150.0000 mg | ORAL_TABLET | Freq: Every day | ORAL | 1 refills | Status: DC

## 2021-09-07 MED ORDER — METHOCARBAMOL 500 MG PO TABS: 500.0000 mg | ORAL_TABLET | Freq: Two times a day (BID) | ORAL | 2 refills | Status: DC | PRN

## 2021-09-07 NOTE — Progress Notes (Signed)
Assessment & Plan:  Monica Scott was seen today for hypertension.  Diagnoses and all orders for this visit:  Essential hypertension -     Discontinue: olmesartan (BENICAR) 40 MG tablet; Take 1 tablet (40 mg total) by mouth daily. -     Discontinue: amLODipine (NORVASC) 5 MG tablet; Take 1 tablet (5 mg total) by mouth 2 (two) times daily. -     CMP14+EGFR -     amLODipine (NORVASC) 5 MG tablet; Take 1 tablet (5 mg total) by mouth 2 (two) times daily. -     olmesartan (BENICAR) 40 MG tablet; Take 1 tablet (40 mg total) by mouth daily.  Restless leg syndrome -     gabapentin (NEURONTIN) 400 MG capsule; Take 1 capsule (400 mg total) by mouth 3 (three) times daily. -     methocarbamol (ROBAXIN) 500 MG tablet; Take 1 tablet (500 mg total) by mouth 2 (two) times daily as needed for muscle spasms.   Gastroesophageal reflux disease without esophagitis -     omeprazole (PRILOSEC) 40 MG capsule; Take 1 capsule (40 mg total) by mouth in the morning and at bedtime. INSTRUCTIONS: Avoid GERD Triggers: acidic, spicy or fried foods, caffeine, coffee, sodas,  alcohol and chocolate.    Leukocytosis, unspecified type -     CBC with Differential  Need for shingles vaccine -     Varicella-zoster vaccine IM (Shingrix)  Mixed hyperlipidemia -     atorvastatin (LIPITOR) 20 MG tablet; Take 1 tablet (20 mg total) by mouth daily.  Prediabetes -     Hemoglobin A1c  Major depressive disorder with single episode, remission status unspecified -     buPROPion (WELLBUTRIN XL) 150 MG 24 hr tablet; Take 1 tablet (150 mg total) by mouth at bedtime. .  Aortic Atherosclerosis Continue statin as prescribed  Patient has been counseled on age-appropriate routine health concerns for screening and prevention. These are reviewed and up-to-date. Referrals have been placed accordingly. Immunizations are up-to-date or declined.    Subjective:   Chief Complaint  Patient presents with   Hypertension   HPI Monica Scott 66 y.o. female presents to office today for follow up to HTN. She is doing well today. Requesting medication refills.   She has a past medical history of Arthritis, Asthma, Bladder disease, COPD, Depression, GERD, Hyperlipidemia, Hypertension, Peptic ulcer, and Urinary retention.   Patient has been counseled on age-appropriate routine health concerns for screening and prevention. These are reviewed and up-to-date. Referrals have been placed accordingly. Immunizations are up-to-date or declined.     COLONOSCOPY: scheduled for 09-21-21 MAMMOGRAM: UTD 08-13-21 PAP SMEAR: NONE> Hysterectomy VACCINES: Shingles vaccine administered today   HTN BP well controlled with benicar 40 mg daily and she takes amlodipine 5 mg BID.  BP Readings from Last 3 Encounters:  09/07/21 100/66  09/04/21 113/72  08/03/21 122/80     Review of Systems  Constitutional:  Negative for fever, malaise/fatigue and weight loss.  HENT: Negative.  Negative for nosebleeds.   Eyes: Negative.  Negative for blurred vision, double vision and photophobia.  Respiratory: Negative.  Negative for cough and shortness of breath.   Cardiovascular: Negative.  Negative for chest pain, palpitations and leg swelling.  Gastrointestinal: Negative.  Negative for heartburn, nausea and vomiting.  Musculoskeletal: Negative.  Negative for myalgias.  Neurological: Negative.  Negative for dizziness, focal weakness, seizures and headaches.  Psychiatric/Behavioral: Negative.  Negative for suicidal ideas.     Past Medical History:  Diagnosis Date  Arthritis    Asthma    Bladder disease    COPD (chronic obstructive pulmonary disease) ([facility])    Depression    GERD (gastroesophageal reflux disease)    Hyperlipidemia    Hypertension    Peptic ulcer    Urinary retention     Past Surgical History:  Procedure Laterality Date   BIOPSY  04/24/2018   Procedure: BIOPSY;  Surgeon: Daneil Dolin, MD;  [facility];  Service:  Endoscopy;;  esophagus   COLONOSCOPY WITH PROPOFOL N/A 04/24/2018   Procedure: COLONOSCOPY WITH PROPOFOL;  Surgeon: Daneil Dolin, MD;  [facility];  Service: Endoscopy;  Laterality: N/A;  9:00am   ESOPHAGOGASTRODUODENOSCOPY (EGD) WITH PROPOFOL N/A 04/24/2018   Procedure: ESOPHAGOGASTRODUODENOSCOPY (EGD) WITH PROPOFOL;  Surgeon: Daneil Dolin, MD;  [facility];  Service: Endoscopy;  Laterality: N/A;   MALONEY DILATION N/A 04/24/2018   Procedure: Venia Minks DILATION;  Surgeon: Daneil Dolin, MD;  [facility];  Service: Endoscopy;  Laterality: N/A;   PILONIDAL CYST EXCISION     age 75   POLYPECTOMY  04/24/2018   Procedure: POLYPECTOMY;  Surgeon: Daneil Dolin, MD;  [facility];  Service: Endoscopy;;  colon    VAGINAL HYSTERECTOMY      Family History  Problem Relation Age of Onset   Alcohol abuse Mother    Arthritis Mother    Cancer Mother    Depression Mother    Early death Mother    Alcohol abuse Father    Depression Father    Alcohol abuse Sister    Depression Sister    Alcohol abuse Daughter    Depression Daughter    COPD Maternal Grandfather    Heart disease Maternal Grandfather    Hypertension Maternal Grandfather    Alcohol abuse Brother    Colon cancer Brother        deceased at age 41   Alcohol abuse Son    Breast cancer Neg Hx     Social History Reviewed with no changes to be made today.   Outpatient Medications Prior to Visit  Medication Sig Dispense Refill   albuterol (PROVENTIL) (2.5 MG/3ML) 0.083% nebulizer solution Take 3 mLs (2.5 mg total) by nebulization every 6 (six) hours as needed for wheezing or shortness of breath. 360 mL 5   albuterol (VENTOLIN HFA) 108 (90 Base) MCG/ACT inhaler INHALE 1-2 PUFFS EVERY 6 HOURS AS NEEDED FOR WHEEZE OR SHORTNESS OF BREATH 8.5 each 1   estradiol (ESTRACE) 0.1 MG/GM vaginal cream Place 0.5 g vaginally 2 (two) times a week. Place 0.5g nightly for two weeks then twice a week  after 30 g 11   Fluticasone-Umeclidin-Vilant (TRELEGY ELLIPTA) 100-62.5-25 MCG/ACT AEPB Inhale 1 puff into the lungs daily. 28 each 5   ibuprofen (ADVIL,MOTRIN) 200 MG tablet Take 400 mg by mouth as needed for moderate pain.     ipratropium-albuterol (DUONEB) 0.5-2.5 (3) MG/3ML SOLN USE 1 AMPULE IN NEBULIZER EVERY 6 HOURS AS NEEDED 360 mL 2   OLANZapine (ZYPREXA) 5 MG tablet Take 10 mg by mouth at bedtime.      traMADol (ULTRAM) 50 MG tablet Take by mouth 2 (two) times daily.     amLODipine (NORVASC) 5 MG tablet TAKE 1 TABLET BY MOUTH TWICE DAILY TO  LOWER  BLOOD  PRESSURE 60 tablet 0   atorvastatin (LIPITOR) 20 MG tablet Take 20 mg by mouth daily.     buPROPion (WELLBUTRIN XL) 150 MG 24 hr  tablet Take 150 mg by mouth at bedtime.     gabapentin (NEURONTIN) 400 MG capsule Take 1 capsule (400 mg total) by mouth 3 (three) times daily. 90 capsule 11   methocarbamol (ROBAXIN) 500 MG tablet Take 1 tablet (500 mg total) by mouth 2 (two) times daily as needed for muscle spasms. 20 tablet 0   olmesartan (BENICAR) 40 MG tablet TAKE 1 TABLET BY MOUTH EVERY DAY 90 tablet 0   omeprazole (PRILOSEC) 40 MG capsule TAKE 1 CAPSULE (40 MG TOTAL) BY MOUTH IN THE MORNING AND AT BEDTIME. 180 capsule 0   No facility-administered medications prior to visit.    Allergies  Allergen Reactions   Tizanidine Other (See Comments)    Caused dizziness and dry mouth. Patient prefers not to take this again.       Objective:    BP 100/66   Pulse 92   Temp 98.6 F (37 C) (Oral)   Ht 4' 11"  (1.499 m)   Wt 164 lb 9.6 oz (74.7 kg)   SpO2 90%   BMI 33.25 kg/m  Wt Readings from Last 3 Encounters:  09/07/21 164 lb 9.6 oz (74.7 kg)  08/04/21 163 lb (73.9 kg)  08/03/21 163 lb 6.4 oz (74.1 kg)    Physical Exam Vitals and nursing note reviewed.  Constitutional:      Appearance: She is well-developed.  HENT:     Head: Normocephalic and atraumatic.  Cardiovascular:     Rate and Rhythm: Normal rate and regular rhythm.      Heart sounds: Normal heart sounds. No murmur heard.    No friction rub. No gallop.  Pulmonary:     Effort: Pulmonary effort is normal. No tachypnea or respiratory distress.     Breath sounds: Normal breath sounds. No decreased breath sounds, wheezing, rhonchi or rales.  Chest:     Chest wall: No tenderness.  Abdominal:     General: Bowel sounds are normal.     Palpations: Abdomen is soft.  Musculoskeletal:        General: Normal range of motion.     Cervical back: Normal range of motion.  Skin:    General: Skin is warm and dry.  Neurological:     Mental Status: She is alert and oriented to person, place, and time.     Coordination: Coordination normal.  Psychiatric:        Behavior: Behavior normal. Behavior is cooperative.        Thought Content: Thought content normal.        Judgment: Judgment normal.          Patient has been counseled extensively about nutrition and exercise as well as the importance of adherence with medications and regular follow-up. The patient was given clear instructions to go to ER or return to medical center if symptoms don't improve, worsen or new problems develop. The patient verbalized understanding.   Follow-up: Return in about 6 months (around 03/09/2022).   Gildardo Pounds, FNP-BC [facility] [place], [place]   09/07/2021, 8:46 PM

## 2021-09-08 LAB — CBC WITH DIFFERENTIAL/PLATELET
Basophils Absolute: 0.1 10*3/uL (ref 0.0–0.2)
Basos: 1 %
EOS (ABSOLUTE): 0.2 10*3/uL (ref 0.0–0.4)
Eos: 2 %
Hematocrit: 36 % (ref 34.0–46.6)
Hemoglobin: 11.8 g/dL (ref 11.1–15.9)
Immature Grans (Abs): 0 10*3/uL (ref 0.0–0.1)
Immature Granulocytes: 0 %
Lymphocytes Absolute: 2.4 10*3/uL (ref 0.7–3.1)
Lymphs: 31 %
MCH: 29.4 pg (ref 26.6–33.0)
MCHC: 32.8 g/dL (ref 31.5–35.7)
MCV: 90 fL (ref 79–97)
Monocytes Absolute: 0.7 10*3/uL (ref 0.1–0.9)
Monocytes: 9 %
Neutrophils Absolute: 4.4 10*3/uL (ref 1.4–7.0)
Neutrophils: 57 %
Platelets: 384 10*3/uL (ref 150–450)
RBC: 4.02 x10E6/uL (ref 3.77–5.28)
RDW: 13.1 % (ref 11.7–15.4)
WBC: 7.7 10*3/uL (ref 3.4–10.8)

## 2021-09-08 LAB — CMP14+EGFR
ALT: 10 IU/L (ref 0–32)
AST: 12 IU/L (ref 0–40)
Albumin/Globulin Ratio: 2.2 (ref 1.2–2.2)
Albumin: 4.1 g/dL (ref 3.8–4.8)
Alkaline Phosphatase: 114 IU/L (ref 44–121)
BUN/Creatinine Ratio: 5 — ABNORMAL LOW (ref 12–28)
BUN: 4 mg/dL — ABNORMAL LOW (ref 8–27)
Bilirubin Total: 0.3 mg/dL (ref 0.0–1.2)
CO2: 24 mmol/L (ref 20–29)
Calcium: 9 mg/dL (ref 8.7–10.3)
Chloride: 100 mmol/L (ref 96–106)
Creatinine, Ser: 0.82 mg/dL (ref 0.57–1.00)
Globulin, Total: 1.9 g/dL (ref 1.5–4.5)
Glucose: 73 mg/dL (ref 70–99)
Potassium: 3.7 mmol/L (ref 3.5–5.2)
Sodium: 140 mmol/L (ref 134–144)
Total Protein: 6 g/dL (ref 6.0–8.5)
eGFR: 79 mL/min/{1.73_m2} (ref >59)

## 2021-09-08 LAB — HEMOGLOBIN A1C
Est. average glucose Bld gHb Est-mCnc: 120 mg/dL
Hgb A1c MFr Bld: 5.8 % — ABNORMAL HIGH (ref 4.8–5.6)

## 2021-09-09 ENCOUNTER — Ambulatory Visit (HOSPITAL_BASED_OUTPATIENT_CLINIC_OR_DEPARTMENT_OTHER): Payer: Medicare (Managed Care) | Admitting: Physical Therapy

## 2021-09-14 ENCOUNTER — Ambulatory Visit (HOSPITAL_BASED_OUTPATIENT_CLINIC_OR_DEPARTMENT_OTHER): Payer: Medicare (Managed Care) | Admitting: Physical Therapy

## 2021-09-14 ENCOUNTER — Encounter (HOSPITAL_BASED_OUTPATIENT_CLINIC_OR_DEPARTMENT_OTHER): Payer: Self-pay | Admitting: Physical Therapy

## 2021-09-14 DIAGNOSIS — M6281 Muscle weakness (generalized): Secondary | ICD-10-CM

## 2021-09-14 DIAGNOSIS — M5459 Other low back pain: Secondary | ICD-10-CM | POA: Diagnosis not present

## 2021-09-14 DIAGNOSIS — M25552 Pain in left hip: Secondary | ICD-10-CM

## 2021-09-14 DIAGNOSIS — G8929 Other chronic pain: Secondary | ICD-10-CM

## 2021-09-14 NOTE — Therapy (Addendum)
OUTPATIENT PHYSICAL THERAPY TREATMENT NOTE  PHYSICAL THERAPY DISCHARGE SUMMARY  Visits from Start of Care: 4  Plan: Patient agrees to discharge.  Patient goals were not met. Patient is being discharged due to not returning to therapy.       Patient Name: Monica Scott MRN: [id number] DOB:[birth year], 66 y.o., female Today's Date: 09/14/2021     PT End of Session - 09/14/21 1340     Visit Number 4    Number of Visits 19    Date for PT Re-Evaluation 11/10/21    Authorization Type [employer]    PT Start Time 9311    PT Stop Time 1423    PT Time Calculation (min) 38 min    Activity Tolerance Patient tolerated treatment well    Behavior During Therapy WFL for tasks assessed/performed             Past Medical History:  Diagnosis Date   Arthritis    Asthma    Bladder disease    COPD (chronic obstructive pulmonary disease) ([place])    Depression    GERD (gastroesophageal reflux disease)    Hyperlipidemia    Hypertension    Peptic ulcer    Urinary retention    Past Surgical History:  Procedure Laterality Date   BIOPSY  04/24/2018   Procedure: BIOPSY;  Surgeon: Daneil Dolin, MD;  [facility];  Service: Endoscopy;;  esophagus   COLONOSCOPY WITH PROPOFOL N/A 04/24/2018   Procedure: COLONOSCOPY WITH PROPOFOL;  Surgeon: Daneil Dolin, MD;  [facility];  Service: Endoscopy;  Laterality: N/A;  9:00am   ESOPHAGOGASTRODUODENOSCOPY (EGD) WITH PROPOFOL N/A 04/24/2018   Procedure: ESOPHAGOGASTRODUODENOSCOPY (EGD) WITH PROPOFOL;  Surgeon: Daneil Dolin, MD;  [facility];  Service: Endoscopy;  Laterality: N/A;   MALONEY DILATION N/A 04/24/2018   Procedure: Venia Minks DILATION;  Surgeon: Daneil Dolin, MD;  [facility];  Service: Endoscopy;  Laterality: N/A;   PILONIDAL CYST EXCISION     age 50   POLYPECTOMY  04/24/2018   Procedure: POLYPECTOMY;  Surgeon: Daneil Dolin, MD;  [facility];  Service:  Endoscopy;;  colon    VAGINAL HYSTERECTOMY     Patient Active Problem List   Diagnosis Date Noted   Preop pulmonary/respiratory exam 03/09/2021   Physical deconditioning 04/07/2020   Dyspnea 02/05/2019   Chronic respiratory failure with hypoxia ([place]) 02/05/2019   Daytime sleepiness 02/05/2019   Pelvic relaxation due to cystocele, midline 01/16/2019   Emphysema of lung ([place]) 06/14/2018   Aortic atherosclerosis ([place]) 06/14/2018   Atherosclerosis of coronary artery 06/14/2018   Thoracic aortic ectasia ([place]) 05/19/2018   Esophageal dysphagia 03/01/2018   Family history of colon cancer 03/01/2018   Diarrhea 03/01/2018   Near syncope 10/17/2017   Hyperlipidemia 10/17/2017   GERD (gastroesophageal reflux disease) 10/17/2017   Bladder disease or syndrome 10/17/2017   Depression 10/17/2017   Hypertension, essential, benign 02/19/2017   COPD (chronic obstructive pulmonary disease) ([place]) 02/11/2017   Chest pain 02/12/2015   Referral of patient 02/12/2015     PCP:    Gildardo Pounds, NP       REFERRING PROVIDER: Gentry Fitz, MD    REFERRING DIAG: Low back pain with lumbar ddd; SI joint pain    THERAPY DIAG:  Other low back pain   Muscle weakness (generalized)   ONSET DATE: 2022   SUBJECTIVE:  SUBJECTIVE STATEMENT: Pt state the L side of her back is "out" today. She picked up her grandson yesterday. She denies feeling something give way. Pt states she was feeling better prior to this occurring. Pt states the pain started this morning when she woke up.   PERTINENT HISTORY:    PMH: COPD, HTN, HLD, GERD, depression, urinary retention, and arthritis    PAIN:  Are you having pain? Yes: NPRS scale: 6/10 Pain location: L lower back Pain description:  Aggravating factors: standing,  lifting, walking, bending, twisting  Relieving factors: laying , leaning     PRECAUTIONS: Other: Needs O2   WEIGHT BEARING RESTRICTIONS No   FALLS:  Has patient fallen in last 6 months? No   LIVING ENVIRONMENT: Lives with: lives alone Lives in: House/apartment Stairs: No, 1st level apts Has following equipment at home: None   Requires O2 at baseline- 3L- usually sits a 93-94%   OCCUPATION: N/A   PLOF: Independent with basic ADLs   PATIENT GOALS:  Pt states she would like to get back to walking for exercise and reduce the pain.      OBJECTIVE:    DIAGNOSTIC FINDINGS:  IMPRESSION: 1. No acute osseous abnormality in the lumbar spine. Moderate chronic scoliosis. Advanced chronic disc and endplate degeneration at L2-L3. 2. Possible progression of right lateral recess and mild spinal stenosis at L4-L5 since an MRI last year. Query right L5 radiculitis. Evidence of a small cephalad disc extrusion at the L2-L3 left lateral recess. Query left L3 radiculitis. Stable moderate left L2 and right L5 neural foraminal stenosis. 3. Aortic Atherosclerosis (ICD10-I70.0).   PATIENT SURVEYS:  ODI Score = 30 points (60%)    SCREENING FOR RED FLAGS: Bowel or bladder incontinence: Yes:   Spinal tumors: No Cauda equina syndrome: No Compression fracture: No     COGNITION:           Overall cognitive status: Within functional limits for tasks assessed                          SENSATION: WFL     POSTURE:  Decreased lumbar lordosis   PALPATION: TTP bilateral lumbar region and into superior glutes Bilat L/S joint extension stiffness with UPA   LUMBAR ROM:    Active  A/PROM  08/12/2021  Flexion 25% p!  Extension 50% p!  Right lateral flexion 50% p! On L  Left lateral flexion 50% p!  Right rotation 75% p!  Left rotation 75%    (Blank rows = not tested)   LE ROM: WFL for tasks assessed, limited by moderate hip stiffness   LE MMT: no pain   MMT Right 08/12/2021  Left 08/12/2021  Hip flexion 4+/5 4+/5  Hip extension 4-/5 4-/5  Hip abduction 4+/5 4+/5  Hip adduction 4+/5 4+/5  Ankle dorsiflexion 4+/5 4+/5  Ankle plantarflexion 4+/5 4+/5  Ankle inversion 4+/5 4+/5  Ankle eversion 4+/5 4+/5   (Blank rows = not tested)   LUMBAR SPECIAL TESTS:  Slump test: Positive, Stork standing: Negative, and FABER test: Positive   L3 reflex 2+ bilaterally   FUNCTIONAL TESTS:  08/26/21 :  5 times sit to stand: 17.32 (standard chair, no UE) 4 Stage Balance: <5s during Tandem balance testing   GAIT: Distance walked: 33f Assistive device utilized: None Level of assistance: Complete Independence Comments: carrying O2 tank, compensated Trendelenburg   Stairs: hip rotation compensation with descend, requires UE assist, reciprocal pattern with decreased eccentric lowering strength  VITALS:  During session - spO2 93-94% room air, HR 93-119 bpm     TODAY'S TREATMENT  STM: L glute, QL, and L3-5 paraspinals  -seated QL stretch elbow on knee 30s 2x -PPT with bridge 2x10 - LTR with arms in T 3s 10x - hooklying piriformis stretch 30s 3x each -Ab brace with LE extension- hands under L/S 10x each -SKTC 5s 10x each -hip hinge to table 10x -3 way flexion stretch with stool 3s 5x in each direction -demo of lifting mechanics and protective strategies  (On hold) - 5x STS (no arms), then STS with eccentric lowering and ab bracing x 3 - paloff press YTB 10x each side   PATIENT EDUCATION:  Education details: lifting mechanics, anatomy, exercise progression, DOMS expectations, muscle firing,  envelope of function, HEP, POC   Person educated: Patient Education method: Explanation, Demonstration, Tactile cues, Verbal cues, and Handouts Education comprehension: verbalized understanding, returned demonstration, verbal cues required, and tactile cues required     HOME EXERCISE PROGRAM: Access Code: [email or web address] URL: [email or web address] Date:  08/12/2021 Prepared by: Daleen Bo   ASSESSMENT:   CLINICAL IMPRESSION: Pt presents with elevated pain due to likely lumbar strain following picking up grandson. Pt was able to reduce 6/10 pain to 2/10 following manual therapy and exercise. Pt was able to tolerate light stretching of her hip and low back without recreation of pain into her L SIJ. Pt advised to pick back up with HEP in order to progress functional mobility and strength. Pt is still likely strength limited at this time if most recent pain occurred during functional lifting tasks. Revisit lifting mechanics at next session. Verbal edu provided today. Pt would benefit from continued skilled therapy in order to reach goals and maximize functional lumbopelvic strength and mobility for prevention of further functional decline.     OBJECTIVE IMPAIRMENTS Abnormal gait, cardiopulmonary status limiting activity, decreased activity tolerance, decreased balance, decreased endurance, decreased knowledge of condition, decreased knowledge of use of DME, decreased mobility, difficulty walking, decreased ROM, decreased strength, hypomobility, increased muscle spasms, impaired flexibility, impaired vision/preception, improper body mechanics, postural dysfunction, obesity, and pain.    ACTIVITY LIMITATIONS cleaning, community activity, driving, meal prep, laundry, yard work, shopping, and exercise .    PERSONAL FACTORS Age, Behavior pattern, Fitness, Past/current experiences, Time since onset of injury/illness/exacerbation, and 3+ comorbidities:    are also affecting patient's functional outcome.      REHAB POTENTIAL: Fair     CLINICAL DECISION MAKING: Evolving/moderate complexity   EVALUATION COMPLEXITY: Moderate     GOALS:     SHORT TERM GOALS: Target date: 09/23/2021     Pt will become independent with HEP in order to demonstrate synthesis of PT education. Goal status: INITIAL   2.  Pt will report at least 2 pt reduction on NPRS scale for  pain at baseline in order to demonstrate functional improvement with household activity, self care, and ADL.    Goal status: INITIAL   3.  Pt will be able to demonstrate/report ability to sit/stand/sleep for extended periods of time without pain in order to demonstrate functional improvement and tolerance to static positioning.    Goal status: INITIAL     LONG TERM GOALS: Target date: 11/04/2021    Pt  will become independent with final HEP in order to demonstrate synthesis of PT education.   Goal status: INITIAL   2.  Pt will demonstrate at least a 12.8 improvement in Oswestry Index in order to demonstrate a clinically significant change  in LBP and function.    Goal status: INITIAL   3.  Pt will be able to demonstrate/report ability to walk >15 mins in order to demonstrate functional improvement and tolerance to exercise and community mobility.    Goal status: INITIAL   4.  Pt will be able to demonstrate  in order to demonstrate functional improvement in UE/LE function for self-care and house hold duties.    Goal status: INITIAL       PLAN: PT FREQUENCY: 1-2x/week   PT DURATION: 12 weeks (likely D/C by 8 wks)   PLANNED INTERVENTIONS: Therapeutic exercises, Therapeutic activity, Neuromuscular re-education, Balance training, Gait training, Patient/Family education, Joint manipulation, Joint mobilization, Stair training, DME instructions, Aquatic Therapy, Dry Needling, Electrical stimulation, Spinal manipulation, Spinal mobilization, Cryotherapy, Moist heat, scar mobilization, Splintting, Taping, Vasopneumatic device, Traction, Ultrasound, Ionotophoresis 85m/ml Dexamethasone, Manual therapy, and Re-evaluation.   PLAN FOR NEXT SESSION: review HEP, STM/joint mobs, lumbar stretching, lifting mechanics   ADaleen BoPT, DPT 09/14/21 2:26 PM

## 2021-09-16 ENCOUNTER — Other Ambulatory Visit: Payer: Self-pay

## 2021-09-16 ENCOUNTER — Telehealth: Payer: Self-pay | Admitting: Acute Care

## 2021-09-16 DIAGNOSIS — Z122 Encounter for screening for malignant neoplasm of respiratory organs: Secondary | ICD-10-CM

## 2021-09-16 DIAGNOSIS — Z87891 Personal history of nicotine dependence: Secondary | ICD-10-CM

## 2021-09-16 NOTE — Telephone Encounter (Signed)
Spoke with patient by phone.  She will not need the sdmv prior to her next LDCT.  Have mailed letter of appts to patient.  Explained she will just need to go to CT appt on the letter and to disregard the sdmv (first appt which is by phone).  She has completed this requirement. Patient acknowledged understanding.

## 2021-09-18 ENCOUNTER — Other Ambulatory Visit: Payer: Self-pay | Admitting: Nurse Practitioner

## 2021-09-18 DIAGNOSIS — R7303 Prediabetes: Secondary | ICD-10-CM

## 2021-09-18 DIAGNOSIS — E669 Obesity, unspecified: Secondary | ICD-10-CM

## 2021-09-21 ENCOUNTER — Encounter (HOSPITAL_COMMUNITY): Payer: Self-pay | Admitting: Internal Medicine

## 2021-09-21 ENCOUNTER — Other Ambulatory Visit: Payer: Self-pay

## 2021-09-21 ENCOUNTER — Encounter (HOSPITAL_COMMUNITY)
Admission: RE | Disposition: A | Discharge status: Home / Self Care | Payer: Self-pay | Source: Home / Self Care | Attending: Internal Medicine

## 2021-09-21 ENCOUNTER — Ambulatory Visit (HOSPITAL_BASED_OUTPATIENT_CLINIC_OR_DEPARTMENT_OTHER): Payer: Medicare (Managed Care) | Admitting: Anesthesiology

## 2021-09-21 ENCOUNTER — Ambulatory Visit (HOSPITAL_COMMUNITY): Payer: Medicare (Managed Care) | Admitting: Anesthesiology

## 2021-09-21 ENCOUNTER — Ambulatory Visit (HOSPITAL_COMMUNITY)
Admission: RE | Admit: 2021-09-21 | Discharge: 2021-09-21 | Disposition: A | Discharge status: Home / Self Care | Payer: Medicare (Managed Care) | Attending: Internal Medicine | Admitting: Internal Medicine

## 2021-09-21 DIAGNOSIS — Z1211 Encounter for screening for malignant neoplasm of colon: Secondary | ICD-10-CM | POA: Diagnosis present

## 2021-09-21 DIAGNOSIS — I10 Essential (primary) hypertension: Secondary | ICD-10-CM | POA: Insufficient documentation

## 2021-09-21 DIAGNOSIS — Z87891 Personal history of nicotine dependence: Secondary | ICD-10-CM | POA: Diagnosis not present

## 2021-09-21 DIAGNOSIS — K219 Gastro-esophageal reflux disease without esophagitis: Secondary | ICD-10-CM | POA: Insufficient documentation

## 2021-09-21 DIAGNOSIS — Z9981 Dependence on supplemental oxygen: Secondary | ICD-10-CM | POA: Insufficient documentation

## 2021-09-21 DIAGNOSIS — Z09 Encounter for follow-up examination after completed treatment for conditions other than malignant neoplasm: Secondary | ICD-10-CM | POA: Diagnosis not present

## 2021-09-21 DIAGNOSIS — K5730 Diverticulosis of large intestine without perforation or abscess without bleeding: Secondary | ICD-10-CM | POA: Diagnosis not present

## 2021-09-21 DIAGNOSIS — Z8711 Personal history of peptic ulcer disease: Secondary | ICD-10-CM | POA: Insufficient documentation

## 2021-09-21 DIAGNOSIS — J449 Chronic obstructive pulmonary disease, unspecified: Secondary | ICD-10-CM | POA: Insufficient documentation

## 2021-09-21 DIAGNOSIS — K5790 Diverticulosis of intestine, part unspecified, without perforation or abscess without bleeding: Secondary | ICD-10-CM

## 2021-09-21 DIAGNOSIS — Z800 Family history of malignant neoplasm of digestive organs: Secondary | ICD-10-CM | POA: Insufficient documentation

## 2021-09-21 DIAGNOSIS — K635 Polyp of colon: Secondary | ICD-10-CM

## 2021-09-21 DIAGNOSIS — Z86010 Personal history of colonic polyps: Secondary | ICD-10-CM | POA: Diagnosis not present

## 2021-09-21 DIAGNOSIS — D124 Benign neoplasm of descending colon: Secondary | ICD-10-CM | POA: Diagnosis not present

## 2021-09-21 HISTORY — PX: COLONOSCOPY WITH PROPOFOL: SHX5780

## 2021-09-21 HISTORY — PX: POLYPECTOMY: SHX5525

## 2021-09-21 SURGERY — COLONOSCOPY WITH PROPOFOL
Anesthesia: General

## 2021-09-21 MED ORDER — LIDOCAINE HCL (CARDIAC) PF 100 MG/5ML IV SOSY
PREFILLED_SYRINGE | INTRAVENOUS | Status: DC | PRN
  Administered 2021-09-21: 50 mg via INTRAVENOUS

## 2021-09-21 MED ORDER — LACTATED RINGERS IV SOLN: INTRAVENOUS | Status: DC | PRN

## 2021-09-21 MED ORDER — PROPOFOL 500 MG/50ML IV EMUL
INTRAVENOUS | Status: DC | PRN
  Administered 2021-09-21: 125 ug/kg/min via INTRAVENOUS

## 2021-09-21 MED ORDER — LACTATED RINGERS IV SOLN: INTRAVENOUS | Status: DC

## 2021-09-21 MED ORDER — PROPOFOL 10 MG/ML IV BOLUS
INTRAVENOUS | Status: DC | PRN
  Administered 2021-09-21: 20 mg via INTRAVENOUS
  Administered 2021-09-21: 80 mg via INTRAVENOUS

## 2021-09-21 NOTE — Transfer of Care (Signed)
Immediate Anesthesia Transfer of Care Note  Patient: Monica Scott  Procedure(s) Performed: COLONOSCOPY WITH PROPOFOL POLYPECTOMY  Patient Location: Endoscopy Unit  Anesthesia Type:General  Level of Consciousness: awake  Airway & Oxygen Therapy: Patient Spontanous Breathing and Patient connected to nasal cannula oxygen  Post-op Assessment: Report given to RN and Post -op Vital signs reviewed and stable  Post vital signs: Reviewed and stable  Last Vitals:  Vitals Value Taken Time  BP    Temp    Pulse 101   Resp 22   SpO2 97%     Last Pain:  Vitals:   09/21/21 1257  TempSrc:   PainSc: 0-No pain      Patients Stated Pain Goal: 6 (09/21/21 1219)  Complications: No notable events documented.   Pt on home dose of 3L O2

## 2021-09-23 LAB — SURGICAL PATHOLOGY

## 2021-09-23 NOTE — Anesthesia Postprocedure Evaluation (Signed)
Anesthesia Post Note  Patient: Monica Scott  Procedure(s) Performed: COLONOSCOPY WITH PROPOFOL POLYPECTOMY  Patient location during evaluation: Phase II Anesthesia Type: General Level of consciousness: awake Pain management: pain level controlled Vital Signs Assessment: post-procedure vital signs reviewed and stable Respiratory status: spontaneous breathing and respiratory function stable Cardiovascular status: blood pressure returned to baseline and stable Postop Assessment: no headache and no apparent nausea or vomiting Anesthetic complications: no Comments: Late entry   No notable events documented.   Last Vitals:  Vitals:   09/21/21 1219 09/21/21 1322  BP: 134/79 (!) 95/58  Pulse: (!) 103   Resp: (!) 23 (!) 23  Temp: 36.8 C 37 C  SpO2: 91% 96%    Last Pain:  Vitals:   09/21/21 1322  TempSrc: Oral  PainSc: 0-No pain                 Louann Sjogren

## 2021-09-25 ENCOUNTER — Encounter (HOSPITAL_COMMUNITY): Payer: Self-pay | Admitting: Internal Medicine

## 2021-10-02 ENCOUNTER — Telehealth: Payer: Self-pay | Admitting: Pulmonary Disease

## 2021-10-02 NOTE — Telephone Encounter (Signed)
Received a fax from [employer] - asking if there are any contraindications that would prevent this patient from having a PFT.  I sent the form to Dr. Elsworth Soho to complete and sign when he comes into the office on 7/10.

## 2021-10-06 ENCOUNTER — Encounter: Payer: Medicare (Managed Care) | Admitting: Acute Care

## 2021-10-06 ENCOUNTER — Encounter: Payer: Self-pay | Admitting: Internal Medicine

## 2021-10-07 NOTE — Telephone Encounter (Signed)
Dr. Elsworth Soho signed form from [place] and said there are no contraindications for pulmonary function testing.  I sent to fax# [phone]

## 2021-10-08 ENCOUNTER — Ambulatory Visit (HOSPITAL_BASED_OUTPATIENT_CLINIC_OR_DEPARTMENT_OTHER)
Admission: RE | Admit: 2021-10-08 | Discharge: 2021-10-08 | Disposition: A | Discharge status: Home / Self Care | Payer: Medicare (Managed Care) | Source: Ambulatory Visit | Attending: Acute Care | Admitting: Acute Care

## 2021-10-08 DIAGNOSIS — Z122 Encounter for screening for malignant neoplasm of respiratory organs: Secondary | ICD-10-CM | POA: Diagnosis present

## 2021-10-08 DIAGNOSIS — Z87891 Personal history of nicotine dependence: Secondary | ICD-10-CM | POA: Diagnosis present

## 2021-10-09 ENCOUNTER — Other Ambulatory Visit: Payer: Self-pay | Admitting: Acute Care

## 2021-10-09 DIAGNOSIS — Z122 Encounter for screening for malignant neoplasm of respiratory organs: Secondary | ICD-10-CM

## 2021-10-09 DIAGNOSIS — Z87891 Personal history of nicotine dependence: Secondary | ICD-10-CM

## 2021-10-29 ENCOUNTER — Telehealth: Payer: Self-pay | Admitting: Pulmonary Disease

## 2021-10-29 MED ORDER — IPRATROPIUM-ALBUTEROL 0.5-2.5 (3) MG/3ML IN SOLN: RESPIRATORY_TRACT | 5 refills | Status: DC

## 2021-10-29 NOTE — Telephone Encounter (Signed)
Called and spoke with pt and verified medication that she needed to have refilled. Rx has been sent to preferred pharmacy. Nothing further needed.

## 2021-11-10 ENCOUNTER — Other Ambulatory Visit: Payer: Self-pay | Admitting: Pulmonary Disease

## 2021-11-10 DIAGNOSIS — J449 Chronic obstructive pulmonary disease, unspecified: Secondary | ICD-10-CM

## 2021-12-09 ENCOUNTER — Ambulatory Visit: Payer: Medicare (Managed Care) | Admitting: Obstetrics and Gynecology

## 2021-12-10 ENCOUNTER — Other Ambulatory Visit: Payer: Self-pay | Admitting: Adult Health

## 2021-12-11 ENCOUNTER — Ambulatory Visit: Payer: Medicare (Managed Care) | Admitting: Obstetrics and Gynecology

## 2021-12-15 ENCOUNTER — Telehealth: Payer: Self-pay | Admitting: Pulmonary Disease

## 2021-12-15 MED ORDER — AZITHROMYCIN 250 MG PO TABS
ORAL_TABLET | ORAL | 0 refills | Status: DC
Start: 2021-12-15 — End: 2022-04-13

## 2021-12-15 NOTE — Telephone Encounter (Signed)
Patient called to request some cough medicine.  She stated she has a cough with a little mucus coming up.  Please advise and call patient to discuss further.  CB# [phone]

## 2021-12-15 NOTE — Telephone Encounter (Signed)
Called and spoke with patient. She verbalized understanding. Zpak has been sent in for her. Nothing further needed at time of call.

## 2021-12-15 NOTE — Telephone Encounter (Signed)
Called and spoke with patient. She stated that she has had a productive cough for the past week. She has been coughing up thick green phlegm. She denied any body aches or fever. Also denied being around anyone who has been sick recently. Confirmed that she is still using her Trelegy daily as well as the Duoneb as needed.   She wanted to know if Dr. Elsworth Soho would send in something for her cough.   Pharmacy is [facility] on [address].   Dr. Elsworth Soho, can you please advise? Thanks!

## 2021-12-21 ENCOUNTER — Ambulatory Visit: Payer: Self-pay | Admitting: *Deleted

## 2021-12-21 NOTE — Telephone Encounter (Signed)
  Chief Complaint: weakness in bilateral legs gone now. Requesting appt  Symptoms: 2 days ago patient woke up and fell getting out of bed due to bilateral legs "not working" x 2. Able to get up and able to walk but reports she would like to see PCP.  Frequency: 2 days ago  Pertinent Negatives: Patient denies chest pain difficulty breathing. No fever. No weakness now . Denies any other neurological issues . Disposition: '[x]'$ ED /'[]'$ Urgent Care (no appt availability in office) / '[]'$ Appointment(In office/virtual)/ '[]'$  [facility]/ '[]'$ Home Care/ '[]'$ Refused Recommended Disposition /'[]'$ [place] [facility]/ '[]'$  Follow-up with PCP Additional Notes:   Recommended ED due to recent weakness. Patient report no weakness now. Does not want to go to ED would rather be seen by PCP. No available appt until Oct 23. Please advise. Requesting a call back today .    Reason for Disposition  [1] Weakness (i.e., paralysis, loss of muscle strength) of the face, arm / hand, or leg / foot on one side of the body AND [2] sudden onset AND [3] brief (now gone)  Answer Assessment - Initial Assessment Questions 1. SYMPTOM: "What is the main symptom you are concerned about?" (e.g., weakness, numbness)     Legs not working , fell and could not get up x 2  2. ONSET: "When did this start?" (minutes, hours, days; while sleeping)     2 days  3. LAST NORMAL: "When was the last time you (the patient) were normal (no symptoms)?"     2 days ago  4. PATTERN "Does this come and go, or has it been constant since it started?"  "Is it present now?"     Started sudden and went away sudden  5. CARDIAC SYMPTOMS: "Have you had any of the following symptoms: chest pain, difficulty breathing, palpitations?"     No  6. NEUROLOGIC SYMPTOMS: "Have you had any of the following symptoms: headache, dizziness, vision loss, double vision, changes in speech, unsteady on your feet?"     Unable to move legs x 2 days ago  7. OTHER SYMPTOMS: "Do  you have any other symptoms?"     No  8. PREGNANCY: "Is there any chance you are pregnant?" "When was your last menstrual period?"     na  Protocols used: Neurologic Deficit-A-AH

## 2021-12-24 ENCOUNTER — Ambulatory Visit: Payer: Self-pay | Admitting: *Deleted

## 2021-12-24 ENCOUNTER — Other Ambulatory Visit: Payer: Self-pay | Admitting: Family Medicine

## 2021-12-24 DIAGNOSIS — E669 Obesity, unspecified: Secondary | ICD-10-CM

## 2021-12-24 DIAGNOSIS — R7303 Prediabetes: Secondary | ICD-10-CM

## 2021-12-24 NOTE — Telephone Encounter (Signed)
FYI, patient states she has not experienced numbness since 9/22.

## 2021-12-24 NOTE — Telephone Encounter (Signed)
  Chief Complaint: Numbness both legs Symptoms: One episode of numbness of both legs last Friday, duration of one minute.States was getting out of bed in the AM "When legs went numb." Has not occurred since. No other symptoms. Frequency: 12/18/21,  Pertinent Negatives: Patient denies no numbness presently. No associated symptoms. Disposition: '[]'$ ED /'[]'$ Urgent Care (no appt availability in office) / '[x]'$ Appointment(In office/virtual)/ '[]'$  [facility]/ '[]'$ Home Care/ '[]'$ Refused Recommended Disposition /'[]'$ [facility]/ '[]'$  Follow-up with PCP Additional Notes:No availability within protocol timeframe.  Secured first available appt 01/27/22, placed on wait list. Advised ED if reoccurs. Pt verbalizes understanding.  Reason for Disposition  [1] Numbness or tingling on both sides of body AND [2] is a new symptom present > 24 hours    One episode  Answer Assessment - Initial Assessment Questions 1. SYMPTOM: "What is the main symptom you are concerned about?" (e.g., weakness, numbness)     Numbness both legs, lasted 2 minutes 2. ONSET: "When did this start?" (minutes, hours, days; while sleeping)     Friday 3. LAST NORMAL: "When was the last time you (the patient) were normal (no symptoms)?"     12/18/21 4. PATTERN "Does this come and go, or has it been constant since it started?"  "Is it present now?"     One episode 5. CARDIAC SYMPTOMS: "Have you had any of the following symptoms: chest pain, difficulty breathing, palpitations?"     No 6. NEUROLOGIC SYMPTOMS: "Have you had any of the following symptoms: headache, dizziness, vision loss, double vision, changes in speech, unsteady on your feet?"     No 7. OTHER SYMPTOMS: "Do you have any other symptoms?"     Head in fog Friday for one minute or so  Protocols used: Neurologic Deficit-A-AH

## 2022-01-04 ENCOUNTER — Other Ambulatory Visit: Payer: Self-pay | Admitting: Nurse Practitioner

## 2022-01-04 DIAGNOSIS — G2581 Restless legs syndrome: Secondary | ICD-10-CM

## 2022-01-20 ENCOUNTER — Ambulatory Visit (INDEPENDENT_AMBULATORY_CARE_PROVIDER_SITE_OTHER): Payer: Medicare (Managed Care) | Admitting: Obstetrics and Gynecology

## 2022-01-20 ENCOUNTER — Encounter: Payer: Self-pay | Admitting: Obstetrics and Gynecology

## 2022-01-20 VITALS — BP 116/75 | HR 89

## 2022-01-20 DIAGNOSIS — N8110 Cystocele, unspecified: Secondary | ICD-10-CM

## 2022-01-20 NOTE — Progress Notes (Signed)
[facility]   Subjective:     Chief Complaint:  No chief complaint on file.  History of Present Illness: Monica Scott is a 66 y.o. female with stage III pelvic organ prolapse who presents for a pessary check. She is using a size 3 ring with support pessary. Pessary has been working well, denies vaginal bleeding.   Past Medical History: Patient  has a past medical history of Arthritis, Asthma, Bladder disease, COPD (chronic obstructive pulmonary disease) ([place]), Depression, GERD (gastroesophageal reflux disease), Hyperlipidemia, Hypertension, Peptic ulcer, and Urinary retention.   Past Surgical History: She  has a past surgical history that includes Vaginal hysterectomy; Pilonidal cyst excision; Colonoscopy with propofol (N/A, 04/24/2018); Esophagogastroduodenoscopy (egd) with propofol (N/A, 04/24/2018); maloney dilation (N/A, 04/24/2018); biopsy (04/24/2018); polypectomy (04/24/2018); Colonoscopy with propofol (N/A, 09/21/2021); and polypectomy (09/21/2021).   Medications: She has a current medication list which includes the following prescription(s): albuterol, amlodipine, atorvastatin, azithromycin, bupropion, estradiol, trelegy ellipta, gabapentin, ibuprofen, ipratropium-albuterol, methocarbamol, olanzapine, olmesartan, omeprazole, and ozempic (1 mg/dose).   Allergies: Patient is allergic to tizanidine.   Social History: Patient  reports that she quit smoking about 11 years ago. Her smoking use included cigarettes. She has a 60.00 pack-year smoking history. She has never been exposed to tobacco smoke. She has never used smokeless tobacco. She reports that she does not drink alcohol and does not use drugs.      Objective:    Physical Exam: BP 116/75   Pulse 89  Gen: No apparent distress, A&O x 3.  Detailed Urogynecologic Evaluation:  Pelvic Exam: Normal external female genitalia; Bartholin's and Skene's glands normal in appearance; urethral meatus normal in  appearance, no urethral masses or discharge. The pessary was noted to be in place. It was removed and cleaned. Speculum exam revealed small abrasion at the anterior/ apex of the vagina which was not bleeding.  The pessary was replaced. It was comfortable to the patient and fit well.   POP-Q (01/27/21):    POP-Q   2                                            Aa   2                                           Ba   -4                                              C    5                                            Gh   2                                            Pb   8  tvl    -2.5                                            Ap   -2.5                                            Bp                                                  D          Assessment/Plan:    Assessment: Monica Scott is a 66 y.o. with stage III pelvic organ prolapse here for a pessary check.   Plan: - Continue #3 ring with support  - start vaginal estrogen 0.5g twice a week - She will follow-up in 3 months for a pessary check or sooner as needed.   Jaquita Folds, MD  Time spent: I spent 15 minutes dedicated to the care of this patient on the date of this encounter to include pre-visit review of records, face-to-face time with the patient  and post visit documentation.

## 2022-01-21 ENCOUNTER — Encounter (HOSPITAL_COMMUNITY): Payer: Self-pay | Admitting: Emergency Medicine

## 2022-01-21 ENCOUNTER — Emergency Department (HOSPITAL_COMMUNITY): Payer: Medicare (Managed Care)

## 2022-01-21 ENCOUNTER — Emergency Department (HOSPITAL_COMMUNITY)
Admission: EM | Admit: 2022-01-21 | Discharge: 2022-01-22 | Disposition: A | Discharge status: Home / Self Care | Payer: Medicare (Managed Care) | Attending: Emergency Medicine | Admitting: Emergency Medicine

## 2022-01-21 ENCOUNTER — Other Ambulatory Visit: Payer: Self-pay

## 2022-01-21 DIAGNOSIS — A419 Sepsis, unspecified organism: Secondary | ICD-10-CM | POA: Diagnosis not present

## 2022-01-21 DIAGNOSIS — J449 Chronic obstructive pulmonary disease, unspecified: Secondary | ICD-10-CM | POA: Diagnosis not present

## 2022-01-21 DIAGNOSIS — W01198A Fall on same level from slipping, tripping and stumbling with subsequent striking against other object, initial encounter: Secondary | ICD-10-CM | POA: Diagnosis not present

## 2022-01-21 DIAGNOSIS — Z20822 Contact with and (suspected) exposure to covid-19: Secondary | ICD-10-CM | POA: Insufficient documentation

## 2022-01-21 DIAGNOSIS — R42 Dizziness and giddiness: Secondary | ICD-10-CM | POA: Insufficient documentation

## 2022-01-21 DIAGNOSIS — Z7951 Long term (current) use of inhaled steroids: Secondary | ICD-10-CM | POA: Insufficient documentation

## 2022-01-21 DIAGNOSIS — E876 Hypokalemia: Secondary | ICD-10-CM

## 2022-01-21 DIAGNOSIS — Z794 Long term (current) use of insulin: Secondary | ICD-10-CM | POA: Diagnosis not present

## 2022-01-21 LAB — COMPREHENSIVE METABOLIC PANEL
ALT: 9 U/L (ref 0–44)
AST: 15 U/L (ref 15–41)
Albumin: 3.6 g/dL (ref 3.5–5.0)
Alkaline Phosphatase: 93 U/L (ref 38–126)
Anion gap: 18 — ABNORMAL HIGH (ref 5–15)
BUN: 6 mg/dL — ABNORMAL LOW (ref 8–23)
CO2: 29 mmol/L (ref 22–32)
Calcium: 9.2 mg/dL (ref 8.9–10.3)
Chloride: 90 mmol/L — ABNORMAL LOW (ref 98–111)
Creatinine, Ser: 0.75 mg/dL (ref 0.44–1.00)
GFR, Estimated: >60 mL/min (ref >60)
Glucose, Bld: 110 mg/dL — ABNORMAL HIGH (ref 70–99)
Potassium: 2.4 mmol/L — CL (ref 3.5–5.1)
Sodium: 137 mmol/L (ref 135–145)
Total Bilirubin: 0.6 mg/dL (ref 0.3–1.2)
Total Protein: 6.5 g/dL (ref 6.5–8.1)

## 2022-01-21 LAB — CBC WITH DIFFERENTIAL/PLATELET
Abs Immature Granulocytes: 0.03 10*3/uL (ref 0.00–0.07)
Basophils Absolute: 0.1 10*3/uL (ref 0.0–0.1)
Basophils Relative: 1 %
Eosinophils Absolute: 0 10*3/uL (ref 0.0–0.5)
Eosinophils Relative: 0 %
HCT: 37.5 % (ref 36.0–46.0)
Hemoglobin: 12.3 g/dL (ref 12.0–15.0)
Immature Granulocytes: 0 %
Lymphocytes Relative: 12 %
Lymphs Abs: 1.3 10*3/uL (ref 0.7–4.0)
MCH: 27.8 pg (ref 26.0–34.0)
MCHC: 32.8 g/dL (ref 30.0–36.0)
MCV: 84.7 fL (ref 80.0–100.0)
Monocytes Absolute: 1 10*3/uL (ref 0.1–1.0)
Monocytes Relative: 9 %
Neutro Abs: 8.4 10*3/uL — ABNORMAL HIGH (ref 1.7–7.7)
Neutrophils Relative %: 78 %
Platelets: 387 10*3/uL (ref 150–400)
RBC: 4.43 MIL/uL (ref 3.87–5.11)
RDW: 14 % (ref 11.5–15.5)
WBC: 10.7 10*3/uL — ABNORMAL HIGH (ref 4.0–10.5)
nRBC: 0 % (ref 0.0–0.2)

## 2022-01-21 LAB — PROTIME-INR
INR: 1.2 (ref 0.8–1.2)
Prothrombin Time: 14.7 seconds (ref 11.4–15.2)

## 2022-01-21 LAB — RESP PANEL BY RT-PCR (FLU A&B, COVID) ARPGX2
Influenza A by PCR: NEGATIVE
Influenza B by PCR: NEGATIVE
SARS Coronavirus 2 by RT PCR: NEGATIVE

## 2022-01-21 LAB — LACTIC ACID, PLASMA
Lactic Acid, Venous: 0.8 mmol/L (ref 0.5–1.9)
Lactic Acid, Venous: 0.9 mmol/L (ref 0.5–1.9)

## 2022-01-21 LAB — APTT: aPTT: 33 seconds (ref 24–36)

## 2022-01-21 MED ORDER — ACETAMINOPHEN 500 MG PO TABS
1000.0000 mg | ORAL_TABLET | Freq: Once | ORAL | Status: AC
  Administered 2022-01-21: 1000 mg via ORAL
  Filled 2022-01-21: qty 2

## 2022-01-21 MED ORDER — ALBUTEROL SULFATE 0.63 MG/3ML IN NEBU: 1.0000 | INHALATION_SOLUTION | Freq: Four times a day (QID) | RESPIRATORY_TRACT | 12 refills | Status: DC | PRN

## 2022-01-21 NOTE — ED Notes (Signed)
Date and time results received: 01/21/22 8:03 PM  (use smartphrase ".now" to insert current time)  Test: K+ 2.4 Critical Value: K+  Name of Provider Notified: Hildred Alamin, [place]  Orders Received? Or Actions Taken?: see chart

## 2022-01-21 NOTE — ED Triage Notes (Signed)
Pt BIBA from home. Pt c/o SHOB. Hx COPD. Pt was walking around the neighborhood and had near syncopal episode. Did not hit head.  85% RA on EMS arrival, diminished lung sounds. Improved to 94% [place] 4L NSR 130HR 130s sys 25 RR , improved to 17 170FSBG  18 L hand

## 2022-01-21 NOTE — Progress Notes (Signed)
n

## 2022-01-21 NOTE — ED Provider Triage Note (Signed)
Emergency Medicine Provider Triage Evaluation Note  Monica Scott , a 66 y.o. female  was evaluated in triage.  Pt complains of presyncopal event while walking today.  Denied any chest pain or new shortness of breath.  States has been happening intermittently for the last week.  Denies fever fevers at home, vomiting, nausea, abdominal pain, dysuria, hematuria, cough, headache, vision changes, numbness, changes in medicine.  Her grandson has been sick at home with a cough but denies any other sick exposures..  Review of Systems  Per HPI  Physical Exam  BP (!) 129/97 (BP Location: Left Arm)   Pulse (!) 114   Temp (!) 102.2 F (39 C) (Oral)   Resp (!) 22   SpO2 90%  Gen:   Awake, no distress   Resp:  Normal effort  MSK:   Moves extremities without difficulty  Other:  Diminished lung sounds.  Patient is tachycardic with regular rhythm.  Abdomen soft and nontender,  Medical Decision Making  Medically screening exam initiated at 5:29 PM.  Appropriate orders placed.  Monica Scott was informed that the remainder of the evaluation will be completed by another provider, this initial triage assessment does not replace that evaluation, and the importance of remaining in the ED until their evaluation is complete.  Tachycardic, febrile and tachypneic concerning for sepsis.  Patient needs x-ray.  Tylenol ordered.   Sherrill Raring, PA-C 01/21/22 1730

## 2022-01-22 ENCOUNTER — Emergency Department (HOSPITAL_COMMUNITY): Payer: Medicare (Managed Care)

## 2022-01-22 DIAGNOSIS — R42 Dizziness and giddiness: Secondary | ICD-10-CM | POA: Diagnosis not present

## 2022-01-22 LAB — MAGNESIUM: Magnesium: 1.8 mg/dL (ref 1.7–2.4)

## 2022-01-22 LAB — URINALYSIS, ROUTINE W REFLEX MICROSCOPIC
Bilirubin Urine: NEGATIVE
Glucose, UA: NEGATIVE mg/dL
Ketones, ur: NEGATIVE mg/dL
Nitrite: POSITIVE — AB
Protein, ur: NEGATIVE mg/dL
Specific Gravity, Urine: 1.04 — ABNORMAL HIGH (ref 1.005–1.030)
pH: 6 (ref 5.0–8.0)

## 2022-01-22 MED ORDER — POTASSIUM CHLORIDE CRYS ER 20 MEQ PO TBCR
40.0000 meq | EXTENDED_RELEASE_TABLET | Freq: Once | ORAL | Status: AC
  Administered 2022-01-22: 40 meq via ORAL
  Filled 2022-01-22: qty 2

## 2022-01-22 MED ORDER — MAGNESIUM SULFATE 2 GM/50ML IV SOLN
2.0000 g | Freq: Once | INTRAVENOUS | Status: AC
  Administered 2022-01-22: 2 g via INTRAVENOUS
  Filled 2022-01-22: qty 50

## 2022-01-22 MED ORDER — IOHEXOL 350 MG/ML SOLN
75.0000 mL | Freq: Once | INTRAVENOUS | Status: AC | PRN
  Administered 2022-01-22: 75 mL via INTRAVENOUS

## 2022-01-22 MED ORDER — SODIUM CHLORIDE 0.9 % IV SOLN
1.0000 g | Freq: Once | INTRAVENOUS | Status: AC
  Filled 2022-01-22: qty 10

## 2022-01-22 MED ORDER — POTASSIUM CHLORIDE CRYS ER 20 MEQ PO TBCR: 40.0000 meq | EXTENDED_RELEASE_TABLET | Freq: Every day | ORAL | 0 refills | Status: DC

## 2022-01-22 MED ORDER — CEPHALEXIN 500 MG PO CAPS: 500.0000 mg | ORAL_CAPSULE | Freq: Four times a day (QID) | ORAL | 0 refills | Status: DC

## 2022-01-22 MED ORDER — POTASSIUM CHLORIDE 10 MEQ/100ML IV SOLN
10.0000 meq | INTRAVENOUS | Status: AC
  Administered 2022-01-22 (×3): 10 meq via INTRAVENOUS
  Filled 2022-01-22 (×3): qty 100

## 2022-01-22 NOTE — ED Provider Notes (Signed)
  Physical Exam  BP 107/72   Pulse 83   Temp 98.6 F (37 C) (Oral)   Resp (!) 23   SpO2 97%   Physical Exam Vitals and nursing note reviewed.  Constitutional:      General: She is not in acute distress.    Appearance: She is well-developed.  HENT:     Head: Normocephalic and atraumatic.  Eyes:     Conjunctiva/sclera: Conjunctivae normal.  Cardiovascular:     Rate and Rhythm: Normal rate and regular rhythm.     Heart sounds: No murmur heard. Pulmonary:     Effort: Pulmonary effort is normal. No respiratory distress.     Breath sounds: Normal breath sounds.  Abdominal:     Palpations: Abdomen is soft.     Tenderness: There is no abdominal tenderness.  Musculoskeletal:        General: No swelling.     Cervical back: Neck supple.  Skin:    General: Skin is warm and dry.     Capillary Refill: Capillary refill takes less than 2 seconds.  Neurological:     Mental Status: She is alert and oriented to person, place, and time.  Psychiatric:        Mood and Affect: Mood normal.     Procedures  Procedures  ED Course / MDM   Clinical Course as of 01/22/22 0946  Fri Jan 22, 2022  0625 Consult to Dr. Lorrin Goodell, neurologist to agrees that most likely etiology of patient's presentation is hypokalemia.  Given it was systemic process, bilateral, and neurologic exam is completely nonfocal at this time, particularly with reassuring CTAs, doubt CVA at this time.  No indication for MRI.  I appreciate his collaboration in care of this patient. [RS]  0701 Episode of feeling like she would pass out, fell forward and was unable to move for 10 minutes. Bystandersh elped her, lasted 10-15 mins and resolved. Patient is hypokalemic, 2g of mag, 40 orally. Patient can be dc home as long as she is WT. Repeat neuro exam, ambulate, discharge. F/u with PCP for potassium recheck.  [CG]  [id number] Patient with UTI on UA. Will tx accordingly [CG]    Clinical Course User Index [CG] Azucena Cecil,  PA-C [RS] Sponseller, Gypsy Balsam, PA-C   Medical Decision Making Amount and/or Complexity of Data Reviewed Labs: ordered. Radiology: ordered.  Risk Prescription drug management.   66 year old female signed out to me at shift change.  Please see previous provider note for further details.  Patient signed out to me pending IV magnesium, potassium.  Patient also found to have UTI on urinalysis which was treated with 1 g of Rocephin.  Patient reports that she self catheterizes, denies any dysuria. The patient will be discharged home with Keflex and advised to follow-up with PCP.  Patient urine has been sent off for culture.  Patient will be advised to follow-up with PCP for potassium recheck.  Patient states that she has appointment with her PCP next week.  Patient given return precautions and she voiced understanding.  Patient had all of her questions answered to her satisfaction.  The patient stable at this time for discharge home.       Azucena Cecil, PA-C 01/22/22 5374    Varney Biles, MD 01/23/22 1436

## 2022-01-22 NOTE — ED Provider Notes (Signed)
[facility] EMERGENCY DEPARTMENT Provider Note   CSN: [id number] Arrival date & time: 01/21/22  1616     History  Chief Complaint  Patient presents with   Weakness    Monica Scott is a 66 y.o. female who presents via EMS after episode this evening.  Patient states that she was walking around her neighborhood and she began to feel very lightheaded.  He squatted down to avoid falling and after squatting, fell forward on her face on the gravel.  She states that for approximately 10 minutes she was stuck in that position, unable to move her arms legs or neck.  States that she could breathe and she was able to speak but could not force her body to move.  She was assisted from the ground to stretcher by EMS, patient reports she was completely flaccid.  Never stopped breathing, never required CPR.  Never had any unilateral numbness tingling or weakness, never had any chest pain blurry vision double vision or loss of vision.  Patient endorses complete resolution of symptoms approximately 10 to 15 minutes after onset.  Feels normal at this time.  Patient had similar episode a few weeks ago.  No history of same.  I personally read medical records previous history of COPD, pretension, GERD, peptic ulcer disease.  No history of CAD, echocardiogram with normal EF.  No anticoagulation.  HPI     Home Medications Prior to Admission medications   Medication Sig Start Date End Date Taking? Authorizing Provider  albuterol (ACCUNEB) 0.63 MG/3ML nebulizer solution Take 3 mLs (0.63 mg total) by nebulization every 6 (six) hours as needed for wheezing. 01/21/22   Rigoberto Noel, MD  albuterol (VENTOLIN HFA) 108 (90 Base) MCG/ACT inhaler INHALE 1-2 PUFFS EVERY 6 HOURS AS NEEDED FOR WHEEZE OR SHORTNESS OF BREATH 11/10/21   Rigoberto Noel, MD  amLODipine (NORVASC) 5 MG tablet Take 1 tablet (5 mg total) by mouth 2 (two) times daily. 09/07/21   Gildardo Pounds, NP  atorvastatin (LIPITOR) 20 MG  tablet Take 1 tablet (20 mg total) by mouth daily. 09/07/21   Gildardo Pounds, NP  azithromycin (ZITHROMAX) 250 MG tablet Take 2 tablets on first day then 1 tablet daily until finished. 12/15/21   Rigoberto Noel, MD  buPROPion (WELLBUTRIN XL) 150 MG 24 hr tablet Take 1 tablet (150 mg total) by mouth at bedtime. 09/07/21   Gildardo Pounds, NP  estradiol (ESTRACE) 0.1 MG/GM vaginal cream Place 0.5 g vaginally 2 (two) times a week. Place 0.5g nightly for two weeks then twice a week after 09/07/21   Jaquita Folds, MD  FLUoxetine (PROZAC) 20 MG capsule Take 60 mg by mouth every morning. 01/20/22   [provider]  Fluticasone-Umeclidin-Vilant (TRELEGY ELLIPTA) 100-62.5-25 MCG/ACT AEPB Inhale 1 puff into the lungs daily. 08/03/21   Rigoberto Noel, MD  gabapentin (NEURONTIN) 400 MG capsule Take 1 capsule (400 mg total) by mouth 3 (three) times daily. 09/07/21   Gildardo Pounds, NP  ibuprofen (ADVIL,MOTRIN) 200 MG tablet Take 400 mg by mouth daily as needed for moderate pain.    [provider]  methocarbamol (ROBAXIN) 500 MG tablet Take 1 tablet (500 mg total) by mouth 2 (two) times daily as needed for muscle spasms. 01/04/22   Charlott Rakes, MD  OLANZapine (ZYPREXA) 10 MG tablet Take 10 mg by mouth at bedtime.     [provider]  olmesartan (BENICAR) 40 MG tablet Take 1 tablet (40  mg total) by mouth daily. 09/07/21   Gildardo Pounds, NP  omeprazole (PRILOSEC) 40 MG capsule Take 1 capsule (40 mg total) by mouth in the morning and at bedtime. 09/07/21 12/06/21  Gildardo Pounds, NP  Semaglutide, 1 MG/DOSE, (OZEMPIC, 1 MG/DOSE,) 4 MG/3ML SOPN Inject 1 mg into the skin once a week. 12/24/21   Charlott Rakes, MD  traZODone (DESYREL) 100 MG tablet Take 100 mg by mouth at bedtime. 01/20/22   [provider]      Allergies    Tizanidine    Review of Systems   Review of Systems  Constitutional: Negative.   HENT: Negative.    Respiratory:  Negative for chest tightness  and shortness of breath.   Cardiovascular: Negative.   Gastrointestinal: Negative.   Neurological:  Positive for weakness.    Physical Exam Updated Vital Signs BP 133/77   Pulse 81   Temp 98.1 F (36.7 C) (Oral)   Resp (!) 23   SpO2 96%  Physical Exam Vitals and nursing note reviewed.  Constitutional:      Appearance: She is not ill-appearing or toxic-appearing.  HENT:     Head: Normocephalic and atraumatic.     Mouth/Throat:     Mouth: Mucous membranes are moist.     Pharynx: No oropharyngeal exudate or posterior oropharyngeal erythema.  Eyes:     General:        Right eye: No discharge.        Left eye: No discharge.     Extraocular Movements: Extraocular movements intact.     Conjunctiva/sclera: Conjunctivae normal.     Pupils: Pupils are equal, round, and reactive to light.  Cardiovascular:     Rate and Rhythm: Normal rate and regular rhythm.     Pulses: Normal pulses.     Heart sounds: Normal heart sounds. No murmur heard. Pulmonary:     Effort: Pulmonary effort is normal. No tachypnea, bradypnea, accessory muscle usage or respiratory distress.     Breath sounds: Normal breath sounds. No wheezing or rales.     Comments: On baseline 3 L supplemental oxygen by nasal cannula. Abdominal:     General: Bowel sounds are normal. There is no distension.     Palpations: Abdomen is soft.     Tenderness: There is no abdominal tenderness. There is no right CVA tenderness, left CVA tenderness, guarding or rebound.  Musculoskeletal:        General: No deformity.     Cervical back: Neck supple.     Right lower leg: No edema.     Left lower leg: No edema.  Skin:    General: Skin is warm and dry.     Capillary Refill: Capillary refill takes less than 2 seconds.  Neurological:     General: No focal deficit present.     Mental Status: She is alert and oriented to person, place, and time. Mental status is at baseline.     GCS: GCS eye subscore is 4. GCS verbal subscore is 5. GCS  motor subscore is 6.     Cranial Nerves: Cranial nerves 2-12 are intact.     Sensory: Sensation is intact.     Motor: Motor function is intact.     Coordination: Coordination is intact.  Psychiatric:        Mood and Affect: Mood normal.     ED Results / Procedures / Treatments   Labs (all labs ordered are listed, but only abnormal results are displayed) Labs  Reviewed  COMPREHENSIVE METABOLIC PANEL - Abnormal; Notable for the following components:      Result Value   Potassium 2.4 (*)    Chloride 90 (*)    Glucose, Bld 110 (*)    BUN 6 (*)    Anion gap 18 (*)    All other components within normal limits  CBC WITH DIFFERENTIAL/PLATELET - Abnormal; Notable for the following components:   WBC 10.7 (*)    Neutro Abs 8.4 (*)    All other components within normal limits  URINALYSIS, ROUTINE W REFLEX MICROSCOPIC - Abnormal; Notable for the following components:   APPearance HAZY (*)    Specific Gravity, Urine 1.040 (*)    Hgb urine dipstick SMALL (*)    Nitrite POSITIVE (*)    Leukocytes,Ua MODERATE (*)    Bacteria, UA MANY (*)    All other components within normal limits  RESP PANEL BY RT-PCR (FLU A&B, COVID) ARPGX2  CULTURE, BLOOD (ROUTINE X 2)  CULTURE, BLOOD (ROUTINE X 2)  URINE CULTURE  SARS CORONAVIRUS 2 BY RT PCR  LACTIC ACID, PLASMA  LACTIC ACID, PLASMA  PROTIME-INR  APTT  MAGNESIUM    EKG EKG Interpretation  Date/Time:  Thursday January 21 2022 17:08:08 EDT Ventricular Rate:  119 PR Interval:  124 QRS Duration: 78 QT Interval:  312 QTC Calculation: 438 R Axis:   78 Text Interpretation: Sinus tachycardia with Premature atrial complexes Otherwise normal ECG When compared with ECG of 04-Nov-2018 11:27, No significant change was found Confirmed by Delora Fuel ([id number]) on 01/22/2022 12:31:02 AM  Radiology CT ANGIO HEAD NECK W WO CM  Result Date: 01/22/2022 CLINICAL DATA:  66 year old female with an episode of temporary paralysis, presyncope. EXAM: CT  ANGIOGRAPHY HEAD AND NECK TECHNIQUE: Multidetector CT imaging of the head and neck was performed using the standard protocol during bolus administration of intravenous contrast. Multiplanar CT image reconstructions and MIPs were obtained to evaluate the vascular anatomy. Carotid stenosis measurements (when applicable) are obtained utilizing NASCET criteria, using the distal internal carotid diameter as the denominator. RADIATION DOSE REDUCTION: This exam was performed according to the departmental dose-optimization program which includes automated exposure control, adjustment of the mA and/or kV according to patient size and/or use of iterative reconstruction technique. CONTRAST:  56m OMNIPAQUE IOHEXOL 350 MG/ML SOLN COMPARISON:  Cervical spine CT today reported separately. Head CT 05/11/2013. FINDINGS: CT HEAD FINDINGS Brain: Cerebral volume loss since 2015 appears to be generalized. No midline shift, ventriculomegaly, mass effect, evidence of mass lesion, intracranial hemorrhage or evidence of cortically based acute infarction. Minimal to mild for age scattered subcortical white matter hypodensity. No cortical encephalomalacia identified. Vascular: Calcified atherosclerosis at the skull base. Skull: Chronic appearing TMJ degeneration. No acute osseous abnormality identified. Sinuses/Orbits: Visualized paranasal sinuses and mastoids are clear. Other: Visualized orbits and scalp soft tissues are within normal limits. CTA NECK Skeleton: Cervical spine CT detailed separately. Absent dentition.  Chronic appearing TMJ degeneration. Upper chest: Centrilobular emphysema. Upper lungs are clear. Visible main pulmonary artery is enhancing as expected. No superior mediastinal mass or lymphadenopathy. Other neck: No acute finding. Aortic arch: 3 vessel arch configuration with moderate soft and calcified arch and descending aorta atherosclerosis. Right carotid system: Tortuous brachiocephalic artery and proximal right CCA with  mild plaque and no stenosis. Retropharyngeal course of the right carotid to the bifurcation, and retropharyngeal course of the right ICA to the C2 level. Bulky calcified right CCA plaque proximal to the bifurcation but without stenosis on series 9, image  107. Less pronounced right ICA origin and bulb pack without stenosis. Left carotid system: Soft and calcified plaque at the left CCA origin with up to 68 % stenosis with respect to the distal vessel. See series 9, image 137. Tortuous and retropharyngeal left CCA through the carotid bifurcation and retropharyngeal left ICA. Bulky calcified plaque at the left ICA origin and bulb with stenosis numerically estimated at 63-67 % with respect to the distal vessel. The vessel remains patent and is tortuous just below the skull base. Vertebral arteries: Proximal right subclavian artery calcified plaque without stenosis. Normal right vertebral artery origin. Tortuous right vertebral artery is patent to the skull base with no plaque or stenosis. Proximal left subclavian artery soft and calcified plaque without stenosis. Early origin of the left vertebral artery in the superior mediastinum series 11, image 265 without stenosis. Fairly codominant left vertebral artery is tortuous and patent to the skull base without stenosis. CTA HEAD Posterior circulation: Codominant distal vertebral arteries are patent to the basilar without stenosis. Both PICA origins are normal, the left occurs early. Patent basilar artery without stenosis. Normal SCA and right PCA origins. Fetal type left PCA origin. Right posterior communicating artery diminutive or absent. Bilateral PCA branches are within normal limits. Anterior circulation: Both ICA siphons are patent with mild tortuosity and mild calcified plaque. No left siphon stenosis. Mild right siphon supraclinoid stenosis. Normal left posterior communicating artery origin. Patent carotid termini, MCA and ACA origins. Dominant right and diminutive  left ACA A1 segments. Normal anterior communicating artery. Bilateral ACA branches are within normal limits. Left MCA M1 segment and bifurcation are patent without stenosis. Right MCA M1 segment and trifurcation are patent without stenosis. Bilateral MCA branches are within normal limits. Venous sinuses: Patent. Anatomic variants: Dominant right ACA A1. Fetal type left PCA origin. Review of the MIP images confirms the above findings IMPRESSION: 1. Negative for large vessel occlusion. Positive for bulky atherosclerosis at the Left CCA and ICA origins with 65-68% stenosis at both. Note near complete retropharyngeal course of both carotids. Other atherosclerosis but no other significant stenosis in the head or neck. Aortic Atherosclerosis (ICD10-I70.0). 2. No acute intracranial abnormality. Negative for age CT appearance of the brain. 3.  Emphysema (ICD10-J43.9).  Cervical spine CT reported separately. Electronically Signed   By: Genevie Ann M.D.   On: 01/22/2022 05:31   CT C-SPINE NO CHARGE  Result Date: 01/22/2022 CLINICAL DATA:  66 year old female with an episode of temporary paralysis, presyncope. EXAM: CT CERVICAL SPINE WITH CONTRAST TECHNIQUE: Multiplanar CT images of the cervical spine were reconstructed from contemporary CTA of the Neck. RADIATION DOSE REDUCTION: This exam was performed according to the departmental dose-optimization program which includes automated exposure control, adjustment of the mA and/or kV according to patient size and/or use of iterative reconstruction technique. CONTRAST:  No additional COMPARISON:  CTA head and neck today reported separately. FINDINGS: Alignment: Reversal of cervical lordosis. Anterolisthesis of C3 on C4 measures 3 mm. Mild to moderate underlying dextroconvex cervical scoliosis. Cervicothoracic junction alignment is within normal limits. Bilateral posterior element alignment is within normal limits. Skull base and vertebrae: Visualized skull base is intact. No  atlanto-occipital dissociation. C1 and C2 appear intact and aligned. No acute osseous abnormality identified. Soft tissues and spinal canal: No prevertebral fluid or swelling. No visible canal hematoma. Extensive retropharyngeal course of both carotids from the superior mediastinum to the skull base. See other neck CTA findings reported separately. Disc levels: Widespread moderate to severe cervical spine degeneration. Degenerative  appearing anterolisthesis at C3-C4 with facet arthropathy greater on the left. Advanced disc and endplate degeneration there, C4-C5, C5-C6, C6-C7. Up to mild associated spinal stenosis. Upper chest: Emphysema.  See neck CTA reported separately. IMPRESSION: 1. No acute osseous abnormality in the cervical spine. 2. Advanced cervical spine degeneration in the setting of dextroconvex scoliosis and C3-C4 spondylolisthesis. Up to mild associated spinal stenosis. 3. 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 See Neck CTA reported separately. 4.  Emphysema (ICD10-J43.9). Electronically Signed   By: Genevie Ann M.D.   On: 01/22/2022 05:15   DG Chest 2 View  Result Date: 01/21/2022 CLINICAL DATA:  Short of breath, hypoxia, near syncope, COPD EXAM: CHEST - 2 VIEW COMPARISON:  11/09/2020 FINDINGS: Frontal and lateral views of the chest demonstrate an unremarkable cardiac silhouette. Subsegmental right middle lobe atelectasis. No acute airspace disease, effusion, or pneumothorax. Hyperinflation and background scarring consistent with history of emphysema. No acute bony abnormality. IMPRESSION: 1. No acute intrathoracic process. Electronically Signed   By: Randa Ngo M.D.   On: 01/21/2022 18:39    Procedures Procedures    Medications Ordered in ED Medications  magnesium sulfate IVPB 2 g 50 mL (2 g Intravenous New Bag/Given 01/22/22 0653)  cefTRIAXone (ROCEPHIN) 1 g in sodium chloride 0.9 % 100 mL IVPB (has no administration  in time range)  acetaminophen (TYLENOL) tablet 1,000 mg (1,000 mg Oral Given 01/21/22 1746)  potassium chloride 10 mEq in 100 mL IVPB (10 mEq Intravenous New Bag/Given 01/22/22 0333)  potassium chloride SA (KLOR-CON M) CR tablet 40 mEq (40 mEq Oral Given 01/22/22 0233)  iohexol (OMNIPAQUE) 350 MG/ML injection 75 mL (75 mLs Intravenous Contrast Given 01/22/22 0500)  potassium chloride SA (KLOR-CON M) CR tablet 40 mEq (40 mEq Oral Given 01/22/22 0654)    ED Course/ Medical Decision Making/ A&P Clinical Course as of 01/22/22 0750  Fri Jan 22, 2022  0625 Consult to Dr. Lorrin Goodell, neurologist to agrees that most likely etiology of patient's presentation is hypokalemia.  Given it was systemic process, bilateral, and neurologic exam is completely nonfocal at this time, particularly with reassuring CTAs, doubt CVA at this time.  No indication for MRI.  I appreciate his collaboration in care of this patient. [RS]  0701 Episode of feeling like she would pass out, fell forward and was unable to move for 10 minutes. Bystandersh elped her, lasted 10-15 mins and resolved. Patient is hypokalemic, 2g of mag, 40 orally. Patient can be dc home as long as she is WT. Repeat neuro exam, ambulate, discharge. F/u with PCP for potassium recheck.  [CG]  [id number] Patient with UTI on UA. Will tx accordingly [CG]    Clinical Course User Index [CG] Azucena Cecil, PA-C [RS] Maila Dukes, Gypsy Balsam, PA-C                           Medical Decision Making 66 year old female presents with concern for episode of generalized paralysis though able to speak in brief.  Hypertensive, tachycardic and tachypneic on intake,.  At time of my evaluation patient is afebrile following Tylenol, cardiopulmonary exam is normal, dental exam is benign and neurovascular exam is without focal deficit.  Patient is on her baseline 3 she is on oxygen l supplemental oxygen by nasal cannula.  DDx includes not limited to CVA, Guillain-Barr  syndrome, metabolic derangement, seizure-like activity, hemorrhage.  Amount and/or Complexity of Data Reviewed Labs: ordered.  Details:  CBC with leukocytosis of 10,000, no anemia.  CMP with hypokalemia of 2.4, elevated anion gap to 18 otherwise unremarkable.  COVID-negative, coags are normal.  Lactic acid is normal.  Radiology: ordered.  Risk Prescription drug management.   Case discussed with neurologist as above, favor etiology of hypokalemiA-induced muscle weakness/ paralysis.  Patient repleted with 80 mill equivalents p.o. potassium and 30 IV milliequivalents while in the ED.  At time of shift change care of this patient attended oncoming ED provider Astrid Drafts, PA-C.  Pertinent HPI, physical exam and laboratory findings were discussed with him prior to my partner.  Patient is pending completion of magnesium repletion and result of UA and then will likely be discharged home.  Home potassium repletion ordered.  Patient does have follow-up with her PCP on 01/27/2022, less than 1 week from now.  Is agreeable to requesting recheck of her potassium at that time.  Admission considered, however given patient is neurovascularly intact and hemodynamically stable, no indication for admission at this time.  Juliann Pulse voiced understanding of her medical evaluation and treatment plan. Each of their questions answered to their expressed satisfaction.    This chart was dictated using voice recognition software, Dragon. Despite the best efforts of this provider to proofread and correct errors, errors may still occur which can change documentation meaning.  Final Clinical Impression(s) / ED Diagnoses Final diagnoses:  None    Rx / DC Orders ED Discharge Orders     None         Aura Dials 01/22/22 0751    Merrily Pew, MD 01/23/22 [phone]

## 2022-01-22 NOTE — ED Notes (Signed)
[phone] matthew pt son would like a call back

## 2022-01-22 NOTE — Discharge Instructions (Signed)
Return to the ED with any new symptoms or concerns Please follow-up with your PCP next week for recheck of potassium Please begin taking Keflex antibiotic for UTI

## 2022-01-24 LAB — URINE CULTURE: Culture: >=100000 — AB

## 2022-01-25 ENCOUNTER — Telehealth (HOSPITAL_BASED_OUTPATIENT_CLINIC_OR_DEPARTMENT_OTHER): Payer: Self-pay

## 2022-01-25 NOTE — Telephone Encounter (Signed)
Post ED Visit - Positive Culture Follow-up  Culture report reviewed by antimicrobial stewardship pharmacist: [place] Team '[x]'$  Delilah Shan, [place], Pharm.D. '[]'$  Heide Guile, Pharm.D., BCPS AQ-ID '[]'$  Parks Neptune, Pharm.D., BCPS '[]'$  Alycia Rossetti, Pharm.D., BCPS '[]'$  [place], [place].D., BCPS, AAHIVP '[]'$  Legrand Como, Pharm.D., BCPS, AAHIVP '[]'$  Salome Arnt, PharmD, BCPS '[]'$  Johnnette Gourd, PharmD, BCPS '[]'$  Hughes Better, PharmD, BCPS '[]'$  Leeroy Cha, PharmD '[]'$  Laqueta Linden, PharmD, BCPS '[]'$  Albertina Parr, PharmD  [place] Team '[]'$  Leodis Sias, PharmD '[]'$  Lindell Spar, PharmD '[]'$  Royetta Asal, PharmD '[]'$  Graylin Shiver, Rph '[]'$  Rema Fendt) Monica Mac, PharmD '[]'$  Arlyn Dunning, PharmD '[]'$  Netta Cedars, PharmD '[]'$  Dia Sitter, PharmD '[]'$  Leone Haven, PharmD '[]'$  Gretta Arab, PharmD '[]'$  Theodis Shove, PharmD '[]'$  Peggyann Juba, PharmD '[]'$  Reuel Boom, PharmD   Positive urine culture Treated with Cephalexin, organism sensitive to the same and no further patient follow-up is required at this time.  Monica Scott 01/25/2022, 11:01 AM

## 2022-01-26 LAB — CULTURE, BLOOD (ROUTINE X 2)
Culture: NO GROWTH
Special Requests: ADEQUATE

## 2022-01-27 ENCOUNTER — Ambulatory Visit: Payer: Medicare (Managed Care) | Attending: Physician Assistant | Admitting: Physician Assistant

## 2022-01-27 ENCOUNTER — Encounter: Payer: Self-pay | Admitting: Physician Assistant

## 2022-01-27 VITALS — BP 125/82 | HR 88 | Ht 59.0 in | Wt 158.4 lb

## 2022-01-27 DIAGNOSIS — E876 Hypokalemia: Secondary | ICD-10-CM | POA: Diagnosis not present

## 2022-01-27 DIAGNOSIS — I6529 Occlusion and stenosis of unspecified carotid artery: Secondary | ICD-10-CM

## 2022-01-27 DIAGNOSIS — R7303 Prediabetes: Secondary | ICD-10-CM

## 2022-01-27 DIAGNOSIS — N3001 Acute cystitis with hematuria: Secondary | ICD-10-CM

## 2022-01-27 DIAGNOSIS — Z09 Encounter for follow-up examination after completed treatment for conditions other than malignant neoplasm: Secondary | ICD-10-CM

## 2022-01-27 DIAGNOSIS — R202 Paresthesia of skin: Secondary | ICD-10-CM

## 2022-01-27 DIAGNOSIS — I10 Essential (primary) hypertension: Secondary | ICD-10-CM

## 2022-01-27 LAB — CULTURE, BLOOD (ROUTINE X 2)
Culture: NO GROWTH
Special Requests: ADEQUATE

## 2022-01-27 MED ORDER — POTASSIUM CHLORIDE CRYS ER 20 MEQ PO TBCR: 20.0000 meq | EXTENDED_RELEASE_TABLET | Freq: Every day | ORAL | 3 refills | Status: DC

## 2022-01-27 NOTE — Progress Notes (Signed)
Patient ID: GRAVIELA NODAL, female   DOB: 1955/08/23, 66 y.o.   MRN: [id number]    Laura Caldas, is a 66 y.o. female  VQQ:[id number]  [id number]  DOB - 06-20-1955  Chief Complaint  Patient presents with   Numbness    In both legs        Subjective:   Monica Scott is a 66 y.o. female here today for a follow up visit After being seen at the ED 10/26 for weakness and near syncope with Potassium around 2 and UTI.  No longer feeling weak.  Admits to drinking too many coca colas and not enough water.    She had originally scheduled the appt bc about 1 month ago she awakened and was unable to move her lower legs from the knees down and they both felt numb.  This lasted for about 20 mins and has not happened again.    She wants to get back on ozempic  From ED note:  0625 Consult to Dr. Lorrin Goodell, neurologist to agrees that most likely etiology of patient's presentation is hypokalemia.  Given it was systemic process, bilateral, and neurologic exam is completely nonfocal at this time, particularly with reassuring CTAs, doubt CVA at this time.  No indication for MRI.  I appreciate his collaboration in care of this patient. [RS]  0701 Episode of feeling like she would pass out, fell forward and was unable to move for 10 minutes. Bystandersh elped her, lasted 10-15 mins and resolved. Patient is hypokalemic, 2g of mag, 40 orally. Patient can be dc home as long as she is WT. Repeat neuro exam, ambulate, discharge. F/u with PCP for potassium recheck.  [CG]  [id number] Patient with UTI on UA. Will tx accordingly [CG    From A/P: Patient signed out to me pending IV magnesium, potassium.  Patient also found to have UTI on urinalysis which was treated with 1 g of Rocephin.  Patient reports that she self catheterizes, denies any dysuria. The patient will be discharged home with Keflex and advised to follow-up with PCP.  Patient urine has been sent off for culture.   Patient will be advised to follow-up  with PCP for potassium recheck.  Patient states that she has appointment with her PCP next week.  IMPRESSION C-spine: 1. No acute osseous abnormality in the cervical spine. 2. Advanced cervical spine degeneration in the setting of dextroconvex scoliosis and C3-C4 spondylolisthesis. Up to mild associated spinal stenosis. 3. 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 See Neck CTA reported separately. 4.  Emphysema (ICD10-J43.9).  CT angio: 1. Negative for large vessel occlusion. Positive for bulky atherosclerosis at the Left CCA and ICA origins with 65-68% stenosis at both. Note near complete retropharyngeal course of both carotids.    No problems updated.  ALLERGIES: Allergies  Allergen Reactions   Tizanidine Other (See Comments)    Caused dizziness and dry mouth. Patient prefers not to take this again.    PAST MEDICAL HISTORY: Past Medical History:  Diagnosis Date   Arthritis    Asthma    Bladder disease    COPD (chronic obstructive pulmonary disease) ([place])    Depression    GERD (gastroesophageal reflux disease)    Hyperlipidemia    Hypertension    Peptic ulcer    Urinary retention     MEDICATIONS AT HOME: Prior to Admission medications   Medication Sig Start Date End Date Taking? Authorizing Provider  acetaminophen (TYLENOL) 325 MG  tablet Take 650 mg by mouth every 6 (six) hours as needed for mild pain.   Yes [provider]  albuterol (ACCUNEB) 0.63 MG/3ML nebulizer solution Take 3 mLs (0.63 mg total) by nebulization every 6 (six) hours as needed for wheezing. 01/21/22  Yes Rigoberto Noel, MD  albuterol (VENTOLIN HFA) 108 (90 Base) MCG/ACT inhaler INHALE 1-2 PUFFS EVERY 6 HOURS AS NEEDED FOR WHEEZE OR SHORTNESS OF BREATH Patient taking differently: Inhale 1 puff into the lungs daily as needed for wheezing or shortness of breath. 11/10/21  Yes Rigoberto Noel, MD  amLODipine (NORVASC) 5 MG  tablet Take 1 tablet (5 mg total) by mouth 2 (two) times daily. 09/07/21  Yes Gildardo Pounds, NP  atorvastatin (LIPITOR) 20 MG tablet Take 1 tablet (20 mg total) by mouth daily. 09/07/21  Yes Gildardo Pounds, NP  azithromycin (ZITHROMAX) 250 MG tablet Take 2 tablets on first day then 1 tablet daily until finished. 12/15/21  Yes Rigoberto Noel, MD  buPROPion (WELLBUTRIN XL) 150 MG 24 hr tablet Take 1 tablet (150 mg total) by mouth at bedtime. 09/07/21  Yes Gildardo Pounds, NP  cephALEXin (KEFLEX) 500 MG capsule Take 1 capsule (500 mg total) by mouth 4 (four) times daily. 01/22/22  Yes Azucena Cecil, PA-C  estradiol (ESTRACE) 0.1 MG/GM vaginal cream Place 0.5 g vaginally 2 (two) times a week. Place 0.5g nightly for two weeks then twice a week after 09/07/21  Yes Jaquita Folds, MD  FLUoxetine (PROZAC) 20 MG capsule Take 60 mg by mouth every morning. 01/20/22  Yes [provider]  Fluticasone-Umeclidin-Vilant (TRELEGY ELLIPTA) 100-62.5-25 MCG/ACT AEPB Inhale 1 puff into the lungs daily. 08/03/21  Yes Rigoberto Noel, MD  gabapentin (NEURONTIN) 400 MG capsule Take 1 capsule (400 mg total) by mouth 3 (three) times daily. Patient taking differently: Take 800 mg by mouth at bedtime. 09/07/21  Yes Gildardo Pounds, NP  ibuprofen (ADVIL,MOTRIN) 200 MG tablet Take 400 mg by mouth daily as needed for moderate pain.   Yes [provider]  methocarbamol (ROBAXIN) 500 MG tablet Take 1 tablet (500 mg total) by mouth 2 (two) times daily as needed for muscle spasms. 01/04/22  Yes Newlin, Enobong, MD  OLANZapine (ZYPREXA) 10 MG tablet Take 10 mg by mouth at bedtime.    Yes [provider]  olmesartan (BENICAR) 40 MG tablet Take 1 tablet (40 mg total) by mouth daily. 09/07/21  Yes Gildardo Pounds, NP  Semaglutide, 1 MG/DOSE, (OZEMPIC, 1 MG/DOSE,) 4 MG/3ML SOPN Inject 1 mg into the skin once a week. 12/24/21  Yes Charlott Rakes, MD  traZODone (DESYREL) 100 MG tablet Take 100 mg by mouth  at bedtime. 01/20/22  Yes [provider]  omeprazole (PRILOSEC) 40 MG capsule Take 1 capsule (40 mg total) by mouth in the morning and at bedtime. 09/07/21 01/22/22  Gildardo Pounds, NP  potassium chloride SA (KLOR-CON M) 20 MEQ tablet Take 1 tablet (20 mEq total) by mouth daily for 5 days. 01/27/22 02/01/22  Argentina Donovan, PA-C    ROS: Neg HEENT Neg resp Neg cardiac Neg GI Neg GU Neg MS Neg psych Neg neuro  Objective:   Vitals:   01/27/22 1059  BP: 125/82  Pulse: 88  SpO2: 91%  Weight: 158 lb 6.4 oz (71.8 kg)  Height: '4\' 11"'$  (1.499 m)   Exam General appearance : Awake, alert, not in any distress. Speech Clear. Not toxic looking HEENT: Atraumatic and Normocephalic Neck:  Supple, no JVD. No cervical lymphadenopathy.  Chest: Good air entry bilaterally, CTAB.  No rales/rhonchi/wheezing CVS: S1 S2 regular, no murmurs.  Extremities: B/L Lower Ext shows no edema, both legs are warm to touch Neurology: Awake alert, and oriented X 3, CN II-XII intact, Non focal Skin: No Rash  Data Review Lab Results  Component Value Date   HGBA1C 5.8 (H) 09/07/2021   HGBA1C 5.5 06/04/2020   HGBA1C 6.0 (H) 08/08/2019    Assessment & Plan   1. Prediabetes Stable. Deferred ozempic due to current electrolyte issues - Comprehensive metabolic panel - Ambulatory referral to Cardiology  2. Hypokalemia Take KCl 20 mEq daily(historically, she has had other low potassium levels) - Comprehensive metabolic panel - Ambulatory referral to Cardiology  3. Acute cystitis with hematuria resolved  4. Encounter for examination following treatment at hospital  5. Paresthesia - TSH - B12 and Folate Panel  6. Stenosis of carotid artery, unspecified laterality - Ambulatory referral to Cardiology  7. Essential hypertension Continue current regimen - Comprehensive metabolic panel - Ambulatory referral to Cardiology    Return in about 6 weeks (around 03/10/2022) for wants to see Zelda  for discussion about ozempic.  The patient was given clear instructions to go to ER or return to medical center if symptoms don't improve, worsen or new problems develop. The patient verbalized understanding. The patient was told to call to get lab results if they haven't heard anything in the next week.      Freeman Caldron, PA-C [facility] [place], [place]   01/27/2022, 11:28 AM

## 2022-01-28 LAB — COMPREHENSIVE METABOLIC PANEL
ALT: 16 IU/L (ref 0–32)
AST: 19 IU/L (ref 0–40)
Albumin/Globulin Ratio: 2.1 (ref 1.2–2.2)
Albumin: 4.2 g/dL (ref 3.9–4.9)
Alkaline Phosphatase: 100 IU/L (ref 44–121)
BUN/Creatinine Ratio: 4 — ABNORMAL LOW (ref 12–28)
BUN: 4 mg/dL — ABNORMAL LOW (ref 8–27)
Bilirubin Total: 0.2 mg/dL (ref 0.0–1.2)
CO2: 20 mmol/L (ref 20–29)
Calcium: 10 mg/dL (ref 8.7–10.3)
Chloride: 99 mmol/L (ref 96–106)
Creatinine, Ser: 0.9 mg/dL (ref 0.57–1.00)
Globulin, Total: 2 g/dL (ref 1.5–4.5)
Glucose: 125 mg/dL — ABNORMAL HIGH (ref 70–99)
Potassium: 4.6 mmol/L (ref 3.5–5.2)
Sodium: 137 mmol/L (ref 134–144)
Total Protein: 6.2 g/dL (ref 6.0–8.5)
eGFR: 71 mL/min/{1.73_m2} (ref >59)

## 2022-01-28 LAB — B12 AND FOLATE PANEL
Folate: 5.8 ng/mL (ref >3.0)
Vitamin B-12: 592 pg/mL (ref 232–1245)

## 2022-01-28 LAB — TSH: TSH: 2.98 u[IU]/mL (ref 0.450–4.500)

## 2022-02-03 ENCOUNTER — Ambulatory Visit: Payer: Self-pay | Admitting: *Deleted

## 2022-02-03 NOTE — Telephone Encounter (Signed)
Summary: Medication questions   Patient states that she was given potassium chloride SA (KLOR-CON M) 20 MEQ tablet to take for 5 days and wants to know what she needs to do with the remaining 25 tablets that were in the bottle. Please advise.      Called patient to review medication questions regarding potassium 20 mEq to take x 5 days. Patient received 30 tablets and would like to know what to do with remaining 25 tablets. Reviewed with patient to keep remaining 25 tablets until she f/u with labs and seeing PCP again if she needs to take additional doses at a later time. Patient verbalized understanding.     Reason for Disposition  Caller has medicine question only, adult not sick, AND triager answers question  Answer Assessment - Initial Assessment Questions 1. NAME of MEDICINE: "What medicine(s) are you calling about?"     Potassium 20 mEq  2. QUESTION: "What is your question?" (e.g., double dose of medicine, side effect)     Ordered to take for 5 days and would like to know what to do with remaining 25 tablets she received from pharmacy? 3. PRESCRIBER: "Who prescribed the medicine?" Reason: if prescribed by specialist, call should be referred to that group.     PCP 4. SYMPTOMS: "Do you have any symptoms?" If Yes, ask: "What symptoms are you having?"  "How bad are the symptoms (e.g., mild, moderate, severe)     No sx 5. PREGNANCY:  "Is there any chance that you are pregnant?" "When was your last menstrual period?"     na  Protocols used: Medication Question Call-A-AH

## 2022-02-08 ENCOUNTER — Other Ambulatory Visit: Payer: Self-pay | Admitting: Family Medicine

## 2022-02-08 DIAGNOSIS — G2581 Restless legs syndrome: Secondary | ICD-10-CM

## 2022-02-22 ENCOUNTER — Ambulatory Visit (INDEPENDENT_AMBULATORY_CARE_PROVIDER_SITE_OTHER): Payer: Medicare (Managed Care) | Admitting: Cardiology

## 2022-02-22 ENCOUNTER — Encounter (HOSPITAL_BASED_OUTPATIENT_CLINIC_OR_DEPARTMENT_OTHER): Payer: Self-pay | Admitting: Cardiology

## 2022-02-22 VITALS — BP 122/80 | HR 83 | Ht 59.0 in | Wt 160.8 lb

## 2022-02-22 DIAGNOSIS — I10 Essential (primary) hypertension: Secondary | ICD-10-CM

## 2022-02-22 DIAGNOSIS — Z7182 Exercise counseling: Secondary | ICD-10-CM

## 2022-02-22 DIAGNOSIS — I6523 Occlusion and stenosis of bilateral carotid arteries: Secondary | ICD-10-CM | POA: Diagnosis not present

## 2022-02-22 DIAGNOSIS — Z7189 Other specified counseling: Secondary | ICD-10-CM | POA: Diagnosis not present

## 2022-02-22 DIAGNOSIS — I700 Atherosclerosis of aorta: Secondary | ICD-10-CM

## 2022-02-22 DIAGNOSIS — Z713 Dietary counseling and surveillance: Secondary | ICD-10-CM

## 2022-02-22 DIAGNOSIS — E7800 Pure hypercholesterolemia, unspecified: Secondary | ICD-10-CM

## 2022-02-22 MED ORDER — ASPIRIN 81 MG PO TBEC: 81.0000 mg | DELAYED_RELEASE_TABLET | Freq: Every day | ORAL | 3 refills | Status: DC

## 2022-02-22 NOTE — Progress Notes (Signed)
Cardiology Office Note:    Date:  02/22/2022   ID:  Monica Scott, DOB 07-03-1955, MRN [id number]  PCP:  Monica Pounds, NP  Cardiologist:  Buford Dresser, MD  Referring MD: Monica Scott   CC: New patient evaluation for carotid stenosis and hypertension   History of Present Illness:    Monica Scott is a 66 y.o. female with a hx of carotid artery stenosis, hypertension, hyperlipidemia, prediabetes, hypokalemia, GERD, COPD, asthma, arthritis, and peptic ulcer, who is seen as a new consult at the request of Monica Donovan, PA-C for the evaluation and management of carotid stenosis and hypertension.  Seen at the ED 01/21/2022 for weakness and near syncope with Potassium around 2 also in the setting of UTI. Initially while walking in her neighborhood she began feeling very lightheaded. After squatting down to avoid falling, she fell forward on her face. For 10 minutes she was stuck in this position and unable to ambulate her arms, legs, or neck.  EKG showed sinus tachycardia at 119 bpm with premature atrial complexes. Her CTA was negative for large vessel occlusion, positive for bulky atherosclerosis at the Left CCA and ICA origins with 65-68% stenosis at both, note near complete retropharyngeal course of both carotids. It was thought she had developed hypokalemia-induced muscle weakness/ paralysis.  Patient repleted with 80 mill equivalents p.o. potassium and 30 IV milliequivalents while in the ED.   She was seen by Freeman Caldron, PA-C on 01/27/2022. About 1 month prior she had been unable to ambulate from below her knees with associated numbness. This was an isolated episode lasting 20 minutes. She had improvement in her weakness since visiting the ED. She was referred to cardiology for further evaluation of hypokalemia.  Cardiovascular risk factors: Prior clinical ASCVD: Carotid artery stenosis Comorbid conditions: Hypertension for at least 10 years. Usually it has  been easier to control with her medication. She denies prediabetes or diabetes. Hyperlipidemia - on atorvastatin for the past 4 months. Metabolic syndrome/Obesity: Highest adult weight 160 lbs (current weight). Chronic inflammatory conditions: None. Tobacco use history: Former smoker, she quit in 2012. Family history: Her brother had heart issues in his early 70's, died of colon cancer. Prior cardiac testing and/or incidental findings on other testing (ie coronary calcium): CTA showing bulky atherosclerosis at the Left CCA and ICA origins with 65-68% stenosis at both. Exercise level: "Very little." She denies any strenuous activities. She sometimes cares for her grandchildren. Current diet: Red meats, potatoes, breads. Very little fruits and vegetables. She frequently drinks coke, "a whole lot." She plans to quit drinking sodas and work on weight loss.  Of note, she has not been taking the potassium supplements as she was last given only 30 tablets.  About 10-15 years ago she had bleeding issues related to a peptic ulcer. She denies any further bleeding issues since then.  Regarding her COPD she is on oxygen full-time. She did not bring in her oxygen tank with her today as it is cumbersome.  She denies any palpitations, chest pain, or peripheral edema. No lightheadedness, headaches, syncope, orthopnea, or PND.  Past Medical History:  Diagnosis Date   Arthritis    Asthma    Bladder disease    COPD (chronic obstructive pulmonary disease) ([place])    Depression    GERD (gastroesophageal reflux disease)    Hyperlipidemia    Hypertension    Peptic ulcer    Urinary retention     Past Surgical History:  Procedure  Laterality Date   BIOPSY  04/24/2018   Procedure: BIOPSY;  Surgeon: Daneil Dolin, MD;  [facility];  Service: Endoscopy;;  esophagus   COLONOSCOPY WITH PROPOFOL N/A 04/24/2018   Procedure: COLONOSCOPY WITH PROPOFOL;  Surgeon: Daneil Dolin, MD;  [facility];  Service: Endoscopy;  Laterality: N/A;  9:00am   COLONOSCOPY WITH PROPOFOL N/A 09/21/2021   Procedure: COLONOSCOPY WITH PROPOFOL;  Surgeon: Daneil Dolin, MD;  [facility];  Service: Endoscopy;  Laterality: N/A;  2:15 / ASA 2   ESOPHAGOGASTRODUODENOSCOPY (EGD) WITH PROPOFOL N/A 04/24/2018   Procedure: ESOPHAGOGASTRODUODENOSCOPY (EGD) WITH PROPOFOL;  Surgeon: Daneil Dolin, MD;  [facility];  Service: Endoscopy;  Laterality: N/A;   MALONEY DILATION N/A 04/24/2018   Procedure: Venia Minks DILATION;  Surgeon: Daneil Dolin, MD;  [facility];  Service: Endoscopy;  Laterality: N/A;   PILONIDAL CYST EXCISION     age 26   POLYPECTOMY  04/24/2018   Procedure: POLYPECTOMY;  Surgeon: Daneil Dolin, MD;  [facility];  Service: Endoscopy;;  colon    POLYPECTOMY  09/21/2021   Procedure: POLYPECTOMY;  Surgeon: Daneil Dolin, MD;  [facility];  Service: Endoscopy;;   VAGINAL HYSTERECTOMY      Current Medications: Current Outpatient Medications on File Prior to Visit  Medication Sig   acetaminophen (TYLENOL) 325 MG tablet Take 650 mg by mouth every 6 (six) hours as needed for mild pain.   albuterol (ACCUNEB) 0.63 MG/3ML nebulizer solution Take 3 mLs (0.63 mg total) by nebulization every 6 (six) hours as needed for wheezing.   albuterol (VENTOLIN HFA) 108 (90 Base) MCG/ACT inhaler INHALE 1-2 PUFFS EVERY 6 HOURS AS NEEDED FOR WHEEZE OR SHORTNESS OF BREATH (Patient taking differently: Inhale 1 puff into the lungs daily as needed for wheezing or shortness of breath.)   amLODipine (NORVASC) 5 MG tablet Take 1 tablet (5 mg total) by mouth 2 (two) times daily.   atorvastatin (LIPITOR) 20 MG tablet Take 1 tablet (20 mg total) by mouth daily.   azithromycin (ZITHROMAX) 250 MG tablet Take 2 tablets on first day then 1 tablet daily until finished.   buPROPion (WELLBUTRIN XL) 150 MG 24 hr tablet Take 1 tablet (150 mg total) by mouth at bedtime.    cephALEXin (KEFLEX) 500 MG capsule Take 1 capsule (500 mg total) by mouth 4 (four) times daily.   estradiol (ESTRACE) 0.1 MG/GM vaginal cream Place 0.5 g vaginally 2 (two) times a week. Place 0.5g nightly for two weeks then twice a week after   FLUoxetine (PROZAC) 20 MG capsule Take 60 mg by mouth every morning.   Fluticasone-Umeclidin-Vilant (TRELEGY ELLIPTA) 100-62.5-25 MCG/ACT AEPB Inhale 1 puff into the lungs daily.   gabapentin (NEURONTIN) 400 MG capsule Take 1 capsule (400 mg total) by mouth 3 (three) times daily. (Patient taking differently: Take 800 mg by mouth at bedtime.)   ibuprofen (ADVIL,MOTRIN) 200 MG tablet Take 400 mg by mouth daily as needed for moderate pain.   methocarbamol (ROBAXIN) 500 MG tablet TAKE 1 TABLET (500 MG TOTAL) BY MOUTH 2 (TWO) TIMES DAILY AS NEEDED FOR MUSCLE SPASMS.   OLANZapine (ZYPREXA) 10 MG tablet Take 10 mg by mouth at bedtime.    olmesartan (BENICAR) 40 MG tablet Take 1 tablet (40 mg total) by mouth daily.   Semaglutide, 1 MG/DOSE, (OZEMPIC, 1 MG/DOSE,) 4 MG/3ML SOPN Inject 1 mg into the skin once a week.   traZODone (  DESYREL) 100 MG tablet Take 100 mg by mouth at bedtime.   omeprazole (PRILOSEC) 40 MG capsule Take 1 capsule (40 mg total) by mouth in the morning and at bedtime.   No current facility-administered medications on file prior to visit.     Allergies:   Tizanidine   Social History   Tobacco Use   Smoking status: Former    Packs/day: 1.50    Years: 40.00    Total pack years: 60.00    Types: Cigarettes    Quit date: 04/19/2010    Years since quitting: 11.8    Passive exposure: Never   Smokeless tobacco: Never  Vaping Use   Vaping Use: Never used  Substance Use Topics   Alcohol use: No    Comment: Former ETOH. none since 2000   Drug use: No    Family History: family history includes Alcohol abuse in her brother, daughter, father, mother, sister, and son; Arthritis in her mother; COPD in her maternal grandfather; Cancer in her  mother; Colon cancer in her brother; Depression in her daughter, father, mother, and sister; Early death in her mother; Heart disease in her maternal grandfather; Hypertension in her maternal grandfather. There is no history of Breast cancer.  ROS:   Please see the history of present illness.  Additional pertinent ROS: Constitutional: Negative for chills, fever, night sweats, unintentional weight loss  HENT: Negative for ear pain and hearing loss.   Eyes: Negative for loss of vision and eye pain.  Respiratory: Negative for cough, sputum, wheezing.    Cardiovascular: See HPI. Gastrointestinal: Negative for abdominal pain, melena, and hematochezia.  Genitourinary: Negative for dysuria and hematuria.  Musculoskeletal: Negative for falls and myalgias.  Skin: Negative for itching and rash.  Neurological: Negative for focal weakness, focal sensory changes and loss of consciousness.  Endo/Heme/Allergies: Does not bruise/bleed easily.     EKGs/Labs/Other Studies Reviewed:    The following studies were reviewed today:  CTA Head/Neck  01/22/2022: IMPRESSION: 1. Negative for large vessel occlusion. Positive for bulky atherosclerosis at the Left CCA and ICA origins with 65-68% stenosis at both. Note near complete retropharyngeal course of both carotids. Other atherosclerosis but no other significant stenosis in the head or neck. Aortic Atherosclerosis (ICD10-I70.0).   2. No acute intracranial abnormality. Negative for age CT appearance of the brain.   3.  Emphysema (ICD10-J43.9).  Cervical spine CT reported separately.  Echo  02/13/2019:  1. Left ventricular ejection fraction, by visual estimation, is 60 to  65%. The left ventricle has normal function. There is no left ventricular  hypertrophy.   2. Global right ventricle has normal systolic function.The right  ventricular size is normal. No increase in right ventricular wall  thickness.   3. Left atrial size was normal.   4. Right  atrial size was normal.   5. The mitral valve is normal in structure. No evidence of mitral valve  regurgitation.   6. The tricuspid valve is normal in structure. Tricuspid valve  regurgitation is not demonstrated.   7. The aortic valve is normal in structure. Aortic valve regurgitation is  not visualized. No evidence of aortic valve sclerosis or stenosis.   8. The pulmonic valve was not well visualized. Pulmonic valve  regurgitation is not visualized.   9. Presence of pericardial fat pad.  10. There is mild dilatation of the ascending aorta measuring 37 mm.  11. The inferior vena cava is normal in size with greater than 50%  respiratory variability, suggesting right atrial  pressure of 3 mmHg.  12. TR signal is inadequate for assessing pulmonary artery systolic  pressure.    EKG:  EKG is personally reviewed.   02/22/2022: NSR at 83 bpm  Recent Labs: 01/21/2022: Hemoglobin 12.3; Platelets 387 01/22/2022: Magnesium 1.8 01/27/2022: ALT 16; BUN 4; Creatinine, Ser 0.90; Potassium 4.6; Sodium 137; TSH 2.980   Recent Lipid Panel    Component Value Date/Time   CHOL 196 08/15/2020 1011   TRIG 246 (H) 08/15/2020 1011   HDL 50 08/15/2020 1011   CHOLHDL 3.9 08/15/2020 1011   CHOLHDL 3.9 10/18/2017 0602   VLDL 21 10/18/2017 0602   LDLCALC 104 (H) 08/15/2020 1011    Physical Exam:    VS:  BP 122/80   Pulse 83   Ht '4\' 11"'$  (1.499 m)   Wt 160 lb 12.8 oz (72.9 kg)   SpO2 90%   BMI 32.48 kg/m     Wt Readings from Last 3 Encounters:  02/22/22 160 lb 12.8 oz (72.9 kg)  01/27/22 158 lb 6.4 oz (71.8 kg)  10/08/21 165 lb (74.8 kg)    GEN: Well nourished, well developed in no acute distress HEENT: Normal, moist mucous membranes NECK: No JVD CARDIAC: regular rhythm, normal S1 and S2, no rubs or gallops. No murmur. VASCULAR: Radial and DP pulses 2+ bilaterally. No carotid bruits RESPIRATORY:  Distant but clear to auscultation without rales, wheezing or rhonchi; On room air today.   ABDOMEN: Soft, non-tender, non-distended MUSCULOSKELETAL:  Ambulates independently SKIN: Warm and dry, no edema NEUROLOGIC:  Alert and oriented x 3. No focal neuro deficits noted. PSYCHIATRIC:  Normal affect    ASSESSMENT:    1. Bilateral carotid artery stenosis   2. Hypertension, essential, benign   3. Aortic atherosclerosis (HCC)   4. Cardiac risk counseling   5. Counseling on health promotion and disease prevention   6. Pure hypercholesterolemia   7. Exercise counseling   8. Nutritional counseling    PLAN:    Bilateral carotid stenosis Aortic atherosclerosis Hypercholesterolemia -reviewed CT report -discussed cholesterol at length today -after discussion, continue atorvastatin, recheck lipids, start aspirin 81 mg daily -counseled on red flag warning signs that need immediate medical attention -reviewed diet and exercise recommendations at length  Hypertension -BP improved today -continued amlodipine, olmesartan  Cardiac risk counseling and prevention recommendations: -recommend heart healthy/Mediterranean diet, with whole grains, fruits, vegetable, fish, lean meats, nuts, and olive oil. Limit salt. -recommend moderate walking, 3-5 times/week for 30-50 minutes each session. Aim for at least 150 minutes.week. Goal should be pace of 3 miles/hours, or walking 1.5 miles in 30 minutes -recommend avoidance of tobacco products. Avoid excess alcohol.   Plan for follow up: 1 year or sooner as needed.  Buford Dresser, MD, PhD, [facility]    Medication Adjustments/Labs and Tests Ordered: Current medicines are reviewed at length with the patient today.  Concerns regarding medicines are outlined above.   Orders Placed This Encounter  Procedures   Lipid Panel With LDL/HDL Ratio   EKG 12-Lead   Meds ordered this encounter  Medications   aspirin EC 81 MG tablet    Sig: Take 1 tablet (81 mg total) by mouth daily. Swallow whole.    Dispense:  90  tablet    Refill:  3   Patient Instructions  Medication Instructions:  The current medical regimen is effective;  continue present plan and medications.   *If you need a refill on your cardiac medications before your next appointment, please  call your pharmacy*   Lab Work: Lipid panel today  If you have labs (blood work) drawn today and your tests are completely normal, you will receive your results only by: [place] (if you have MyChart) OR A paper copy in the mail If you have any lab test that is abnormal or we need to change your treatment, we will call you to review the results.   Testing/Procedures: None   Follow-Up: At [facility], you and your health needs are our priority.  As part of our continuing mission to provide you with exceptional heart care, we have created designated Provider Care Teams.  These Care Teams include your primary Cardiologist (physician) and Advanced Practice Providers (APPs -  Physician Assistants and Nurse Practitioners) who all work together to provide you with the care you need, when you need it.  We recommend signing up for the patient portal called "MyChart".  Sign up information is provided on this After Visit Summary.  MyChart is used to connect with patients for Virtual Visits (Telemedicine).  Patients are able to view lab/test results, encounter notes, upcoming appointments, etc.  Non-urgent messages can be sent to your provider as well.   To learn more about what you can do with MyChart, go to [email or web address].    Your next appointment:   12 month(s)  The format for your next appointment:   In Person  Provider:   Buford Dresser, MD    Other Instructions None         I,Mathew Stumpf,acting as a scribe for Buford Dresser, MD.,have documented all relevant documentation on the behalf of Buford Dresser, MD,as directed by  Buford Dresser, MD while in the presence of Buford Dresser, MD.  I, Buford Dresser, MD, have reviewed all documentation for this visit. The documentation on 02/22/22 for the exam, diagnosis, procedures, and orders are all accurate and complete.   Signed, Buford Dresser, MD PhD 02/22/2022 1:10 PM    [facility]

## 2022-02-22 NOTE — Patient Instructions (Signed)
Medication Instructions:  The current medical regimen is effective;  continue present plan and medications.   *If you need a refill on your cardiac medications before your next appointment, please call your pharmacy*   Lab Work: Lipid panel today  If you have labs (blood work) drawn today and your tests are completely normal, you will receive your results only by: [place] (if you have MyChart) OR A paper copy in the mail If you have any lab test that is abnormal or we need to change your treatment, we will call you to review the results.   Testing/Procedures: None   Follow-Up: At [facility], you and your health needs are our priority.  As part of our continuing mission to provide you with exceptional heart care, we have created designated Provider Care Teams.  These Care Teams include your primary Cardiologist (physician) and Advanced Practice Providers (APPs -  Physician Assistants and Nurse Practitioners) who all work together to provide you with the care you need, when you need it.  We recommend signing up for the patient portal called "MyChart".  Sign up information is provided on this After Visit Summary.  MyChart is used to connect with patients for Virtual Visits (Telemedicine).  Patients are able to view lab/test results, encounter notes, upcoming appointments, etc.  Non-urgent messages can be sent to your provider as well.   To learn more about what you can do with MyChart, go to [email or web address].    Your next appointment:   12 month(s)  The format for your next appointment:   In Person  Provider:   Buford Dresser, MD    Other Instructions None

## 2022-02-23 LAB — LIPID PANEL WITH LDL/HDL RATIO
Cholesterol, Total: 260 mg/dL — ABNORMAL HIGH (ref 100–199)
HDL: 56 mg/dL (ref >39)
LDL Chol Calc (NIH): 160 mg/dL — ABNORMAL HIGH (ref 0–99)
LDL/HDL Ratio: 2.9 ratio (ref 0.0–3.2)
Triglycerides: 240 mg/dL — ABNORMAL HIGH (ref 0–149)
VLDL Cholesterol Cal: 44 mg/dL — ABNORMAL HIGH (ref 5–40)

## 2022-03-08 ENCOUNTER — Ambulatory Visit: Payer: Medicare (Managed Care) | Attending: Nurse Practitioner

## 2022-03-08 DIAGNOSIS — Z0000 Encounter for general adult medical examination without abnormal findings: Secondary | ICD-10-CM

## 2022-03-08 DIAGNOSIS — E2839 Other primary ovarian failure: Secondary | ICD-10-CM

## 2022-03-08 NOTE — Patient Instructions (Addendum)
Monica Scott , Thank you for taking time to come for your Medicare Wellness Visit. I appreciate your ongoing commitment to your health goals. Please review the following plan we discussed and let me know if I can assist you in the future.   These are the goals we discussed:  Goals   None     This is a list of the screening recommended for you and due dates:  Health Maintenance  Topic Date Due   DEXA scan (bone density measurement)  Never done   Zoster (Shingles) Vaccine (2 of 2) 11/02/2021   COVID-19 Vaccine (4 - 2023-24 season) 11/27/2021   Medicare Annual Wellness Visit  01/07/2022   Screening for Lung Cancer  10/09/2022   Mammogram  08/14/2023   DTaP/Tdap/Td vaccine (2 - Td or Tdap) 01/08/2031   Colon Cancer Screening  09/22/2031   Pneumonia Vaccine  Completed   Flu Shot  Completed   Hepatitis C Screening: USPSTF Recommendation to screen - Ages 18-79 yo.  Completed   HPV Vaccine  Aged Out   Health Maintenance After Age 67 After age 28, you are at a higher risk for certain long-term diseases and infections as well as injuries from falls. Falls are a major cause of broken bones and head injuries in people who are older than age 41. Getting regular preventive care can help to keep you healthy and well. Preventive care includes getting regular testing and making lifestyle changes as recommended by your health care provider. Talk with your health care provider about: Which screenings and tests you should have. A screening is a test that checks for a disease when you have no symptoms. A diet and exercise plan that is right for you. What should I know about screenings and tests to prevent falls? Screening and testing are the best ways to find a health problem early. Early diagnosis and treatment give you the best chance of managing medical conditions that are common after age 94. Certain conditions and lifestyle choices may make you more likely to have a fall. Your health care provider may  recommend: Regular vision checks. Poor vision and conditions such as cataracts can make you more likely to have a fall. If you wear glasses, make sure to get your prescription updated if your vision changes. Medicine review. Work with your health care provider to regularly review all of the medicines you are taking, including over-the-counter medicines. Ask your health care provider about any side effects that may make you more likely to have a fall. Tell your health care provider if any medicines that you take make you feel dizzy or sleepy. Strength and balance checks. Your health care provider may recommend certain tests to check your strength and balance while standing, walking, or changing positions. Foot health exam. Foot pain and numbness, as well as not wearing proper footwear, can make you more likely to have a fall. Screenings, including: Osteoporosis screening. Osteoporosis is a condition that causes the bones to get weaker and break more easily. Blood pressure screening. Blood pressure changes and medicines to control blood pressure can make you feel dizzy. Depression screening. You may be more likely to have a fall if you have a fear of falling, feel depressed, or feel unable to do activities that you used to do. Alcohol use screening. Using too much alcohol can affect your balance and may make you more likely to have a fall. Follow these instructions at home: Lifestyle Do not drink alcohol if: Your health care provider  tells you not to drink. If you drink alcohol: Limit how much you have to: 0-1 drink a day for women. 0-2 drinks a day for men. Know how much alcohol is in your drink. In the [place] one drink equals one 12 oz bottle of beer (355 mL), one 5 oz glass of wine (148 mL), or one 1 oz glass of hard liquor (44 mL). Do not use any products that contain nicotine or tobacco. These products include cigarettes, chewing tobacco, and vaping devices, such as e-cigarettes. If you need  help quitting, ask your health care provider. Activity  Follow a regular exercise program to stay fit. This will help you maintain your balance. Ask your health care provider what types of exercise are appropriate for you. If you need a cane or walker, use it as recommended by your health care provider. Wear supportive shoes that have nonskid soles. Safety  Remove any tripping hazards, such as rugs, cords, and clutter. Install safety equipment such as grab bars in bathrooms and safety rails on stairs. Keep rooms and walkways well-lit. General instructions Talk with your health care provider about your risks for falling. Tell your health care provider if: You fall. Be sure to tell your health care provider about all falls, even ones that seem minor. You feel dizzy, tiredness (fatigue), or off-balance. Take over-the-counter and prescription medicines only as told by your health care provider. These include supplements. Eat a healthy diet and maintain a healthy weight. A healthy diet includes low-fat dairy products, low-fat (lean) meats, and fiber from whole grains, beans, and lots of fruits and vegetables. Stay current with your vaccines. Schedule regular health, dental, and eye exams. Summary Having a healthy lifestyle and getting preventive care can help to protect your health and wellness after age 38. Screening and testing are the best way to find a health problem early and help you avoid having a fall. Early diagnosis and treatment give you the best chance for managing medical conditions that are more common for people who are older than age 36. Falls are a major cause of broken bones and head injuries in people who are older than age 8. Take precautions to prevent a fall at home. Work with your health care provider to learn what changes you can make to improve your health and wellness and to prevent falls. This information is not intended to replace advice given to you by your health  care provider. Make sure you discuss any questions you have with your health care provider. Document Revised: 08/04/2020 Document Reviewed: 08/04/2020 Elsevier Patient Education  [place].

## 2022-03-08 NOTE — Progress Notes (Signed)
Subjective:   Monica Scott is a 66 y.o. female who presents for Medicare Annual (Subsequent) preventive examination.  Review of Systems     I connected with Monica Scott on 03/08/2022 at 10:03 am by telephone and verified that I am speaking with the correct person using two identifiers. I discussed the limitations, risks, security and privacy concerns of performing an evaluation and management service by telephone and the availability of in person appointments. I also discussed with the patient that there may be a patient responsible charge related to this service. The patient expressed understanding and agreed to proceed.   Patient location: Home My Location: [place] on the telephone call: Myself and Monica Scott     Cardiac Risk Factors include: none     Objective:    There were no vitals filed for this visit. There is no height or weight on file to calculate BMI.     03/08/2022   10:07 AM 01/21/2022    5:14 PM 09/21/2021   12:13 PM 08/12/2021   10:29 AM 07/16/2021    6:47 AM 07/11/2021    8:09 AM 01/07/2021    4:10 PM  Advanced Directives  Does Patient Have a Medical Advance Directive? No No No No No No No  Would patient like information on creating a medical advance directive? Yes (ED - Information included in AVS)  No - Patient declined No - Patient declined No - Patient declined No - Patient declined Yes (ED - Information included in AVS)    Current Medications (verified) Outpatient Encounter Medications as of 03/08/2022  Medication Sig   acetaminophen (TYLENOL) 325 MG tablet Take 650 mg by mouth every 6 (six) hours as needed for mild pain.   albuterol (ACCUNEB) 0.63 MG/3ML nebulizer solution Take 3 mLs (0.63 mg total) by nebulization every 6 (six) hours as needed for wheezing.   albuterol (VENTOLIN HFA) 108 (90 Base) MCG/ACT inhaler INHALE 1-2 PUFFS EVERY 6 HOURS AS NEEDED FOR WHEEZE OR SHORTNESS OF BREATH (Patient taking differently: Inhale  1 puff into the lungs daily as needed for wheezing or shortness of breath.)   amLODipine (NORVASC) 5 MG tablet Take 1 tablet (5 mg total) by mouth 2 (two) times daily.   aspirin EC 81 MG tablet Take 1 tablet (81 mg total) by mouth daily. Swallow whole.   atorvastatin (LIPITOR) 20 MG tablet Take 1 tablet (20 mg total) by mouth daily.   buPROPion (WELLBUTRIN XL) 150 MG 24 hr tablet Take 1 tablet (150 mg total) by mouth at bedtime.   estradiol (ESTRACE) 0.1 MG/GM vaginal cream Place 0.5 g vaginally 2 (two) times a week. Place 0.5g nightly for two weeks then twice a week after   Fluticasone-Umeclidin-Vilant (TRELEGY ELLIPTA) 100-62.5-25 MCG/ACT AEPB Inhale 1 puff into the lungs daily.   gabapentin (NEURONTIN) 400 MG capsule Take 1 capsule (400 mg total) by mouth 3 (three) times daily. (Patient taking differently: Take 800 mg by mouth at bedtime.)   ibuprofen (ADVIL,MOTRIN) 200 MG tablet Take 400 mg by mouth daily as needed for moderate pain.   methocarbamol (ROBAXIN) 500 MG tablet TAKE 1 TABLET (500 MG TOTAL) BY MOUTH 2 (TWO) TIMES DAILY AS NEEDED FOR MUSCLE SPASMS.   OLANZapine (ZYPREXA) 10 MG tablet Take 10 mg by mouth at bedtime.    olmesartan (BENICAR) 40 MG tablet Take 1 tablet (40 mg total) by mouth daily.   Semaglutide, 1 MG/DOSE, (OZEMPIC, 1 MG/DOSE,) 4 MG/3ML SOPN Inject 1 mg into the skin  once a week.   azithromycin (ZITHROMAX) 250 MG tablet Take 2 tablets on first day then 1 tablet daily until finished. (Patient not taking: Reported on 03/08/2022)   cephALEXin (KEFLEX) 500 MG capsule Take 1 capsule (500 mg total) by mouth 4 (four) times daily. (Patient not taking: Reported on 03/08/2022)   FLUoxetine (PROZAC) 20 MG capsule Take 60 mg by mouth every morning. (Patient not taking: Reported on 03/08/2022)   omeprazole (PRILOSEC) 40 MG capsule Take 1 capsule (40 mg total) by mouth in the morning and at bedtime.   traZODone (DESYREL) 100 MG tablet Take 100 mg by mouth at bedtime. (Patient not  taking: Reported on 03/08/2022)   No facility-administered encounter medications on file as of 03/08/2022.    Allergies (verified) Tizanidine   History: Past Medical History:  Diagnosis Date   Arthritis    Asthma    Bladder disease    COPD (chronic obstructive pulmonary disease) ([place])    Depression    GERD (gastroesophageal reflux disease)    Hyperlipidemia    Hypertension    Peptic ulcer    Urinary retention    Past Surgical History:  Procedure Laterality Date   BIOPSY  04/24/2018   Procedure: BIOPSY;  Surgeon: Daneil Dolin, MD;  [facility];  Service: Endoscopy;;  esophagus   COLONOSCOPY WITH PROPOFOL N/A 04/24/2018   Procedure: COLONOSCOPY WITH PROPOFOL;  Surgeon: Daneil Dolin, MD;  [facility];  Service: Endoscopy;  Laterality: N/A;  9:00am   COLONOSCOPY WITH PROPOFOL N/A 09/21/2021   Procedure: COLONOSCOPY WITH PROPOFOL;  Surgeon: Daneil Dolin, MD;  [facility];  Service: Endoscopy;  Laterality: N/A;  2:15 / ASA 2   ESOPHAGOGASTRODUODENOSCOPY (EGD) WITH PROPOFOL N/A 04/24/2018   Procedure: ESOPHAGOGASTRODUODENOSCOPY (EGD) WITH PROPOFOL;  Surgeon: Daneil Dolin, MD;  [facility];  Service: Endoscopy;  Laterality: N/A;   MALONEY DILATION N/A 04/24/2018   Procedure: Venia Minks DILATION;  Surgeon: Daneil Dolin, MD;  [facility];  Service: Endoscopy;  Laterality: N/A;   PILONIDAL CYST EXCISION     age 22   POLYPECTOMY  04/24/2018   Procedure: POLYPECTOMY;  Surgeon: Daneil Dolin, MD;  [facility];  Service: Endoscopy;;  colon    POLYPECTOMY  09/21/2021   Procedure: POLYPECTOMY;  Surgeon: Daneil Dolin, MD;  [facility];  Service: Endoscopy;;   VAGINAL HYSTERECTOMY     Family History  Problem Relation Age of Onset   Alcohol abuse Mother    Arthritis Mother    Cancer Mother    Depression Mother    Early death Mother    Alcohol abuse Father    Depression Father    Alcohol  abuse Sister    Depression Sister    Alcohol abuse Daughter    Depression Daughter    COPD Maternal Grandfather    Heart disease Maternal Grandfather    Hypertension Maternal Grandfather    Alcohol abuse Brother    Colon cancer Brother        deceased at age 22   Alcohol abuse Son    Breast cancer Neg Hx    Social History   Socioeconomic History   Marital status: Single    Spouse name: Not on file   Number of children: Not on file   Years of education: Not on file   Highest education level: Not on file  Occupational History   Not on file  Tobacco Use  Smoking status: Former    Packs/day: 1.50    Years: 40.00    Total pack years: 60.00    Types: Cigarettes    Quit date: 04/19/2010    Years since quitting: 11.8    Passive exposure: Never   Smokeless tobacco: Never  Vaping Use   Vaping Use: Never used  Substance and Sexual Activity   Alcohol use: No    Comment: Former ETOH. none since 2000   Drug use: No   Sexual activity: Not Currently    Birth control/protection: Surgical  Other Topics Concern   Not on file  Social History Narrative   Not on file   Social Determinants of Health   Financial Resource Strain: Low Risk  (03/08/2022)   Overall Financial Resource Strain (CARDIA)    Difficulty of Paying Living Expenses: Not hard at all  Food Insecurity: Food Insecurity Present (03/08/2022)   Hunger Vital Sign    Worried About Running Out of Food in the Last Year: Sometimes true    Ran Out of Food in the Last Year: Sometimes true  Transportation Needs: No Transportation Needs (03/08/2022)   PRAPARE - Hydrologist (Medical): No    Lack of Transportation (Non-Medical): No  Physical Activity: Inactive (03/08/2022)   Exercise Vital Sign    Days of Exercise per Week: 0 days    Minutes of Exercise per Session: 0 min  Stress: No Stress Concern Present (03/08/2022)   [place]     Feeling of Stress : Not at all  Social Connections: Socially Isolated (03/08/2022)   Social Connection and Isolation Panel [NHANES]    Frequency of Communication with Friends and Family: More than three times a week    Frequency of Social Gatherings with Friends and Family: More than three times a week    Attends Religious Services: Never    Marine scientist or Organizations: No    Attends Music therapist: Never    Marital Status: Divorced    Tobacco Counseling Counseling given: No   Clinical Intake:  Pre-visit preparation completed: Yes  Pain : No/denies pain     Diabetes: No  How often do you need to have someone help you when you read instructions, pamphlets, or other written materials from your doctor or pharmacy?: 1 - Never  Diabetic?NO  Interpreter Needed?: No      Activities of Daily Living    03/08/2022   10:11 AM  In your present state of health, do you have any difficulty performing the following activities:  Hearing? 0  Vision? 0  Difficulty concentrating or making decisions? 1  Walking or climbing stairs? 0  Dressing or bathing? 0  Doing errands, shopping? 0  Preparing Food and eating ? N  Using the Toilet? N  In the past six months, have you accidently leaked urine? N  Do you have problems with loss of bowel control? N  Managing your Medications? N  Managing your Finances? N  Housekeeping or managing your Housekeeping? N    Patient Care Team: Gildardo Pounds, NP as PCP - General (Nurse Practitioner) Buford Dresser, MD as PCP - Cardiology (Cardiology) [name] Cristopher Estimable, MD as Consulting Physician (Gastroenterology)  Indicate any recent Medical Services you may have received from other than [facility] providers in the past year (date may be approximate).     Assessment:   This is a routine wellness examination for Monica Scott.  Hearing/Vision  screen No results found.  Dietary issues and exercise activities  discussed: Current Exercise Habits: The patient does not participate in regular exercise at present, Exercise limited by: None identified   Goals Addressed   None    Depression Screen    03/08/2022   10:20 AM 09/07/2021    1:48 PM 02/18/2021    2:08 PM 08/15/2020    9:28 AM 08/08/2019    2:42 PM 12/14/2018    1:42 PM 09/15/2018   10:06 AM  PHQ 2/9 Scores  PHQ - 2 Score 0 0 '2 1 1 '$ 0 0  PHQ- 9 Score  '6 7 3 6  '$ 0    Fall Risk    03/08/2022   10:07 AM 01/27/2022   11:01 AM 09/07/2021    1:45 PM 02/18/2021    2:08 PM 01/07/2021    4:11 PM  Fall Risk   Falls in the past year? 0 1 0 0 0  Number falls in past yr: 0 0 0 0 0  Injury with Fall? 0 0 0 0 0  Risk for fall due to : No Fall Risks Other (Comment)  No Fall Risks No Fall Risks    FALL RISK PREVENTION PERTAINING TO THE HOME:  Any stairs in or around the home? No  If so, are there any without handrails? No  Home free of loose throw rugs in walkways, pet beds, electrical cords, etc? Yes  Adequate lighting in your home to reduce risk of falls? Yes   ASSISTIVE DEVICES UTILIZED TO PREVENT FALLS:  Life alert? No  Use of a cane, walker or w/c? No  Grab bars in the bathroom? No  Shower chair or bench in shower? No  Elevated toilet seat or a handicapped toilet? No   TIMED UP AND GO:  Was the test performed? No .  Length of time to ambulate 10 feet: N/A sec.   Gait slow and steady without use of assistive device  Cognitive Function:    03/08/2022   10:07 AM 01/07/2021    4:23 PM  MMSE - Mini Mental State Exam  Orientation to time 5 5  Orientation to Place 5 5  Registration 3 3  Attention/ Calculation 5 5  Recall 3 3  Language- name 2 objects 2 2  Language- repeat 1 1  Language- follow 3 step command 3 3  Language- read & follow direction 1 1  Write a sentence 1 1  Copy design 1 0  Total score 30 29        03/08/2022   10:08 AM  6CIT Screen  What Year? 0 points  What month? 0 points  What time? 0 points   Count back from 20 0 points  Repeat phrase 0 points    Immunizations Immunization History  Administered Date(s) Administered   Fluad Quad(high Dose 65+) 02/06/2021   Influenza,inj,Quad PF,6+ Mos 12/06/2017, 11/28/2018, 01/03/2020, 01/27/2022   PFIZER(Purple Top)SARS-COV-2 Vaccination 06/24/2019, 07/08/2019, 03/07/2020   PNEUMOCOCCAL CONJUGATE-20 03/09/2021   Tdap 01/07/2021   Zoster Recombinat (Shingrix) 09/07/2021    TDAP status: Up to date  Flu Vaccine status: Up to date  Pneumococcal vaccine status: Up to date  Covid-19 vaccine status: Completed vaccines  Qualifies for Shingles Vaccine? Yes   Zostavax completed  yes  1st dose has been done Shingrix Completed?: No.    Education has been provided regarding the importance of this vaccine. Patient has been advised to call insurance company to determine out of pocket expense if they have  not yet received this vaccine. Advised may also receive vaccine at local pharmacy or Health Dept. Verbalized acceptance and understanding.  Screening Tests Health Maintenance  Topic Date Due   DEXA SCAN  Never done   Zoster Vaccines- Shingrix (2 of 2) 11/02/2021   COVID-19 Vaccine (4 - 2023-24 season) 11/27/2021   Medicare Annual Wellness (AWV)  01/07/2022   Lung Cancer Screening  10/09/2022   MAMMOGRAM  08/14/2023   DTaP/Tdap/Td (2 - Td or Tdap) 01/08/2031   COLONOSCOPY (Pts 45-38yr Insurance coverage will need to be confirmed)  09/22/2031   Pneumonia Vaccine 66 Years old  Completed   INFLUENZA VACCINE  Completed   Hepatitis C Screening  Completed   HPV VACCINES  Aged Out    Health Maintenance  Health Maintenance Due  Topic Date Due   DEXA SCAN  Never done   Zoster Vaccines- Shingrix (2 of 2) 11/02/2021   COVID-19 Vaccine (4 - 2023-24 season) 11/27/2021   Medicare Annual Wellness (AWV)  01/07/2022    Colorectal cancer screening: Type of screening: Colonoscopy. Completed 09/21/2021. Repeat every 10 years  Mammogram status:  Completed 08/13/2021. Repeat every year  Bone Density status: Ordered 03/08/2022. Pt provided with contact info and advised to call to schedule appt.  Lung Cancer Screening: (Low Dose CT Chest recommended if Age 66-80years, 30 pack-year currently smoking OR have quit w/in 15years.) does qualify. Completed 10/08/2021  Lung Cancer Screening Referral: no  Additional Screening:  Hepatitis C Screening: does qualify; Completed 06/04/2020  Vision Screening: Recommended annual ophthalmology exams for early detection of glaucoma and other disorders of the eye. Is the patient up to date with their annual eye exam?  Yes  Who is the provider or what is the name of the office in which the patient attends annual eye exams? N/a If pt is not established with a provider, would they like to be referred to a provider to establish care? No .   Dental Screening: Recommended annual dental exams for proper oral hygiene  Community Resource Referral / Chronic Care Management: CRR required this visit?  No   CCM required this visit?  No      Plan:     I have personally reviewed and noted the following in the patient's chart:   Medical and social history Use of alcohol, tobacco or illicit drugs  Current medications and supplements including opioid prescriptions. Patient is not currently taking opioid prescriptions. Functional ability and status Nutritional status Physical activity Advanced directives List of other physicians Hospitalizations, surgeries, and ER visits in previous 12 months Vitals Screenings to include cognitive, depression, and falls Referrals and appointments  In addition, I have reviewed and discussed with patient certain preventive protocols, quality metrics, and best practice recommendations. A written personalized care plan for preventive services as well as general preventive health recommendations were provided to patient.     AGomez [place]  03/08/2022   Nurse  Notes: I spent 30 minutes on this telephone encounter AVS mailed to Monica Scott

## 2022-03-10 ENCOUNTER — Ambulatory Visit: Payer: Medicare (Managed Care) | Admitting: Nurse Practitioner

## 2022-03-11 ENCOUNTER — Other Ambulatory Visit: Payer: Self-pay | Admitting: Nurse Practitioner

## 2022-03-11 DIAGNOSIS — E782 Mixed hyperlipidemia: Secondary | ICD-10-CM

## 2022-03-11 DIAGNOSIS — K219 Gastro-esophageal reflux disease without esophagitis: Secondary | ICD-10-CM

## 2022-03-11 NOTE — Telephone Encounter (Signed)
Requested Prescriptions  Pending Prescriptions Disp Refills   omeprazole (PRILOSEC) 40 MG capsule [Pharmacy Med Name: OMEPRAZOLE DR 40 MG CAPSULE] 180 capsule 0    Sig: TAKE 1 CAPSULE (40 MG TOTAL) BY MOUTH IN THE MORNING AND AT BEDTIME.     Gastroenterology: Proton Pump Inhibitors Passed - 03/11/2022 12:48 AM      Passed - Valid encounter within last 12 months    Recent Outpatient Visits           1 month ago Prediabetes   [place], [place], [place]   6 months ago Essential hypertension   [place], Vernia Buff, NP   1 year ago Hypertension, essential, benign   [place], Vernia Buff, NP   1 year ago [employer] [facility] [employer] preventive visit   [place], Vernia Buff, NP   1 year ago Essential hypertension   [place], Vernia Buff, NP       Future Appointments             In 1 month Gildardo Pounds, NP [place]   In 1 month [place], Valora Corporal, NP Urogynecology at [facility], [facility]             atorvastatin (LIPITOR) 20 MG tablet [Pharmacy Med Name: ATORVASTATIN 20 MG TABLET] 90 tablet 1    Sig: TAKE 1 TABLET BY MOUTH EVERY DAY     Cardiovascular:  Antilipid - Statins Failed - 03/11/2022 12:48 AM      Failed - Lipid Panel in normal range within the last 12 months    Cholesterol, Total  Date Value Ref Range Status  02/22/2022 260 (H) 100 - 199 mg/dL Final   LDL Chol Calc (NIH)  Date Value Ref Range Status  02/22/2022 160 (H) 0 - 99 mg/dL Final   HDL  Date Value Ref Range Status  02/22/2022 56 >39 mg/dL Final   Triglycerides  Date Value Ref Range Status  02/22/2022 240 (H) 0 - 149 mg/dL Final         Passed - Patient is not pregnant      Passed - Valid encounter within last 12 months    Recent Outpatient Visits           1 month  ago Prediabetes   [place], [place]   6 months ago Essential hypertension   [place], Vernia Buff, NP   1 year ago Hypertension, essential, benign   [place], Vernia Buff, NP   1 year ago [facility] [employer] preventive visit   [place], Vernia Buff, NP   1 year ago Essential hypertension   [place], Vernia Buff, NP       Future Appointments             In 1 month Gildardo Pounds, NP [place]   In 1 month [place], Valora Corporal, NP Urogynecology at [facility], [facility]

## 2022-03-11 NOTE — Telephone Encounter (Signed)
Requested medications are due for refill today.  Unsure  Requested medications are on the active medications list.  yes  Last refill. 09/07/2021 #180 0 rf  Future visit scheduled.   yes  Notes to clinic.  Rx written to expired 01/22/2022 - RX is expired.    Requested Prescriptions  Pending Prescriptions Disp Refills   omeprazole (PRILOSEC) 40 MG capsule [Pharmacy Med Name: OMEPRAZOLE DR 40 MG CAPSULE] 180 capsule 0    Sig: TAKE 1 CAPSULE (40 MG TOTAL) BY MOUTH IN THE MORNING AND AT BEDTIME.     Gastroenterology: Proton Pump Inhibitors Passed - 03/11/2022 12:48 AM      Passed - Valid encounter within last 12 months    Recent Outpatient Visits           1 month ago Prediabetes   [place], [place], [place]   6 months ago Essential hypertension   [place], Vernia Buff, NP   1 year ago Hypertension, essential, benign   [place], Vernia Buff, NP   1 year ago [facility] [employer] preventive visit   [place], Vernia Buff, NP   1 year ago Essential hypertension   [place], Vernia Buff, NP       Future Appointments             In 1 month Gildardo Pounds, NP [place]   In 1 month [place], Valora Corporal, NP Urogynecology at [facility], [facility]            Signed Prescriptions Disp Refills   atorvastatin (LIPITOR) 20 MG tablet 90 tablet 1    Sig: TAKE 1 TABLET BY MOUTH EVERY DAY     Cardiovascular:  Antilipid - Statins Failed - 03/11/2022 12:48 AM      Failed - Lipid Panel in normal range within the last 12 months    Cholesterol, Total  Date Value Ref Range Status  02/22/2022 260 (H) 100 - 199 mg/dL Final   LDL Chol Calc (NIH)  Date Value Ref Range Status  02/22/2022 160 (H) 0 - 99 mg/dL Final   HDL  Date Value Ref Range Status  02/22/2022  56 >39 mg/dL Final   Triglycerides  Date Value Ref Range Status  02/22/2022 240 (H) 0 - 149 mg/dL Final         Passed - Patient is not pregnant      Passed - Valid encounter within last 12 months    Recent Outpatient Visits           1 month ago Prediabetes   [place], [place]   6 months ago Essential hypertension   [place], Vernia Buff, NP   1 year ago Hypertension, essential, benign   [place], Vernia Buff, NP   1 year ago [facility] [employer] preventive visit   [place], Vernia Buff, NP   1 year ago Essential hypertension   [place], Vernia Buff, NP       Future Appointments             In 1 month Gildardo Pounds, NP [place]   In 1  month [place], NP [facility]gynecology at [facility], [facility]

## 2022-03-15 ENCOUNTER — Ambulatory Visit
Admission: RE | Admit: 2022-03-15 | Discharge: 2022-03-15 | Disposition: A | Discharge status: Home / Self Care | Payer: Medicare (Managed Care) | Source: Ambulatory Visit | Attending: Nurse Practitioner | Admitting: Nurse Practitioner

## 2022-03-15 DIAGNOSIS — E2839 Other primary ovarian failure: Secondary | ICD-10-CM

## 2022-03-16 ENCOUNTER — Telehealth (HOSPITAL_BASED_OUTPATIENT_CLINIC_OR_DEPARTMENT_OTHER): Payer: Self-pay

## 2022-03-16 DIAGNOSIS — E7800 Pure hypercholesterolemia, unspecified: Secondary | ICD-10-CM

## 2022-03-16 MED ORDER — ROSUVASTATIN CALCIUM 40 MG PO TABS: 40.0000 mg | ORAL_TABLET | Freq: Every day | ORAL | 3 refills | Status: DC

## 2022-03-16 NOTE — Telephone Encounter (Addendum)
Called results to patient and left results on VM (ok per DPR), instructions left to call office back if patient has any questions!   ----- Message from Loel Dubonnet, NP sent at 03/15/2022 10:10 PM EST ----- Cholesterol very elevated. Recommend LDL (bad cholesterol) less than 70 and it is 160.   Stop Atorvastatin.  Start Rosuvastatin '40mg'$  daily.   FLP/LFT in 6-8 weeks. If LDL not at goal at that time, consider referral to lipid clinic.

## 2022-03-19 ENCOUNTER — Other Ambulatory Visit (INDEPENDENT_AMBULATORY_CARE_PROVIDER_SITE_OTHER): Payer: Self-pay | Admitting: Primary Care

## 2022-03-19 DIAGNOSIS — M810 Age-related osteoporosis without current pathological fracture: Secondary | ICD-10-CM

## 2022-03-19 MED ORDER — ALENDRONATE SODIUM 70 MG PO TABS: 70.0000 mg | ORAL_TABLET | ORAL | 11 refills | Status: DC

## 2022-03-24 ENCOUNTER — Other Ambulatory Visit: Payer: Self-pay | Admitting: Pulmonary Disease

## 2022-04-05 ENCOUNTER — Other Ambulatory Visit: Payer: Self-pay | Admitting: Nurse Practitioner

## 2022-04-05 ENCOUNTER — Other Ambulatory Visit: Payer: Self-pay | Admitting: Family Medicine

## 2022-04-05 DIAGNOSIS — I10 Essential (primary) hypertension: Secondary | ICD-10-CM

## 2022-04-05 DIAGNOSIS — G2581 Restless legs syndrome: Secondary | ICD-10-CM

## 2022-04-06 ENCOUNTER — Other Ambulatory Visit: Payer: Self-pay | Admitting: Pulmonary Disease

## 2022-04-06 DIAGNOSIS — J449 Chronic obstructive pulmonary disease, unspecified: Secondary | ICD-10-CM

## 2022-04-13 ENCOUNTER — Encounter: Payer: Self-pay | Admitting: Nurse Practitioner

## 2022-04-13 ENCOUNTER — Ambulatory Visit: Payer: Medicare (Managed Care) | Attending: Nurse Practitioner | Admitting: Nurse Practitioner

## 2022-04-13 VITALS — BP 97/66 | HR 86 | Ht 59.0 in | Wt 155.8 lb

## 2022-04-13 DIAGNOSIS — F329 Major depressive disorder, single episode, unspecified: Secondary | ICD-10-CM

## 2022-04-13 DIAGNOSIS — R7303 Prediabetes: Secondary | ICD-10-CM

## 2022-04-13 DIAGNOSIS — I10 Essential (primary) hypertension: Secondary | ICD-10-CM | POA: Diagnosis not present

## 2022-04-13 DIAGNOSIS — Z2911 Encounter for prophylactic immunotherapy for respiratory syncytial virus (RSV): Secondary | ICD-10-CM

## 2022-04-13 DIAGNOSIS — Z23 Encounter for immunization: Secondary | ICD-10-CM

## 2022-04-13 LAB — POCT GLYCOSYLATED HEMOGLOBIN (HGB A1C): HbA1c, POC (prediabetic range): 5.7 % (ref 5.7–6.4)

## 2022-04-13 LAB — GLUCOSE, POCT (MANUAL RESULT ENTRY): POC Glucose: 114 mg/dl — AB (ref 70–99)

## 2022-04-13 MED ORDER — AMLODIPINE BESYLATE 5 MG PO TABS: 5.0000 mg | ORAL_TABLET | Freq: Every day | ORAL | 1 refills | Status: DC

## 2022-04-13 MED ORDER — BUPROPION HCL ER (XL) 150 MG PO TB24: 150.0000 mg | ORAL_TABLET | Freq: Every day | ORAL | 1 refills | Status: AC

## 2022-04-13 MED ORDER — RSVPREF3 VAC RECOMB ADJUVANTED 120 MCG/0.5ML IM SUSR: 0.5000 mL | Freq: Once | INTRAMUSCULAR | 0 refills | Status: AC

## 2022-04-13 NOTE — Progress Notes (Signed)
No concerns. 

## 2022-04-13 NOTE — Progress Notes (Signed)
Assessment & Plan:  Monica Scott was seen today for prediabetes and hypertension.  Diagnoses and all orders for this visit:  Prediabetes WELL CONTROLLED -     POCT glycosylated hemoglobin (Hb A1C) -     POCT glucose (manual entry)  Major depressive disorder with single episode, remission status unspecified WELL CONTROLLED -     buPROPion (WELLBUTRIN XL) 150 MG 24 hr tablet; Take 1 tablet (150 mg total) by mouth at bedtime.  Essential hypertension -     amLODipine (NORVASC) 5 MG tablet; Take 1 tablet (5 mg total) by mouth daily. CHECK BLOOD PRESSURE OVER THE NEXT SEVERAL DAYS WITH NEW CHANGE IN MEDICATION Continue all antihypertensives as prescribed.  Reminded to bring in blood pressure log for follow  up appointment.  RECOMMENDATIONS: DASH/Mediterranean Diets are healthier choices for HTN.    Need for shingles vaccine -     Varicella-zoster vaccine IM  Need for prophylactic vaccination and inoculation against respiratory syncytial virus (RSV) -     RSV vaccine recomb adjuvanted (AREXVY) 120 MCG/0.5ML injection; Inject 0.5 mLs into the muscle once for 1 dose.    Patient has been counseled on age-appropriate routine health concerns for screening and prevention. These are reviewed and up-to-date. Referrals have been placed accordingly. Immunizations are up-to-date or declined.    Subjective:   Chief Complaint  Patient presents with   Prediabetes   Hypertension   HPI Monica Scott 67 y.o. female presents to office today for follow up to HTN and prediabetes.   She has a past medical history of Arthritis, Asthma, Bladder disease, COPD, Depression, GERD, Hyperlipidemia, Hypertension, Peptic ulcer, and Urinary retention.   Prediabetes Well controlled with diet only at this time.  Lab Results  Component Value Date   HGBA1C 5.7 04/13/2022    Lab Results  Component Value Date   HGBA1C 5.8 (H) 09/07/2021    HTN Blood pressure is low normal today.  We will decrease  amlodipine from 10 mg to 5 mg today and she will continue on Benicar 40 mg daily. BP Readings from Last 3 Encounters:  04/13/22 97/66  02/22/22 122/80  01/27/22 125/82     Review of Systems  Constitutional:  Negative for fever, malaise/fatigue and weight loss.  HENT: Negative.  Negative for nosebleeds.   Eyes: Negative.  Negative for blurred vision, double vision and photophobia.  Respiratory: Negative.  Negative for cough and shortness of breath.   Cardiovascular: Negative.  Negative for chest pain, palpitations and leg swelling.  Gastrointestinal: Negative.  Negative for heartburn, nausea and vomiting.  Musculoskeletal: Negative.  Negative for myalgias.  Neurological: Negative.  Negative for dizziness, focal weakness, seizures and headaches.  Psychiatric/Behavioral: Negative.  Negative for suicidal ideas.     Past Medical History:  Diagnosis Date   Arthritis    Asthma    Bladder disease    COPD (chronic obstructive pulmonary disease) ([place])    Depression    GERD (gastroesophageal reflux disease)    Hyperlipidemia    Hypertension    Peptic ulcer    Urinary retention     Past Surgical History:  Procedure Laterality Date   BIOPSY  04/24/2018   Procedure: BIOPSY;  Surgeon: Daneil Dolin, MD;  [facility];  Service: Endoscopy;;  esophagus   COLONOSCOPY WITH PROPOFOL N/A 04/24/2018   Procedure: COLONOSCOPY WITH PROPOFOL;  Surgeon: Daneil Dolin, MD;  [facility];  Service: Endoscopy;  Laterality: N/A;  9:00am   COLONOSCOPY WITH PROPOFOL N/A  09/21/2021   Procedure: COLONOSCOPY WITH PROPOFOL;  Surgeon: Daneil Dolin, MD;  [facility];  Service: Endoscopy;  Laterality: N/A;  2:15 / ASA 2   ESOPHAGOGASTRODUODENOSCOPY (EGD) WITH PROPOFOL N/A 04/24/2018   Procedure: ESOPHAGOGASTRODUODENOSCOPY (EGD) WITH PROPOFOL;  Surgeon: Daneil Dolin, MD;  [facility];  Service: Endoscopy;  Laterality: N/A;   MALONEY DILATION N/A 04/24/2018    Procedure: Venia Minks DILATION;  Surgeon: Daneil Dolin, MD;  [facility];  Service: Endoscopy;  Laterality: N/A;   PILONIDAL CYST EXCISION     age 45   POLYPECTOMY  04/24/2018   Procedure: POLYPECTOMY;  Surgeon: Daneil Dolin, MD;  [facility];  Service: Endoscopy;;  colon    POLYPECTOMY  09/21/2021   Procedure: POLYPECTOMY;  Surgeon: Daneil Dolin, MD;  [facility];  Service: Endoscopy;;   VAGINAL HYSTERECTOMY      Family History  Problem Relation Age of Onset   Alcohol abuse Mother    Arthritis Mother    Cancer Mother    Depression Mother    Early death Mother    Alcohol abuse Father    Depression Father    Alcohol abuse Sister    Depression Sister    Alcohol abuse Daughter    Depression Daughter    COPD Maternal Grandfather    Heart disease Maternal Grandfather    Hypertension Maternal Grandfather    Alcohol abuse Brother    Colon cancer Brother        deceased at age 80   Alcohol abuse Son    Breast cancer Neg Hx     Social History Reviewed with no changes to be made today.   Outpatient Medications Prior to Visit  Medication Sig Dispense Refill   acetaminophen (TYLENOL) 325 MG tablet Take 650 mg by mouth every 6 (six) hours as needed for mild pain.     albuterol (ACCUNEB) 0.63 MG/3ML nebulizer solution Take 3 mLs (0.63 mg total) by nebulization every 6 (six) hours as needed for wheezing. 75 mL 12   albuterol (VENTOLIN HFA) 108 (90 Base) MCG/ACT inhaler INHALE 1-2 PUFFS EVERY 6 HOURS AS NEEDED FOR WHEEZE OR SHORTNESS OF BREATH 8.5 each 1   alendronate (FOSAMAX) 70 MG tablet Take 1 tablet (70 mg total) by mouth every 7 (seven) days. Take with a full glass of water on an empty stomach. 4 tablet 11   aspirin EC 81 MG tablet Take 1 tablet (81 mg total) by mouth daily. Swallow whole. 90 tablet 3   estradiol (ESTRACE) 0.1 MG/GM vaginal cream Place 0.5 g vaginally 2 (two) times a week. Place 0.5g nightly for two weeks then twice a week  after 30 g 11   FLUoxetine (PROZAC) 20 MG capsule Take 60 mg by mouth every morning.     gabapentin (NEURONTIN) 400 MG capsule Take 1 capsule (400 mg total) by mouth 3 (three) times daily. (Patient taking differently: Take 800 mg by mouth at bedtime.) 90 capsule 6   ibuprofen (ADVIL,MOTRIN) 200 MG tablet Take 400 mg by mouth daily as needed for moderate pain.     KLOR-CON M20 20 MEQ tablet Take 20 mEq by mouth daily.     methocarbamol (ROBAXIN) 500 MG tablet TAKE 1 TABLET (500 MG TOTAL) BY MOUTH 2 (TWO) TIMES DAILY AS NEEDED FOR MUSCLE SPASMS. 40 tablet 0   OLANZapine (ZYPREXA) 10 MG tablet Take 10 mg by mouth at bedtime.      olmesartan (BENICAR)  40 MG tablet Take 1 tablet (40 mg total) by mouth daily. 90 tablet 1   omeprazole (PRILOSEC) 40 MG capsule Take 1 capsule (40 mg total) by mouth in the morning and at bedtime. 180 capsule 0   rosuvastatin (CRESTOR) 40 MG tablet Take 1 tablet (40 mg total) by mouth daily. 90 tablet 3   Semaglutide, 1 MG/DOSE, (OZEMPIC, 1 MG/DOSE,) 4 MG/3ML SOPN Inject 1 mg into the skin once a week. 9 mL 1   traZODone (DESYREL) 100 MG tablet Take 100 mg by mouth at bedtime.     TRELEGY ELLIPTA 100-62.5-25 MCG/ACT AEPB TAKE 1 PUFF BY MOUTH EVERY DAY 60 each 5   amLODipine (NORVASC) 5 MG tablet TAKE 1 TABLET BY MOUTH TWICE A DAY 180 tablet 0   buPROPion (WELLBUTRIN XL) 150 MG 24 hr tablet Take 1 tablet (150 mg total) by mouth at bedtime. 90 tablet 1   azithromycin (ZITHROMAX) 250 MG tablet Take 2 tablets on first day then 1 tablet daily until finished. (Patient not taking: Reported on 03/08/2022) 6 tablet 0   cephALEXin (KEFLEX) 500 MG capsule Take 1 capsule (500 mg total) by mouth 4 (four) times daily. (Patient not taking: Reported on 03/08/2022) 28 capsule 0   No facility-administered medications prior to visit.    Allergies  Allergen Reactions   Tizanidine Other (See Comments)    Caused dizziness and dry mouth. Patient prefers not to take this again.        Objective:    BP 97/66   Pulse 86   Ht '4\' 11"'$  (1.499 m)   Wt 155 lb 12.8 oz (70.7 kg)   SpO2 90%   BMI 31.47 kg/m  Wt Readings from Last 3 Encounters:  04/13/22 155 lb 12.8 oz (70.7 kg)  02/22/22 160 lb 12.8 oz (72.9 kg)  01/27/22 158 lb 6.4 oz (71.8 kg)    Physical Exam Vitals and nursing note reviewed.  Constitutional:      Appearance: She is well-developed.  HENT:     Head: Normocephalic and atraumatic.  Cardiovascular:     Rate and Rhythm: Normal rate and regular rhythm.     Heart sounds: Normal heart sounds. No murmur heard.    No friction rub. No gallop.  Pulmonary:     Effort: Pulmonary effort is normal. No tachypnea or respiratory distress.     Breath sounds: Normal breath sounds. No decreased breath sounds, wheezing, rhonchi or rales.  Chest:     Chest wall: No tenderness.  Abdominal:     General: Bowel sounds are normal.     Palpations: Abdomen is soft.  Musculoskeletal:        General: Normal range of motion.     Cervical back: Normal range of motion.  Skin:    General: Skin is warm and dry.  Neurological:     Mental Status: She is alert and oriented to person, place, and time.     Coordination: Coordination normal.  Psychiatric:        Behavior: Behavior normal. Behavior is cooperative.        Thought Content: Thought content normal.        Judgment: Judgment normal.          Patient has been counseled extensively about nutrition and exercise as well as the importance of adherence with medications and regular follow-up. The patient was given clear instructions to go to ER or return to medical center if symptoms don't improve, worsen or new problems develop. The patient  verbalized understanding.   Follow-up: Return in 3 months (on 07/13/2022).   Gildardo Pounds, FNP-BC [facility] [place], [place]   04/13/2022, 12:59 PM

## 2022-04-19 NOTE — Progress Notes (Unsigned)
[facility]   Subjective:     Chief Complaint: No chief complaint on file.  History of Present Illness: Monica Scott is a 67 y.o. female with stage III pelvic organ prolapse who presents for a pessary check. She is using a size #3 ring with support pessary. The pessary has been working well and she has no complaints. She is using vaginal estrogen. She denies vaginal bleeding.  Past Medical History: Patient  has a past medical history of Arthritis, Asthma, Bladder disease, COPD (chronic obstructive pulmonary disease) ([place]), Depression, GERD (gastroesophageal reflux disease), Hyperlipidemia, Hypertension, Peptic ulcer, and Urinary retention.   Past Surgical History: She  has a past surgical history that includes Vaginal hysterectomy; Pilonidal cyst excision; Colonoscopy with propofol (N/A, 04/24/2018); Esophagogastroduodenoscopy (egd) with propofol (N/A, 04/24/2018); maloney dilation (N/A, 04/24/2018); biopsy (04/24/2018); polypectomy (04/24/2018); Colonoscopy with propofol (N/A, 09/21/2021); and polypectomy (09/21/2021).   Medications: She has a current medication list which includes the following prescription(s): acetaminophen, albuterol, albuterol, alendronate, amlodipine, aspirin ec, bupropion, estradiol, fluoxetine, gabapentin, ibuprofen, klor-con m20, methocarbamol, olanzapine, olmesartan, omeprazole, rosuvastatin, ozempic (1 mg/dose), trazodone, and trelegy ellipta.   Allergies: Patient is allergic to tizanidine.   Social History: Patient  reports that she quit smoking about 12 years ago. Her smoking use included cigarettes. She has a 60.00 pack-year smoking history. She has never been exposed to tobacco smoke. She has never used smokeless tobacco. She reports that she does not drink alcohol and does not use drugs.      Objective:    Physical Exam: There were no vitals taken for this visit. Gen: No apparent distress, A&O x 3. Detailed Urogynecologic Evaluation:   Pelvic Exam: Normal external female genitalia; Bartholin's and Skene's glands normal in appearance; urethral meatus {urethra:24773}, no urethral masses or discharge. The pessary was noted to be {in place:24774}. It was removed and cleaned. Speculum exam revealed {vaginal lesions:24775} in the vagina. The pessary was replaced. It was comfortable to the patient and fit well.       No data to display          Laboratory Results: Urine dipstick shows: {ua dip:315374::"negative for all components"}.    Assessment/Plan:    Assessment: Monica Scott is a 67 y.o. with {PFD symptoms:24771} here for a pessary check. She is doing well.  Plan: She will {pessary plan:24776}. She will continue to use {lubricant:24777}. She will follow-up in *** {days/wks/mos/yrs:310907} for a pessary check or sooner as needed.  All questions were answered.   Time Spent:

## 2022-04-22 ENCOUNTER — Encounter: Payer: Self-pay | Admitting: Obstetrics and Gynecology

## 2022-04-22 ENCOUNTER — Ambulatory Visit (INDEPENDENT_AMBULATORY_CARE_PROVIDER_SITE_OTHER): Payer: Medicare (Managed Care) | Admitting: Obstetrics and Gynecology

## 2022-04-22 VITALS — BP 86/63 | HR 82

## 2022-04-22 DIAGNOSIS — N993 Prolapse of vaginal vault after hysterectomy: Secondary | ICD-10-CM | POA: Diagnosis not present

## 2022-04-22 DIAGNOSIS — N8110 Cystocele, unspecified: Secondary | ICD-10-CM

## 2022-04-22 DIAGNOSIS — N952 Postmenopausal atrophic vaginitis: Secondary | ICD-10-CM

## 2022-04-22 DIAGNOSIS — Z4689 Encounter for fitting and adjustment of other specified devices: Secondary | ICD-10-CM | POA: Diagnosis not present

## 2022-04-22 DIAGNOSIS — I959 Hypotension, unspecified: Secondary | ICD-10-CM | POA: Diagnosis not present

## 2022-04-22 MED ORDER — ESTRADIOL 7.5 MCG/24HR VA RING: 1.0000 | VAGINAL_RING | VAGINAL | 3 refills | Status: DC

## 2022-04-22 NOTE — Patient Instructions (Signed)
Call your PCP about your blood pressure  Use the estrogen cream every night for two weeks and then twice weekly until you bring the estrogen ring in at your next check.

## 2022-04-23 ENCOUNTER — Telehealth: Payer: Self-pay | Admitting: Nurse Practitioner

## 2022-04-23 NOTE — Telephone Encounter (Signed)
Unable to reach patient by phone to relay results.  Detailed voicemail left with provider message per DPR.

## 2022-04-23 NOTE — Telephone Encounter (Signed)
I s/w Ms. Monica Scott. She will call to schedule an appt for nurse visit to check BP on a Friday in 1-2 weeks

## 2022-05-07 ENCOUNTER — Ambulatory Visit: Payer: Medicare (Managed Care) | Attending: Family Medicine

## 2022-05-07 DIAGNOSIS — Z0130 Encounter for examination of blood pressure without abnormal findings: Secondary | ICD-10-CM | POA: Diagnosis not present

## 2022-05-07 DIAGNOSIS — I10 Essential (primary) hypertension: Secondary | ICD-10-CM

## 2022-05-07 NOTE — Progress Notes (Signed)
Patient in today for Blood pressure check. BP 111/72 HR 71. PCP made aware. Patient instructed to continue benicar and amlodipine as prescribed per [name]. NP. Patient voiced understanding of all discussed .

## 2022-05-18 ENCOUNTER — Other Ambulatory Visit: Payer: Self-pay | Admitting: Pulmonary Disease

## 2022-05-18 DIAGNOSIS — J449 Chronic obstructive pulmonary disease, unspecified: Secondary | ICD-10-CM

## 2022-05-21 ENCOUNTER — Other Ambulatory Visit: Payer: Self-pay | Admitting: Family Medicine

## 2022-05-21 DIAGNOSIS — G2581 Restless legs syndrome: Secondary | ICD-10-CM

## 2022-06-07 ENCOUNTER — Other Ambulatory Visit: Payer: Self-pay | Admitting: Family Medicine

## 2022-06-07 ENCOUNTER — Other Ambulatory Visit: Payer: Self-pay | Admitting: Nurse Practitioner

## 2022-06-07 DIAGNOSIS — K219 Gastro-esophageal reflux disease without esophagitis: Secondary | ICD-10-CM

## 2022-06-07 DIAGNOSIS — I10 Essential (primary) hypertension: Secondary | ICD-10-CM

## 2022-06-09 ENCOUNTER — Other Ambulatory Visit: Payer: Self-pay | Admitting: Physician Assistant

## 2022-06-09 NOTE — Telephone Encounter (Signed)
Requested medication (s) are due for refill today - unknown  Requested medication (s) are on the active medication list -yes  Future visit scheduled -yes  Last refill: 03/17/22  Notes to clinic: Rx listed as historical medication- sent for review   Requested Prescriptions  Pending Prescriptions Disp Refills   KLOR-CON M20 20 MEQ tablet [Pharmacy Med Name: KLOR-CON M20 TABLET] 30 tablet 3    Sig: TAKE 1 TABLET BY MOUTH DAILY FOR 5 DAYS.     Endocrinology:  Minerals - Potassium Supplementation Passed - 06/09/2022  2:16 AM      Passed - K in normal range and within 360 days    Potassium  Date Value Ref Range Status  01/27/2022 4.6 3.5 - 5.2 mmol/L Final         Passed - Cr in normal range and within 360 days    Creatinine, Ser  Date Value Ref Range Status  01/27/2022 0.90 0.57 - 1.00 mg/dL Final         Passed - Valid encounter within last 12 months    Recent Outpatient Visits           1 month ago Prediabetes   [place], Vernia Buff, NP   4 months ago Prediabetes   [place], [place]   9 months ago Essential hypertension   [place], NP   1 year ago Hypertension, essential, benign   [place], Vernia Buff, NP   1 year ago [facility] [employer] preventive visit   [place] Gildardo Pounds, NP       Future Appointments             In 1 month Gildardo Pounds, NP [place]   In 1 month [place], Valora Corporal, NP [place] [facility] at [place] [facility], [facility]               Requested Prescriptions  Pending Prescriptions Disp Refills   KLOR-CON M20 20 MEQ tablet [Pharmacy Med Name: KLOR-CON M20 TABLET] 30 tablet 3    Sig: TAKE 1 TABLET BY MOUTH DAILY FOR 5 DAYS.     Endocrinology:  Minerals - Potassium  Supplementation Passed - 06/09/2022  2:16 AM      Passed - K in normal range and within 360 days    Potassium  Date Value Ref Range Status  01/27/2022 4.6 3.5 - 5.2 mmol/L Final         Passed - Cr in normal range and within 360 days    Creatinine, Ser  Date Value Ref Range Status  01/27/2022 0.90 0.57 - 1.00 mg/dL Final         Passed - Valid encounter within last 12 months    Recent Outpatient Visits           1 month ago Prediabetes   [place], Vernia Buff, NP   4 months ago Prediabetes   [place], [place]   9 months ago Essential hypertension   [place], Vernia Buff, NP   1 year ago Hypertension, essential, benign   [place], Vernia Buff, NP   1 year ago  [facility] [employer] preventive visit   [facility] [place], Vernia Buff, NP       Future Appointments             In 1 month Gildardo Pounds, NP [place]   In 1 month [place], Valora Corporal, NP [facility] at [place] [facility], [facility]

## 2022-06-28 ENCOUNTER — Ambulatory Visit (INDEPENDENT_AMBULATORY_CARE_PROVIDER_SITE_OTHER): Payer: Medicare (Managed Care) | Admitting: Pulmonary Disease

## 2022-06-28 ENCOUNTER — Encounter (HOSPITAL_BASED_OUTPATIENT_CLINIC_OR_DEPARTMENT_OTHER): Payer: Self-pay | Admitting: Pulmonary Disease

## 2022-06-28 VITALS — BP 100/70 | HR 91 | Temp 98.1°F | Ht 59.0 in | Wt 152.4 lb

## 2022-06-28 DIAGNOSIS — J432 Centrilobular emphysema: Secondary | ICD-10-CM | POA: Diagnosis not present

## 2022-06-28 DIAGNOSIS — J9611 Chronic respiratory failure with hypoxia: Secondary | ICD-10-CM | POA: Diagnosis not present

## 2022-06-28 NOTE — Progress Notes (Signed)
   Subjective:    Patient ID: Monica Scott, female    DOB: December 18, 1955, 67 y.o.   MRN: [id number]  HPI  67 yo ex-smoker  followed for COPD and chronic hypoxic respiratory failure started on oxygen July 2019 -on Oxygen 2l/m rest and 3l/m with activity  PMH-  mild obstructive sleep apnea declined CPAP Neurogenic bladder with self-catheterization  Chief Complaint  Patient presents with   Follow-up    Sob a lot doesn't feel like inhaler is working like it use to   65-month follow-up visit. She reports increased shortness of breath.  She was started on Trelegy but feels like this inhaler is not working as well as it used to for the last 3 months she has been using increasingly her albuterol inhaler. No interim ED visits or hospitalizations for COPD.  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    N PSG was done on February 13, 2019 that showed mild obstructive sleep apnea with AHI at 7.6 and SPO2 low at 73%.      Alpha-1 antitrypsin-MM, 139  Review of Systems neg for any significant sore throat, dysphagia, itching, sneezing, nasal congestion or excess/ purulent secretions, fever, chills, sweats, unintended wt loss, pleuritic or exertional cp, hempoptysis, orthopnea pnd or change in chronic leg swelling. Also denies presyncope, palpitations, heartburn, abdominal pain, nausea, vomiting, diarrhea or change in bowel or urinary habits, dysuria,hematuria, rash,  arthralgias, visual complaints, headache, numbness weakness or ataxia.     Objective:   Physical Exam  Gen. Pleasant, obese, in no distress ENT - no lesions, no post nasal drip Neck: No JVD, no thyromegaly, no carotid bruits Lungs: no use of accessory muscles, no dullness to percussion, decreased without rales or rhonchi  Cardiovascular: Rhythm regular, heart sounds  normal, no murmurs or gallops, no peripheral edema Musculoskeletal: No deformities, no cyanosis or clubbing , no tremors       Assessment & Plan:

## 2022-06-28 NOTE — Assessment & Plan Note (Signed)
She feels that Trelegy is not working as well and would like to switch back to Symbicort and Incruse combination that she was on. We will send in prescriptions for these medications. She will continue to use albuterol for rescue. We discussed signs and symptoms of COPD exacerbation and action plan for the same

## 2022-06-28 NOTE — Patient Instructions (Addendum)
X switch back from Trelegy to Symbicort 160 -2 puffs twice daily and Incruse -once daily  Cleared for surgery under spinal anesthesia

## 2022-06-28 NOTE — Assessment & Plan Note (Signed)
Continue using oxygen during sleep and activity. We discussed the risk of surgical procedure such as bladder tack.  I feel that this could be performed safely with spinal anesthesia with general anesthesia would pose some risk and I would be discussion between her and the surgeon as to the importance and need for this procedure

## 2022-06-29 ENCOUNTER — Other Ambulatory Visit: Payer: Self-pay | Admitting: Family Medicine

## 2022-06-29 DIAGNOSIS — G2581 Restless legs syndrome: Secondary | ICD-10-CM

## 2022-06-29 NOTE — Telephone Encounter (Signed)
Requested medication (s) are due for refill today: yes  Requested medication (s) are on the active medication list: yes  Last refill:  05/21/22  Future visit scheduled: yes  Notes to clinic:  Unable to refill per protocol, cannot delegate.      Requested Prescriptions  Pending Prescriptions Disp Refills   methocarbamol (ROBAXIN) 500 MG tablet [Pharmacy Med Name: METHOCARBAMOL 500 MG TABLET] 40 tablet 0    Sig: TAKE 1 TABLET (500 MG TOTAL) BY MOUTH 2 (TWO) TIMES DAILY AS NEEDED FOR MUSCLE SPASMS.     Not Delegated - Analgesics:  Muscle Relaxants Failed - 06/29/2022 11:39 AM      Failed - This refill cannot be delegated      Passed - Valid encounter within last 6 months    Recent Outpatient Visits           2 months ago Prediabetes   [place], Vernia Buff, NP   5 months ago Prediabetes   [place], [place]   9 months ago Essential hypertension   [place], NP   1 year ago Hypertension, essential, benign   [place], Vernia Buff, NP   1 year ago [facility] [employer] preventive visit   [facility] Gildardo Pounds, NP       Future Appointments             In 2 weeks Gildardo Pounds, NP [place]   In 3 weeks [place], Valora Corporal, NP [facility] at [facility], [facility]

## 2022-07-03 ENCOUNTER — Other Ambulatory Visit: Payer: Self-pay | Admitting: Family Medicine

## 2022-07-03 DIAGNOSIS — I10 Essential (primary) hypertension: Secondary | ICD-10-CM

## 2022-07-05 NOTE — Telephone Encounter (Signed)
Requested Prescriptions  Refused Prescriptions Disp Refills   amLODipine (NORVASC) 5 MG tablet [Pharmacy Med Name: AMLODIPINE BESYLATE 5 MG TAB] 180 tablet     Sig: TAKE 1 TABLET BY MOUTH TWICE A DAY     Cardiovascular: Calcium Channel Blockers 2 Passed - 07/03/2022  9:07 AM      Passed - Last BP in normal range    BP Readings from Last 1 Encounters:  06/28/22 100/70         Passed - Last Heart Rate in normal range    Pulse Readings from Last 1 Encounters:  06/28/22 91         Passed - Valid encounter within last 6 months    Recent Outpatient Visits           2 months ago Prediabetes   [facility] [place], Shea Stakes, NP   5 months ago Prediabetes   [facility], Marylene Land [place], [place]   10 months ago Essential hypertension   [place] [facility] [place], [place], NP   1 year ago Hypertension, essential, benign   [place] [place], Shea Stakes, NP   1 year ago [facility] [employer] preventive visit   [facility], Shea Stakes, NP       Future Appointments             In 1 week Cla[place] Rigg, NP [facility]   In 2 weeks [place], Joan Mayans, NP [facility] at [facility], [facility]

## 2022-07-13 ENCOUNTER — Other Ambulatory Visit: Payer: Self-pay

## 2022-07-13 ENCOUNTER — Other Ambulatory Visit: Payer: Self-pay | Admitting: Pulmonary Disease

## 2022-07-13 ENCOUNTER — Encounter: Payer: Self-pay | Admitting: Nurse Practitioner

## 2022-07-13 ENCOUNTER — Telehealth: Payer: Self-pay | Admitting: Pulmonary Disease

## 2022-07-13 ENCOUNTER — Ambulatory Visit: Payer: Medicare (Managed Care) | Attending: Nurse Practitioner | Admitting: Nurse Practitioner

## 2022-07-13 VITALS — BP 120/79 | HR 88 | Ht 59.0 in | Wt 153.8 lb

## 2022-07-13 DIAGNOSIS — E7800 Pure hypercholesterolemia, unspecified: Secondary | ICD-10-CM

## 2022-07-13 DIAGNOSIS — I10 Essential (primary) hypertension: Secondary | ICD-10-CM

## 2022-07-13 DIAGNOSIS — R7303 Prediabetes: Secondary | ICD-10-CM | POA: Diagnosis not present

## 2022-07-13 DIAGNOSIS — D72829 Elevated white blood cell count, unspecified: Secondary | ICD-10-CM | POA: Diagnosis not present

## 2022-07-13 DIAGNOSIS — E669 Obesity, unspecified: Secondary | ICD-10-CM

## 2022-07-13 DIAGNOSIS — Z713 Dietary counseling and surveillance: Secondary | ICD-10-CM | POA: Diagnosis not present

## 2022-07-13 DIAGNOSIS — J432 Centrilobular emphysema: Secondary | ICD-10-CM

## 2022-07-13 DIAGNOSIS — Z87891 Personal history of nicotine dependence: Secondary | ICD-10-CM | POA: Diagnosis not present

## 2022-07-13 DIAGNOSIS — Z6831 Body mass index (BMI) 31.0-31.9, adult: Secondary | ICD-10-CM | POA: Insufficient documentation

## 2022-07-13 DIAGNOSIS — Z7985 Long-term (current) use of injectable non-insulin antidiabetic drugs: Secondary | ICD-10-CM | POA: Insufficient documentation

## 2022-07-13 DIAGNOSIS — Z1231 Encounter for screening mammogram for malignant neoplasm of breast: Secondary | ICD-10-CM | POA: Diagnosis not present

## 2022-07-13 MED ORDER — GUAIFENESIN ER 600 MG PO TB12
600.0000 mg | ORAL_TABLET | Freq: Two times a day (BID) | ORAL | 1 refills | Status: DC
Start: 2022-07-13 — End: 2023-02-21

## 2022-07-13 MED ORDER — BUDESONIDE-FORMOTEROL FUMARATE 160-4.5 MCG/ACT IN AERO: 2.0000 | INHALATION_SPRAY | Freq: Two times a day (BID) | RESPIRATORY_TRACT | 3 refills | Status: DC

## 2022-07-13 MED ORDER — TIRZEPATIDE 2.5 MG/0.5ML ~~LOC~~ SOAJ: 2.5000 mg | SUBCUTANEOUS | 1 refills | Status: DC

## 2022-07-13 MED ORDER — INCRUSE ELLIPTA 62.5 MCG/ACT IN AEPB: 1.0000 | INHALATION_SPRAY | Freq: Every day | RESPIRATORY_TRACT | 3 refills | Status: DC

## 2022-07-13 MED ORDER — WEGOVY 0.25 MG/0.5ML ~~LOC~~ SOAJ: 0.2500 mg | SUBCUTANEOUS | 1 refills | Status: DC

## 2022-07-13 MED ORDER — PREDNISONE 20 MG PO TABS
40.0000 mg | ORAL_TABLET | Freq: Every day | ORAL | 0 refills | Status: DC
Start: 2022-07-13 — End: 2022-08-17

## 2022-07-13 NOTE — Progress Notes (Signed)
No concerns. 

## 2022-07-13 NOTE — Telephone Encounter (Signed)
Called and spoke with patient. Patient stated that her symbicort and incruse was never sent in to the pharmacy. Patient verified pharmacy. Advised patient I would send in prescriptions. Rx has been sent to the pharamcy.   Nothing further needed.

## 2022-07-13 NOTE — Progress Notes (Signed)
Assessment & Plan:  Laporche was seen today for hypertension.  Diagnoses and all orders for this visit:  Primary hypertension Well controlled -     CMP14+EGFR  Prediabetes -     Semaglutide-Weight Management (WEGOVY) 0.25 MG/0.5ML SOAJ; Inject 0.25 mg into the skin once a week. -     tirzepatide [facility]) 2.5 MG/0.5ML Pen; Inject 2.5 mg into the skin once a week. -     CMP14+EGFR -     Hemoglobin A1c  Centrilobular emphysema -     predniSONE (DELTASONE) 20 MG tablet; Take 2 tablets (40 mg total) by mouth daily with breakfast. -     guaiFENesin (MUCINEX) 600 MG 12 hr tablet; Take 1 tablet (600 mg total) by mouth 2 (two) times daily.  Former heavy tobacco smoker -     CT CHEST LUNG CA SCREEN LOW DOSE W/O CM; Future  Leukocytosis, unspecified type -     CBC with Differential -     CT CHEST LUNG CA SCREEN LOW DOSE W/O CM; Future  Hypercholesterolemia -     Lipid panel  Breast cancer screening by mammogram -     MM 3D SCREENING MAMMOGRAM BILATERAL BREAST; Future  Obesity (BMI 30-39.9) -     Semaglutide-Weight Management (WEGOVY) 0.25 MG/0.5ML SOAJ; Inject 0.25 mg into the skin once a week. -     tirzepatide [facility]) 2.5 MG/0.5ML Pen; Inject 2.5 mg into the skin once a week.    Patient has been counseled on age-appropriate routine health concerns for screening and prevention. These are reviewed and up-to-date. Referrals have been placed accordingly. Immunizations are up-to-date or declined.    Subjective:   Chief Complaint  Patient presents with   Hypertension   HPI Monica Scott 67 y.o. female presents to office today for follow up to HTN Wants to know if she should continue KLOR con. I have instructed her to stop this medication at this time.    HTN Blood pressure is well controlled with olmesartan 40 mg daily nad amlodipine 5 mg daily.  BP Readings from Last 3 Encounters:  07/13/22 120/79  06/28/22 100/70  05/07/22 111/72    Prediabetes Well  controlled. Her insurance would not cover ozempic.  Lab Results  Component Value Date   HGBA1C 5.7 04/13/2022  LDL not at goal. She is currently prescribed rosuvastatin 40 mg daily.   Lab Results  Component Value Date   LDLCALC 160 (H) 02/22/2022     Cough Patient complains of productive cough.  Symptoms began several days ago.  The cough is without wheezing, dyspnea or hemoptysis, productive of clear sputum, with shortness of breath during the cough, harsh and is aggravated by nothing Associated symptoms include:sputum production. Patient does not have new pets. Patient does not have a history of asthma however she does have a history of emphysema. Inhalers have been minimally effective with relieving congestion. Patient does have a history of environmental allergens. Patient did not have recent travel. Patient does have a history of smoking. Patient  had previous Chest X-ray. Patient   Review of Systems  Constitutional:  Negative for fever, malaise/fatigue and weight loss.  HENT: Negative.  Negative for nosebleeds.   Eyes: Negative.  Negative for blurred vision, double vision and photophobia.  Respiratory:  Positive for cough. Negative for shortness of breath.   Cardiovascular: Negative.  Negative for chest pain, palpitations and leg swelling.  Gastrointestinal: Negative.  Negative for heartburn, nausea and vomiting.  Musculoskeletal: Negative.  Negative for myalgias.  Neurological: Negative.  Negative for dizziness, focal weakness, seizures and headaches.  Psychiatric/Behavioral: Negative.  Negative for suicidal ideas.     Past Medical History:  Diagnosis Date   Arthritis    Asthma    Bladder disease    COPD (chronic obstructive pulmonary disease)    Depression    GERD (gastroesophageal reflux disease)    Hyperlipidemia    Hypertension    Peptic ulcer    Urinary retention     Past Surgical History:  Procedure Laterality Date   BIOPSY  04/24/2018   Procedure: BIOPSY;   Surgeon: Corbin Ade, MD;  [facility];  Service: Endoscopy;;  esophagus   COLONOSCOPY WITH PROPOFOL N/A 04/24/2018   Procedure: COLONOSCOPY WITH PROPOFOL;  Surgeon: Corbin Ade, MD;  [facility];  Service: Endoscopy;  Laterality: N/A;  9:00am   COLONOSCOPY WITH PROPOFOL N/A 09/21/2021   Procedure: COLONOSCOPY WITH PROPOFOL;  Surgeon: Corbin Ade, MD;  [facility];  Service: Endoscopy;  Laterality: N/A;  2:15 / ASA 2   ESOPHAGOGASTRODUODENOSCOPY (EGD) WITH PROPOFOL N/A 04/24/2018   Procedure: ESOPHAGOGASTRODUODENOSCOPY (EGD) WITH PROPOFOL;  Surgeon: Corbin Ade, MD;  [facility];  Service: Endoscopy;  Laterality: N/A;   MALONEY DILATION N/A 04/24/2018   Procedure: Elease Hashimoto DILATION;  Surgeon: Corbin Ade, MD;  [facility];  Service: Endoscopy;  Laterality: N/A;   PILONIDAL CYST EXCISION     age 89   POLYPECTOMY  04/24/2018   Procedure: POLYPECTOMY;  Surgeon: Corbin Ade, MD;  [facility];  Service: Endoscopy;;  colon    POLYPECTOMY  09/21/2021   Procedure: POLYPECTOMY;  Surgeon: Corbin Ade, MD;  [facility];  Service: Endoscopy;;   VAGINAL HYSTERECTOMY      Family History  Problem Relation Age of Onset   Alcohol abuse Mother    Arthritis Mother    Cancer Mother    Depression Mother    Early death Mother    Alcohol abuse Father    Depression Father    Alcohol abuse Sister    Depression Sister    Alcohol abuse Daughter    Depression Daughter    COPD Maternal Grandfather    Heart disease Maternal Grandfather    Hypertension Maternal Grandfather    Alcohol abuse Brother    Colon cancer Brother        deceased at age 91   Alcohol abuse Son    Breast cancer Neg Hx     Social History Reviewed with no changes to be made today.   Outpatient Medications Prior to Visit  Medication Sig Dispense Refill   acetaminophen (TYLENOL) 325 MG tablet Take 650 mg by mouth every 6 (six)  hours as needed for mild pain.     albuterol (ACCUNEB) 0.63 MG/3ML nebulizer solution Take 3 mLs (0.63 mg total) by nebulization every 6 (six) hours as needed for wheezing. 75 mL 12   albuterol (VENTOLIN HFA) 108 (90 Base) MCG/ACT inhaler INHALE 1-2 PUFFS EVERY 6 HOURS AS NEEDED FOR WHEEZE OR SHORTNESS OF BREATH 8.5 each 0   amLODipine (NORVASC) 5 MG tablet Take 1 tablet (5 mg total) by mouth daily. 90 tablet 1   aspirin EC 81 MG tablet Take 1 tablet (81 mg total) by mouth daily. Swallow whole. 90 tablet 3   buPROPion (WELLBUTRIN XL) 150 MG 24 hr tablet Take 1 tablet (150 mg total) by mouth at bedtime. 90 tablet 1  estradiol (ESTRING) 7.5 MCG/24HR vaginal ring Place 1 each vaginally every 3 (three) months. 1 each 3   FLUoxetine (PROZAC) 20 MG capsule Take 60 mg by mouth every morning.     gabapentin (NEURONTIN) 400 MG capsule Take 1 capsule (400 mg total) by mouth 3 (three) times daily. (Patient taking differently: Take 800 mg by mouth at bedtime.) 90 capsule 6   ibuprofen (ADVIL,MOTRIN) 200 MG tablet Take 400 mg by mouth daily as needed for moderate pain.     methocarbamol (ROBAXIN) 500 MG tablet TAKE 1 TABLET (500 MG TOTAL) BY MOUTH 2 (TWO) TIMES DAILY AS NEEDED FOR MUSCLE SPASMS. 40 tablet 0   OLANZapine (ZYPREXA) 10 MG tablet Take 10 mg by mouth at bedtime.      olmesartan (BENICAR) 40 MG tablet TAKE 1 TABLET BY MOUTH EVERY DAY 90 tablet 0   omeprazole (PRILOSEC) 40 MG capsule TAKE 1 CAPSULE (40 MG TOTAL) BY MOUTH IN THE MORNING AND AT BEDTIME. 180 capsule 0   rosuvastatin (CRESTOR) 40 MG tablet Take 1 tablet (40 mg total) by mouth daily. 90 tablet 3   traZODone (DESYREL) 100 MG tablet Take 100 mg by mouth at bedtime.     TRELEGY ELLIPTA 100-62.5-25 MCG/ACT AEPB TAKE 1 PUFF BY MOUTH EVERY DAY 60 each 5   alendronate (FOSAMAX) 70 MG tablet Take 1 tablet (70 mg total) by mouth every 7 (seven) days. Take with a full glass of water on an empty stomach. 4 tablet 11   KLOR-CON M20 20 MEQ tablet TAKE  1 TABLET BY MOUTH DAILY FOR 5 DAYS. 30 tablet 3   Semaglutide, 1 MG/DOSE, (OZEMPIC, 1 MG/DOSE,) 4 MG/3ML SOPN Inject 1 mg into the skin once a week. 9 mL 1   No facility-administered medications prior to visit.    Allergies  Allergen Reactions   Tizanidine Other (See Comments)    Caused dizziness and dry mouth. Patient prefers not to take this again.       Objective:    BP 120/79   Pulse 88   Ht 4\' 11"  (1.499 m)   Wt 153 lb 12.8 oz (69.8 kg)   SpO2 94%   BMI 31.06 kg/m  Wt Readings from Last 3 Encounters:  07/13/22 153 lb 12.8 oz (69.8 kg)  06/28/22 152 lb 6.4 oz (69.1 kg)  04/13/22 155 lb 12.8 oz (70.7 kg)    Physical Exam Vitals and nursing note reviewed.  Constitutional:      Appearance: She is well-developed.  HENT:     Head: Normocephalic and atraumatic.  Cardiovascular:     Rate and Rhythm: Normal rate and regular rhythm.     Heart sounds: Normal heart sounds. No murmur heard.    No friction rub. No gallop.  Pulmonary:     Effort: Pulmonary effort is normal. No tachypnea or respiratory distress.     Breath sounds: Decreased air movement present. Rhonchi present. No decreased breath sounds, wheezing or rales.  Chest:     Chest wall: No tenderness.  Abdominal:     General: Bowel sounds are normal.     Palpations: Abdomen is soft.  Musculoskeletal:        General: Normal range of motion.     Cervical back: Normal range of motion.  Skin:    General: Skin is warm and dry.  Neurological:     Mental Status: She is alert and oriented to person, place, and time.     Coordination: Coordination normal.  Psychiatric:  Behavior: Behavior normal. Behavior is cooperative.        Thought Content: Thought content normal.        Judgment: Judgment normal.          Patient has been counseled extensively about nutrition and exercise as well as the importance of adherence with medications and regular follow-up. The patient was given clear instructions to go to  ER or return to medical center if symptoms don't improve, worsen or new problems develop. The patient verbalized understanding.   Follow-up: Return in about 3 months (around 10/12/2022).   Claiborne Rigg, FNP-BC [facility] [place], [place] [phone]   07/13/2022, 5:36 PM

## 2022-07-13 NOTE — Telephone Encounter (Signed)
PT calling for Dr. Reginia Naas nurse about calling in the substitute for Trelegy . AVS notes state:  X switch back from Trelegy to Symbicort 160 -2 puffs twice daily and Incruse -once daily   Pharm is [facility] on [address]  [phone] is her #. TY

## 2022-07-14 LAB — CMP14+EGFR
ALT: 12 IU/L (ref 0–32)
AST: 18 IU/L (ref 0–40)
Albumin/Globulin Ratio: 2.2 (ref 1.2–2.2)
Albumin: 4.3 g/dL (ref 3.9–4.9)
Alkaline Phosphatase: 104 IU/L (ref 44–121)
BUN/Creatinine Ratio: 7 — ABNORMAL LOW (ref 12–28)
BUN: 6 mg/dL — ABNORMAL LOW (ref 8–27)
Bilirubin Total: <0.2 mg/dL (ref 0.0–1.2)
CO2: 20 mmol/L (ref 20–29)
Calcium: 10.6 mg/dL — ABNORMAL HIGH (ref 8.7–10.3)
Chloride: 102 mmol/L (ref 96–106)
Creatinine, Ser: 0.88 mg/dL (ref 0.57–1.00)
Globulin, Total: 2 g/dL (ref 1.5–4.5)
Glucose: 70 mg/dL (ref 70–99)
Potassium: 4.1 mmol/L (ref 3.5–5.2)
Sodium: 142 mmol/L (ref 134–144)
Total Protein: 6.3 g/dL (ref 6.0–8.5)
eGFR: 72 mL/min/{1.73_m2} (ref >59)

## 2022-07-14 LAB — CBC WITH DIFFERENTIAL/PLATELET
Basophils Absolute: 0.1 10*3/uL (ref 0.0–0.2)
Basos: 1 %
EOS (ABSOLUTE): 0.2 10*3/uL (ref 0.0–0.4)
Eos: 2 %
Hematocrit: 36.2 % (ref 34.0–46.6)
Hemoglobin: 12 g/dL (ref 11.1–15.9)
Immature Grans (Abs): 0 10*3/uL (ref 0.0–0.1)
Immature Granulocytes: 0 %
Lymphocytes Absolute: 2.7 10*3/uL (ref 0.7–3.1)
Lymphs: 30 %
MCH: 29.6 pg (ref 26.6–33.0)
MCHC: 33.1 g/dL (ref 31.5–35.7)
MCV: 89 fL (ref 79–97)
Monocytes Absolute: 0.7 10*3/uL (ref 0.1–0.9)
Monocytes: 7 %
Neutrophils Absolute: 5.3 10*3/uL (ref 1.4–7.0)
Neutrophils: 60 %
Platelets: 425 10*3/uL (ref 150–450)
RBC: 4.06 x10E6/uL (ref 3.77–5.28)
RDW: 15.4 % (ref 11.7–15.4)
WBC: 9 10*3/uL (ref 3.4–10.8)

## 2022-07-14 LAB — LIPID PANEL
Chol/HDL Ratio: 7.1 ratio — ABNORMAL HIGH (ref 0.0–4.4)
Cholesterol, Total: 271 mg/dL — ABNORMAL HIGH (ref 100–199)
HDL: 38 mg/dL — ABNORMAL LOW (ref >39)
LDL Chol Calc (NIH): 160 mg/dL — ABNORMAL HIGH (ref 0–99)
Triglycerides: 382 mg/dL — ABNORMAL HIGH (ref 0–149)
VLDL Cholesterol Cal: 73 mg/dL — ABNORMAL HIGH (ref 5–40)

## 2022-07-14 LAB — HEMOGLOBIN A1C
Est. average glucose Bld gHb Est-mCnc: 120 mg/dL
Hgb A1c MFr Bld: 5.8 % — ABNORMAL HIGH (ref 4.8–5.6)

## 2022-07-15 ENCOUNTER — Telehealth: Payer: Self-pay | Admitting: Pulmonary Disease

## 2022-07-15 NOTE — Telephone Encounter (Signed)
Pt called the office stating she was told by her pharmacy that her insurance company is not wanting to cover the Symbicort inhaler. Routing to prior auth team for review on this.

## 2022-07-16 ENCOUNTER — Other Ambulatory Visit: Payer: Self-pay | Admitting: Pulmonary Disease

## 2022-07-16 ENCOUNTER — Other Ambulatory Visit (HOSPITAL_COMMUNITY): Payer: Self-pay

## 2022-07-16 NOTE — Telephone Encounter (Signed)
Generic Advair Diskus/ [place] is a preferred medication.

## 2022-07-21 NOTE — Progress Notes (Deleted)
[facility]   Subjective:     Chief Complaint: No chief complaint on file.  History of Present Illness: ROLANDA CAMPA is a 67 y.o. female with stage III pelvic organ prolapse who presents for a pessary check. She is using a size #3 ring with support pessary. The pessary has been working well and she has no complaints. She {ACTION; IS/IS ZOX:09604540} using vaginal estrogen. She denies vaginal bleeding.  Past Medical History: Patient  has a past medical history of Arthritis, Asthma, Bladder disease, COPD (chronic obstructive pulmonary disease), Depression, GERD (gastroesophageal reflux disease), Hyperlipidemia, Hypertension, Peptic ulcer, and Urinary retention.   Past Surgical History: She  has a past surgical history that includes Vaginal hysterectomy; Pilonidal cyst excision; Colonoscopy with propofol (N/A, 04/24/2018); Esophagogastroduodenoscopy (egd) with propofol (N/A, 04/24/2018); maloney dilation (N/A, 04/24/2018); biopsy (04/24/2018); polypectomy (04/24/2018); Colonoscopy with propofol (N/A, 09/21/2021); and polypectomy (09/21/2021).   Medications: She has a current medication list which includes the following prescription(s): acetaminophen, albuterol, albuterol, amlodipine, aspirin ec, bupropion, estradiol, fluoxetine, fluticasone-salmeterol, gabapentin, guaifenesin, ibuprofen, methocarbamol, olanzapine, olmesartan, omeprazole, prednisone, rosuvastatin, wegovy, tirzepatide, trazodone, trelegy ellipta, and incruse ellipta.   Allergies: Patient is allergic to tizanidine.   Social History: Patient  reports that she quit smoking about 12 years ago. Her smoking use included cigarettes. She has a 60.00 pack-year smoking history. She has never been exposed to tobacco smoke. She has never used smokeless tobacco. She reports that she does not drink alcohol and does not use drugs.      Objective:    Physical Exam: There were no vitals taken for this visit. Gen: No  apparent distress, A&O x 3. Detailed Urogynecologic Evaluation:  Pelvic Exam: Normal external female genitalia; Bartholin's and Skene's glands normal in appearance; urethral meatus {urethra:24773}, no urethral masses or discharge. The pessary was noted to be {in place:24774}. It was removed and cleaned. Speculum exam revealed {vaginal lesions:24775} in the vagina. The pessary was replaced. It was comfortable to the patient and fit well.       No data to display          Laboratory Results: Urine dipstick shows: {ua dip:315374::"negative for all components"}.    Assessment/Plan:    Assessment: Ms. Lamia is a 67 y.o. with {PFD symptoms:24771} here for a pessary check. She is doing well.  Plan: She will {pessary plan:24776}. She will continue to use {lubricant:24777}. She will follow-up in *** {days/wks/mos/yrs:310907} for a pessary check or sooner as needed.  All questions were answered.   Time Spent:

## 2022-07-22 ENCOUNTER — Ambulatory Visit: Payer: Medicaid Other | Admitting: Obstetrics and Gynecology

## 2022-07-23 NOTE — Telephone Encounter (Signed)
Please advise. Dont see anything noted of what's covered?   Route back to triage

## 2022-07-23 NOTE — Telephone Encounter (Signed)
PT calling again to check the status of the PA Teams findings. Pls call @ [phone]

## 2022-07-23 NOTE — Telephone Encounter (Signed)
Please see previous notation of preferred/covered alternative

## 2022-07-28 NOTE — Progress Notes (Unsigned)
[facility]   Subjective:     Chief Complaint: No chief complaint on file.  History of Present Illness: Monica Scott is a 67 y.o. female with stage III pelvic organ prolapse who presents for a pessary check. She is using a size #3 ring with support pessary. The pessary has been working well and she has no complaints. She {ACTION; IS/IS ONG:[id number]} using vaginal estrogen. She denies vaginal bleeding.  Past Medical History: Patient  has a past medical history of Arthritis, Asthma, Bladder disease, COPD (chronic obstructive pulmonary disease) (HCC), Depression, GERD (gastroesophageal reflux disease), Hyperlipidemia, Hypertension, Peptic ulcer, and Urinary retention.   Past Surgical History: She  has a past surgical history that includes Vaginal hysterectomy; Pilonidal cyst excision; Colonoscopy with propofol (N/A, 04/24/2018); Esophagogastroduodenoscopy (egd) with propofol (N/A, 04/24/2018); maloney dilation (N/A, 04/24/2018); biopsy (04/24/2018); polypectomy (04/24/2018); Colonoscopy with propofol (N/A, 09/21/2021); and polypectomy (09/21/2021).   Medications: She has a current medication list which includes the following prescription(s): acetaminophen, albuterol, albuterol, amlodipine, aspirin ec, bupropion, estradiol, fluoxetine, fluticasone-salmeterol, gabapentin, guaifenesin, ibuprofen, methocarbamol, olanzapine, olmesartan, omeprazole, prednisone, rosuvastatin, wegovy, tirzepatide, trazodone, trelegy ellipta, and incruse ellipta.   Allergies: Patient is allergic to tizanidine.   Social History: Patient  reports that she quit smoking about 12 years ago. Her smoking use included cigarettes. She has a 60.00 pack-year smoking history. She has never been exposed to tobacco smoke. She has never used smokeless tobacco. She reports that she does not drink alcohol and does not use drugs.      Objective:    Physical Exam: There were no vitals taken for this visit. Gen: No  apparent distress, A&O x 3. Detailed Urogynecologic Evaluation:  Pelvic Exam: Normal external female genitalia; Bartholin's and Skene's glands normal in appearance; urethral meatus {urethra:24773}, no urethral masses or discharge. The pessary was noted to be {in place:24774}. It was removed and cleaned. Speculum exam revealed {vaginal lesions:24775} in the vagina. The pessary was replaced. It was comfortable to the patient and fit well.       No data to display          Laboratory Results: Urine dipstick shows: {ua dip:315374::"negative for all components"}.    Assessment/Plan:    Assessment: Monica Scott is a 67 y.o. with stage III pelvic organ prolapse here for a pessary check. She is doing well.  Plan: She will keep the pessary in place until next visit. She will continue to use {lubricant:24777}. She will follow-up in *** {days/wks/mos/yrs:310907} for a pessary check or sooner as needed.  All questions were answered.   Time Spent:

## 2022-07-29 ENCOUNTER — Encounter: Payer: Self-pay | Admitting: Obstetrics and Gynecology

## 2022-07-29 ENCOUNTER — Ambulatory Visit: Payer: Medicare (Managed Care) | Admitting: Obstetrics and Gynecology

## 2022-07-29 ENCOUNTER — Ambulatory Visit
Admission: RE | Admit: 2022-07-29 | Discharge: 2022-07-29 | Disposition: A | Discharge status: Home / Self Care | Payer: Medicare (Managed Care) | Source: Ambulatory Visit | Attending: Nurse Practitioner | Admitting: Nurse Practitioner

## 2022-07-29 VITALS — BP 124/79 | HR 89

## 2022-07-29 DIAGNOSIS — Z1231 Encounter for screening mammogram for malignant neoplasm of breast: Secondary | ICD-10-CM

## 2022-07-29 DIAGNOSIS — N952 Postmenopausal atrophic vaginitis: Secondary | ICD-10-CM | POA: Diagnosis not present

## 2022-07-29 DIAGNOSIS — N993 Prolapse of vaginal vault after hysterectomy: Secondary | ICD-10-CM | POA: Diagnosis not present

## 2022-07-29 DIAGNOSIS — N8110 Cystocele, unspecified: Secondary | ICD-10-CM

## 2022-07-29 DIAGNOSIS — Z4689 Encounter for fitting and adjustment of other specified devices: Secondary | ICD-10-CM

## 2022-07-29 NOTE — Patient Instructions (Signed)
Please have your Pulmonologist send [place] a letter with the recommendation [phone]  Continue with estrogen cream x2 weekly.

## 2022-08-02 ENCOUNTER — Other Ambulatory Visit: Payer: Self-pay | Admitting: Pulmonary Disease

## 2022-08-02 MED ORDER — FLUTICASONE-SALMETEROL 250-50 MCG/ACT IN AEPB: 1.0000 | INHALATION_SPRAY | Freq: Two times a day (BID) | RESPIRATORY_TRACT | 0 refills | Status: DC

## 2022-08-02 NOTE — Telephone Encounter (Signed)
Called and spoke with patient. Patient verbalized understanding. Rx for wixela has been sent to pharmacy.   Nothing further needed.

## 2022-08-17 ENCOUNTER — Emergency Department (HOSPITAL_BASED_OUTPATIENT_CLINIC_OR_DEPARTMENT_OTHER)
Admission: EM | Admit: 2022-08-17 | Discharge: 2022-08-17 | Disposition: A | Discharge status: Home / Self Care | Payer: Medicare (Managed Care) | Attending: Emergency Medicine | Admitting: Emergency Medicine

## 2022-08-17 ENCOUNTER — Encounter (HOSPITAL_BASED_OUTPATIENT_CLINIC_OR_DEPARTMENT_OTHER): Payer: Self-pay | Admitting: Emergency Medicine

## 2022-08-17 ENCOUNTER — Emergency Department (HOSPITAL_BASED_OUTPATIENT_CLINIC_OR_DEPARTMENT_OTHER): Payer: Medicare (Managed Care)

## 2022-08-17 ENCOUNTER — Other Ambulatory Visit: Payer: Self-pay

## 2022-08-17 ENCOUNTER — Other Ambulatory Visit (HOSPITAL_BASED_OUTPATIENT_CLINIC_OR_DEPARTMENT_OTHER): Payer: Self-pay

## 2022-08-17 ENCOUNTER — Emergency Department (HOSPITAL_BASED_OUTPATIENT_CLINIC_OR_DEPARTMENT_OTHER): Payer: Medicare (Managed Care) | Admitting: Radiology

## 2022-08-17 DIAGNOSIS — E876 Hypokalemia: Secondary | ICD-10-CM | POA: Insufficient documentation

## 2022-08-17 DIAGNOSIS — R0609 Other forms of dyspnea: Secondary | ICD-10-CM | POA: Diagnosis not present

## 2022-08-17 DIAGNOSIS — R911 Solitary pulmonary nodule: Secondary | ICD-10-CM | POA: Insufficient documentation

## 2022-08-17 DIAGNOSIS — I10 Essential (primary) hypertension: Secondary | ICD-10-CM | POA: Insufficient documentation

## 2022-08-17 DIAGNOSIS — I2510 Atherosclerotic heart disease of native coronary artery without angina pectoris: Secondary | ICD-10-CM | POA: Insufficient documentation

## 2022-08-17 DIAGNOSIS — J441 Chronic obstructive pulmonary disease with (acute) exacerbation: Secondary | ICD-10-CM | POA: Diagnosis not present

## 2022-08-17 DIAGNOSIS — R0602 Shortness of breath: Secondary | ICD-10-CM | POA: Diagnosis present

## 2022-08-17 LAB — CBC WITH DIFFERENTIAL/PLATELET
Abs Immature Granulocytes: 0.03 10*3/uL (ref 0.00–0.07)
Basophils Absolute: 0.1 10*3/uL (ref 0.0–0.1)
Basophils Relative: 1 %
Eosinophils Absolute: 0.2 10*3/uL (ref 0.0–0.5)
Eosinophils Relative: 3 %
HCT: 36.8 % (ref 36.0–46.0)
Hemoglobin: 12 g/dL (ref 12.0–15.0)
Immature Granulocytes: 0 %
Lymphocytes Relative: 31 %
Lymphs Abs: 2.6 10*3/uL (ref 0.7–4.0)
MCH: 29.9 pg (ref 26.0–34.0)
MCHC: 32.6 g/dL (ref 30.0–36.0)
MCV: 91.8 fL (ref 80.0–100.0)
Monocytes Absolute: 0.6 10*3/uL (ref 0.1–1.0)
Monocytes Relative: 7 %
Neutro Abs: 4.9 10*3/uL (ref 1.7–7.7)
Neutrophils Relative %: 58 %
Platelets: 383 10*3/uL (ref 150–400)
RBC: 4.01 MIL/uL (ref 3.87–5.11)
RDW: 14.4 % (ref 11.5–15.5)
WBC: 8.4 10*3/uL (ref 4.0–10.5)
nRBC: 0 % (ref 0.0–0.2)

## 2022-08-17 LAB — COMPREHENSIVE METABOLIC PANEL
ALT: 14 U/L (ref 0–44)
AST: 17 U/L (ref 15–41)
Albumin: 4.1 g/dL (ref 3.5–5.0)
Alkaline Phosphatase: 76 U/L (ref 38–126)
Anion gap: 13 (ref 5–15)
BUN: 6 mg/dL — ABNORMAL LOW (ref 8–23)
CO2: 23 mmol/L (ref 22–32)
Calcium: 8.9 mg/dL (ref 8.9–10.3)
Chloride: 104 mmol/L (ref 98–111)
Creatinine, Ser: 0.95 mg/dL (ref 0.44–1.00)
GFR, Estimated: >60 mL/min (ref >60)
Glucose, Bld: 167 mg/dL — ABNORMAL HIGH (ref 70–99)
Potassium: 3.1 mmol/L — ABNORMAL LOW (ref 3.5–5.1)
Sodium: 140 mmol/L (ref 135–145)
Total Bilirubin: 0.4 mg/dL (ref 0.3–1.2)
Total Protein: 6.2 g/dL — ABNORMAL LOW (ref 6.5–8.1)

## 2022-08-17 LAB — TROPONIN I (HIGH SENSITIVITY)
Troponin I (High Sensitivity): <2 ng/L (ref <18)
Troponin I (High Sensitivity): 3 ng/L (ref <18)

## 2022-08-17 LAB — BRAIN NATRIURETIC PEPTIDE: B Natriuretic Peptide: 17.2 pg/mL (ref 0.0–100.0)

## 2022-08-17 MED ORDER — IPRATROPIUM-ALBUTEROL 0.5-2.5 (3) MG/3ML IN SOLN
3.0000 mL | Freq: Once | RESPIRATORY_TRACT | Status: AC
  Administered 2022-08-17: 3 mL via RESPIRATORY_TRACT
  Filled 2022-08-17: qty 3

## 2022-08-17 MED ORDER — POTASSIUM CHLORIDE CRYS ER 20 MEQ PO TBCR
40.0000 meq | EXTENDED_RELEASE_TABLET | Freq: Once | ORAL | Status: AC
  Administered 2022-08-17: 40 meq via ORAL
  Filled 2022-08-17: qty 2

## 2022-08-17 MED ORDER — IOHEXOL 350 MG/ML SOLN
100.0000 mL | Freq: Once | INTRAVENOUS | Status: AC | PRN
  Administered 2022-08-17: 60 mL via INTRAVENOUS

## 2022-08-17 MED ORDER — PREDNISONE 50 MG PO TABS
60.0000 mg | ORAL_TABLET | Freq: Once | ORAL | Status: AC
  Administered 2022-08-17: 60 mg via ORAL
  Filled 2022-08-17: qty 1

## 2022-08-17 MED ORDER — PREDNISONE 10 MG PO TABS
40.0000 mg | ORAL_TABLET | Freq: Every day | ORAL | 0 refills | Status: AC
  Filled 2022-08-17: qty 16, 4d supply, fill #0

## 2022-08-17 NOTE — ED Provider Notes (Signed)
[facility] Provider Note   CSN: [id number] Arrival date & time: 08/17/22  7829     History  Chief Complaint  Patient presents with   Shortness of Breath    Monica Scott is a 67 y.o. female.  HPI     67 year old female with a history of COPD on 3 L of oxygen at home, hypertension, hyperlipidemia who presents with concern for shortness of breath.  Reports that the shortness of breath has been progressive over the last several weeks, but worse last night.  Reports chronic orthopnea which is unchanged.  Denies leg swelling or pain, cough, fever, chest pain, abdominal pain.  This shortness of breath is worse on exertion, and becoming severe.   Past Medical History:  Diagnosis Date   Arthritis    Asthma    Bladder disease    COPD (chronic obstructive pulmonary disease) (HCC)    Depression    GERD (gastroesophageal reflux disease)    Hyperlipidemia    Hypertension    Peptic ulcer    Urinary retention     Home Medications Prior to Admission medications   Medication Sig Start Date End Date Taking? Authorizing Provider  predniSONE (DELTASONE) 10 MG tablet Take 4 tablets (40 mg total) by mouth daily for 4 days. 08/17/22 08/21/22 Yes Alvira Monday, MD  acetaminophen (TYLENOL) 325 MG tablet Take 650 mg by mouth every 6 (six) hours as needed for mild pain.    [provider]  albuterol (ACCUNEB) 0.63 MG/3ML nebulizer solution Take 3 mLs (0.63 mg total) by nebulization every 6 (six) hours as needed for wheezing. 01/21/22   Oretha Milch, MD  albuterol (VENTOLIN HFA) 108 (90 Base) MCG/ACT inhaler INHALE 1-2 PUFFS EVERY 6 HOURS AS NEEDED FOR WHEEZE OR SHORTNESS OF BREATH 05/21/22   Oretha Milch, MD  amLODipine (NORVASC) 5 MG tablet Take 1 tablet (5 mg total) by mouth daily. 04/13/22   Claiborne Rigg, NP  aspirin EC 81 MG tablet Take 1 tablet (81 mg total) by mouth daily. Swallow whole. 02/22/22   Jodelle Red, MD   budesonide-formoterol [facility]) 80-4.5 MCG/ACT inhaler Inhale 2 puffs into the lungs 2 (two) times daily. 08/03/22   Oretha Milch, MD  buPROPion (WELLBUTRIN XL) 150 MG 24 hr tablet Take 1 tablet (150 mg total) by mouth at bedtime. 04/13/22   Claiborne Rigg, NP  estradiol (ESTRING) 7.5 MCG/24HR vaginal ring Place 1 each vaginally every 3 (three) months. 04/22/22   Selmer Dominion, NP  FLUoxetine (PROZAC) 20 MG capsule Take 60 mg by mouth every morning. 01/20/22   [provider]  gabapentin (NEURONTIN) 400 MG capsule Take 1 capsule (400 mg total) by mouth 3 (three) times daily. Patient taking differently: Take 800 mg by mouth at bedtime. 09/07/21   Claiborne Rigg, NP  guaiFENesin (MUCINEX) 600 MG 12 hr tablet Take 1 tablet (600 mg total) by mouth 2 (two) times daily. 07/13/22   Claiborne Rigg, NP  ibuprofen (ADVIL,MOTRIN) 200 MG tablet Take 400 mg by mouth daily as needed for moderate pain.    [provider]  methocarbamol (ROBAXIN) 500 MG tablet TAKE 1 TABLET (500 MG TOTAL) BY MOUTH 2 (TWO) TIMES DAILY AS NEEDED FOR MUSCLE SPASMS. 06/30/22   Hoy Register, MD  OLANZapine (ZYPREXA) 10 MG tablet Take 10 mg by mouth at bedtime.     [provider]  olmesartan (BENICAR) 40 MG tablet TAKE 1 TABLET BY MOUTH EVERY DAY  06/07/22   Hoy Register, MD  omeprazole (PRILOSEC) 40 MG capsule TAKE 1 CAPSULE (40 MG TOTAL) BY MOUTH IN THE MORNING AND AT BEDTIME. 06/07/22   Hoy Register, MD  rosuvastatin (CRESTOR) 40 MG tablet Take 1 tablet (40 mg total) by mouth daily. 03/16/22 03/11/23  Alver Sorrow, NP  Semaglutide-Weight Management (WEGOVY) 0.25 MG/0.5ML SOAJ Inject 0.25 mg into the skin once a week. 07/13/22   Claiborne Rigg, NP  tirzepatide [facility]) 2.5 MG/0.5ML Pen Inject 2.5 mg into the skin once a week. 07/13/22   Claiborne Rigg, NP  traZODone (DESYREL) 100 MG tablet Take 100 mg by mouth at bedtime. 01/20/22   [provider]  Dwyane Luo 100-62.5-25  MCG/ACT AEPB TAKE 1 PUFF BY MOUTH EVERY DAY 03/24/22   Oretha Milch, MD  umeclidinium bromide (INCRUSE ELLIPTA) 62.5 MCG/ACT AEPB Inhale 1 puff into the lungs daily. 07/13/22   Oretha Milch, MD      Allergies    Tizanidine    Review of Systems   Review of Systems  Physical Exam Updated Vital Signs BP (!) 131/90   Pulse 80   Temp 98.2 F (36.8 C) (Oral)   Resp 20   SpO2 94%  Physical Exam Vitals and nursing note reviewed.  Constitutional:      General: She is not in acute distress.    Appearance: She is well-developed. She is not diaphoretic.  HENT:     Head: Normocephalic and atraumatic.  Eyes:     Conjunctiva/sclera: Conjunctivae normal.  Cardiovascular:     Rate and Rhythm: Normal rate and regular rhythm.     Heart sounds: Normal heart sounds. No murmur heard.    No friction rub. No gallop.  Pulmonary:     Effort: Pulmonary effort is normal. No respiratory distress.     Breath sounds: Normal breath sounds. No wheezing or rales.  Abdominal:     General: There is no distension.     Palpations: Abdomen is soft.     Tenderness: There is no abdominal tenderness. There is no guarding.  Musculoskeletal:        General: No tenderness.     Cervical back: Normal range of motion.  Skin:    General: Skin is warm and dry.     Findings: No erythema or rash.  Neurological:     Mental Status: She is alert and oriented to person, place, and time.     ED Results / Procedures / Treatments   Labs (all labs ordered are listed, but only abnormal results are displayed) Labs Reviewed  COMPREHENSIVE METABOLIC PANEL - Abnormal; Notable for the following components:      Result Value   Potassium 3.1 (*)    Glucose, Bld 167 (*)    BUN 6 (*)    Total Protein 6.2 (*)    All other components within normal limits  CBC WITH DIFFERENTIAL/PLATELET  BRAIN NATRIURETIC PEPTIDE  TROPONIN I (HIGH SENSITIVITY)  TROPONIN I (HIGH SENSITIVITY)    EKG EKG  Interpretation  Date/Time:  Tuesday Aug 17 2022 08:18:53 EDT Ventricular Rate:  97 PR Interval:  129 QRS Duration: 81 QT Interval:  329 QTC Calculation: 418 R Axis:   71 Text Interpretation: Sinus rhythm Low voltage, precordial leads No significant change since last tracing Confirmed by Alvira Monday ([id number]) on 08/17/2022 8:24:18 AM  Radiology CT Angio Chest PE W and/or Wo Contrast  Result Date: 08/17/2022 CLINICAL DATA:  Hypoxia, shortness of breath EXAM: CT ANGIOGRAPHY CHEST  WITH CONTRAST TECHNIQUE: Multidetector CT imaging of the chest was performed using the standard protocol during bolus administration of intravenous contrast. Multiplanar CT image reconstructions and MIPs were obtained to evaluate the vascular anatomy. RADIATION DOSE REDUCTION: This exam was performed according to the departmental dose-optimization program which includes automated exposure control, adjustment of the mA and/or kV according to patient size and/or use of iterative reconstruction technique. CONTRAST:  60mL OMNIPAQUE IOHEXOL 350 MG/ML SOLN COMPARISON:  CT done on 10/08/2021 FINDINGS: Cardiovascular: There are no intraluminal filling defects in pulmonary artery branches. There is homogeneous enhancement in thoracic aorta. Ascending thoracic aorta measures 3.4 cm. There are scattered coronary artery calcifications. Scattered calcifications and plaques are seen in thoracic aorta. Minimal pericardial effusion is present. Mediastinum/Nodes: There is nodular density in the subcarinal region measuring 1.6 cm in diameter which was not distinctly seen on the previous study. Density measurements are less than 10 Hounsfield units. Subcentimeter nodes are seen in both hilar regions. Lungs/Pleura: There are small linear densities in both mid and both lower lung fields. There is no focal pulmonary consolidation. There is no pleural effusion or pneumothorax. Upper Abdomen: No acute findings are seen. Musculoskeletal: Unremarkable.  Review of the MIP images confirms the above findings. IMPRESSION: There is no evidence of pulmonary artery embolism. There is no evidence of thoracic aortic dissection. Coronary artery calcifications are seen. Aortic atherosclerosis. Minimal pericardial effusion. There is 2.3 x 1.6 cm smooth marginated low-density structure in subcarinal region of the mediastinum which was not distinctly seen on the previous study. Short-term follow-up CT in 3 months may be considered. There are small linear densities in mid and lower lung fields on both sides suggesting scarring or subsegmental atelectasis. There is no focal pulmonary consolidation. There is no pleural effusion or pneumothorax. Electronically Signed   By: Ernie Avena M.D.   On: 08/17/2022 10:17   DG Chest 2 View  Result Date: 08/17/2022 CLINICAL DATA:  Shortness of breath EXAM: CHEST - 2 VIEW COMPARISON:  01/21/2022 FINDINGS: The heart size and mediastinal contours are within normal limits. Hyperinflated lungs. No focal airspace consolidation, pleural effusion, or pneumothorax. The visualized skeletal structures are unremarkable. IMPRESSION: No active cardiopulmonary disease. Electronically Signed   By: Duanne Guess D.O.   On: 08/17/2022 08:59    Procedures Procedures    Medications Ordered in ED Medications  ipratropium-albuterol (DUONEB) 0.5-2.5 (3) MG/3ML nebulizer solution 3 mL (3 mLs Nebulization Given 08/17/22 0910)  iohexol (OMNIPAQUE) 350 MG/ML injection 100 mL (60 mLs Intravenous Contrast Given 08/17/22 0950)  potassium chloride SA (KLOR-CON M) CR tablet 40 mEq (40 mEq Oral Given 08/17/22 1003)  predniSONE (DELTASONE) tablet 60 mg (60 mg Oral Given 08/17/22 1230)    ED Course/ Medical Decision Making/ A&P                              67 year old female with a history of COPD on 3 L of oxygen at home, hypertension, hyperlipidemia who presents with concern for shortness of breath.   Differential diagnosis for dyspnea includes  ACS, PE, COPD exacerbation, CHF exacerbation, anemia, pneumonia, viral etiology such as COVID 19 infection, metabolic abnormality.    Chest x-ray was done and personally interpreted by me which showed no sign of pneumonia, pneumothorax or pulmonary edema.   EKG was evaluated by me which showed normal rhythm, no acute ST changes.  Labs completed and personally about interpreted by me show no anemia, no leukocytosis.  CMP with mild hypokalemia, no other clinically significant electrolyte abnormalities.  Troponin is negative x2 and have low suspicion for ACS.  BNP is within normal limits, and doubt CHF exacerbation.  Given history of dyspnea, tachypnea, no clear wheezing on exam as explanation, CT PE study ordered and shows no evidence of PE, does show coronary calcifications, small effusion, and a lesion for which we recommend 3 month follow up.  Reports improvement with breathing treatments. Suspect COPD exacerbation, will treat with prednisone, rec pulmonology follow up. -Also recommend follow up with Cardiology given exertional dyspnea, CAD on CT.         Final Clinical Impression(s) / ED Diagnoses Final diagnoses:  COPD exacerbation (HCC)  Dyspnea on exertion  Coronary artery calcification seen on CT scan  Pulmonary nodule    Rx / DC Orders ED Discharge Orders          Ordered    predniSONE (DELTASONE) 10 MG tablet  Daily        08/17/22 1215    Ambulatory referral to Cardiology        08/17/22 1220              Alvira Monday, MD 08/18/22 0002

## 2022-08-17 NOTE — ED Notes (Signed)
Pt discharged to home using teachback Method. Discharge instructions have been discussed with patient and/or family members. Pt verbally acknowledges understanding d/c instructions, has been given opportunity for questions to be answered, and endorses comprehension to checkout at registration before leaving.  

## 2022-08-17 NOTE — ED Notes (Signed)
Pt ambulatory with steady gait, will provide urine specimen

## 2022-08-17 NOTE — ED Notes (Signed)
Robin, RT aware of Duoneb

## 2022-08-17 NOTE — ED Triage Notes (Signed)
Pt POV from home on home O2 at 3L, reports hx COPD and always on 3L at home. Pt c/o increased SOB on exertion over the past week. Pt denies N/V/D, pain, fever, cough. Pt denies hx CHF and PE.

## 2022-08-17 NOTE — ED Notes (Signed)
Pt returned from CT °

## 2022-08-19 ENCOUNTER — Inpatient Hospital Stay: Admission: RE | Admit: 2022-08-19 | Payer: Medicare (Managed Care) | Source: Ambulatory Visit

## 2022-09-05 ENCOUNTER — Other Ambulatory Visit: Payer: Self-pay | Admitting: Family Medicine

## 2022-09-05 DIAGNOSIS — G2581 Restless legs syndrome: Secondary | ICD-10-CM

## 2022-09-09 ENCOUNTER — Encounter: Payer: Self-pay | Admitting: Adult Health

## 2022-09-09 ENCOUNTER — Ambulatory Visit (INDEPENDENT_AMBULATORY_CARE_PROVIDER_SITE_OTHER): Payer: Medicare (Managed Care) | Admitting: Adult Health

## 2022-09-09 VITALS — BP 118/74 | HR 100 | Temp 97.9°F | Ht <= 58 in | Wt 153.0 lb

## 2022-09-09 DIAGNOSIS — J9611 Chronic respiratory failure with hypoxia: Secondary | ICD-10-CM | POA: Diagnosis not present

## 2022-09-09 DIAGNOSIS — J441 Chronic obstructive pulmonary disease with (acute) exacerbation: Secondary | ICD-10-CM

## 2022-09-09 DIAGNOSIS — R9389 Abnormal findings on diagnostic imaging of other specified body structures: Secondary | ICD-10-CM | POA: Diagnosis not present

## 2022-09-09 MED ORDER — PREDNISONE 10 MG PO TABS
ORAL_TABLET | ORAL | 0 refills | Status: DC
Start: 2022-09-09 — End: 2022-10-13

## 2022-09-09 NOTE — Assessment & Plan Note (Signed)
Recent CT chest showed possible subcarinal adenopathy measuring 2.3 x 1.6cm .  Follow-up CT chest next month as planned  Plan m Patient Instructions  Prednisone taper over next week.  Continue on Symbicort and Incruse  Albuterol Neb As needed   Continue on Oxygen Goal is to keep sats >88-90% Activity as tolerated  CT chest next month as planned  Follow up with Dr. Vassie Loll in 2  months and As needed  Please contact office for sooner follow up if symptoms do not improve or worsen or seek emergency care

## 2022-09-09 NOTE — Progress Notes (Signed)
@Patient  ID: Monica Scott, female    DOB: 1955/06/25, 67 y.o.   MRN: [id number]  Chief Complaint  Patient presents with   Follow-up    Referring provider: Claiborne Rigg, NP  HPI: 67 year old female followed for COPD with chronic respiratory failure on oxygen (since 2019), mild obstructive sleep apnea-declines CPAP Medical history significant for neurogenic bladder with self-catheterization  TEST/EVENTS :  PFTs 09/2019 -severe airway obstruction, ratio 51, FEV1 46%, no bronchodilator response, TLC 138% showing hyperinflation, DLCO 73%     LDCT 09/2021 >> RADS-2  LDCT 02/2020 RADS 1, no suspicious nodules noted.,  Emphysema   CT chest 04/2018 shows changes of emphysema, mild ectasia of thoracic aorta 3.5 cm   2D echo February 13, 2019 showed a normal EF at 60 to 65%.    N PSG was done on February 13, 2019 that showed mild obstructive sleep apnea with AHI at 7.6 and SPO2 low at 73%.      Alpha-1 antitrypsin-MM, 139  09/09/2022 Acute OV : COPD  Patient presents for an acute.  She complains of chest 2 months she has had increased shortness of breath and cough.  She was seen in the emergency room on the 21st 2024.  CT chest was negative for PE.  Showed a 2.3 x 1.6 cm structure in the subcarinal region of the mediastinum.  Recommended for short-term follow-up.  She has a CT chest planned for next month October 11, 2022. Remains on Symbicort and Incruse. She was treated with prednisone for 4 days.  She says she did have some improvement but continues to have ongoing symptoms.  She denies any fever or discolored mucus, hemoptysis or calf pain.  No edema..  Remains on oxygen, went up from 3 L to 4 L due to shortness of breath.    Allergies  Allergen Reactions   Tizanidine Other (See Comments)    Caused dizziness and dry mouth. Patient prefers not to take this again.    Immunization History  Administered Date(s) Administered   Fluad Quad(high Dose 65+) 02/06/2021    Influenza,inj,Quad PF,6+ Mos 12/06/2017, 11/28/2018, 01/03/2020, 01/27/2022   PFIZER(Purple Top)SARS-COV-2 Vaccination 06/24/2019, 07/08/2019, 03/07/2020   PNEUMOCOCCAL CONJUGATE-20 03/09/2021   Tdap 01/07/2021   Zoster Recombinat (Shingrix) 09/07/2021, 04/13/2022    Past Medical History:  Diagnosis Date   Arthritis    Asthma    Bladder disease    COPD (chronic obstructive pulmonary disease) (HCC)    Depression    GERD (gastroesophageal reflux disease)    Hyperlipidemia    Hypertension    Peptic ulcer    Urinary retention     Tobacco History: Social History   Tobacco Use  Smoking Status Former   Packs/day: 1.50   Years: 40.00   Additional pack years: 0.00   Total pack years: 60.00   Types: Cigarettes   Quit date: 04/19/2010   Years since quitting: 12.4   Passive exposure: Never  Smokeless Tobacco Never   Counseling given: Not Answered   Outpatient Medications Prior to Visit  Medication Sig Dispense Refill   acetaminophen (TYLENOL) 325 MG tablet Take 650 mg by mouth every 6 (six) hours as needed for mild pain.     albuterol (VENTOLIN HFA) 108 (90 Base) MCG/ACT inhaler INHALE 1-2 PUFFS EVERY 6 HOURS AS NEEDED FOR WHEEZE OR SHORTNESS OF BREATH 8.5 each 0   amLODipine (NORVASC) 5 MG tablet Take 1 tablet (5 mg total) by mouth daily. 90 tablet 1   aspirin EC 81 MG  tablet Take 1 tablet (81 mg total) by mouth daily. Swallow whole. 90 tablet 3   budesonide-formoterol (BREYNA) 80-4.5 MCG/ACT inhaler Inhale 2 puffs into the lungs 2 (two) times daily. 1 each 5   buPROPion (WELLBUTRIN XL) 150 MG 24 hr tablet Take 1 tablet (150 mg total) by mouth at bedtime. 90 tablet 1   estradiol (ESTRING) 7.5 MCG/24HR vaginal ring Place 1 each vaginally every 3 (three) months. 1 each 3   FLUoxetine (PROZAC) 20 MG capsule Take 60 mg by mouth every morning.     gabapentin (NEURONTIN) 400 MG capsule Take 1 capsule (400 mg total) by mouth 3 (three) times daily. (Patient taking differently: Take 800  mg by mouth at bedtime.) 90 capsule 6   guaiFENesin (MUCINEX) 600 MG 12 hr tablet Take 1 tablet (600 mg total) by mouth 2 (two) times daily. 30 tablet 1   ibuprofen (ADVIL,MOTRIN) 200 MG tablet Take 400 mg by mouth daily as needed for moderate pain.     ipratropium-albuterol (DUONEB) 0.5-2.5 (3) MG/3ML SOLN SMARTSIG:1 Ampule(s) Via Nebulizer Every 6 Hours PRN     methocarbamol (ROBAXIN) 500 MG tablet TAKE 1 TABLET (500 MG TOTAL) BY MOUTH 2 (TWO) TIMES DAILY AS NEEDED FOR MUSCLE SPASMS. 40 tablet 0   OLANZapine (ZYPREXA) 10 MG tablet Take 10 mg by mouth at bedtime.      olmesartan (BENICAR) 40 MG tablet TAKE 1 TABLET BY MOUTH EVERY DAY 90 tablet 0   omeprazole (PRILOSEC) 40 MG capsule TAKE 1 CAPSULE (40 MG TOTAL) BY MOUTH IN THE MORNING AND AT BEDTIME. 180 capsule 0   rosuvastatin (CRESTOR) 40 MG tablet Take 1 tablet (40 mg total) by mouth daily. 90 tablet 3   traZODone (DESYREL) 100 MG tablet Take 100 mg by mouth at bedtime.     umeclidinium bromide (INCRUSE ELLIPTA) 62.5 MCG/ACT AEPB Inhale 1 puff into the lungs daily. 30 each 3   Semaglutide-Weight Management (WEGOVY) 0.25 MG/0.5ML SOAJ Inject 0.25 mg into the skin once a week. (Patient not taking: Reported on 09/09/2022) 2 mL 1   tirzepatide (MOUNJARO) 2.5 MG/0.5ML Pen Inject 2.5 mg into the skin once a week. (Patient not taking: Reported on 09/09/2022) 2 mL 1   TRELEGY ELLIPTA 100-62.5-25 MCG/ACT AEPB TAKE 1 PUFF BY MOUTH EVERY DAY (Patient not taking: Reported on 09/09/2022) 60 each 5   albuterol (ACCUNEB) 0.63 MG/3ML nebulizer solution Take 3 mLs (0.63 mg total) by nebulization every 6 (six) hours as needed for wheezing. (Patient not taking: Reported on 09/09/2022) 75 mL 12   No facility-administered medications prior to visit.     Review of Systems:   Constitutional:   No  weight loss, night sweats,  Fevers, chills,  +fatigue, or  lassitude.  HEENT:   No headaches,  Difficulty swallowing,  Tooth/dental problems, or  Sore throat,                 No sneezing, itching, ear ache, nasal congestion, post nasal drip,   CV:  No chest pain,  Orthopnea, PND, swelling in lower extremities, anasarca, dizziness, palpitations, syncope.   GI  No heartburn, indigestion, abdominal pain, nausea, vomiting, diarrhea, change in bowel habits, loss of appetite, bloody stools.   Resp:   No chest wall deformity  Skin: no rash or lesions.  GU: no dysuria, change in color of urine, no urgency or frequency.  No flank pain, no hematuria   MS:  No joint pain or swelling.  No decreased range of motion.  No  back pain.    Physical Exam  BP 118/74 (BP Location: Left Arm, Patient Position: Sitting, Cuff Size: Large)   Pulse 100   Temp 97.9 F (36.6 C) (Temporal)   Ht 4\' 9"  (1.448 m)   Wt 153 lb (69.4 kg)   SpO2 95%   BMI 33.11 kg/m   GEN: A/Ox3; pleasant , NAD, well nourished    HEENT:  [place]/AT,  NOSE-clear, THROAT-clear, no lesions, no postnasal drip or exudate noted.   NECK:  Supple w/ fair ROM; no JVD; normal carotid impulses w/o bruits; no thyromegaly or nodules palpated; no lymphadenopathy.    RESP  Clear  P & A; w/o, wheezes/ rales/ or rhonchi. no accessory muscle use, no dullness to percussion  CARD:  RRR, no m/r/g, no peripheral edema, pulses intact, no cyanosis or clubbing.  GI:   Soft & nt; nml bowel sounds; no organomegaly or masses detected.   Musco: Warm bil, no deformities or joint swelling noted.   Neuro: alert, no focal deficits noted.    Skin: Warm, no lesions or rashes    Lab Results:  CBC    Component Value Date/Time   WBC 8.4 08/17/2022 0827   RBC 4.01 08/17/2022 0827   HGB 12.0 08/17/2022 0827   HGB 12.0 07/13/2022 0946   HCT 36.8 08/17/2022 0827   HCT 36.2 07/13/2022 0946   PLT 383 08/17/2022 0827   PLT 425 07/13/2022 0946   MCV 91.8 08/17/2022 0827   MCV 89 07/13/2022 0946   MCH 29.9 08/17/2022 0827   MCHC 32.6 08/17/2022 0827   RDW 14.4 08/17/2022 0827   RDW 15.4 07/13/2022 0946   LYMPHSABS 2.6  08/17/2022 0827   LYMPHSABS 2.7 07/13/2022 0946   MONOABS 0.6 08/17/2022 0827   EOSABS 0.2 08/17/2022 0827   EOSABS 0.2 07/13/2022 0946   BASOSABS 0.1 08/17/2022 0827   BASOSABS 0.1 07/13/2022 0946    BMET    Component Value Date/Time   NA 140 08/17/2022 0827   NA 142 07/13/2022 0946   K 3.1 (L) 08/17/2022 0827   CL 104 08/17/2022 0827   CO2 23 08/17/2022 0827   GLUCOSE 167 (H) 08/17/2022 0827   BUN 6 (L) 08/17/2022 0827   BUN 6 (L) 07/13/2022 0946   CREATININE 0.95 08/17/2022 0827   CALCIUM 8.9 08/17/2022 0827   GFRNONAA >60 08/17/2022 0827   GFRAA 86 08/08/2019 1553    BNP    Component Value Date/Time   BNP 17.2 08/17/2022 0827    ProBNP    Component Value Date/Time   PROBNP 174.3 (H) 05/11/2013 2015    Imaging: CT Angio Chest PE W and/or Wo Contrast  Result Date: 08/17/2022 CLINICAL DATA:  Hypoxia, shortness of breath EXAM: CT ANGIOGRAPHY CHEST WITH CONTRAST TECHNIQUE: Multidetector CT imaging of the chest was performed using the standard protocol during bolus administration of intravenous contrast. Multiplanar CT image reconstructions and MIPs were obtained to evaluate the vascular anatomy. RADIATION DOSE REDUCTION: This exam was performed according to the departmental dose-optimization program which includes automated exposure control, adjustment of the mA and/or kV according to patient size and/or use of iterative reconstruction technique. CONTRAST:  60mL OMNIPAQUE IOHEXOL 350 MG/ML SOLN COMPARISON:  CT done on 10/08/2021 FINDINGS: Cardiovascular: There are no intraluminal filling defects in pulmonary artery branches. There is homogeneous enhancement in thoracic aorta. Ascending thoracic aorta measures 3.4 cm. There are scattered coronary artery calcifications. Scattered calcifications and plaques are seen in thoracic aorta. Minimal pericardial effusion is present. Mediastinum/Nodes: There is  nodular density in the subcarinal region measuring 1.6 cm in diameter which  was not distinctly seen on the previous study. Density measurements are less than 10 Hounsfield units. Subcentimeter nodes are seen in both hilar regions. Lungs/Pleura: There are small linear densities in both mid and both lower lung fields. There is no focal pulmonary consolidation. There is no pleural effusion or pneumothorax. Upper Abdomen: No acute findings are seen. Musculoskeletal: Unremarkable. Review of the MIP images confirms the above findings. IMPRESSION: There is no evidence of pulmonary artery embolism. There is no evidence of thoracic aortic dissection. Coronary artery calcifications are seen. Aortic atherosclerosis. Minimal pericardial effusion. There is 2.3 x 1.6 cm smooth marginated low-density structure in subcarinal region of the mediastinum which was not distinctly seen on the previous study. Short-term follow-up CT in 3 months may be considered. There are small linear densities in mid and lower lung fields on both sides suggesting scarring or subsegmental atelectasis. There is no focal pulmonary consolidation. There is no pleural effusion or pneumothorax. Electronically Signed   By: Ernie Avena M.D.   On: 08/17/2022 10:17   DG Chest 2 View  Result Date: 08/17/2022 CLINICAL DATA:  Shortness of breath EXAM: CHEST - 2 VIEW COMPARISON:  01/21/2022 FINDINGS: The heart size and mediastinal contours are within normal limits. Hyperinflated lungs. No focal airspace consolidation, pleural effusion, or pneumothorax. The visualized skeletal structures are unremarkable. IMPRESSION: No active cardiopulmonary disease. Electronically Signed   By: Duanne Guess D.O.   On: 08/17/2022 08:59         Latest Ref Rng & Units 10/03/2019    9:50 AM  PFT Results  FVC-Pre L 1.83  P  FVC-Predicted Pre % 68  P  FVC-Post L 1.83  P  FVC-Predicted Post % 68  P  Pre FEV1/FVC % % 51  P  Post FEV1/FCV % % 53  P  FEV1-Pre L 0.94  P  FEV1-Predicted Pre % 46  P  FEV1-Post L 0.97  P  DLCO uncorrected  ml/min/mmHg 12.48  P  DLCO UNC% % 73  P  DLCO corrected ml/min/mmHg 12.48  P  DLCO COR %Predicted % 73  P  DLVA Predicted % 82  P  TLC L 6.05  P  TLC % Predicted % 138  P  RV % Predicted % 215  P    P Preliminary result    No results found for: "NITRICOXIDE"      Assessment & Plan:   COPD (chronic obstructive pulmonary disease) (HCC) Prednisone COPD exacerbation.  Treated with a course of steroids over the next week.  Continue on maintenance regimen.  CT chest as planned in 1 month.  Plan  Patient Instructions  Prednisone taper over next week.  Continue on Symbicort and Incruse  Albuterol Neb As needed   Continue on Oxygen Goal is to keep sats >88-90% Activity as tolerated  CT chest next month as planned  Follow up with Dr. Vassie Loll in 2  months and As needed  Please contact office for sooner follow up if symptoms do not improve or worsen or seek emergency care       Chronic respiratory failure with hypoxia (HCC) Continue on oxygen to maintain O2 saturations greater than 88 to 90%  Abnormal CT of the chest Recent CT chest showed possible subcarinal adenopathy measuring 2.3 x 1.6cm .  Follow-up CT chest next month as planned  Plan m Patient Instructions  Prednisone taper over next week.  Continue on Symbicort and Incruse  Albuterol Neb As needed   Continue on Oxygen Goal is to keep sats >88-90% Activity as tolerated  CT chest next month as planned  Follow up with Dr. Vassie Loll in 2  months and As needed  Please contact office for sooner follow up if symptoms do not improve or worsen or seek emergency care         Rubye Oaks, NP 09/09/2022

## 2022-09-09 NOTE — Assessment & Plan Note (Signed)
Prednisone COPD exacerbation.  Treated with a course of steroids over the next week.  Continue on maintenance regimen.  CT chest as planned in 1 month.  Plan  Patient Instructions  Prednisone taper over next week.  Continue on Symbicort and Incruse  Albuterol Neb As needed   Continue on Oxygen Goal is to keep sats >88-90% Activity as tolerated  CT chest next month as planned  Follow up with Dr. Vassie Loll in 2  months and As needed  Please contact office for sooner follow up if symptoms do not improve or worsen or seek emergency care

## 2022-09-09 NOTE — Patient Instructions (Addendum)
Prednisone taper over next week.  Continue on Symbicort and Incruse  Albuterol Neb As needed   Continue on Oxygen Goal is to keep sats >88-90% Activity as tolerated  CT chest next month as planned  Follow up with Dr. Vassie Loll in 2  months and As needed  Please contact office for sooner follow up if symptoms do not improve or worsen or seek emergency care

## 2022-09-09 NOTE — Assessment & Plan Note (Signed)
Continue on oxygen to maintain O2 saturations greater than 88 to 90%. 

## 2022-09-11 ENCOUNTER — Other Ambulatory Visit: Payer: Self-pay | Admitting: Family Medicine

## 2022-09-11 ENCOUNTER — Other Ambulatory Visit: Payer: Self-pay | Admitting: Nurse Practitioner

## 2022-09-11 DIAGNOSIS — I10 Essential (primary) hypertension: Secondary | ICD-10-CM

## 2022-09-11 DIAGNOSIS — K219 Gastro-esophageal reflux disease without esophagitis: Secondary | ICD-10-CM

## 2022-09-13 NOTE — Telephone Encounter (Signed)
Requested Prescriptions  Pending Prescriptions Disp Refills   olmesartan (BENICAR) 40 MG tablet [Pharmacy Med Name: OLMESARTAN MEDOXOMIL 40 MG TAB] 90 tablet 0    Sig: TAKE 1 TABLET BY MOUTH EVERY DAY     Cardiovascular:  Angiotensin Receptor Blockers Failed - 09/11/2022  9:18 AM      Failed - K in normal range and within 180 days    Potassium  Date Value Ref Range Status  08/17/2022 3.1 (L) 3.5 - 5.1 mmol/L Final         Passed - Cr in normal range and within 180 days    Creatinine, Ser  Date Value Ref Range Status  08/17/2022 0.95 0.44 - 1.00 mg/dL Final         Passed - Patient is not pregnant      Passed - Last BP in normal range    BP Readings from Last 1 Encounters:  09/09/22 118/74         Passed - Valid encounter within last 6 months    Recent Outpatient Visits           2 months ago Primary hypertension   [place] [facility] [place], Shea Stakes, NP   5 months ago Prediabetes   [facility] [place], Shea Stakes, NP   7 months ago Prediabetes   [facility] [place], Marzella Schlein, [place]   1 year ago Essential hypertension   [place] [facility] [place], [place], NP   1 year ago Hypertension, essential, benign   [place] [facility] Claiborne Rigg, NP       Future Appointments             In 1 month Claiborne Rigg, NP [facility]   In 1 month Jodelle Red, MD [facility] Vascular at [facility], [place]   In 1 month Florian Buff, Rochel Brome, MD [facility] at [facility], [facility]   In 2 months Oretha Milch, MD [facility] at [facility], [facility]             omeprazole (PRILOSEC) 40 MG capsule [Pharmacy Med Name: OMEPRAZOLE DR 40 MG CAPSULE] 180 capsule 0    Sig: TAKE 1 CAPSULE (40 MG TOTAL) BY MOUTH IN THE MORNING AND AT BEDTIME.      Gastroenterology: Proton Pump Inhibitors Passed - 09/11/2022  9:18 AM      Passed - Valid encounter within last 12 months    Recent Outpatient Visits           2 months ago Primary hypertension   [facility] [place], Shea Stakes, NP   5 months ago Prediabetes   [facility] [place], Shea Stakes, NP   7 months ago Prediabetes   [facility] [place], Marzella Schlein, [place]   1 year ago Essential hypertension   [place] [facility] [place], Shea Stakes, NP   1 year ago Hypertension, essential, benign   [facility] Claiborne Rigg, NP       Future Appointments             In 1 month Claiborne Rigg, NP [facility]   In 1 month [place],  Hughie Closs, MD [place] [facility] at [facility], [place]   In 1 month Florian Buff, Rochel Brome, MD [facility]cology at [facility], [facility]   In 2 months Oretha Milch, MD [facility] Pulmonary at [facility], [place]

## 2022-09-13 NOTE — Telephone Encounter (Signed)
Requested Prescriptions  Refused Prescriptions Disp Refills   KLOR-CON M20 20 MEQ tablet [Pharmacy Med Name: KLOR-CON M20 TABLET] 90 tablet 1    Sig: TAKE 1 TABLET BY MOUTH EVERY DAY FOR 5 DAYS     Endocrinology:  Minerals - Potassium Supplementation Failed - 09/11/2022  9:17 AM      Failed - K in normal range and within 360 days    Potassium  Date Value Ref Range Status  08/17/2022 3.1 (L) 3.5 - 5.1 mmol/L Final         Passed - Cr in normal range and within 360 days    Creatinine, Ser  Date Value Ref Range Status  08/17/2022 0.95 0.44 - 1.00 mg/dL Final         Passed - Valid encounter within last 12 months    Recent Outpatient Visits           2 months ago Primary hypertension   [place] [facility] [place], Shea Stakes, NP   5 months ago Prediabetes   [facility] [place], Shea Stakes, NP   7 months ago Prediabetes   [facility] [place], Marzella Schlein, [place]   1 year ago Essential hypertension    [facility] [place], [place], NP   1 year ago Hypertension, essential, benign   [facility] Claiborne Rigg, NP       Future Appointments             In 1 month Claiborne Rigg, NP [facility]   In 1 month Jodelle Red, MD [facility] Heart & Vascular at [facility] [place], [place]   In 1 month Florian Buff, Rochel Brome, MD [facility] Urogynecology at [facility], [facility]   In 2 months Oretha Milch, MD [facility] Pulmonary at [facility], [place]

## 2022-09-15 ENCOUNTER — Telehealth: Payer: Self-pay | Admitting: Adult Health

## 2022-09-15 NOTE — Telephone Encounter (Signed)
Pt. Calling to ask NP if she can call her in some NEB. Meds to her pharmacy

## 2022-09-16 ENCOUNTER — Telehealth: Payer: Self-pay | Admitting: Adult Health

## 2022-09-16 NOTE — Telephone Encounter (Signed)
Patient would like order for portable oxygen concentrator. Patient uses [facility] for oxygen. Fax number is [phone]. Patient phone number is [phone].Marland Kitchen

## 2022-09-17 ENCOUNTER — Other Ambulatory Visit: Payer: Self-pay

## 2022-09-17 MED ORDER — IPRATROPIUM-ALBUTEROL 0.5-2.5 (3) MG/3ML IN SOLN: RESPIRATORY_TRACT | 12 refills | Status: DC

## 2022-09-17 NOTE — Telephone Encounter (Signed)
Spoke with the pt  I scheduled her ov with TP for POC eval  Nothing further needed

## 2022-09-17 NOTE — Telephone Encounter (Signed)
Monica Scott since Dr. Vassie Loll and Babette Relic are off is it okay for me to send in Duo-neb prescription.

## 2022-09-17 NOTE — Telephone Encounter (Signed)
Duo neb has been sent to patients pharmacy. Patient is aware. NFN

## 2022-09-17 NOTE — Telephone Encounter (Signed)
Duo-Neb has been sent to patients pharmacy. She is aware. nfn

## 2022-09-18 ENCOUNTER — Other Ambulatory Visit: Payer: Self-pay | Admitting: Obstetrics and Gynecology

## 2022-09-18 DIAGNOSIS — N952 Postmenopausal atrophic vaginitis: Secondary | ICD-10-CM

## 2022-09-21 ENCOUNTER — Emergency Department (HOSPITAL_BASED_OUTPATIENT_CLINIC_OR_DEPARTMENT_OTHER)
Admission: EM | Admit: 2022-09-21 | Discharge: 2022-09-21 | Disposition: A | Discharge status: Home / Self Care | Payer: Medicare (Managed Care) | Attending: Emergency Medicine | Admitting: Emergency Medicine

## 2022-09-21 ENCOUNTER — Other Ambulatory Visit: Payer: Self-pay

## 2022-09-21 ENCOUNTER — Encounter (HOSPITAL_BASED_OUTPATIENT_CLINIC_OR_DEPARTMENT_OTHER): Payer: Self-pay | Admitting: Emergency Medicine

## 2022-09-21 ENCOUNTER — Ambulatory Visit: Payer: Self-pay

## 2022-09-21 ENCOUNTER — Emergency Department (HOSPITAL_BASED_OUTPATIENT_CLINIC_OR_DEPARTMENT_OTHER): Payer: Medicare (Managed Care)

## 2022-09-21 DIAGNOSIS — Z7982 Long term (current) use of aspirin: Secondary | ICD-10-CM | POA: Diagnosis not present

## 2022-09-21 DIAGNOSIS — Z79899 Other long term (current) drug therapy: Secondary | ICD-10-CM | POA: Diagnosis not present

## 2022-09-21 DIAGNOSIS — I10 Essential (primary) hypertension: Secondary | ICD-10-CM | POA: Insufficient documentation

## 2022-09-21 DIAGNOSIS — I6782 Cerebral ischemia: Secondary | ICD-10-CM | POA: Insufficient documentation

## 2022-09-21 NOTE — ED Notes (Addendum)
Discharge paperwork given and verbally understood. 

## 2022-09-21 NOTE — Telephone Encounter (Addendum)
     Chief Complaint: BP "creeping up for about 1 month, not every day." Today BP 171/100, dizzy, chills. Asking for medication adjustment, has appointment next month. Symptoms: Above Frequency: 1 month  Pertinent Negatives: Patient denies chest pain Disposition: [] ED /[] Urgent Care (no appt availability in office) / [] Appointment(In office/virtual)/ []  [place] [facility]/ [] Home Care/ [x] Refused Recommended Disposition /[] [place] [facility]/ []  Follow-up with PCP Additional Notes: Instructed to go to ED for worsening of symptoms. Please advise pt.  Reason for Disposition  Systolic BP  >= 160 OR Diastolic >= 100  Answer Assessment - Initial Assessment Questions 1. BLOOD PRESSURE: "What is the blood pressure?" "Did you take at least two measurements 5 minutes apart?"     171/100 2. ONSET: "When did you take your blood pressure?"     Today 3. HOW: "How did you take your blood pressure?" (e.g., automatic home BP monitor, visiting nurse)     Home cuff 4. HISTORY: "Do you have a history of high blood pressure?"     Yes 5. MEDICINES: "Are you taking any medicines for blood pressure?" "Have you missed any doses recently?"     Yes 6. OTHER SYMPTOMS: "Do you have any symptoms?" (e.g., blurred vision, chest pain, difficulty breathing, headache, weakness)     Dizzy, chills 7. PREGNANCY: "Is there any chance you are pregnant?" "When was your last menstrual period?"     No  Protocols used: Blood Pressure - High-A-AH

## 2022-09-21 NOTE — ED Provider Notes (Signed)
[place] EMERGENCY DEPARTMENT AT [facility] Provider Note   CSN: [id number] Arrival date & time: 09/21/22  1806     History  Chief Complaint  Patient presents with   Hypertension    Monica Scott is a 67 y.o. female.  67 year old female with past medical history significant for hypertension presents today for concern of elevated blood pressure.  She states about 1 month ago her antihypertensives was switched from amlodipine to olmesartan.  She states over the past week she has been checking her blood pressure and has been running high.  She states since then she has also noticed that she is dizzy.  No vision change, no balance issues, no chest pain, no dyspnea.  She states in the morning her blood pressure is typically normal however throughout the day it does increase.  The history is provided by the patient. No language interpreter was used.       Home Medications Prior to Admission medications   Medication Sig Start Date End Date Taking? Authorizing Provider  acetaminophen (TYLENOL) 325 MG tablet Take 650 mg by mouth every 6 (six) hours as needed for mild pain.    [provider]  albuterol (VENTOLIN HFA) 108 (90 Base) MCG/ACT inhaler INHALE 1-2 PUFFS EVERY 6 HOURS AS NEEDED FOR WHEEZE OR SHORTNESS OF BREATH 05/21/22   Oretha Milch, MD  amLODipine (NORVASC) 5 MG tablet Take 1 tablet (5 mg total) by mouth daily. 04/13/22   Claiborne Rigg, NP  aspirin EC 81 MG tablet Take 1 tablet (81 mg total) by mouth daily. Swallow whole. 02/22/22   Jodelle Red, MD  budesonide-formoterol [facility]) 80-4.5 MCG/ACT inhaler Inhale 2 puffs into the lungs 2 (two) times daily. 08/03/22   Oretha Milch, MD  buPROPion (WELLBUTRIN XL) 150 MG 24 hr tablet Take 1 tablet (150 mg total) by mouth at bedtime. 04/13/22   Claiborne Rigg, NP  estradiol (ESTRING) 7.5 MCG/24HR vaginal ring Place 1 each vaginally every 3 (three) months. 04/22/22   Selmer Dominion, NP  FLUoxetine  (PROZAC) 20 MG capsule Take 60 mg by mouth every morning. 01/20/22   [provider]  gabapentin (NEURONTIN) 400 MG capsule Take 1 capsule (400 mg total) by mouth 3 (three) times daily. Patient taking differently: Take 800 mg by mouth at bedtime. 09/07/21   Claiborne Rigg, NP  guaiFENesin (MUCINEX) 600 MG 12 hr tablet Take 1 tablet (600 mg total) by mouth 2 (two) times daily. 07/13/22   Claiborne Rigg, NP  ibuprofen (ADVIL,MOTRIN) 200 MG tablet Take 400 mg by mouth daily as needed for moderate pain.    [provider]  ipratropium-albuterol (DUONEB) 0.5-2.5 (3) MG/3ML SOLN SMARTSIG:1 Ampule(s) Via Nebulizer Every 6 Hours PRN 09/17/22   Bevelyn Ngo, NP  methocarbamol (ROBAXIN) 500 MG tablet TAKE 1 TABLET (500 MG TOTAL) BY MOUTH 2 (TWO) TIMES DAILY AS NEEDED FOR MUSCLE SPASMS. 09/06/22   Claiborne Rigg, NP  OLANZapine (ZYPREXA) 10 MG tablet Take 10 mg by mouth at bedtime.     [provider]  olmesartan (BENICAR) 40 MG tablet TAKE 1 TABLET BY MOUTH EVERY DAY 09/13/22   Claiborne Rigg, NP  omeprazole (PRILOSEC) 40 MG capsule TAKE 1 CAPSULE (40 MG TOTAL) BY MOUTH IN THE MORNING AND AT BEDTIME. 09/13/22   Claiborne Rigg, NP  predniSONE (DELTASONE) 10 MG tablet 4 tabs for 2 days, then 3 tabs for 2 days, 2 tabs for 2 days, then 1 tab for  2 days, then stop 09/09/22   Parrett, [name] S, NP  rosuvastatin (CRESTOR) 40 MG tablet Take 1 tablet (40 mg total) by mouth daily. 03/16/22 03/11/23  Alver Sorrow, NP  Semaglutide-Weight Management (WEGOVY) 0.25 MG/0.5ML SOAJ Inject 0.25 mg into the skin once a week. Patient not taking: Reported on 09/09/2022 07/13/22   Claiborne Rigg, NP  tirzepatide [facility]) 2.5 MG/0.5ML Pen Inject 2.5 mg into the skin once a week. Patient not taking: Reported on 09/09/2022 07/13/22   Claiborne Rigg, NP  traZODone (DESYREL) 100 MG tablet Take 100 mg by mouth at bedtime. 01/20/22   [provider]  Dwyane Luo 100-62.5-25 MCG/ACT AEPB  TAKE 1 PUFF BY MOUTH EVERY DAY Patient not taking: Reported on 09/09/2022 03/24/22   Oretha Milch, MD  umeclidinium bromide (INCRUSE ELLIPTA) 62.5 MCG/ACT AEPB Inhale 1 puff into the lungs daily. 07/13/22   Oretha Milch, MD      Allergies    Tizanidine    Review of Systems   Review of Systems  Physical Exam Updated Vital Signs BP (!) 167/99 (BP Location: Right Arm)   Pulse (!) 102   Temp 99.1 F (37.3 C) (Oral)   Resp 18   Ht 4\' 9"  (1.448 m)   Wt 68 kg   SpO2 98%   BMI 32.46 kg/m  Physical Exam Vitals and nursing note reviewed.  Constitutional:      General: She is not in acute distress.    Appearance: Normal appearance. She is not ill-appearing.  HENT:     Head: Normocephalic and atraumatic.     Nose: Nose normal.  Eyes:     General: No scleral icterus.    Extraocular Movements: Extraocular movements intact.     Conjunctiva/sclera: Conjunctivae normal.     Pupils: Pupils are equal, round, and reactive to light.  Cardiovascular:     Rate and Rhythm: Normal rate and regular rhythm.     Pulses: Normal pulses.     Heart sounds: Normal heart sounds.  Pulmonary:     Effort: Pulmonary effort is normal. No respiratory distress.     Breath sounds: Normal breath sounds. No wheezing or rales.  Abdominal:     General: There is no distension.     Tenderness: There is no abdominal tenderness.  Musculoskeletal:        General: Normal range of motion.     Cervical back: Normal range of motion.  Skin:    General: Skin is warm and dry.  Neurological:     General: No focal deficit present.     Mental Status: She is alert and oriented to person, place, and time. Mental status is at baseline.     Comments: Full range of motion in bilateral upper and lower extremities.  Cranial nerves III through XII intact.  5/5 strength in extensor and flexor muscle group in both upper and lower extremities.  No facial droop.  No pronator drift.  Symmetrical smile.  Tongue midline.     ED  Results / Procedures / Treatments   Labs (all labs ordered are listed, but only abnormal results are displayed) Labs Reviewed - No data to display  EKG None  Radiology No results found.  Procedures Procedures    Medications Ordered in ED Medications - No data to display  ED Course/ Medical Decision Making/ A&P  Medical Decision Making Amount and/or Complexity of Data Reviewed Radiology: ordered.   67 year old female presents today for elevated blood pressure.  Complains of feeling lightheaded/dizzy.  Neurological exam without any focal deficits.  Denies chest pain, or shortness of breath.  No balance issues or vision change.  Will obtain CT head.  Do not feel we need blood work at this time.  Blood pressure during my interview has improved to 157/87.  Discussed close follow-up with PCP.  Discussed blood pressure diary.  Discussed having a routine and checking blood pressure no more than twice a day at most.  Strict signs and symptoms discussed that would warrant immediate return to the emergency department.  CT without acute intracranial finding.  Patient will follow-up with PCP.  She is appropriate for discharge.  Discharged in stable condition.   Final Clinical Impression(s) / ED Diagnoses Final diagnoses:  Uncontrolled hypertension    Rx / DC Orders ED Discharge Orders     None         Marita Kansas, PA-C 09/21/22 Serena Croissant    Ernie Avena, MD 09/21/22 2000

## 2022-09-21 NOTE — ED Triage Notes (Signed)
Pt arrives to ED with c/o HTN. Pt notes she has had elevated blood pressures over the past week. When her blood pressure are in the 170s she gets dizzy.

## 2022-09-21 NOTE — Discharge Instructions (Addendum)
No concerning findings on your workup.  CT scan does not show any concerning findings.  Follow-up with your primary care provider.  Continue to keep a blood pressure diary.  Continue taking your home blood pressure medication.  If you develop chest pain, shortness of breath, balance issues, vision change please return to the emergency department.

## 2022-09-22 NOTE — Telephone Encounter (Signed)
Patient assessed in ED on 09/22/22.

## 2022-09-27 ENCOUNTER — Encounter: Payer: Self-pay | Admitting: Nurse Practitioner

## 2022-09-29 ENCOUNTER — Other Ambulatory Visit: Payer: Self-pay | Admitting: Pulmonary Disease

## 2022-09-29 ENCOUNTER — Ambulatory Visit: Payer: Self-pay

## 2022-09-29 DIAGNOSIS — J449 Chronic obstructive pulmonary disease, unspecified: Secondary | ICD-10-CM

## 2022-09-29 NOTE — Telephone Encounter (Signed)
Patient identified by name and date of birth.  Patient aware of response and voiced understanding. Patient is aware that the amlodipine does have a refill left.

## 2022-09-29 NOTE — Telephone Encounter (Signed)
Amlodipine was never stopped by me. It was decreased to 5 mg only and she was instructed to continue the olmesartan 40mg  daily. She should already have refills of this since it was never discontinued

## 2022-09-29 NOTE — Telephone Encounter (Signed)
  Chief Complaint: HTN Symptoms: BP 138/86 today, yesterday 168/95, dizziness, shakiness and  HA in afternoons  Frequency: 2 weeks  Pertinent Negatives: NA Disposition: [] ED /[] Urgent Care (no appt availability in office) / [x] Appointment(In office/virtual)/ []  [facility]/ [] Home Care/ [] Refused Recommended Disposition /[] [facility]/ []  Follow-up with PCP Additional Notes: pt states her BP meds were changed from amlodipine to olmesartan d/t BP was too low. Pt states she has been checking BP 1 hr after taking olmesartan in the mornings and BP increases throughout the day where she gets dizzy and slight HA and shakiness and ends up taking a 1/2 tab of olmesartan to bring BO down. I tried to schedule pt for sooner appt since she has OV scheduled for 10/13/22 with Zelda, NP but none available. Advised pt to monitor BP and keep log as well. Pt verbalized understanding. I called pt back while reviewing med list and asked pt if she still taking amlodipine since it was still on med list. Pt states it was stopped about 3 months ago d/t she was at UroGyn appt and BP was too low so Zelda, NP called her and advised her to stop amlodipine and decrease Olmesartan to 1/2 tab. Advised pt I would send Zelda, NP a message to see if she ok with pt restarting amlodipine since pt would like to restart since she had better control with it for BP control.   Summary: flucuating BP   BP has been going up and down for about two weeks... right now it is 138/86  yesterday it was 168/95. Patient is currently taking Losartan     Reason for Disposition  [1] Systolic BP  >= 130 OR Diastolic >= 80 AND [2] taking BP medications  Answer Assessment - Initial Assessment Questions 1. BLOOD PRESSURE: "What is the blood pressure?" "Did you take at least two measurements 5 minutes apart?"     Today 138/86, yesterday 168/95 2. ONSET: "When did you take your blood pressure?"     2 weeks  3. HOW: "How did you take  your blood pressure?" (e.g., automatic home BP monitor, visiting nurse)     Home BP 4. HISTORY: "Do you have a history of high blood pressure?"     yes 5. MEDICINES: "Are you taking any medicines for blood pressure?" "Have you missed any doses recently?"     Yes and no 6. OTHER SYMPTOMS: "Do you have any symptoms?" (e.g., blurred vision, chest pain, difficulty breathing, headache, weakness)     Dizziness and shaking, HA  Protocols used: Blood Pressure - High-A-AH

## 2022-10-06 ENCOUNTER — Other Ambulatory Visit: Payer: Self-pay | Admitting: Obstetrics and Gynecology

## 2022-10-06 DIAGNOSIS — N952 Postmenopausal atrophic vaginitis: Secondary | ICD-10-CM

## 2022-10-11 ENCOUNTER — Ambulatory Visit
Admission: RE | Admit: 2022-10-11 | Discharge: 2022-10-11 | Disposition: A | Discharge status: Home / Self Care | Payer: Medicaid Other | Source: Ambulatory Visit | Attending: Nurse Practitioner | Admitting: Nurse Practitioner

## 2022-10-11 DIAGNOSIS — Z87891 Personal history of nicotine dependence: Secondary | ICD-10-CM

## 2022-10-11 DIAGNOSIS — D72829 Elevated white blood cell count, unspecified: Secondary | ICD-10-CM

## 2022-10-13 ENCOUNTER — Encounter: Payer: Self-pay | Admitting: Nurse Practitioner

## 2022-10-13 ENCOUNTER — Other Ambulatory Visit: Payer: Self-pay | Admitting: Nurse Practitioner

## 2022-10-13 ENCOUNTER — Ambulatory Visit: Payer: Medicare (Managed Care) | Attending: Nurse Practitioner | Admitting: Nurse Practitioner

## 2022-10-13 VITALS — BP 133/81 | HR 106 | Ht <= 58 in | Wt 148.2 lb

## 2022-10-13 DIAGNOSIS — I700 Atherosclerosis of aorta: Secondary | ICD-10-CM

## 2022-10-13 DIAGNOSIS — E7800 Pure hypercholesterolemia, unspecified: Secondary | ICD-10-CM | POA: Diagnosis not present

## 2022-10-13 DIAGNOSIS — I10 Essential (primary) hypertension: Secondary | ICD-10-CM | POA: Diagnosis not present

## 2022-10-13 DIAGNOSIS — R7303 Prediabetes: Secondary | ICD-10-CM | POA: Diagnosis not present

## 2022-10-13 MED ORDER — AMLODIPINE BESYLATE 10 MG PO TABS: 10.0000 mg | ORAL_TABLET | Freq: Every day | ORAL | 1 refills | Status: DC

## 2022-10-13 MED ORDER — ROSUVASTATIN CALCIUM 40 MG PO TABS
40.0000 mg | ORAL_TABLET | Freq: Every day | ORAL | 3 refills | Status: DC
Start: 2022-10-13 — End: 2023-02-21

## 2022-10-13 NOTE — Progress Notes (Signed)
Assessment & Plan:  Monica "[name]" was seen today for hypertension.  Diagnoses and all orders for this visit:  Essential hypertension Continue benicar Increase amlodipine to 10 mg daily Bring in your BP log and device in 2 weeks  -     amLODipine (NORVASC) 10 MG tablet; Take 1 tablet (10 mg total) by mouth daily.  Hypercholesterolemia -     Hemoglobin A1c  Aortic atherosclerosis (HCC) She has not been taking crestor. Was not aware this had been prescribed for her cholesterol level lowering.  She began taking.  She was also made aware today of her recent lung CT results with aortic atherosclerosis -     rosuvastatin (CRESTOR) 40 MG tablet; Take 1 tablet (40 mg total) by mouth daily. For cholesterol  Prediabetes -     Basic metabolic panel -     Hemoglobin A1c    Patient has been counseled on age-appropriate routine health concerns for screening and prevention. These are reviewed and up-to-date. Referrals have been placed accordingly. Immunizations are up-to-date or declined.    Subjective:   Chief Complaint  Patient presents with   Hypertension    Monica Scott 67 y.o. female presents to office today for follow up to HTN   HTN Although blood pressure is normal here in office, she reports home readings 170/90-100s. She is taking benicar 40 mg daily and amlodipine 5 mg daily as prescribed. Associated symptoms of HTN at home: dizziness.  BP Readings from Last 3 Encounters:  10/13/22 133/81  09/21/22 139/83  09/09/22 118/74     Review of Systems  Constitutional:  Negative for fever, malaise/fatigue and weight loss.  HENT: Negative.  Negative for nosebleeds.   Eyes: Negative.  Negative for blurred vision, double vision and photophobia.  Respiratory: Negative.  Negative for cough and shortness of breath.   Cardiovascular: Negative.  Negative for chest pain, palpitations and leg swelling.  Gastrointestinal: Negative.  Negative for heartburn, nausea and vomiting.   Musculoskeletal: Negative.  Negative for myalgias.  Neurological: Negative.  Negative for dizziness, focal weakness, seizures and headaches.  Psychiatric/Behavioral: Negative.  Negative for suicidal ideas.     Past Medical History:  Diagnosis Date   Arthritis    Asthma    Bladder disease    COPD (chronic obstructive pulmonary disease) (HCC)    Depression    GERD (gastroesophageal reflux disease)    Hyperlipidemia    Hypertension    Peptic ulcer    Urinary retention     Past Surgical History:  Procedure Laterality Date   BIOPSY  04/24/2018   Procedure: BIOPSY;  Surgeon: Corbin Ade, MD;  [facility];  Service: Endoscopy;;  esophagus   COLONOSCOPY WITH PROPOFOL N/A 04/24/2018   Procedure: COLONOSCOPY WITH PROPOFOL;  Surgeon: Corbin Ade, MD;  [facility]: [facility];  Service: Endoscopy;  Laterality: N/A;  9:00am   COLONOSCOPY WITH PROPOFOL N/A 09/21/2021   Procedure: COLONOSCOPY WITH PROPOFOL;  Surgeon: Corbin Ade, MD;  [facility]: [facility];  Service: Endoscopy;  Laterality: N/A;  2:15 / ASA 2   ESOPHAGOGASTRODUODENOSCOPY (EGD) WITH PROPOFOL N/A 04/24/2018   Procedure: ESOPHAGOGASTRODUODENOSCOPY (EGD) WITH PROPOFOL;  Surgeon: Corbin Ade, MD;  [facility]: [facility];  Service: Endoscopy;  Laterality: N/A;   MALONEY DILATION N/A 04/24/2018   Procedure: Elease Hashimoto DILATION;  Surgeon: Corbin Ade, MD;  [facility];  Service: Endoscopy;  Laterality: N/A;   PILONIDAL CYST EXCISION     age 73  POLYPECTOMY  04/24/2018   Procedure: POLYPECTOMY;  Surgeon: Corbin Ade, MD;  [facility];  Service: Endoscopy;;  colon    POLYPECTOMY  09/21/2021   Procedure: POLYPECTOMY;  Surgeon: Corbin Ade, MD;  [facility];  Service: Endoscopy;;   VAGINAL HYSTERECTOMY      Family History  Problem Relation Age of Onset   Alcohol abuse Mother    Arthritis Mother    Cancer Mother    Depression Mother    Early death Mother     Alcohol abuse Father    Depression Father    Alcohol abuse Sister    Depression Sister    Alcohol abuse Daughter    Depression Daughter    COPD Maternal Grandfather    Heart disease Maternal Grandfather    Hypertension Maternal Grandfather    Alcohol abuse Brother    Colon cancer Brother        deceased at age 67   Alcohol abuse Son    Breast cancer Neg Hx     Social History Reviewed with no changes to be made today.   Outpatient Medications Prior to Visit  Medication Sig Dispense Refill   acetaminophen (TYLENOL) 325 MG tablet Take 650 mg by mouth every 6 (six) hours as needed for mild pain.     albuterol (VENTOLIN HFA) 108 (90 Base) MCG/ACT inhaler INHALE 1-2 PUFFS EVERY 6 HOURS AS NEEDED FOR WHEEZE OR SHORTNESS OF BREATH **NEED APPOINTMENT 8.5 each 0   aspirin EC 81 MG tablet Take 1 tablet (81 mg total) by mouth daily. Swallow whole. 90 tablet 3   budesonide-formoterol (BREYNA) 80-4.5 MCG/ACT inhaler Inhale 2 puffs into the lungs 2 (two) times daily. 1 each 5   buPROPion (WELLBUTRIN XL) 150 MG 24 hr tablet Take 1 tablet (150 mg total) by mouth at bedtime. 90 tablet 1   estradiol (ESTRACE) 0.1 MG/GM vaginal cream PLACE 0.5 G VAGINALLY 2 (TWO) TIMES A WEEK. PLACE 0.5G NIGHTLY FOR TWO WEEKS THEN TWICE A WEEK AFTER 42.5 g 11   estradiol (ESTRING) 7.5 MCG/24HR vaginal ring Place 1 each vaginally every 3 (three) months. 1 each 3   FLUoxetine (PROZAC) 20 MG capsule Take 60 mg by mouth every morning.     gabapentin (NEURONTIN) 400 MG capsule Take 1 capsule (400 mg total) by mouth 3 (three) times daily. (Patient taking differently: Take 800 mg by mouth at bedtime.) 90 capsule 6   guaiFENesin (MUCINEX) 600 MG 12 hr tablet Take 1 tablet (600 mg total) by mouth 2 (two) times daily. 30 tablet 1   ibuprofen (ADVIL,MOTRIN) 200 MG tablet Take 400 mg by mouth daily as needed for moderate pain.     ipratropium-albuterol (DUONEB) 0.5-2.5 (3) MG/3ML SOLN SMARTSIG:1 Ampule(s) Via Nebulizer Every 6  Hours PRN 360 mL 12   methocarbamol (ROBAXIN) 500 MG tablet TAKE 1 TABLET (500 MG TOTAL) BY MOUTH 2 (TWO) TIMES DAILY AS NEEDED FOR MUSCLE SPASMS. 40 tablet 0   OLANZapine (ZYPREXA) 10 MG tablet Take 10 mg by mouth at bedtime.      olmesartan (BENICAR) 40 MG tablet TAKE 1 TABLET BY MOUTH EVERY DAY 90 tablet 0   omeprazole (PRILOSEC) 40 MG capsule TAKE 1 CAPSULE (40 MG TOTAL) BY MOUTH IN THE MORNING AND AT BEDTIME. 180 capsule 0   traZODone (DESYREL) 100 MG tablet Take 100 mg by mouth at bedtime.     TRELEGY ELLIPTA 100-62.5-25 MCG/ACT AEPB TAKE 1 PUFF BY MOUTH EVERY DAY 60 each 5  umeclidinium bromide (INCRUSE ELLIPTA) 62.5 MCG/ACT AEPB Inhale 1 puff into the lungs daily. 30 each 3   amLODipine (NORVASC) 5 MG tablet Take 1 tablet (5 mg total) by mouth daily. 90 tablet 1   rosuvastatin (CRESTOR) 40 MG tablet Take 1 tablet (40 mg total) by mouth daily. 90 tablet 3   Semaglutide-Weight Management (WEGOVY) 0.25 MG/0.5ML SOAJ Inject 0.25 mg into the skin once a week. (Patient not taking: Reported on 10/13/2022) 2 mL 1   tirzepatide (MOUNJARO) 2.5 MG/0.5ML Pen Inject 2.5 mg into the skin once a week. (Patient not taking: Reported on 10/13/2022) 2 mL 1   predniSONE (DELTASONE) 10 MG tablet 4 tabs for 2 days, then 3 tabs for 2 days, 2 tabs for 2 days, then 1 tab for 2 days, then stop (Patient not taking: Reported on 10/13/2022) 20 tablet 0   No facility-administered medications prior to visit.    Allergies  Allergen Reactions   Tizanidine Other (See Comments)    Caused dizziness and dry mouth. Patient prefers not to take this again.       Objective:    BP 133/81 (BP Location: Right Arm, Patient Position: Sitting, Cuff Size: Normal)   Pulse (!) 106   Ht 4\' 9"  (1.448 m)   Wt 148 lb 3.2 oz (67.2 kg)   SpO2 95%   BMI 32.07 kg/m  Wt Readings from Last 3 Encounters:  10/13/22 148 lb 3.2 oz (67.2 kg)  09/21/22 150 lb (68 kg)  09/09/22 153 lb (69.4 kg)    Physical Exam Vitals and nursing note  reviewed.  Constitutional:      Appearance: She is well-developed.  HENT:     Head: Normocephalic and atraumatic.  Cardiovascular:     Rate and Rhythm: Regular rhythm. Tachycardia present.     Heart sounds: Normal heart sounds. No murmur heard.    No friction rub. No gallop.  Pulmonary:     Effort: Pulmonary effort is normal. No tachypnea or respiratory distress.     Breath sounds: Normal breath sounds. No decreased breath sounds, wheezing, rhonchi or rales.  Chest:     Chest wall: No tenderness.  Abdominal:     General: Bowel sounds are normal.     Palpations: Abdomen is soft.  Musculoskeletal:        General: Normal range of motion.     Cervical back: Normal range of motion.  Skin:    General: Skin is warm and dry.  Neurological:     Mental Status: She is alert and oriented to person, place, and time.     Coordination: Coordination normal.  Psychiatric:        Behavior: Behavior normal. Behavior is cooperative.        Thought Content: Thought content normal.        Judgment: Judgment normal.          Patient has been counseled extensively about nutrition and exercise as well as the importance of adherence with medications and regular follow-up. The patient was given clear instructions to go to ER or return to medical center if symptoms don't improve, worsen or new problems develop. The patient verbalized understanding.   Follow-up: Return for see me in 3 months.   Claiborne Rigg, FNP-BC [facility] [place], [place] [phone]   10/13/2022, 9:25 AM

## 2022-10-14 LAB — BASIC METABOLIC PANEL
BUN/Creatinine Ratio: 4 — ABNORMAL LOW (ref 12–28)
BUN: 4 mg/dL — ABNORMAL LOW (ref 8–27)
CO2: 22 mmol/L (ref 20–29)
Calcium: 9.8 mg/dL (ref 8.7–10.3)
Chloride: 94 mmol/L — ABNORMAL LOW (ref 96–106)
Creatinine, Ser: 0.94 mg/dL (ref 0.57–1.00)
Glucose: 114 mg/dL — ABNORMAL HIGH (ref 70–99)
Potassium: 3.4 mmol/L — ABNORMAL LOW (ref 3.5–5.2)
Sodium: 136 mmol/L (ref 134–144)
eGFR: 67 mL/min/{1.73_m2} (ref >59)

## 2022-10-14 LAB — HEMOGLOBIN A1C
Est. average glucose Bld gHb Est-mCnc: 126 mg/dL
Hgb A1c MFr Bld: 6 % — ABNORMAL HIGH (ref 4.8–5.6)

## 2022-10-19 ENCOUNTER — Ambulatory Visit: Payer: Medicare (Managed Care) | Admitting: Adult Health

## 2022-10-20 ENCOUNTER — Ambulatory Visit (HOSPITAL_BASED_OUTPATIENT_CLINIC_OR_DEPARTMENT_OTHER): Payer: Medicare (Managed Care) | Admitting: Cardiology

## 2022-10-21 ENCOUNTER — Telehealth: Payer: Self-pay | Admitting: Pulmonary Disease

## 2022-10-21 NOTE — Telephone Encounter (Signed)
Patient called to request an antibiotic for her chest cold.  Please advise and call patient to confirm.  CB# [phone]

## 2022-10-21 NOTE — Telephone Encounter (Signed)
PT would like Dr. To call in some antibx for her.   Pharm is [facility] on [address].   Her # is [phone]

## 2022-10-22 ENCOUNTER — Other Ambulatory Visit: Payer: Self-pay

## 2022-10-22 MED ORDER — AZITHROMYCIN 250 MG PO TABS: 250.0000 mg | ORAL_TABLET | Freq: Every day | ORAL | 0 refills | Status: DC

## 2022-10-22 NOTE — Telephone Encounter (Signed)
Spoke with patient regarding prior message. Let patient know per Dr.Alva ok to sent in script for Zithromax. Patient's voice was understanding. Nothing else further needed.

## 2022-10-22 NOTE — Telephone Encounter (Signed)
Patient states having symptom of cough. Patient phone number is [phone].

## 2022-10-25 NOTE — Telephone Encounter (Signed)
Called and spoke w/ pt she stated that she is feeling better she has started the z-pac she said that she will call us back if she gets worse.

## 2022-10-29 ENCOUNTER — Other Ambulatory Visit: Payer: Self-pay | Admitting: *Deleted

## 2022-10-29 ENCOUNTER — Ambulatory Visit: Payer: Self-pay | Admitting: Pharmacist

## 2022-10-29 DIAGNOSIS — Z122 Encounter for screening for malignant neoplasm of respiratory organs: Secondary | ICD-10-CM

## 2022-10-29 DIAGNOSIS — Z87891 Personal history of nicotine dependence: Secondary | ICD-10-CM

## 2022-11-02 ENCOUNTER — Ambulatory Visit (INDEPENDENT_AMBULATORY_CARE_PROVIDER_SITE_OTHER): Payer: Medicare (Managed Care) | Admitting: Obstetrics and Gynecology

## 2022-11-02 ENCOUNTER — Encounter: Payer: Self-pay | Admitting: Obstetrics and Gynecology

## 2022-11-02 VITALS — BP 106/69 | HR 99

## 2022-11-02 DIAGNOSIS — N993 Prolapse of vaginal vault after hysterectomy: Secondary | ICD-10-CM

## 2022-11-02 DIAGNOSIS — Z4689 Encounter for fitting and adjustment of other specified devices: Secondary | ICD-10-CM

## 2022-11-02 NOTE — Progress Notes (Signed)
[facility]   Subjective:     Chief Complaint:  Chief Complaint  Patient presents with   Follow-up    Monica Scott is a 67 y.o. female here for a pessary check and surgery discussion.   History of Present Illness: Monica Scott is a 67 y.o. female with stage III pelvic organ prolapse who presents for a pessary check. She is using a size #3 ring with support pessary. The pessary has been working well. She is using vaginal estrogen a few times a week. She has some vaginal bleeding with wiping but no clots present. Hard for her to get the cream up high enough.  She reports that her lungs have been worse recently due to a cold but oxygen requirements are the same.   Past Medical History: Patient  has a past medical history of Arthritis, Asthma, Bladder disease, COPD (chronic obstructive pulmonary disease) (HCC), Depression, GERD (gastroesophageal reflux disease), Hyperlipidemia, Hypertension, Peptic ulcer, and Urinary retention.   Past Surgical History: She  has a past surgical history that includes Vaginal hysterectomy; Pilonidal cyst excision; Colonoscopy with propofol (N/A, 04/24/2018); Esophagogastroduodenoscopy (egd) with propofol (N/A, 04/24/2018); maloney dilation (N/A, 04/24/2018); biopsy (04/24/2018); polypectomy (04/24/2018); Colonoscopy with propofol (N/A, 09/21/2021); and polypectomy (09/21/2021).   Medications: She has a current medication list which includes the following prescription(s): acetaminophen, albuterol, amlodipine, aspirin ec, azithromycin, budesonide-formoterol, bupropion, estradiol, estradiol, fluoxetine, gabapentin, guaifenesin, ibuprofen, ipratropium-albuterol, methocarbamol, olanzapine, olmesartan, omeprazole, rosuvastatin, wegovy, tirzepatide, trazodone, trelegy ellipta, and incruse ellipta.   Allergies: Patient is allergic to tizanidine.   Social History: Patient  reports that she quit smoking about 12 years ago. Her smoking use included  cigarettes. She started smoking about 52 years ago. She has a 60 pack-year smoking history. She has never been exposed to tobacco smoke. She has never used smokeless tobacco. She reports that she does not drink alcohol and does not use drugs.      Objective:    Physical Exam: BP 106/69   Pulse 99  Gen: No apparent distress, A&O x 3. Detailed Urogynecologic Evaluation:  Pelvic Exam: Normal external female genitalia; Bartholin's and Skene's glands normal in appearance; urethral meatus normal in appearance, no urethral masses or discharge.   The pessary was noted to be in place. It was removed and cleaned. Speculum exam revealed small excoriations at the apex. Silver nitrate was place and estrogen cream was placed at the apex. The pessary was replaced.   POP-Q (01/27/21):    POP-Q   2                                            Aa   2                                           Ba   -4                                              C    5  Gh   2                                            Pb   8                                            tvl    -2.5                                            Ap   -2.5                                            Bp                                                  D     Assessment/Plan:    Assessment: [name] is a 67 y.o. with stage III pelvic organ prolapse here for a pessary check. She is doing well.  Plan: She will keep the pessary in place until next visit. She will continue to use estrogen 2-3 times per week.    We again discussed that the pulmonologist believes she is high risk for complications with surgical intervention. We reviewed potential complications from surgery and if one were to occur that could always potentially exacerbate her pulmonary issues as well. Therefore, surgery should be a last resort and would likely need regional anesthesia (would need anesthesia consult). Patient is in  agreement.   She will follow-up in 3 months for a pessary check or sooner as needed.   All questions were answered.  Marguerita Beards, MD  Time spent: I spent 20 minutes dedicated to the care of this patient on the date of this encounter to include pre-visit review of records, face-to-face time with the patient and post visit documentation.

## 2022-11-05 ENCOUNTER — Other Ambulatory Visit: Payer: Self-pay | Admitting: Pulmonary Disease

## 2022-11-13 ENCOUNTER — Other Ambulatory Visit: Payer: Self-pay | Admitting: Nurse Practitioner

## 2022-11-16 ENCOUNTER — Ambulatory Visit (HOSPITAL_BASED_OUTPATIENT_CLINIC_OR_DEPARTMENT_OTHER): Payer: Medicare (Managed Care) | Admitting: Pulmonary Disease

## 2022-11-16 ENCOUNTER — Encounter (HOSPITAL_BASED_OUTPATIENT_CLINIC_OR_DEPARTMENT_OTHER): Payer: Self-pay | Admitting: Pulmonary Disease

## 2022-11-16 VITALS — BP 110/82 | HR 86 | Ht <= 58 in | Wt 143.0 lb

## 2022-11-16 DIAGNOSIS — R590 Localized enlarged lymph nodes: Secondary | ICD-10-CM | POA: Diagnosis not present

## 2022-11-16 DIAGNOSIS — J9611 Chronic respiratory failure with hypoxia: Secondary | ICD-10-CM | POA: Diagnosis not present

## 2022-11-16 DIAGNOSIS — J432 Centrilobular emphysema: Secondary | ICD-10-CM | POA: Diagnosis not present

## 2022-11-16 NOTE — Patient Instructions (Addendum)
X CT chest in  6months  Check on POC with DME

## 2022-11-16 NOTE — Assessment & Plan Note (Signed)
Subcarinal lymphadenopathy was noted on CT angiogram 07/2022 This is not present on my review of low-dose CT chest 09/2022 and was not mentioned in the report but again this was a noncontrast CT. Ideally she needs a contrast CT to better evaluate and we will arrange in 6 months

## 2022-11-16 NOTE — Assessment & Plan Note (Signed)
She is on triple therapy with budesonide/formoterol/Incruse combination with albuterol as needed for rescue. We discussed signs and symptoms of COPD exacerbation

## 2022-11-16 NOTE — Assessment & Plan Note (Signed)
She is back to using her oxygen tanks. Her POC is under warrantee and she will work with DME to return/fix it

## 2022-11-16 NOTE — Progress Notes (Signed)
   Subjective:    Patient ID: Monica Scott, female    DOB: October 08, 1955, 67 y.o.   MRN: [id number]  HPI  67 yo ex-smoker  followed for COPD and chronic hypoxic respiratory failure on oxygen since July 2019 -on Oxygen 2l/m rest and 3l/m with activity    PMH-  mild obstructive sleep apnea declined CPAP Neurogenic bladder with self-catheterization  Meds -Trelegy not working well  67-month follow-up visit. We qualified her for POC and she obtained a Simply Cough POC from DME.  Unfortunately this is not working well, battery lasts only for 30 minutes.  She has been using this at level for she has already returned 1 but this is still under warranty.  It cost her $90 to return it to DME. We reviewed CT angiogram chest from 07/2022 that was negative for PE but showed subcarinal 1.6 cm nodule. We also reviewed low-dose CT chest from 09/2022. 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    N PSG was done on February 13, 2019 that showed mild obstructive sleep apnea with AHI at 7.6 and SPO2 low at 73%.      Alpha-1 antitrypsin-MM, 139  Review of Systems neg for any significant sore throat, dysphagia, itching, sneezing, nasal congestion or excess/ purulent secretions, fever, chills, sweats, unintended wt loss, pleuritic or exertional cp, hempoptysis, orthopnea pnd or change in chronic leg swelling. Also denies presyncope, palpitations, heartburn, abdominal pain,  nausea, vomiting, diarrhea or change in bowel or urinary habits, dysuria,hematuria, rash, arthralgias, visual complaints, headache, numbness weakness or ataxia.     Objective:   Physical Exam  Gen. Pleasant, obese, in no distress ENT - no lesions, no post nasal drip Neck: No JVD, no thyromegaly, no carotid bruits Lungs: no use of accessory muscles, no dullness to percussion, decreased without rales or rhonchi  Cardiovascular: Rhythm regular, heart sounds  normal, no murmurs or gallops, no peripheral edema Musculoskeletal: No deformities, no cyanosis or clubbing , no tremors       Assessment & Plan:

## 2022-11-16 NOTE — Telephone Encounter (Signed)
D/C 07/13/22. Requested Prescriptions  Refused Prescriptions Disp Refills   KLOR-CON M20 20 MEQ tablet [Pharmacy Med Name: KLOR-CON M20 TABLET] 90 tablet 1    Sig: TAKE 1 TABLET BY MOUTH EVERY DAY FOR 5 DAYS     Endocrinology:  Minerals - Potassium Supplementation Failed - 11/13/2022  4:19 PM      Failed - K in normal range and within 360 days    Potassium  Date Value Ref Range Status  10/13/2022 3.4 (L) 3.5 - 5.2 mmol/L Final         Passed - Cr in normal range and within 360 days    Creatinine, Ser  Date Value Ref Range Status  10/13/2022 0.94 0.57 - 1.00 mg/dL Final         Passed - Valid encounter within last 12 months    Recent Outpatient Visits           1 month ago Essential hypertension   [place] [facility] [place], Shea Stakes, NP   4 months ago Primary hypertension   [place] [facility] [place], Shea Stakes, NP   7 months ago Prediabetes   [facility] [place], Shea Stakes, NP   9 months ago Prediabetes   [facility] [place], [place], [place]   1 year ago Essential hypertension   [place] [facility] [place], Shea Stakes, NP       Future Appointments             Today Oretha Milch, MD [facility] at [facility], [place]   In 2 months Claiborne Rigg, NP [facility]   In 2 months [place], Joan Mayans, NP [facility] Urogynecology at [facility], [facility]

## 2022-11-25 ENCOUNTER — Other Ambulatory Visit: Payer: Self-pay | Admitting: Nurse Practitioner

## 2022-11-25 DIAGNOSIS — G2581 Restless legs syndrome: Secondary | ICD-10-CM

## 2022-12-09 ENCOUNTER — Other Ambulatory Visit: Payer: Medicare (Managed Care)

## 2022-12-14 ENCOUNTER — Other Ambulatory Visit: Payer: Self-pay | Admitting: Nurse Practitioner

## 2022-12-14 DIAGNOSIS — K219 Gastro-esophageal reflux disease without esophagitis: Secondary | ICD-10-CM

## 2022-12-14 DIAGNOSIS — I10 Essential (primary) hypertension: Secondary | ICD-10-CM

## 2022-12-15 NOTE — Telephone Encounter (Signed)
Requested Prescriptions  Pending Prescriptions Disp Refills   olmesartan (BENICAR) 40 MG tablet [Pharmacy Med Name: OLMESARTAN MEDOXOMIL 40 MG TAB] 90 tablet 0    Sig: TAKE 1 TABLET BY MOUTH EVERY DAY     Cardiovascular:  Angiotensin Receptor Blockers Failed - 12/14/2022  2:46 AM      Failed - K in normal range and within 180 days    Potassium  Date Value Ref Range Status  10/13/2022 3.4 (L) 3.5 - 5.2 mmol/L Final         Passed - Cr in normal range and within 180 days    Creatinine, Ser  Date Value Ref Range Status  10/13/2022 0.94 0.57 - 1.00 mg/dL Final         Passed - Patient is not pregnant      Passed - Last BP in normal range    BP Readings from Last 1 Encounters:  11/16/22 110/82         Passed - Valid encounter within last 6 months    Recent Outpatient Visits           2 months ago Essential hypertension   [place] [facility] [place], Shea Stakes, NP   5 months ago Primary hypertension   [place] [facility] [place], Shea Stakes, NP   8 months ago Prediabetes   [facility] [place], Shea Stakes, NP   10 months ago Prediabetes   [facility] [place], Marzella [place]lein, [place]   1 year ago Essential hypertension   [place] [facility] Claiborne Rigg, NP       Future Appointments             In 1 month Claiborne Rigg, NP [facility]   In 1 month [place], Joan Mayans, NP [place] [facility] at [facility], [facility]             omeprazole (PRILOSEC) 40 MG capsule [Pharmacy Med Name: OMEPRAZOLE DR 40 MG CAPSULE] 180 capsule 0    Sig: TAKE 1 CAPSULE (40 MG TOTAL) BY MOUTH IN THE MORNING AND AT BEDTIME.     Gastroenterology: Proton Pump Inhibitors Passed - 12/14/2022  2:46 AM      Passed - Valid encounter within last 12 months    Recent Outpatient Visits           2 months ago Essential  hypertension   [place] [address] [facility] [place], Shea Stakes, NP   5 months ago Primary hypertension   [place] [place], Shea Stakes, NP   8 months ago Prediabetes   [facility] [place], Shea Stakes, NP   10 months ago Prediabetes   [facility] [place], Marzella [place], [place]   1 year ago Essential hypertension   [place] [facility] Claiborne Rigg, NP       Future Appointments             In 1 month Claiborne Rigg, NP [facility]   In 1 month [place], Joan Mayans, NP [facility]gynecology at [facility], [facility]

## 2023-01-03 ENCOUNTER — Other Ambulatory Visit: Payer: Self-pay | Admitting: Pulmonary Disease

## 2023-01-03 DIAGNOSIS — J449 Chronic obstructive pulmonary disease, unspecified: Secondary | ICD-10-CM

## 2023-01-07 ENCOUNTER — Telehealth: Payer: Self-pay | Admitting: Pulmonary Disease

## 2023-01-07 NOTE — Telephone Encounter (Signed)
New message  Patient asking for antibiotic C/o bad chest cold.    [facility] [address] [place]  phone # [phone]

## 2023-01-10 MED ORDER — PREDNISONE 10 MG PO TABS: ORAL_TABLET | ORAL | 0 refills | Status: DC

## 2023-01-10 MED ORDER — AZITHROMYCIN 250 MG PO TABS: ORAL_TABLET | ORAL | 0 refills | Status: DC

## 2023-01-10 NOTE — Telephone Encounter (Signed)
I called and spoke with the pt  She is c/o increased SOB, wheezing, cough with green sputum x 5 days  She denies any fevers or aches  She has not taken a covid test  She's using her duoneb, ventolin inhaler and symbicort  There was 1 opening at [facility] today but pt unable to come in and nothing open rest of the wk  Please advise, thanks!  Allergies  Allergen Reactions   Tizanidine Other (See Comments)    Caused dizziness and dry mouth. Patient prefers not to take this again.

## 2023-01-10 NOTE — Telephone Encounter (Signed)
Bad cough and wheezing started last week. Productive cough and it has green phlegm. Denies headache fever and sore throat.  She is asking for abx.

## 2023-01-10 NOTE — Telephone Encounter (Signed)
Suggest prednisone 10 mg, # 20, 4 X 2 DAYS, 3 X 2 DAYS, 2 X 2 DAYS, 1 X 2 DAYS                 Zpak 250 mg, # 6, 2 today then one daily

## 2023-01-10 NOTE — Telephone Encounter (Signed)
Tried calling the pt and there was no answer- LMTCB   I went ahead and [facility]

## 2023-01-10 NOTE — Telephone Encounter (Signed)
Patient checking on message for antibiotic. Patient phone number is [phone].Marland Kitchen

## 2023-01-17 ENCOUNTER — Ambulatory Visit: Payer: Medicare (Managed Care) | Attending: Nurse Practitioner | Admitting: Nurse Practitioner

## 2023-02-01 ENCOUNTER — Other Ambulatory Visit: Payer: Self-pay | Admitting: Nurse Practitioner

## 2023-02-01 ENCOUNTER — Other Ambulatory Visit: Payer: Self-pay | Admitting: Family Medicine

## 2023-02-01 ENCOUNTER — Other Ambulatory Visit: Payer: Self-pay | Admitting: Pulmonary Disease

## 2023-02-01 DIAGNOSIS — G2581 Restless legs syndrome: Secondary | ICD-10-CM

## 2023-02-01 DIAGNOSIS — I10 Essential (primary) hypertension: Secondary | ICD-10-CM

## 2023-02-01 NOTE — Telephone Encounter (Unsigned)
Copied from CRM [id number]. Topic: General - Other >> Feb 01, 2023  8:12 AM Everette C wrote: Reason for CRM: Medication Refill - Medication: olmesartan (BENICAR) 40 MG tablet [[id number]]  Has the patient contacted their pharmacy? Yes.   (Agent: If no, request that the patient contact the pharmacy for the refill. If patient does not wish to contact the pharmacy document the reason why and proceed with request.) (Agent: If yes, when and what did the pharmacy advise?)  Preferred Pharmacy (with phone number or street name): [id number] [place], [place] - [address] [place] [address] Phone: [phone] Fax: [phone] Hours: Not open 24 hours   Has the patient been seen for an appointment in the last year OR does the patient have an upcoming appointment? Yes.    Agent: Please be advised that RX refills may take up to 3 business days. We ask that you follow-up with your pharmacy.

## 2023-02-02 NOTE — Telephone Encounter (Signed)
Refused Benicar 40 mg because it's being requested too soon.  Has enough to last until appt. On 11/25 with Bertram Denver, NP.  Requested Prescriptions  Pending Prescriptions Disp Refills   olmesartan (BENICAR) 40 MG tablet 90 tablet 0    Sig: Take 1 tablet (40 mg total) by mouth daily.     Cardiovascular:  Angiotensin Receptor Blockers Failed - 02/01/2023  8:20 AM      Failed - K in normal range and within 180 days    Potassium  Date Value Ref Range Status  10/13/2022 3.4 (L) 3.5 - 5.2 mmol/L Final         Passed - Cr in normal range and within 180 days    Creatinine, Ser  Date Value Ref Range Status  10/13/2022 0.94 0.57 - 1.00 mg/dL Final         Passed - Patient is not pregnant      Passed - Last BP in normal range    BP Readings from Last 1 Encounters:  11/16/22 110/82         Passed - Valid encounter within last 6 months    Recent Outpatient Visits           3 months ago Essential hypertension   [place] [facility] [place]. [facility] Claiborne Rigg, NP   6 months ago Primary hypertension   [place] [facility] [place] - A Dept Of [place]. [facility] Claiborne Rigg, NP   9 months ago Prediabetes   [place] [facility] [place] - A Dept Of [place]. [facility] Claiborne Rigg, NP   1 year ago Prediabetes   [place] [facility] [place] - A Dept Of [place]. [facility] [place], Marzella [place], [place]   1 year ago Essential hypertension   [place] [facility] [place] - A Dept Of . [facility] [place] Ide, Shea Stakes, NP       Future Appointments             Tomorrow Cordelia Pen, Joan Mayans, NP [place] [facility] at [facility], [facility]   In 2 weeks Claiborne Rigg, NP [facility] [place] - A Dept Of Eligha Bridegroom. [facility]

## 2023-02-03 ENCOUNTER — Other Ambulatory Visit: Payer: Self-pay | Admitting: Family Medicine

## 2023-02-03 ENCOUNTER — Encounter: Payer: Self-pay | Admitting: Obstetrics and Gynecology

## 2023-02-03 ENCOUNTER — Other Ambulatory Visit: Payer: Self-pay | Admitting: Nurse Practitioner

## 2023-02-03 ENCOUNTER — Ambulatory Visit: Payer: Medicare (Managed Care) | Admitting: Obstetrics and Gynecology

## 2023-02-03 VITALS — BP 120/76 | HR 123

## 2023-02-03 DIAGNOSIS — I10 Essential (primary) hypertension: Secondary | ICD-10-CM

## 2023-02-03 DIAGNOSIS — N952 Postmenopausal atrophic vaginitis: Secondary | ICD-10-CM

## 2023-02-03 DIAGNOSIS — N813 Complete uterovaginal prolapse: Secondary | ICD-10-CM

## 2023-02-03 DIAGNOSIS — N993 Prolapse of vaginal vault after hysterectomy: Secondary | ICD-10-CM

## 2023-02-03 DIAGNOSIS — N8110 Cystocele, unspecified: Secondary | ICD-10-CM

## 2023-02-03 NOTE — Telephone Encounter (Signed)
Requested Prescriptions  Refused Prescriptions Disp Refills   amLODipine (NORVASC) 10 MG tablet [Pharmacy Med Name: AMLODIPINE BESYLATE 10 MG TAB] 90 tablet 1    Sig: TAKE 1 TABLET BY MOUTH EVERY DAY     Cardiovascular: Calcium Channel Blockers 2 Failed - 02/03/2023  1:50 PM      Failed - Last Heart Rate in normal range    Pulse Readings from Last 1 Encounters:  02/03/23 (!) 123         Passed - Last BP in normal range    BP Readings from Last 1 Encounters:  02/03/23 120/76         Passed - Valid encounter within last 6 months    Recent Outpatient Visits           3 months ago Essential hypertension   [place] [facility] - A Dept Of [place]. [facility] Claiborne Rigg, NP   6 months ago Primary hypertension   [place] [facility] [place] - A Dept Of [place]. [facility] Claiborne Rigg, NP   9 months ago Prediabetes   [place] [facility] [place] - A Dept Of [place]. [facility] Claiborne Rigg, NP   1 year ago Prediabetes   [place] [facility] [place] - A Dept Of [place]. [facility] [place], Marzella [place], [place]   1 year ago Essential hypertension   [place] [facility] [place] - A Dept Of [place]. [facility] Claiborne Rigg, NP       Future Appointments             In 2 weeks Claiborne Rigg, NP [place] [place] - A Dept Of [place]igha Bridegroom. [facility]   In 3 months [place]ule[place], Joan Mayans, NP [facility] Health Urogynecology at [facility], [facility]

## 2023-02-03 NOTE — Telephone Encounter (Signed)
Requested Prescriptions  Pending Prescriptions Disp Refills   amLODipine (NORVASC) 5 MG tablet [Pharmacy Med Name: AMLODIPINE BESYLATE 5 MG TAB] 180 tablet 0    Sig: TAKE 1 TABLET BY MOUTH TWICE A DAY     Cardiovascular: Calcium Channel Blockers 2 Failed - 02/03/2023  1:50 PM      Failed - Last Heart Rate in normal range    Pulse Readings from Last 1 Encounters:  02/03/23 (!) 123         Passed - Last BP in normal range    BP Readings from Last 1 Encounters:  02/03/23 120/76         Passed - Valid encounter within last 6 months    Recent Outpatient Visits           3 months ago Essential hypertension   [place] [facility] [place] - A Dept Of [place]. [facility] Claiborne Rigg, NP   6 months ago Primary hypertension   [place] [facility] [place] - A Dept Of [place]. [facility] Claiborne Rigg, NP   9 months ago Prediabetes   [place] [facility] [place] - A Dept Of [place]. [facility] Claiborne Rigg, NP   1 year ago Prediabetes   [place] [facility] [place] - A Dept Of [place]. [facility] [place], Marzella [place], [place]   1 year ago Essential hypertension   [place] [facility] [place] - A Dept Of [place]. [facility] [place] Claiborne Rigg, NP       Future Appointments             In 2 weeks Claiborne Rigg, NP [facility] [place] - A Dept Of Eligha Bridegroom. [facility]   In 3 months Zuleta, Joan Mayans, NP [facility] [place] [facility]rogynecology at [facility], [facility]

## 2023-02-03 NOTE — Progress Notes (Signed)
[facility]   Subjective:     Chief Complaint:  Chief Complaint  Patient presents with   Follow-up    Monica Scott is a 67 y.o. female here today for a pessary cleaning.    History of Present Illness: Monica Scott is a 67 y.o. female with stage III pelvic organ prolapse who presents for a pessary check. She is using a size #3 ring with support pessary. The pessary has been working well and she has no complaints. She is using vaginal estrogen cream but not consistently. She denies vaginal bleeding.  Past Medical History: Patient  has a past medical history of Arthritis, Asthma, Bladder disease, COPD (chronic obstructive pulmonary disease) (HCC), Depression, GERD (gastroesophageal reflux disease), Hyperlipidemia, Hypertension, Peptic ulcer, and Urinary retention.   Past Surgical History: She  has a past surgical history that includes Vaginal hysterectomy; Pilonidal cyst excision; Colonoscopy with propofol (N/A, 04/24/2018); Esophagogastroduodenoscopy (egd) with propofol (N/A, 04/24/2018); maloney dilation (N/A, 04/24/2018); biopsy (04/24/2018); polypectomy (04/24/2018); Colonoscopy with propofol (N/A, 09/21/2021); and polypectomy (09/21/2021).   Medications: She has a current medication list which includes the following prescription(s): acetaminophen, albuterol, amlodipine, aspirin ec, azithromycin, budesonide-formoterol, bupropion, estradiol, estradiol, fluoxetine, gabapentin, guaifenesin, ibuprofen, ipratropium-albuterol, methocarbamol, olanzapine, olmesartan, omeprazole, prednisone, rosuvastatin, trazodone, and incruse ellipta.   Allergies: Patient is allergic to tizanidine.   Social History: Patient  reports that she quit smoking about 12 years ago. Her smoking use included cigarettes. She started smoking about 52 years ago. She has a 60 pack-year smoking history. She has never been exposed to tobacco smoke. She has never used smokeless tobacco. She reports that she  does not drink alcohol and does not use drugs.      Objective:    Physical Exam: BP 120/76   Pulse (!) 123  Gen: No apparent distress, A&O x 3. Detailed Urogynecologic Evaluation:  Pelvic Exam: Normal external female genitalia; Bartholin's and Skene's glands normal in appearance; urethral meatus normal in appearance, no urethral masses or discharge. The pessary was noted to be in place. It was removed and cleaned. Speculum exam revealed healed excoriations at the apex that were previously treated. The pessary was replaced. It was comfortable to the patient and fit well.    Assessment/Plan:    Assessment: Monica Scott is a 67 y.o. with stage III pelvic organ prolapse here for a pessary check. She is doing well.  Plan: She will keep the pessary in place until next visit. She will continue to use estrogen. She will follow-up in 3 months for a pessary check or sooner as needed.  All questions were answered.

## 2023-02-09 ENCOUNTER — Telehealth: Payer: Self-pay | Admitting: Pulmonary Disease

## 2023-02-09 DIAGNOSIS — R051 Acute cough: Secondary | ICD-10-CM

## 2023-02-09 NOTE — Telephone Encounter (Signed)
Spoke with patient regarding prior message . Patient stated she still has a cough and greens mucus,0 fever this has been going on for 1 week .  Dr.Alva can you please advise.  Thank you

## 2023-02-09 NOTE — Telephone Encounter (Signed)
Patient states having symptoms of cough and mucus. [facility] Patient phone number is [phone].

## 2023-02-10 MED ORDER — DOXYCYCLINE HYCLATE 100 MG PO TABS: 100.0000 mg | ORAL_TABLET | Freq: Two times a day (BID) | ORAL | 0 refills | Status: DC

## 2023-02-10 NOTE — Telephone Encounter (Signed)
Spoke with patient regarding prior message.  Advised patient per Dr.Alva ok to send in Doxycycline 100 mg twice daily for 7 days Mucinex as needed  Patient's voice was understanding.Nothing else further needed.

## 2023-02-13 ENCOUNTER — Other Ambulatory Visit: Payer: Self-pay | Admitting: Pulmonary Disease

## 2023-02-21 ENCOUNTER — Ambulatory Visit: Payer: Medicare (Managed Care) | Attending: Nurse Practitioner | Admitting: Nurse Practitioner

## 2023-02-21 ENCOUNTER — Ambulatory Visit: Payer: Medicare (Managed Care)

## 2023-02-21 ENCOUNTER — Encounter: Payer: Self-pay | Admitting: Nurse Practitioner

## 2023-02-21 ENCOUNTER — Ambulatory Visit: Payer: Medicare (Managed Care) | Admitting: Adult Health

## 2023-02-21 ENCOUNTER — Encounter: Payer: Self-pay | Admitting: Adult Health

## 2023-02-21 VITALS — BP 143/79 | HR 93 | Ht <= 58 in | Wt 149.0 lb

## 2023-02-21 VITALS — BP 147/82 | HR 83 | Temp 98.2°F | Ht <= 58 in | Wt 148.6 lb

## 2023-02-21 DIAGNOSIS — E7800 Pure hypercholesterolemia, unspecified: Secondary | ICD-10-CM | POA: Diagnosis not present

## 2023-02-21 DIAGNOSIS — J9611 Chronic respiratory failure with hypoxia: Secondary | ICD-10-CM

## 2023-02-21 DIAGNOSIS — I10 Essential (primary) hypertension: Secondary | ICD-10-CM | POA: Diagnosis not present

## 2023-02-21 DIAGNOSIS — I6523 Occlusion and stenosis of bilateral carotid arteries: Secondary | ICD-10-CM

## 2023-02-21 DIAGNOSIS — J441 Chronic obstructive pulmonary disease with (acute) exacerbation: Secondary | ICD-10-CM | POA: Diagnosis not present

## 2023-02-21 DIAGNOSIS — M8580 Other specified disorders of bone density and structure, unspecified site: Secondary | ICD-10-CM

## 2023-02-21 DIAGNOSIS — E876 Hypokalemia: Secondary | ICD-10-CM | POA: Diagnosis not present

## 2023-02-21 DIAGNOSIS — I700 Atherosclerosis of aorta: Secondary | ICD-10-CM | POA: Diagnosis not present

## 2023-02-21 DIAGNOSIS — R197 Diarrhea, unspecified: Secondary | ICD-10-CM

## 2023-02-21 MED ORDER — OHTUVAYRE 3 MG/2.5ML IN SUSP: 1.0000 | Freq: Two times a day (BID) | RESPIRATORY_TRACT | 5 refills | Status: DC

## 2023-02-21 MED ORDER — DIPHENOXYLATE-ATROPINE 2.5-0.025 MG PO TABS: 1.0000 | ORAL_TABLET | Freq: Four times a day (QID) | ORAL | 0 refills | Status: DC | PRN

## 2023-02-21 MED ORDER — ROSUVASTATIN CALCIUM 40 MG PO TABS: 40.0000 mg | ORAL_TABLET | Freq: Every day | ORAL | 3 refills | Status: DC

## 2023-02-21 MED ORDER — PREDNISONE 10 MG PO TABS: ORAL_TABLET | ORAL | 0 refills | Status: DC

## 2023-02-21 MED ORDER — TRELEGY ELLIPTA 200-62.5-25 MCG/ACT IN AEPB: 1.0000 | INHALATION_SPRAY | Freq: Every day | RESPIRATORY_TRACT | 5 refills | Status: DC

## 2023-02-21 MED ORDER — POTASSIUM CHLORIDE CRYS ER 20 MEQ PO TBCR: 20.0000 meq | EXTENDED_RELEASE_TABLET | Freq: Two times a day (BID) | ORAL | 1 refills | Status: DC

## 2023-02-21 MED ORDER — OLMESARTAN MEDOXOMIL 40 MG PO TABS: 40.0000 mg | ORAL_TABLET | Freq: Every day | ORAL | 1 refills | Status: DC

## 2023-02-21 MED ORDER — ASPIRIN 81 MG PO TBEC: 81.0000 mg | DELAYED_RELEASE_TABLET | Freq: Every day | ORAL | 3 refills | Status: AC

## 2023-02-21 NOTE — Progress Notes (Signed)
Assessment & Plan:  Monica Scott "Monica Scott" was seen today for medical management of chronic issues and diarrhea.  Diagnoses and all orders for this visit:  Primary hypertension -     CMP14+EGFR -     olmesartan (BENICAR) 40 MG tablet; Take 1 tablet (40 mg total) by mouth daily. Continue all antihypertensives as prescribed.  Reminded to bring in blood pressure log for follow  up appointment.  RECOMMENDATIONS: DASH/Mediterranean Diets are healthier choices for HTN.    Hypercholesterolemia -     rosuvastatin (CRESTOR) 40 MG tablet; Take 1 tablet (40 mg total) by mouth daily. For cholesterol -     Lipid panel INSTRUCTIONS: Work on a low fat, heart healthy diet and participate in regular aerobic exercise program by working out at least 150 minutes per week; 5 days a week-30 minutes per day. Avoid red meat/beef/steak,  fried foods. junk foods, sodas, sugary drinks, unhealthy snacking, alcohol and smoking.  Drink at least 80 oz of water per day and monitor your carbohydrate intake daily.    Aortic atherosclerosis (HCC) -     rosuvastatin (CRESTOR) 40 MG tablet; Take 1 tablet (40 mg total) by mouth daily. For cholesterol  Hypokalemia -     potassium chloride SA (KLOR-CON M) 20 MEQ tablet; Take 1 tablet (20 mEq total) by mouth 2 (two) times daily.  Diarrhea, unspecified type -     diphenoxylate-atropine (LOMOTIL) 2.5-0.025 MG tablet; Take 1 tablet by mouth 4 (four) times daily as needed for diarrhea or loose stools. -     Cdiff NAA+O+P+Stool Culture  Osteopenia, unspecified location Instructed to stop fosamax.  -     VITAMIN D 25 Hydroxy (Vit-D Deficiency, Fractures)  Bilateral carotid artery stenosis -     aspirin EC 81 MG tablet; Take 1 tablet (81 mg total) by mouth daily. Swallow whole.    Patient has been counseled on age-appropriate routine health concerns for screening and prevention. These are reviewed and up-to-date. Referrals have been placed accordingly. Immunizations are  up-to-date or declined.    Subjective:   Chief Complaint  Patient presents with   Medical Management of Chronic Issues   Diarrhea    Monica Scott 67 y.o. female presents to office today for follow up to HTN  She has a past medical history of Arthritis, Asthma, Bladder disease, COPD (followed by pulmonology) , Depression, GERD, Hyperlipidemia, Hypertension, Peptic ulcer, and Urinary retention.    She has been experiencing Diarrhea for 1 month. Imodium ineffective. She does not consume alcohol. There is no associated abd pain, fever, N/V, melena, hematochezia.   HTN Blood pressure is elevated today. She is prescribed benicar and amlodipine. Endorses adherence.  BP Readings from Last 3 Encounters:  02/21/23 (!) 143/79  02/21/23 (!) 147/82  02/03/23 120/76      Review of Systems  Constitutional:  Negative for fever, malaise/fatigue and weight loss.  HENT: Negative.  Negative for nosebleeds.   Eyes: Negative.  Negative for blurred vision, double vision and photophobia.  Respiratory: Negative.  Negative for cough and shortness of breath.   Cardiovascular: Negative.  Negative for chest pain, palpitations and leg swelling.  Gastrointestinal:  Positive for diarrhea. Negative for abdominal pain, blood in stool, constipation, heartburn, melena, nausea and vomiting.  Musculoskeletal: Negative.  Negative for myalgias.  Neurological: Negative.  Negative for dizziness, focal weakness, seizures and headaches.  Psychiatric/Behavioral: Negative.  Negative for suicidal ideas.     Past Medical History:  Diagnosis Date   Arthritis  Asthma    Bladder disease    COPD (chronic obstructive pulmonary disease) (HCC)    Depression    GERD (gastroesophageal reflux disease)    Hyperlipidemia    Hypertension    Peptic ulcer    Urinary retention     Past Surgical History:  Procedure Laterality Date   BIOPSY  04/24/2018   Procedure: BIOPSY;  Surgeon: Corbin Ade, MD;  [facility];  Service: Endoscopy;;  esophagus   COLONOSCOPY WITH PROPOFOL N/A 04/24/2018   Procedure: COLONOSCOPY WITH PROPOFOL;  Surgeon: Corbin Ade, MD;  [facility];  Service: Endoscopy;  Laterality: N/A;  9:00am   COLONOSCOPY WITH PROPOFOL N/A 09/21/2021   Procedure: COLONOSCOPY WITH PROPOFOL;  Surgeon: Corbin Ade, MD;  [facility];  Service: Endoscopy;  Laterality: N/A;  2:15 / ASA 2   ESOPHAGOGASTRODUODENOSCOPY (EGD) WITH PROPOFOL N/A 04/24/2018   Procedure: ESOPHAGOGASTRODUODENOSCOPY (EGD) WITH PROPOFOL;  Surgeon: Corbin Ade, MD;  [facility];  Service: Endoscopy;  Laterality: N/A;   MALONEY DILATION N/A 04/24/2018   Procedure: Elease Hashimoto DILATION;  Surgeon: Corbin Ade, MD;  [facility];  Service: Endoscopy;  Laterality: N/A;   PILONIDAL CYST EXCISION     age 67   POLYPECTOMY  04/24/2018   Procedure: POLYPECTOMY;  Surgeon: Corbin Ade, MD;  [facility];  Service: Endoscopy;;  colon    POLYPECTOMY  09/21/2021   Procedure: POLYPECTOMY;  Surgeon: Corbin Ade, MD;  [facility];  Service: Endoscopy;;   VAGINAL HYSTERECTOMY      Family History  Problem Relation Age of Onset   Alcohol abuse Mother    Arthritis Mother    Cancer Mother    Depression Mother    Early death Mother    Alcohol abuse Father    Depression Father    Alcohol abuse Sister    Depression Sister    Alcohol abuse Daughter    Depression Daughter    COPD Maternal Grandfather    Heart disease Maternal Grandfather    Hypertension Maternal Grandfather    Alcohol abuse Brother    Colon cancer Brother        deceased at age 64   Alcohol abuse Son    Breast cancer Neg Hx     Social History Reviewed with no changes to be made today.   Outpatient Medications Prior to Visit  Medication Sig Dispense Refill   acetaminophen (TYLENOL) 325 MG tablet Take 650 mg by mouth every 6 (six) hours as needed for mild pain.     albuterol  (VENTOLIN HFA) 108 (90 Base) MCG/ACT inhaler INHALE 1-2 PUFFS EVERY 6 HOURS AS NEEDED FOR WHEEZE OR SHORTNESS OF BREATH **NEED APPOINTMENT 6.7 each 1   amLODipine (NORVASC) 10 MG tablet Take 1 tablet (10 mg total) by mouth daily. 90 tablet 1   azithromycin (ZITHROMAX) 250 MG tablet 2 today, then one daily until gone 6 tablet 0   buPROPion (WELLBUTRIN XL) 150 MG 24 hr tablet Take 1 tablet (150 mg total) by mouth at bedtime. 90 tablet 1   Ensifentrine (OHTUVAYRE) 3 MG/2.5ML SUSP Inhale 1 each into the lungs in the morning and at bedtime. 150 mL 5   estradiol (ESTRACE) 0.1 MG/GM vaginal cream PLACE 0.5 G VAGINALLY 2 (TWO) TIMES A WEEK. PLACE 0.5G NIGHTLY FOR TWO WEEKS THEN TWICE A WEEK AFTER 42.5 g 11   estradiol (ESTRING) 7.5 MCG/24HR vaginal ring Place 1 each  vaginally every 3 (three) months. 1 each 3   FLUoxetine (PROZAC) 20 MG capsule Take 60 mg by mouth every morning.     Fluticasone-Umeclidin-Vilant (TRELEGY ELLIPTA) 200-62.5-25 MCG/ACT AEPB Inhale 1 puff into the lungs daily. 1 each 5   gabapentin (NEURONTIN) 400 MG capsule Take 1 capsule (400 mg total) by mouth 3 (three) times daily. (Patient taking differently: Take 800 mg by mouth at bedtime.) 90 capsule 6   ibuprofen (ADVIL,MOTRIN) 200 MG tablet Take 400 mg by mouth daily as needed for moderate pain.     ipratropium-albuterol (DUONEB) 0.5-2.5 (3) MG/3ML SOLN USE 1 AMPULE IN NEBULIZER EVERY 6 HOURS AS NEEDED 360 mL 5   methocarbamol (ROBAXIN) 500 MG tablet TAKE 1 TABLET BY MOUTH 2 TIMES DAILY AS NEEDED FOR MUSCLE SPASMS (NOT COVERED BY INS.) 40 tablet 0   OLANZapine (ZYPREXA) 10 MG tablet Take 10 mg by mouth at bedtime.      omeprazole (PRILOSEC) 40 MG capsule TAKE 1 CAPSULE (40 MG TOTAL) BY MOUTH IN THE MORNING AND AT BEDTIME. 180 capsule 0   predniSONE (DELTASONE) 10 MG tablet 2 tabs daily for 7 days, then 1 tab daily for 7 days, 1/2 tab daily for 1 week then stop 20 tablet 0   rosuvastatin (CRESTOR) 40 MG tablet Take 1 tablet (40 mg total)  by mouth daily. For cholesterol 90 tablet 3   traZODone (DESYREL) 100 MG tablet Take 100 mg by mouth at bedtime. (Patient not taking: Reported on 02/21/2023)     aspirin EC 81 MG tablet Take 1 tablet (81 mg total) by mouth daily. Swallow whole. (Patient not taking: Reported on 02/21/2023) 90 tablet 3   doxycycline (VIBRA-TABS) 100 MG tablet Take 1 tablet (100 mg total) by mouth 2 (two) times daily. (Patient not taking: Reported on 02/21/2023) 14 tablet 0   guaiFENesin (MUCINEX) 600 MG 12 hr tablet Take 1 tablet (600 mg total) by mouth 2 (two) times daily. (Patient not taking: Reported on 02/21/2023) 30 tablet 1   olmesartan (BENICAR) 40 MG tablet TAKE 1 TABLET BY MOUTH EVERY DAY (Patient not taking: Reported on 02/21/2023) 90 tablet 0   No facility-administered medications prior to visit.    Allergies  Allergen Reactions   Tizanidine Other (See Comments)    Caused dizziness and dry mouth. Patient prefers not to take this again.       Objective:    BP (!) 143/79 (BP Location: Left Arm, Patient Position: Sitting, Cuff Size: Normal)   Pulse 93 Comment: with patient O2  Ht 4\' 9"  (1.448 m)   Wt 149 lb (67.6 kg)   SpO2 96% Comment: With our in house O2  BMI 32.24 kg/m  Wt Readings from Last 3 Encounters:  02/21/23 149 lb (67.6 kg)  02/21/23 148 lb 9.6 oz (67.4 kg)  11/16/22 143 lb (64.9 kg)    Physical Exam Vitals and nursing note reviewed.  Constitutional:      Appearance: She is well-developed.  HENT:     Head: Normocephalic and atraumatic.  Cardiovascular:     Rate and Rhythm: Normal rate and regular rhythm.     Heart sounds: Normal heart sounds. No murmur heard.    No friction rub. No gallop.  Pulmonary:     Effort: Pulmonary effort is normal. No tachypnea or respiratory distress.     Breath sounds: Normal breath sounds. No decreased breath sounds, wheezing, rhonchi or rales.  Chest:     Chest wall: No tenderness.  Abdominal:  General: Bowel sounds are normal.      Palpations: Abdomen is soft.  Musculoskeletal:        General: Normal range of motion.     Cervical back: Normal range of motion.  Skin:    General: Skin is warm and dry.  Neurological:     Mental Status: She is alert and oriented to person, place, and time.     Coordination: Coordination normal.  Psychiatric:        Behavior: Behavior normal. Behavior is cooperative.        Thought Content: Thought content normal.        Judgment: Judgment normal.          Patient has been counseled extensively about nutrition and exercise as well as the importance of adherence with medications and regular follow-up. The patient was given clear instructions to go to ER or return to medical center if symptoms don't improve, worsen or new problems develop. The patient verbalized understanding.   Follow-up: Return in about 3 months (around 05/24/2023).   Claiborne Rigg, FNP-BC [facility], [place] [phone]   02/21/2023, 10:37 PM

## 2023-02-21 NOTE — Patient Instructions (Addendum)
Calcium 1000-1200mg  Vitamin D 800-1000mg 

## 2023-02-21 NOTE — Patient Instructions (Addendum)
Chest xray today  Change Breyna and Incruse -to Trelegy 1 puff daily  Albuterol inhaler or Duoneb As needed   Begin Ohtuvayre neb Twice daily   Begin Prednisone 20mg  daily for 1 week then 10mg  daily for 1 week  and 5 mg daily for 1 week and then stop.  Continue on Oxygen 3l/m rest and 4l/m with activity  Follow up with Dr. Vassie Loll  in 6-8 weeks with Spirometry with DLCO and As needed   Please contact office for sooner follow up if symptoms do not improve or worsen or seek emergency care

## 2023-02-21 NOTE — Progress Notes (Signed)
@Patient  ID: Rupert Stacks, female    DOB: [birth year], 67 y.o.   MRN: [id number]  Chief Complaint  Patient presents with   Follow-up   Discussed the use of AI scribe software for clinical note transcription with the patient, who gave verbal consent to proceed  Referring provider: Claiborne Rigg, NP  HPI: 67 year old female former smoker followed for COPD and chronic hypoxic respiratory failure on oxygen (started July 2019) History of mild obstructive sleep apnea declined CPAP.  Has neurogenic bladder requiring self-catheterization Participates in the lung cancer CT screening program  TEST/EVENTS :  PFTs 09/2019 -severe airway obstruction, ratio 51, FEV1 46%, no bronchodilator response, TLC 138% showing hyperinflation, DLCO 73%   LDCT 09/2022 >> mild emphysema, postinfectious scarring, stable nodules  LDCT 09/2021 >> RADS-2  LDCT 02/2020 RADS 1, no suspicious nodules noted.,  Emphysema   CT chest 04/2018 shows changes of emphysema, mild ectasia of thoracic aorta 3.5 cm   2D echo February 13, 2019 showed a normal EF at 60 to 65%.    NPSG was done on February 13, 2019 that showed mild obstructive sleep apnea with AHI at 7.6 and SPO2 low at 73%.    Alpha-1 antitrypsin-MM, 139  CT chest October 11, 2022-small scattered pulmonary nodules stable, no suspicious pulmonary nodules.  Mild emphysema.  Scattered areas of chronic postinfectious/inflammatory scarring bibasilar-lung RADS 2S  02/21/2023 Acute OV : COPD  Patient presents for an acute office visit. A 67 year old former smoker with a history of COPD and chronic hypoxic respiratory failure on oxygen therapy presents with worsening respiratory symptoms. The patient reports a significant cold approximately a month and a half ago, which has since evolved into breathlessness, to the point of being bedbound due to difficulty breathing initially. Despite some improvement, the patient has not returned to her baseline respiratory status. She was  called in Zpack and Steroid taper initially. Got some better but symptoms have been lingering. She was called on 7 day course of Doxycycline that she completed few days ago. Cough and congestion are better . She remains short of breath with decreased activity tolerance. No hemoptysis, chest pain, orthopnea or edema.   The patient is currently on Incruse and [place] for COPD management, but due to insurance issues, wishes to switch back to Trelegy. Says she wants to go back on Trelegy , covered better on her insurance.   The patient denies fever, productive cough, hemoptysis, vomiting, calf pain, and chest pain.  She remains on Oxygen. Uses up to 4l/m with activity. On Oxygen 3lm at rest.  Appetite is good.   Allergies  Allergen Reactions   Tizanidine Other (See Comments)    Caused dizziness and dry mouth. Patient prefers not to take this again.    Immunization History  Administered Date(s) Administered   Fluad Quad(high Dose 65+) 02/06/2021   Fluad Trivalent(High Dose 65+) 12/22/2022   Influenza,inj,Quad PF,6+ Mos 12/06/2017, 11/28/2018, 01/03/2020, 01/27/2022   PFIZER(Purple Top)SARS-COV-2 Vaccination 06/24/2019, 07/08/2019, 03/07/2020   PNEUMOCOCCAL CONJUGATE-20 03/09/2021   Pfizer(Comirnaty)Fall Seasonal Vaccine 12 years and older 12/22/2022   Tdap 01/07/2021   Zoster Recombinant(Shingrix) 09/07/2021, 04/13/2022    Past Medical History:  Diagnosis Date   Arthritis    Asthma    Bladder disease    COPD (chronic obstructive pulmonary disease) (HCC)    Depression    GERD (gastroesophageal reflux disease)    Hyperlipidemia    Hypertension    Peptic ulcer    Urinary retention     Tobacco History:  Social History   Tobacco Use  Smoking Status Former   Current packs/day: 0.00   Average packs/day: 1.5 packs/day for 40.0 years (60.0 ttl pk-yrs)   Types: Cigarettes   Start date: 04/19/1970   Quit date: 04/19/2010   Years since quitting: 12.8   Passive exposure: Never  Smokeless  Tobacco Never   Counseling given: Not Answered   Outpatient Medications Prior to Visit  Medication Sig Dispense Refill   acetaminophen (TYLENOL) 325 MG tablet Take 650 mg by mouth every 6 (six) hours as needed for mild pain.     albuterol (VENTOLIN HFA) 108 (90 Base) MCG/ACT inhaler INHALE 1-2 PUFFS EVERY 6 HOURS AS NEEDED FOR WHEEZE OR SHORTNESS OF BREATH **NEED APPOINTMENT 6.7 each 1   amLODipine (NORVASC) 10 MG tablet Take 1 tablet (10 mg total) by mouth daily. 90 tablet 1   aspirin EC 81 MG tablet Take 1 tablet (81 mg total) by mouth daily. Swallow whole. 90 tablet 3   azithromycin (ZITHROMAX) 250 MG tablet 2 today, then one daily until gone 6 tablet 0   BREYNA 80-4.5 MCG/ACT inhaler INHALE 2 PUFFS INTO THE LUNGS TWICE A DAY 30.9 each 2   buPROPion (WELLBUTRIN XL) 150 MG 24 hr tablet Take 1 tablet (150 mg total) by mouth at bedtime. 90 tablet 1   doxycycline (VIBRA-TABS) 100 MG tablet Take 1 tablet (100 mg total) by mouth 2 (two) times daily. 14 tablet 0   estradiol (ESTRACE) 0.1 MG/GM vaginal cream PLACE 0.5 G VAGINALLY 2 (TWO) TIMES A WEEK. PLACE 0.5G NIGHTLY FOR TWO WEEKS THEN TWICE A WEEK AFTER 42.5 g 11   estradiol (ESTRING) 7.5 MCG/24HR vaginal ring Place 1 each vaginally every 3 (three) months. 1 each 3   FLUoxetine (PROZAC) 20 MG capsule Take 60 mg by mouth every morning.     gabapentin (NEURONTIN) 400 MG capsule Take 1 capsule (400 mg total) by mouth 3 (three) times daily. (Patient taking differently: Take 800 mg by mouth at bedtime.) 90 capsule 6   guaiFENesin (MUCINEX) 600 MG 12 hr tablet Take 1 tablet (600 mg total) by mouth 2 (two) times daily. 30 tablet 1   ibuprofen (ADVIL,MOTRIN) 200 MG tablet Take 400 mg by mouth daily as needed for moderate pain.     ipratropium-albuterol (DUONEB) 0.5-2.5 (3) MG/3ML SOLN USE 1 AMPULE IN NEBULIZER EVERY 6 HOURS AS NEEDED 360 mL 5   methocarbamol (ROBAXIN) 500 MG tablet TAKE 1 TABLET BY MOUTH 2 TIMES DAILY AS NEEDED FOR MUSCLE SPASMS (NOT  COVERED BY INS.) 40 tablet 0   OLANZapine (ZYPREXA) 10 MG tablet Take 10 mg by mouth at bedtime.      olmesartan (BENICAR) 40 MG tablet TAKE 1 TABLET BY MOUTH EVERY DAY 90 tablet 0   omeprazole (PRILOSEC) 40 MG capsule TAKE 1 CAPSULE (40 MG TOTAL) BY MOUTH IN THE MORNING AND AT BEDTIME. 180 capsule 0   predniSONE (DELTASONE) 10 MG tablet 4 x 2 days, 3 x 2 days, 2 x 2 days, 1 x 2 days then stop 20 tablet 0   rosuvastatin (CRESTOR) 40 MG tablet Take 1 tablet (40 mg total) by mouth daily. For cholesterol 90 tablet 3   traZODone (DESYREL) 100 MG tablet Take 100 mg by mouth at bedtime.     umeclidinium bromide (INCRUSE ELLIPTA) 62.5 MCG/ACT AEPB TAKE 1 PUFF BY MOUTH EVERY DAY 30 each 5   No facility-administered medications prior to visit.     Review of Systems:   Constitutional:  No  weight loss, night sweats,  Fevers, chills, +fatigue, or  lassitude.  HEENT:   No headaches,  Difficulty swallowing,  Tooth/dental problems, or  Sore throat,                No sneezing, itching, ear ache, nasal congestion, post nasal drip,   CV:  No chest pain,  Orthopnea, PND, swelling in lower extremities, anasarca, dizziness, palpitations, syncope.   GI  No heartburn, indigestion, abdominal pain, nausea, vomiting, diarrhea, change in bowel habits, loss of appetite, bloody stools.   Resp:.  No chest wall deformity  Skin: no rash or lesions.  GU: no dysuria, change in color of urine, no urgency or frequency.  No flank pain, no hematuria   MS:  No joint pain or swelling.  No decreased range of motion.  No back pain.    Physical Exam  BP (!) 147/82 (BP Location: Left Arm, Patient Position: Sitting, Cuff Size: Large)   Pulse 83   Temp 98.2 F (36.8 C) (Oral)   Ht 4\' 9"  (1.448 m)   Wt 148 lb 9.6 oz (67.4 kg)   SpO2 95%   BMI 32.16 kg/m   GEN: A/Ox3; pleasant , NAD, On O2    HEENT:  /AT,  EACs-clear, TMs-wnl, NOSE-clear, THROAT-clear, no lesions, no postnasal drip or exudate noted.   NECK:   Supple w/ fair ROM; no JVD; normal carotid impulses w/o bruits; no thyromegaly or nodules palpated; no lymphadenopathy.    RESP  Clear  P & A; w/o, wheezes/ rales/ or rhonchi. no accessory muscle use, no dullness to percussion  CARD:  RRR, no m/r/g, no peripheral edema, pulses intact, no cyanosis or clubbing.  GI:   Soft & nt; nml bowel sounds; no organomegaly or masses detected.   Musco: Warm bil, no deformities or joint swelling noted.   Neuro: alert, no focal deficits noted.    Skin: Warm, no lesions or rashes    Lab Results:  CBC    BNP   Imaging: No results found.  Administration History     None          Latest Ref Rng & Units 10/03/2019    9:50 AM  PFT Results  FVC-Pre L 1.83  P  FVC-Predicted Pre % 68  P  FVC-Post L 1.83  P  FVC-Predicted Post % 68  P  Pre FEV1/FVC % % 51  P  Post FEV1/FCV % % 53  P  FEV1-Pre L 0.94  P  FEV1-Predicted Pre % 46  P  FEV1-Post L 0.97  P  DLCO uncorrected ml/min/mmHg 12.48  P  DLCO UNC% % 73  P  DLCO corrected ml/min/mmHg 12.48  P  DLCO COR %Predicted % 73  P  DLVA Predicted % 82  P  TLC L 6.05  P  TLC % Predicted % 138  P  RV % Predicted % 215  P    P Preliminary result    No results found for: "NITRICOXIDE"      Assessment & Plan:  Assessment and Plan    Chronic Obstructive Pulmonary Disease (COPD) Exacerbation  -slow to resolve.  Following a severe cold six weeks ago, her COPD symptoms have worsened showing partial improvement with azithromycin, prednisone, and doxycycline. She experiences significant dyspnea and impaired daily activities but reports no fever, productive cough, hemoptysis, or alarming symptoms. She is currently on [place], but due to insurance requirements, we will switch to Trelegy, one puff daily. We discussed  Ohtuvayre   We will order a chest x-ray, prescribe Ohtuvayre for the nebulizer, twice daily, and initiate a prednisone slow taper: 20 mg daily for one week, then 10 mg  daily for one week, then 5 mg daily for one week. She should continue the albuterol inhaler and DuoNeb as needed and follow up with a pulmonologist in 6-8 weeks. Repeat Spirometry with DLCO.   Chronic Hypoxic Respiratory Failure   She is on long-term oxygen therapy,  We discussed maintaining current oxygen levels and monitoring for changes. She will continue oxygen therapy at 3 L/min at rest and 4 L/min with activity, monitoring oxygen requirements and adjusting as needed.  General Health Maintenance   Discussed RSV vaccine this fall.   Follow-up   Follow up in 6-8 weeks with Spirometry with DLCO     Rubye Oaks, NP 02/21/2023

## 2023-02-22 LAB — CMP14+EGFR
ALT: 17 [IU]/L (ref 0–32)
AST: 22 [IU]/L (ref 0–40)
Albumin: 4.3 g/dL (ref 3.9–4.9)
Alkaline Phosphatase: 138 [IU]/L — ABNORMAL HIGH (ref 44–121)
BUN/Creatinine Ratio: 8 — ABNORMAL LOW (ref 12–28)
BUN: 6 mg/dL — ABNORMAL LOW (ref 8–27)
Bilirubin Total: <0.2 mg/dL (ref 0.0–1.2)
CO2: 26 mmol/L (ref 20–29)
Calcium: 9.7 mg/dL (ref 8.7–10.3)
Chloride: 95 mmol/L — ABNORMAL LOW (ref 96–106)
Creatinine, Ser: 0.77 mg/dL (ref 0.57–1.00)
Globulin, Total: 2.4 g/dL (ref 1.5–4.5)
Glucose: 123 mg/dL — ABNORMAL HIGH (ref 70–99)
Potassium: 3.5 mmol/L (ref 3.5–5.2)
Sodium: 139 mmol/L (ref 134–144)
Total Protein: 6.7 g/dL (ref 6.0–8.5)
eGFR: 84 mL/min/{1.73_m2} (ref >59)

## 2023-02-22 LAB — LIPID PANEL
Chol/HDL Ratio: 4.5 {ratio} — ABNORMAL HIGH (ref 0.0–4.4)
Cholesterol, Total: 310 mg/dL — ABNORMAL HIGH (ref 100–199)
HDL: 69 mg/dL (ref >39)
LDL Chol Calc (NIH): 199 mg/dL — ABNORMAL HIGH (ref 0–99)
Triglycerides: 222 mg/dL — ABNORMAL HIGH (ref 0–149)
VLDL Cholesterol Cal: 42 mg/dL — ABNORMAL HIGH (ref 5–40)

## 2023-02-22 LAB — VITAMIN D 25 HYDROXY (VIT D DEFICIENCY, FRACTURES): Vit D, 25-Hydroxy: 38.1 ng/mL (ref 30.0–100.0)

## 2023-02-28 NOTE — Progress Notes (Signed)
Called and spoke with patient, advised of results/recommendations per [name] Parrett NP.  She verbalized understanding.  Nothing further needed.

## 2023-03-01 ENCOUNTER — Telehealth: Payer: Self-pay | Admitting: Pharmacist

## 2023-03-01 NOTE — Telephone Encounter (Signed)
Received New start paperwork for [facility]. Missing sections of enrollment form completed and faxed in to [employer] with clinicals and insurance card copy   Phone#: [phone] Fax#: [phone]

## 2023-03-03 NOTE — Telephone Encounter (Signed)
Received fax from [place] [facility] that patient has been enrolled into pathway plus program. Sharyn Blitz rx has been triaged to [facility]  Patient ID: [id number] Phone: [phone] Fax: [phone]  Chesley Mires, PharmD, MPH, BCPS, CPP Clinical Pharmacist (Rheumatology and Pulmonology)

## 2023-03-09 ENCOUNTER — Ambulatory Visit: Payer: Medicare (Managed Care)

## 2023-03-10 ENCOUNTER — Other Ambulatory Visit: Payer: Self-pay | Admitting: Family Medicine

## 2023-03-10 DIAGNOSIS — G2581 Restless legs syndrome: Secondary | ICD-10-CM

## 2023-03-10 NOTE — Telephone Encounter (Signed)
Requested medications are due for refill today.  yes  Requested medications are on the active medications list.  yes  Last refill. 02/02/2023 #40 0 rf  Future visit scheduled.   yes  Notes to clinic.  Refill not delegated.    Requested Prescriptions  Pending Prescriptions Disp Refills   methocarbamol (ROBAXIN) 500 MG tablet [Pharmacy Med Name: METHOCARBAMOL 500 MG TABLET] 40 tablet 0    Sig: TAKE 1 TABLET BY MOUTH 2 TIMES DAILY AS NEEDED FOR MUSCLE SPASMS (NOT COVERED BY INS.)     Not Delegated - Analgesics:  Muscle Relaxants Failed - 03/10/2023  1:05 PM      Failed - This refill cannot be delegated      Passed - Valid encounter within last 6 months    Recent Outpatient Visits           2 weeks ago Primary hypertension   [place] [facility] [place]. [facility] Claiborne Rigg, NP   4 months ago Essential hypertension   [place] [facility] [place] - A Dept Of [place]. [facility] Claiborne Rigg, NP   8 months ago Primary hypertension   [place] [facility] [place] - A Dept Of [place]. [facility] Claiborne Rigg, NP   11 months ago Prediabetes   [place] [facility] [place] - A Dept Of [place]. [facility] Claiborne Rigg, NP   1 year ago Prediabetes    [facility] [place] - A Dept Of [place]. [facility], Marzella Schlein, [place]       Future Appointments             In 1 month Vassie Loll, Comer Locket, MD [facility] Health Pulmonary at [facility], [place]   In 1 month Cordelia Pen, Joan Mayans, NP [facility]rogynecology at [facility], [facility]   In 2 months Claiborne Rigg, NP [facility] - A Dept Of [place]. [facility]

## 2023-03-11 ENCOUNTER — Other Ambulatory Visit: Payer: Self-pay | Admitting: Adult Health

## 2023-03-11 ENCOUNTER — Other Ambulatory Visit: Payer: Self-pay | Admitting: Nurse Practitioner

## 2023-03-11 DIAGNOSIS — R197 Diarrhea, unspecified: Secondary | ICD-10-CM

## 2023-03-11 NOTE — Telephone Encounter (Signed)
Requested medication (s) are due for refill today - provider review   Requested medication (s) are on the active medication list -yes  Future visit scheduled -yes  Last refill: 02/21/23 #30   Notes to clinic: non delegated Rx  Requested Prescriptions  Pending Prescriptions Disp Refills   diphenoxylate-atropine (LOMOTIL) 2.5-0.025 MG tablet [Pharmacy Med Name: DIPHENOXYLATE-ATROP 2.5-0.025] 30 tablet 0    Sig: Take 1 tablet by mouth 4 (four) times daily as needed for diarrhea or loose stools.     Not Delegated - Gastroenterology:  Antidiarrheals Failed - 03/11/2023 11:38 AM      Failed - This refill cannot be delegated      Passed - Valid encounter within last 12 months    Recent Outpatient Visits           2 weeks ago Primary hypertension   [place] [facility] - [facility] Dept Of [place]. [facility] Claiborne Rigg, NP   4 months ago Essential hypertension   [place] [facility] [place] - A Dept Of [place]. [facility] Claiborne Rigg, NP   8 months ago Primary hypertension   [place] [facility] [place] - A Dept Of [place]. [facility] Claiborne Rigg, NP   11 months ago Prediabetes   [place] [facility] [place] - A Dept Of [place]. [facility] Claiborne Rigg, NP   1 year ago Prediabetes   [place] [facility] [place] - A Dept Of [place]. [facility] [place], Marzella Schlein, [place]       Future Appointments             In 1 month Vassie Loll, Comer Locket, MD [facility] Health Pulmonary at [facility], [place]   In 1 month Cordelia Pen, Joan Mayans, NP [facility] Health Urogynecology at [facility], [facility]   In 2 months Claiborne Rigg, NP [facility] Health [facility] - A Dept Of [place]. [facility]               Requested Prescriptions  Pending Prescriptions Disp Refills   diphenoxylate-atropine (LOMOTIL) 2.5-0.025 MG tablet [Pharmacy Med Name: DIPHENOXYLATE-ATROP 2.5-0.025] 30 tablet 0    Sig: Take  1 tablet by mouth 4 (four) times daily as needed for diarrhea or loose stools.     Not Delegated - Gastroenterology:  Antidiarrheals Failed - 03/11/2023 11:38 AM      Failed - This refill cannot be delegated      Passed - Valid encounter within last 12 months    Recent Outpatient Visits           2 weeks ago Primary hypertension   [place] [facility] - A Dept Of [place]. [facility] Claiborne Rigg, NP   4 months ago Essential hypertension   [place] [facility] [place] - A Dept Of [place]. [facility] Claiborne Rigg, NP   8 months ago Primary hypertension   [place] [facility] [place] - A Dept Of [place]. [facility] Claiborne Rigg, NP   11 months ago Prediabetes   [place] [facility] [place] - A Dept Of [place]. [facility] Claiborne Rigg, NP   1 year ago Prediabetes   [place] [facility] [place] - A Dept Of [place]. [facility], Marzella Schlein, [place]       Future Appointments  In 1 month Oretha Milch, MD [facility] Health Pulmonary at [facility], [place]   In 1 month Cordelia Pen, Joan Mayans, NP [facility] Health Urogynecology at [facility], [facility]   In 2 months Claiborne Rigg, NP [facility] - A Dept Of [place]. [facility]

## 2023-03-16 LAB — CDIFF NAA+O+P+STOOL CULTURE: E coli, Shiga toxin Assay: NEGATIVE

## 2023-03-18 ENCOUNTER — Ambulatory Visit: Payer: Medicare (Managed Care) | Admitting: Adult Health

## 2023-03-21 ENCOUNTER — Ambulatory Visit: Payer: Self-pay | Admitting: *Deleted

## 2023-03-21 NOTE — Telephone Encounter (Signed)
Results are not done yet. Patient will be called once results are in epic.

## 2023-03-21 NOTE — Telephone Encounter (Signed)
Reason for Disposition  [1] Caller requesting NON-URGENT health information AND [2] PCP's office is the best resource    Inquiring about stool sample result.   She brought a stool sample to the office.  Answer Assessment - Initial Assessment Questions 1. REASON FOR CALL or QUESTION: "What is your reason for calling today?" or "How can I best help you?" or "What question do you have that I can help answer?"     Pt brought a stool sample by and is calling in for the results.    I do not see the results posted yet.    I let her know we would call her when the results came in.   Message sent to Bertram Denver, NP for follow up.  Protocols used: Information Only Call - No Triage-A-AH

## 2023-03-21 NOTE — Telephone Encounter (Signed)
  Chief Complaint: Pt brought a stool sample by and is calling in inquiring about the results. Symptoms: diarrhea Frequency: on going Pertinent Negatives: Patient denies N/A Disposition: [] ED /[] Urgent Care (no appt availability in office) / [] Appointment(In office/virtual)/ []  [facility]/ [] Home Care/ [] Refused Recommended Disposition /[] [facility]/ [x]  Follow-up with PCP Additional Notes: Message sent to Bertram Denver, NP for follow up regarding stool sample result.

## 2023-03-24 ENCOUNTER — Telehealth: Payer: Self-pay | Admitting: Pulmonary Disease

## 2023-03-24 NOTE — Telephone Encounter (Signed)
Patient has a cough(green mucus is coming up) and needs an antibiotic.  [facility] on [address]

## 2023-03-25 ENCOUNTER — Other Ambulatory Visit: Payer: Self-pay | Admitting: Nurse Practitioner

## 2023-03-25 ENCOUNTER — Telehealth: Payer: Self-pay | Admitting: *Deleted

## 2023-03-25 ENCOUNTER — Other Ambulatory Visit: Payer: Self-pay

## 2023-03-25 DIAGNOSIS — R197 Diarrhea, unspecified: Secondary | ICD-10-CM

## 2023-03-25 DIAGNOSIS — R195 Other fecal abnormalities: Secondary | ICD-10-CM

## 2023-03-25 NOTE — Telephone Encounter (Signed)
Order placed

## 2023-03-25 NOTE — Telephone Encounter (Signed)
Patient identified by name and date of birth.   Patient aware of response and voiced understanding.   

## 2023-03-25 NOTE — Telephone Encounter (Signed)
fyi

## 2023-03-25 NOTE — Telephone Encounter (Signed)
Pt requesting lab results of stool sample from 12/12 24. No documentation noted from PCP at this time.  Pt verbalized understanding and requesting a call back when PCP has reviewed. Patient reports she is still having diarrhea. Please advise.

## 2023-03-25 NOTE — Telephone Encounter (Signed)
I have been out of the office with another provider covering. The ova parasite portion of the stool test was negative. Can we find out from the lab why the Cdiff was canceled please and follow up with patient? She has also been referred to GI for this. They will call her to schedule.  If she needs to repeat the Cdiff per lab we will have to have her come in for that to pick up kit.

## 2023-03-26 ENCOUNTER — Other Ambulatory Visit: Payer: Self-pay | Admitting: Pulmonary Disease

## 2023-03-26 DIAGNOSIS — J449 Chronic obstructive pulmonary disease, unspecified: Secondary | ICD-10-CM

## 2023-04-01 NOTE — Telephone Encounter (Signed)
 Spoke with patient today and she states that she Is much better. She will call if she needs anything else.

## 2023-04-02 ENCOUNTER — Other Ambulatory Visit (INDEPENDENT_AMBULATORY_CARE_PROVIDER_SITE_OTHER): Payer: Self-pay | Admitting: Primary Care

## 2023-04-02 DIAGNOSIS — M810 Age-related osteoporosis without current pathological fracture: Secondary | ICD-10-CM

## 2023-04-05 ENCOUNTER — Other Ambulatory Visit: Payer: Self-pay | Admitting: Nurse Practitioner

## 2023-04-05 DIAGNOSIS — R197 Diarrhea, unspecified: Secondary | ICD-10-CM

## 2023-04-05 NOTE — Telephone Encounter (Signed)
 Requested medication (s) are due for refill today - unsure  Requested medication (s) are on the active medication list -yes  Future visit scheduled -yes  Last refill: 03/13/23 #30  Notes to clinic: non delegated Rx- has notes  Requested Prescriptions  Pending Prescriptions Disp Refills   diphenoxylate -atropine  (LOMOTIL ) 2.5-0.025 MG tablet [Pharmacy Med Name: DIPHENOXYLATE -ATROP 2.5-0.025] 30 tablet 0    Sig: TAKE 1 TABLET BY MOUTH 4 TIMES DAILY AS NEEDED FOR DIARRHEA/LOOSE STOOLS. NO MORE REFILLS. SEE GI     Not Delegated - Gastroenterology:  Antidiarrheals Failed - 04/05/2023  9:40 AM      Failed - This refill cannot be delegated      Passed - Valid encounter within last 12 months    Recent Outpatient Visits           1 month ago Primary hypertension   [place] [facility] - A Dept Of [place]. [facility] Theotis Haze ORN, NP   5 months ago Essential hypertension   [place] [facility] [place] - A Dept Of [place]. [facility] Theotis Haze ORN, NP   8 months ago Primary hypertension   [place] [facility] - A Dept Of [place]. [facility] Theotis Haze ORN, NP   11 months ago Prediabetes   [place] [facility] [place] - A Dept Of [place]. [facility] Theotis Haze ORN, NP   1 year ago Prediabetes   [place] [facility] [place] - A Dept Of [place]. [facility] [place], Jon HERO, [place]       Future Appointments             In 3 weeks Jude Harden GAILS, MD [facility] Health Pulmonary at [facility], [place]   In 1 month Zuleta, Kaitlin G, NP [place] Urogynecology at [facility], [facility]   In 1 month Theotis Haze ORN, NP [facility] Health Comm [facility] [place] - A Dept Of [place]. [facility]               Requested Prescriptions  Pending Prescriptions Disp Refills   diphenoxylate -atropine  (LOMOTIL ) 2.5-0.025 MG tablet [Pharmacy Med Name: DIPHENOXYLATE -ATROP 2.5-0.025] 30 tablet 0     Sig: TAKE 1 TABLET BY MOUTH 4 TIMES DAILY AS NEEDED FOR DIARRHEA/LOOSE STOOLS. NO MORE REFILLS. SEE GI     Not Delegated - Gastroenterology:  Antidiarrheals Failed - 04/05/2023  9:40 AM      Failed - This refill cannot be delegated      Passed - Valid encounter within last 12 months    Recent Outpatient Visits           1 month ago Primary hypertension   [place] [facility] - A Dept Of [place]. [facility] Theotis Haze ORN, NP   5 months ago Essential hypertension   [place] [facility] [place] - A Dept Of [place]. [facility] Theotis Haze ORN, NP   8 months ago Primary hypertension   [place] [facility] [place] - A Dept Of [place]. [facility] Theotis Haze ORN, NP   11 months ago Prediabetes   [place] [facility] [place] - A Dept Of [place]. [facility] Theotis Haze ORN, NP   1 year ago Prediabetes   [place] [facility] [place] - A Dept Of [place]. [facility] Lueders, [place], PA-C  Future Appointments             In 3 weeks Jude Harden GAILS, MD [facility] Health Pulmonary at [facility], [place]   In 1 month Zuleta, Kaitlin G, NP [place] Urogynecology at [facility], [facility]   In 1 month Theotis Haze ORN, NP [facility] Health Comm Health [place] - A Dept Of [place]. [facility]

## 2023-04-20 ENCOUNTER — Other Ambulatory Visit: Payer: Self-pay | Admitting: Nurse Practitioner

## 2023-04-20 DIAGNOSIS — E876 Hypokalemia: Secondary | ICD-10-CM

## 2023-04-20 NOTE — Telephone Encounter (Signed)
Requested medication (s) are due for refill today: Yes  Requested medication (s) are on the active medication list: Yes  Last refill:  02/21/23  Future visit scheduled: Yes  Notes to clinic:  The sig says take for 5 days, unsure if refill is appropriate.      Requested Prescriptions  Pending Prescriptions Disp Refills   KLOR-CON M20 20 MEQ tablet [Pharmacy Med Name: KLOR-CON M20 TABLET] 90 tablet 1    Sig: TAKE 1 TABLET BY MOUTH EVERY DAY FOR 5 DAYS     Endocrinology:  Minerals - Potassium Supplementation Passed - 04/20/2023  3:48 PM      Passed - K in normal range and within 360 days    Potassium  Date Value Ref Range Status  02/21/2023 3.5 3.5 - 5.2 mmol/L Final         Passed - Cr in normal range and within 360 days    Creatinine, Ser  Date Value Ref Range Status  02/21/2023 0.77 0.57 - 1.00 mg/dL Final         Passed - Valid encounter within last 12 months    Recent Outpatient Visits           1 month ago Primary hypertension   [place] [facility] [place] - A Dept Of [place]. [facility] Claiborne Rigg, NP   6 months ago Essential hypertension   [place] [facility] [place] - A Dept Of [place]. [facility] Claiborne Rigg, NP   9 months ago Primary hypertension   [place] [facility] [place] - A Dept Of [place]. [facility] Claiborne Rigg, NP   1 year ago Prediabetes   [place] [facility] [place] - A Dept Of [place]. [facility] Claiborne Rigg, NP   1 year ago Prediabetes   [place] [facility] [place] - A Dept Of [place]. [facility] [place], Marzella Schlein, [place]       Future Appointments             In 1 week Oretha Milch, MD [facility] Health Pulmonary at [facility], [place]   In 2 weeks Cordelia Pen, Joan Mayans, NP [facility] Health Urogynecology at [facility], [facility]   In 1 month Claiborne Rigg, NP [facility] Health Comm [facility] - A Dept Of . [facility]

## 2023-04-27 DIAGNOSIS — J449 Chronic obstructive pulmonary disease, unspecified: Secondary | ICD-10-CM | POA: Diagnosis not present

## 2023-05-02 ENCOUNTER — Ambulatory Visit (HOSPITAL_BASED_OUTPATIENT_CLINIC_OR_DEPARTMENT_OTHER): Payer: 59 | Admitting: Pulmonary Disease

## 2023-05-02 ENCOUNTER — Other Ambulatory Visit (HOSPITAL_BASED_OUTPATIENT_CLINIC_OR_DEPARTMENT_OTHER): Payer: Self-pay

## 2023-05-02 ENCOUNTER — Encounter (HOSPITAL_BASED_OUTPATIENT_CLINIC_OR_DEPARTMENT_OTHER): Payer: Self-pay | Admitting: Pulmonary Disease

## 2023-05-02 VITALS — BP 108/68 | HR 101 | Resp 18 | Ht <= 58 in | Wt 165.4 lb

## 2023-05-02 DIAGNOSIS — J4489 Other specified chronic obstructive pulmonary disease: Secondary | ICD-10-CM

## 2023-05-02 DIAGNOSIS — R590 Localized enlarged lymph nodes: Secondary | ICD-10-CM | POA: Diagnosis not present

## 2023-05-02 DIAGNOSIS — J439 Emphysema, unspecified: Secondary | ICD-10-CM

## 2023-05-02 DIAGNOSIS — J9611 Chronic respiratory failure with hypoxia: Secondary | ICD-10-CM

## 2023-05-02 MED ORDER — AREXVY 120 MCG/0.5ML IM SUSR
0.5000 mL | Freq: Once | INTRAMUSCULAR | 0 refills | Status: AC
  Filled 2023-05-02: qty 0.5, 1d supply, fill #0

## 2023-05-02 NOTE — Patient Instructions (Signed)
X CT scan in February. Continue on Trelegy and ohtuwayre  3 L at rest and 4 L POC when walking if your POC battery will allow

## 2023-05-02 NOTE — Addendum Note (Signed)
Addended by: Jama Flavors on: 05/02/2023 09:23 AM   Modules accepted: Orders

## 2023-05-02 NOTE — Progress Notes (Signed)
Subjective:    Patient ID: Monica Scott, female    DOB: January 03, 1956, 68 y.o.   MRN: [id number]  HPI  67 yo ex-smoker  followed for COPD and chronic hypoxic respiratory failure on oxygen since July 2019 -on Oxygen 3l/m rest and 4l/m with activity      PMH-  mild obstructive sleep apnea declined CPAP Neurogenic bladder with self-catheterization   Meds -Trelegy not working well  01/2023 started Herschell Dimes, pred taper   Discussed the use of AI scribe software for clinical note transcription with the patient, who gave verbal consent to proceed.  History of Present Illness   The patient, with a history of COPD and chronic hypoxic respiratory failure, presents for a routine follow-up. She reports that her condition has worsened, stating, "I can't even do much anymore." She is on continuous oxygen therapy, which she reports is "a little borderline" and improves with rest. She has been using a new medication, a twice-daily nebulizer, which she reports is working well with no side effects. She also uses a Trelegy inhaler.  In December, she experienced a period of illness, which she attributes to exposure to her grandchildren's illnesses. She has received her vaccine but has not yet received the RSV vaccine, which she plans to get at the pharmacy.  The patient also reports issues with her portable oxygen concentrator, stating that it is not holding oxygen well and the battery is running low quickly. She has had this device for over two years and it is out of warranty. She is considering purchasing a new, lighter weight device out of pocket.  She also mentions a scheduled scan related to a lymph gland spotted on a previous scan. She was unsure of the date of the scan, but the doctor confirmed it is in a couple of weeks.       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    NPSG was done on February 13, 2019 that showed mild obstructive sleep apnea with AHI at 7.6 and SPO2 low at 73%.      Alpha-1 antitrypsin-MM, 139  Review of Systems neg for any significant sore throat, dysphagia, itching, sneezing, nasal congestion or excess/ purulent secretions, fever, chills, sweats, unintended wt loss, pleuritic or exertional cp, hempoptysis, orthopnea pnd or change in chronic leg swelling. Also denies presyncope, palpitations, heartburn, abdominal pain, nausea, vomiting, diarrhea or change in bowel or urinary habits, dysuria,hematuria, rash, arthralgias, visual complaints, headache, numbness weakness or ataxia.     Objective:   Physical Exam   Gen. Pleasant, obese, in no distress, elderly, on O2 [facility] - no lesions, no post nasal drip Neck: No JVD, no thyromegaly, no carotid bruits Lungs: no use of accessory muscles, no dullness to percussion, decreased without rales or rhonchi  Cardiovascular: Rhythm regular, heart sounds  normal, no murmurs or gallops, no peripheral edema Musculoskeletal: No deformities, no cyanosis or clubbing , no tremors      Assessment & Plan:    Assessment and Plan    Chronic Obstructive Pulmonary  Disease (COPD) Reports worsening symptoms and decreased activity tolerance. Oxygen saturation improved from 89% to 92% on 3L continuous oxygen. On Trelegy and a new twice-daily nebulizer medication, both effective without side effects. Issues with portable oxygen concentrator, out of warranty and not functioning optimally. Discussed need for a new concentrator and potential out-of-pocket costs. - Continue  Trelegy once daily - Continue new nebulizer medication twice daily - Discuss options for a new portable oxygen concentrator with family - Follow-up in six months  Chronic Hypoxic Respiratory Failure Secondary to COPD. Managed with 3L continuous oxygen. Issues with portable oxygen concentrator, not holding oxygen well and out of warranty. Discussed need for a new concentrator and potential out-of-pocket costs. - Continue 3L continuous oxygen therapy @ rest & 4L POC on walking - Discuss options for a new portable oxygen concentrator with family  Subcarinal Lymphadenopathy Previous scan showed a lymph gland six months ago. Follow-up scan with dye scheduled this month to better evaluate the lymph gland. Explained need for dye scan to better visualize lymph glands. - Complete scheduled CT chest w con - Review scan results and determine need for further follow-up or annual scans  General Health Maintenance Received flu vaccine but not RSV vaccine. Discussed importance of RSV vaccine, especially given respiratory conditions. Informed about availability at the pharmacy downstairs. - Obtain RSV vaccine from the pharmacy downstairs  Follow-up - Follow-up in six months.

## 2023-05-03 ENCOUNTER — Other Ambulatory Visit: Payer: Self-pay | Admitting: Nurse Practitioner

## 2023-05-03 DIAGNOSIS — I10 Essential (primary) hypertension: Secondary | ICD-10-CM

## 2023-05-05 ENCOUNTER — Encounter: Payer: Self-pay | Admitting: Obstetrics and Gynecology

## 2023-05-05 ENCOUNTER — Ambulatory Visit (INDEPENDENT_AMBULATORY_CARE_PROVIDER_SITE_OTHER): Payer: 59 | Admitting: Obstetrics and Gynecology

## 2023-05-05 VITALS — BP 111/76 | HR 79

## 2023-05-05 DIAGNOSIS — N8110 Cystocele, unspecified: Secondary | ICD-10-CM

## 2023-05-05 DIAGNOSIS — N813 Complete uterovaginal prolapse: Secondary | ICD-10-CM | POA: Diagnosis not present

## 2023-05-05 DIAGNOSIS — N952 Postmenopausal atrophic vaginitis: Secondary | ICD-10-CM

## 2023-05-05 DIAGNOSIS — N993 Prolapse of vaginal vault after hysterectomy: Secondary | ICD-10-CM

## 2023-05-05 NOTE — Progress Notes (Signed)
 [facility]   Subjective:     Chief Complaint:  Chief Complaint  Patient presents with   Pessary Check    Monica Scott is a 69 y.o. female is here for pessary check   History of Present Illness: Monica Scott is a 68 y.o. female with stage III pelvic organ prolapse who presents for a pessary check. She is using a size #3 ring with support pessary. The pessary has been working well and she has no complaints. She is using vaginal estrogen cream but not consistently. She endorses some minimal vaginal bleeding.  Past Medical History: Patient  has a past medical history of Arthritis, Asthma, Bladder disease, COPD (chronic obstructive pulmonary disease) (HCC), Depression, GERD (gastroesophageal reflux disease), Hyperlipidemia, Hypertension, Peptic ulcer, and Urinary retention.   Past Surgical History: She  has a past surgical history that includes Vaginal hysterectomy; Pilonidal cyst excision; Colonoscopy with propofol  (N/A, 04/24/2018); Esophagogastroduodenoscopy (egd) with propofol  (N/A, 04/24/2018); maloney dilation (N/A, 04/24/2018); biopsy (04/24/2018); polypectomy (04/24/2018); Colonoscopy with propofol  (N/A, 09/21/2021); and polypectomy (09/21/2021).   Medications: She has a current medication list which includes the following prescription(s): acetaminophen , albuterol , amlodipine , aspirin  ec, bupropion , diphenoxylate -atropine , ohtuvayre , estradiol , estradiol , fluoxetine , trelegy ellipta , gabapentin , ibuprofen , ipratropium-albuterol , methocarbamol , olanzapine , olmesartan , omeprazole , potassium chloride  sa, prednisone , and rosuvastatin .   Allergies: Patient is allergic to tizanidine .   Social History: Patient  reports that she quit smoking about 13 years ago. Her smoking use included cigarettes. She started smoking about 53 years ago. She has a 60 pack-year smoking history. She has never been exposed to tobacco smoke. She has never used smokeless tobacco. She reports  that she does not drink alcohol and does not use drugs.      Objective:    Physical Exam: BP 111/76   Pulse 79  Gen: No apparent distress, A&O x 3. Detailed Urogynecologic Evaluation:  Pelvic Exam: Normal external female genitalia; Bartholin's and Skene's glands normal in appearance; urethral meatus normal in appearance, no urethral masses or discharge. The pessary was noted to be in place. It was removed and cleaned. Speculum exam revealed healed mild bleeding at the apex on the left side, no silver nitrate needed. The pessary was replaced. It was comfortable to the patient and fit well.    Assessment/Plan:    Assessment: Monica Scott is a 68 y.o. with stage III pelvic organ prolapse here for a pessary check. She is doing well.  Plan: She will keep the pessary in place until next visit. She will continue to use estrogen, encouraged her to use this consistently. She will follow-up in 3 months for a pessary check or sooner as needed.   All questions were answered.

## 2023-05-11 ENCOUNTER — Ambulatory Visit
Admission: RE | Admit: 2023-05-11 | Discharge: 2023-05-11 | Discharge status: Home / Self Care | Payer: 59 | Source: Ambulatory Visit | Attending: Pulmonary Disease | Admitting: Pulmonary Disease

## 2023-05-11 DIAGNOSIS — R590 Localized enlarged lymph nodes: Secondary | ICD-10-CM | POA: Diagnosis not present

## 2023-05-11 DIAGNOSIS — I700 Atherosclerosis of aorta: Secondary | ICD-10-CM | POA: Diagnosis not present

## 2023-05-11 DIAGNOSIS — J439 Emphysema, unspecified: Secondary | ICD-10-CM | POA: Diagnosis not present

## 2023-05-11 MED ORDER — IOPAMIDOL (ISOVUE-300) INJECTION 61%
100.0000 mL | Freq: Once | INTRAVENOUS | Status: AC | PRN
  Administered 2023-05-11: 75 mL via INTRAVENOUS

## 2023-05-13 ENCOUNTER — Other Ambulatory Visit: Payer: Self-pay | Admitting: Family Medicine

## 2023-05-13 DIAGNOSIS — G2581 Restless legs syndrome: Secondary | ICD-10-CM

## 2023-05-13 NOTE — Telephone Encounter (Signed)
Requested medication (s) are due for refill today: yes   Requested medication (s) are on the active medication list: yes   Last refill:  03/10/23 #40 0 refills   Future visit scheduled: yes in 2 months   Notes to clinic:  not delegated per protocol do you want to refill Rx?     Requested Prescriptions  Pending Prescriptions Disp Refills   methocarbamol (ROBAXIN) 500 MG tablet [Pharmacy Med Name: METHOCARBAMOL 500 MG TABLET] 40 tablet 0    Sig: TAKE 1 TABLET BY MOUTH 2 TIMES DAILY AS NEEDED FOR MUSCLE SPASMS (NOT COVERED BY INS.)     Not Delegated - Analgesics:  Muscle Relaxants Failed - 05/13/2023  4:28 PM      Failed - This refill cannot be delegated      Passed - Valid encounter within last 6 months    Recent Outpatient Visits           2 months ago Primary hypertension   [place] [facility] [place]. [facility] Claiborne Rigg, NP   7 months ago Essential hypertension   [place] [facility] [place] - [facility] Dept Of [place]. [facility] Claiborne Rigg, NP   10 months ago Primary hypertension   [place] [facility] [place] - [facility] Of [place]. [facility] Claiborne Rigg, NP   1 year ago Prediabetes   [place] [facility] [place] [facility] [place]. [facility] Rigg, NP   1 year ago Prediabetes   [place] [facility] [place] - [facility] Dept Of [place]. [facility], Marzella Schlein, [place]       Future Appointments             In 2 weeks Claiborne Rigg, NP [facility] [place] - A Dept Of [place]. [facility]   In 2 months Zuleta, Joan Mayans, NP [facility] at [facility], [facility]

## 2023-05-19 ENCOUNTER — Other Ambulatory Visit: Payer: Self-pay | Admitting: Pulmonary Disease

## 2023-05-19 DIAGNOSIS — J449 Chronic obstructive pulmonary disease, unspecified: Secondary | ICD-10-CM

## 2023-05-27 DIAGNOSIS — J449 Chronic obstructive pulmonary disease, unspecified: Secondary | ICD-10-CM | POA: Diagnosis not present

## 2023-06-01 ENCOUNTER — Ambulatory Visit: Payer: 59 | Admitting: Nurse Practitioner

## 2023-06-15 ENCOUNTER — Telehealth: Payer: Self-pay | Admitting: *Deleted

## 2023-06-15 NOTE — Telephone Encounter (Signed)
 Needs office visit. She can also see Marylene Land

## 2023-06-15 NOTE — Telephone Encounter (Signed)
 Copied from CRM [id number]. Topic: Clinical - Home Health Verbal Orders >> Jun 15, 2023 12:37 PM Geroge Baseman wrote: Caller/Agency: [facility] House Calls/Shiela Callback Number: [phone] Service Requested: N/A Frequency: N/A Any new concerns about the patient? Yes, Performed an A1C during a house call and the results were 10.2, very high.

## 2023-06-16 ENCOUNTER — Telehealth: Payer: Self-pay | Admitting: Pulmonary Disease

## 2023-06-16 DIAGNOSIS — J9611 Chronic respiratory failure with hypoxia: Secondary | ICD-10-CM

## 2023-06-16 NOTE — Telephone Encounter (Signed)
PT ret call. Please try again. TY.

## 2023-06-16 NOTE — Telephone Encounter (Signed)
 Pt calling asking for a POC. Already has large tanks.  Her # is [phone]

## 2023-06-16 NOTE — Telephone Encounter (Signed)
 Looks like order was placed on 05/02/2023 for POC to [employer] can we check on this for patient?

## 2023-06-16 NOTE — Telephone Encounter (Signed)
 Pt notified order had been placed and message sent to [employer] regarding this

## 2023-06-17 NOTE — Telephone Encounter (Signed)
 Rowland Lathe; Darcel Smalling Can we have the provider use the O2 template?

## 2023-06-17 NOTE — Telephone Encounter (Signed)
 Order has been corrected.  Nothing further needed.

## 2023-06-21 DIAGNOSIS — J9611 Chronic respiratory failure with hypoxia: Secondary | ICD-10-CM | POA: Diagnosis not present

## 2023-06-22 ENCOUNTER — Other Ambulatory Visit: Payer: Self-pay | Admitting: Family Medicine

## 2023-06-22 DIAGNOSIS — G2581 Restless legs syndrome: Secondary | ICD-10-CM

## 2023-06-22 NOTE — Telephone Encounter (Signed)
 Requested medication (s) are due for refill today -yes  Requested medication (s) are on the active medication list -yes  Future visit scheduled -no  Last refill: 05/16/23 #40  Notes to clinic: non delegated Rx  Requested Prescriptions  Pending Prescriptions Disp Refills   methocarbamol (ROBAXIN) 500 MG tablet [Pharmacy Med Name: METHOCARBAMOL 500 MG TABLET] 40 tablet 0    Sig: TAKE 1 TABLET BY MOUTH 2 TIMES DAILY AS NEEDED FOR MUSCLE SPASMS (NOT COVERED BY INS.)     Not Delegated - Analgesics:  Muscle Relaxants Failed - 06/22/2023  3:53 PM      Failed - This refill cannot be delegated      Passed - Valid encounter within last 6 months    Recent Outpatient Visits           4 months ago Primary hypertension   [place] [facility] Of [place]. [facility] Claiborne Rigg, NP   8 months ago Essential hypertension   [place] [facility] [place] - [facility] Dept Of [place]. [facility] Claiborne Rigg, NP   11 months ago Primary hypertension   [place] [facility] [place] - [facility] Dept Of [place]. [facility] Claiborne Rigg, NP   1 year ago Prediabetes   [place] [facility] [place] - [facility] Dept Of [place]. [facility] Claiborne Rigg, NP   1 year ago Prediabetes   [place] [facility] [place] - [facility] Dept Of [place]. [facility], Marzella Schlein, [place]       Future Appointments             In 2 days Dan Humphreys, Storm Frisk, NP [facility]   In 1 month Cordelia Pen, Joan Mayans, NP [facility] at [facility], [facility]               Requested Prescriptions  Pending Prescriptions Disp Refills   methocarbamol (ROBAXIN) 500 MG tablet [Pharmacy Med Name: METHOCARBAMOL 500 MG TABLET] 40 tablet 0    Sig: TAKE 1 TABLET BY MOUTH 2 TIMES DAILY AS NEEDED FOR MUSCLE SPASMS (NOT COVERED BY INS.)     Not Delegated - Analgesics:  Muscle Relaxants Failed - 06/22/2023   3:53 PM      Failed - This refill cannot be delegated      Passed - Valid encounter within last 6 months    Recent Outpatient Visits           4 months ago Primary hypertension   [place] [facility] [place] [facility] Dept Of [place]. [facility] Claiborne Rigg, NP   8 months ago Essential hypertension   [place] [facility] [place] - [facility] Dept Of [place]. [facility] Claiborne Rigg, NP   11 months ago Primary hypertension   [place] [facility] [place] - A Dept Of [place]. [facility] Claiborne Rigg, NP   1 year ago Prediabetes   [place] [facility] [place] - A Dept Of [place]. [facility] Claiborne Rigg, NP   1 year ago Prediabetes   [place] [facility] [place] - A Dept Of [place]. [facility] [place], Marzella Schlein, [place]       Future Appointments             In 2 days Alver Sorrow,  NP [place] [facility] at [facility]   In 1 month Zuleta, Joan Mayans, NP [facility] at [facility], [facility]

## 2023-06-24 ENCOUNTER — Ambulatory Visit (HOSPITAL_BASED_OUTPATIENT_CLINIC_OR_DEPARTMENT_OTHER): Admitting: Family

## 2023-06-24 ENCOUNTER — Encounter (HOSPITAL_BASED_OUTPATIENT_CLINIC_OR_DEPARTMENT_OTHER): Payer: Self-pay | Admitting: Family

## 2023-06-24 VITALS — BP 118/70 | HR 96 | Ht <= 58 in | Wt 162.3 lb

## 2023-06-24 DIAGNOSIS — I10 Essential (primary) hypertension: Secondary | ICD-10-CM

## 2023-06-24 DIAGNOSIS — I6523 Occlusion and stenosis of bilateral carotid arteries: Secondary | ICD-10-CM | POA: Diagnosis not present

## 2023-06-24 DIAGNOSIS — E782 Mixed hyperlipidemia: Secondary | ICD-10-CM | POA: Diagnosis not present

## 2023-06-24 DIAGNOSIS — I700 Atherosclerosis of aorta: Secondary | ICD-10-CM

## 2023-06-24 NOTE — Patient Instructions (Addendum)
 Medication Instructions:  Continue your current medications.   *If you need a refill on your cardiac medications before your next appointment, please call your pharmacy*  Lab Work: Plan for cholesterol lab work with your new primary care in a couple months  Testing/Procedures: Your EKG today looked great!  Follow-Up: At [facility], you and your health needs are our priority.  As part of our continuing mission to provide you with exceptional heart care, our providers are all part of one team.  This team includes your primary Cardiologist (physician) and Advanced Practice Providers or APPs (Physician Assistants and Nurse Practitioners) who all work together to provide you with the care you need, when you need it.  Your next appointment:   6 months with Dr. Cristal Deer, Gillian Shields, NP   We recommend signing up for the patient portal called "MyChart".  Sign up information is provided on this After Visit Summary.  MyChart is used to connect with patients for Virtual Visits (Telemedicine).  Patients are able to view lab/test results, encounter notes, upcoming appointments, etc.  Non-urgent messages can be sent to your provider as well.   To learn more about what you can do with MyChart, go to [email or web address].   Other Instructions  Heart Healthy Diet Recommendations: A low-salt diet is recommended. Meats should be grilled, baked, or boiled. Avoid fried foods. Focus on lean protein sources like fish or chicken with vegetables and fruits. The American Heart Association is a Chief Technology Officer!  American Heart Association Diet and Lifeystyle Recommendations   Exercise recommendations: The American Heart Association recommends 150 minutes of moderate intensity exercise weekly. Try 30 minutes of moderate intensity exercise 4-5 times per week. This could include walking, jogging, or swimming.

## 2023-06-24 NOTE — Progress Notes (Signed)
 Cardiology Office Note:  .   Date:  06/24/2023  ID:  Monica Scott, DOB [birth year], MRN [id number] PCP: Claiborne Rigg, NP  [facility] Providers Cardiologist:  Jodelle Red, MD    History of Present Illness: Monica Scott is a 68 y.o. female with hx of carotid stenosis, HTN, HLD, prediabetes, hypokalemia, GERD, COPD, asthma, arthritis, peptic ulcer, aortic atherosclerosis, coronary calcification on CT.   Prior CTA head/neck 01/22/2022 was positive for bulky atherosclerosis at the left CCA and ICA origins with 65 to 68% stenosis at both.  Noted near complete retropharyngeal course of both carotids.  Established with Dr. Cristal Deer 02/22/2022.  Her BP was controlled on amlodipine, olmesartan and these were continued.  Medical therapy for carotid stenosis including statin, aspirin were recommended. As LDL 160, Atorvastatin changed to Rosuvastatin 40mg  daily.   Lipid panel 02/21/23 total cholesterol 210, triglycerides 222, HDL 69, LDL 199.   Presents today for follow up. She is currently trying to eat better and is working on getting back into walking. She takes her blood pressure at home with SBP less than 120. She is currently on 4L of oxygen. She had lost Crestor and is now back on it as of a couple days ago. Reports no shortness of breath nor dyspnea on exertion. Reports no chest pain, pressure, syncope, vision changes, or tightness. No edema, orthopnea, PND. Reports no palpitations.   ROS: Please see the history of present illness.    All other systems reviewed and are negative.  Studies Reviewed: Marland Kitchen   EKG Interpretation Date/Time:  Friday June 24 2023 15:15:13 EDT Ventricular Rate:  91 PR Interval:  138 QRS Duration:  78 QT Interval:  350 QTC Calculation: 430 R Axis:   73  Text Interpretation: Normal sinus rhythm Normal ECG Confirmed by Gillian Shields ([id number]) on 06/24/2023 3:20:51 PM    Cardiac Studies & Procedures    ______________________________________________________________________________________________     ECHOCARDIOGRAM  ECHOCARDIOGRAM COMPLETE 02/13/2019  Narrative ECHOCARDIOGRAM REPORT    Patient Name:   Monica Scott Date of Exam: 02/13/2019 Medical Rec #:  [id number]        Height:       59.0 in Accession #:    [id number]       Weight:       169.2 lb Date of Birth:  10/20/1955         BSA:          1.72 m Patient Age:    63 years         BP:           116/64 mmHg Patient Gender: F                HR:           90 bpm. Exam Location:  [address]  Procedure: 2D Echo, 3D Echo, Cardiac Doppler and Color Doppler  Indications:    R06.00 Dyspnea  History:        Patient has no prior history of Echocardiogram examinations. COPD, Signs/Symptoms:Dyspnea; Risk Factors:Family History of Coronary Artery Disease, Hypertension, Dyslipidemia and Former Smoker. Asthma, History of ETOH Abuse, Obesity.  Sonographer:    Farrel Conners RDCS Referring Phys: [phone] [name] S PARRETT  IMPRESSIONS   1. Left ventricular ejection fraction, by visual estimation, is 60 to 65%. The left ventricle has normal function. There is no left ventricular hypertrophy. 2. Global right ventricle has normal systolic function.The right ventricular size is normal. No  increase in right ventricular wall thickness. 3. Left atrial size was normal. 4. Right atrial size was normal. 5. The mitral valve is normal in structure. No evidence of mitral valve regurgitation. 6. The tricuspid valve is normal in structure. Tricuspid valve regurgitation is not demonstrated. 7. The aortic valve is normal in structure. Aortic valve regurgitation is not visualized. No evidence of aortic valve sclerosis or stenosis. 8. The pulmonic valve was not well visualized. Pulmonic valve regurgitation is not visualized. 9. Presence of pericardial fat pad. 10. There is mild dilatation of the ascending aorta measuring 37 mm. 11. The inferior  vena cava is normal in size with greater than 50% respiratory variability, suggesting right atrial pressure of 3 mmHg. 12. TR signal is inadequate for assessing pulmonary artery systolic pressure.  FINDINGS Left Ventricle: Left ventricular ejection fraction, by visual estimation, is 60 to 65%. The left ventricle has normal function. The left ventricular internal cavity size was the left ventricle is normal in size. There is no left ventricular hypertrophy. Left ventricular diastolic parameters were normal.  Right Ventricle: The right ventricular size is normal. No increase in right ventricular wall thickness. Global RV systolic function is has normal systolic function.  Left Atrium: Left atrial size was normal in size.  Right Atrium: Right atrial size was normal in size  Pericardium: Trivial pericardial effusion is present. Presence of pericardial fat pad.  Mitral Valve: The mitral valve is normal in structure. No evidence of mitral valve regurgitation.  Tricuspid Valve: The tricuspid valve is normal in structure. Tricuspid valve regurgitation is not demonstrated.  Aortic Valve: The aortic valve is normal in structure. Aortic valve regurgitation is not visualized. The aortic valve is structurally normal, with no evidence of sclerosis or stenosis.  Pulmonic Valve: The pulmonic valve was not well visualized. Pulmonic valve regurgitation is not visualized.  Aorta: Aortic dilatation noted. There is mild dilatation of the ascending aorta measuring 37 mm.  Venous: The inferior vena cava is normal in size with greater than 50% respiratory variability, suggesting right atrial pressure of 3 mmHg.  IAS/Shunts: The interatrial septum was not well visualized.    LEFT VENTRICLE PLAX 2D LVIDd:         4.04 cm  Diastology LVIDs:         2.45 cm  LV e' lateral:   9.14 cm/s LV PW:         0.96 cm  LV E/e' lateral: 9.2 LV IVS:        0.78 cm  LV e' medial:    6.20 cm/s LVOT diam:     2.10 cm  LV  E/e' medial:  13.5 LV SV:         51 ml LV SV Index:   27.70 LVOT Area:     3.46 cm   RIGHT VENTRICLE RV S prime:     17.50 cm/s TAPSE (M-mode): 2.1 cm  LEFT ATRIUM             Index       RIGHT ATRIUM          Index LA diam:        3.70 cm 2.15 cm/m  RA Area:     9.27 cm LA Vol (A2C):   36.2 ml 21.07 ml/m RA Volume:   17.60 ml 10.25 ml/m LA Vol (A4C):   33.9 ml 19.73 ml/m LA Biplane Vol: 35.0 ml 20.37 ml/m AORTIC VALVE LVOT Vmax:   150.00 cm/s LVOT Vmean:  90.700 cm/s LVOT  VTI:    0.258 m  AORTA Ao Root diam: 3.80 cm Ao Asc diam:  3.70 cm  MITRAL VALVE MV Area (PHT): cm                   SHUNTS MV PHT:        msec                  Systemic VTI:  0.26 m MV Decel Time: 232 msec              Systemic Diam: 2.10 cm MV E velocity: 83.90 cm/s  103 cm/s MV A velocity: 104.00 cm/s 70.3 cm/s MV E/A ratio:  0.81        1.5   Epifanio Lesches MD Electronically signed by Epifanio Lesches MD Signature Date/Time: 02/13/2019/10:12:23 PM    Final          ______________________________________________________________________________________________      Risk Assessment/Calculations:             Physical Exam:   VS:  BP 118/70   Pulse 96   Ht 4\' 9"  (1.448 m)   Wt 162 lb 4.8 oz (73.6 kg)   SpO2 95% Comment: 4 liters o2  BMI 35.12 kg/m    Wt Readings from Last 3 Encounters:  06/24/23 162 lb 4.8 oz (73.6 kg)  05/02/23 165 lb 6.4 oz (75 kg)  02/21/23 149 lb (67.6 kg)    GEN: Well nourished, well developed in no acute distress NECK: No JVD; No carotid bruits CARDIAC: RRR, no murmurs, rubs, gallops RESPIRATORY:  Clear to auscultation without rales, wheezing or rhonchi  ABDOMEN: Soft, non-tender, non-distended EXTREMITIES:  No edema; No deformity   ASSESSMENT AND PLAN: .    Aortic atherosclerosis / Coronary calcification on CT / HLD / Bilateral carotid stenosis - EKG today unremarkable. Stable with no anginal symptoms. No indication for ischemic  evaluation. Denies syncope and vision changes. Discussed symptoms to notify us of for repeat carotid imaging. GDMT: Aspirin 81 mg, and rosuvastatin 40 mg. Seeing new PCP in May and she will recheck lipid panel then as recently resumed Crestor. If LDL not at goal <70 at that time, consider Repatha vs Leqvio. Patient information provided.   HTN - BP well controlled. Continue current antihypertensive regimen of amlodipine 10 mg, and olmesartan 40 mg.  Discussed to monitor BP at home at least 2 hours after medications and sitting for 5-10 minutes.   BMI 35 - Weight loss via diet and exercise encouraged. Discussed the impact being overweight would have on cardiovascular risk. Motivated to walk more. Given information on AHOY classes at [facility].       Dispo: Follow up in 6 months for lipid management  Signed, Alver Sorrow, NP

## 2023-06-25 DIAGNOSIS — J449 Chronic obstructive pulmonary disease, unspecified: Secondary | ICD-10-CM | POA: Diagnosis not present

## 2023-06-29 ENCOUNTER — Ambulatory Visit: Admitting: Physician Assistant

## 2023-06-29 IMAGING — DX DG CHEST 2V
2 series · 2 of 2 positions shown · non-contrast
Comparison: Chest CT 03/26/2020 and earlier.

CLINICAL DATA: 65-year-old female with fever for 3 days.

EXAM:
CHEST - 2 VIEW

[chest lat]
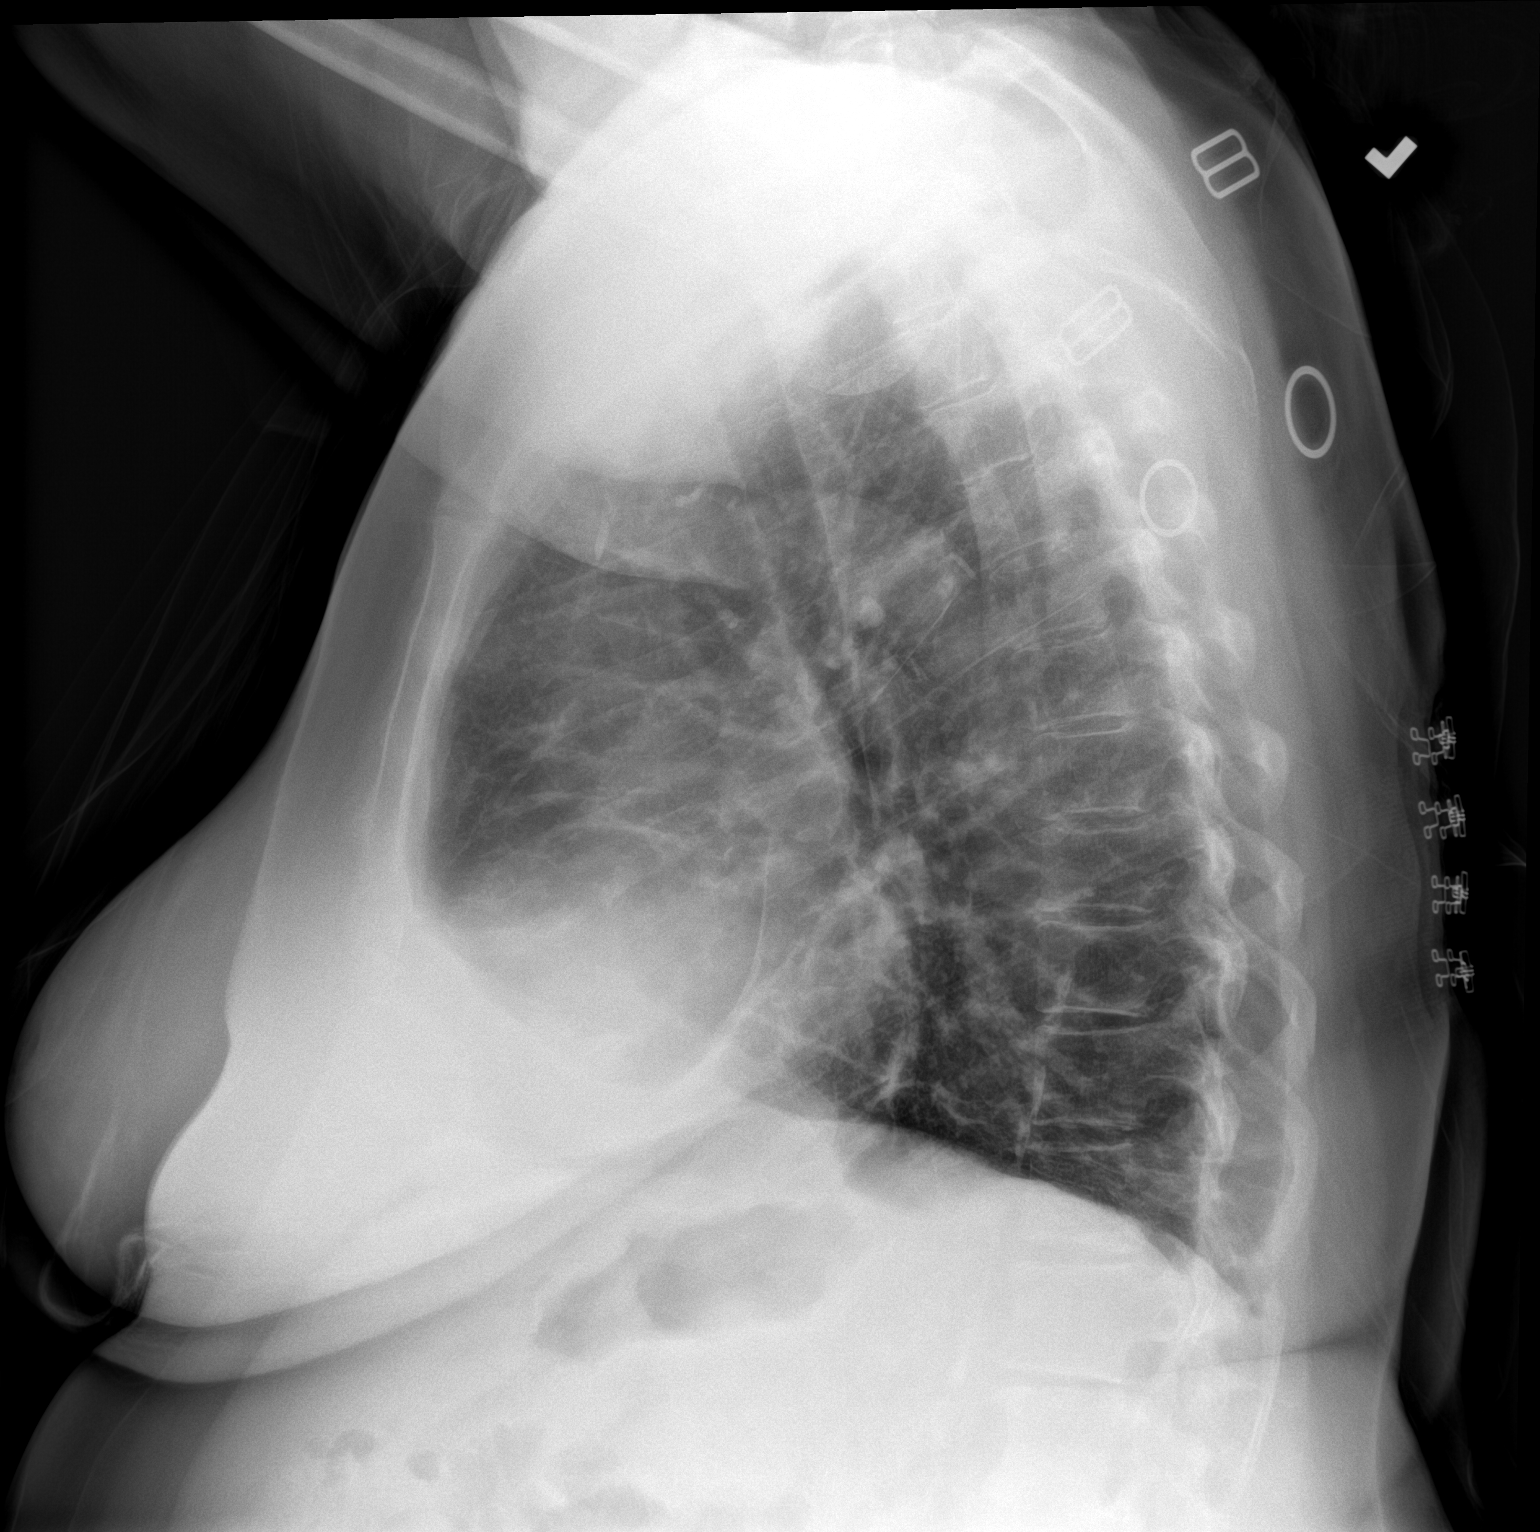

[chest ap]
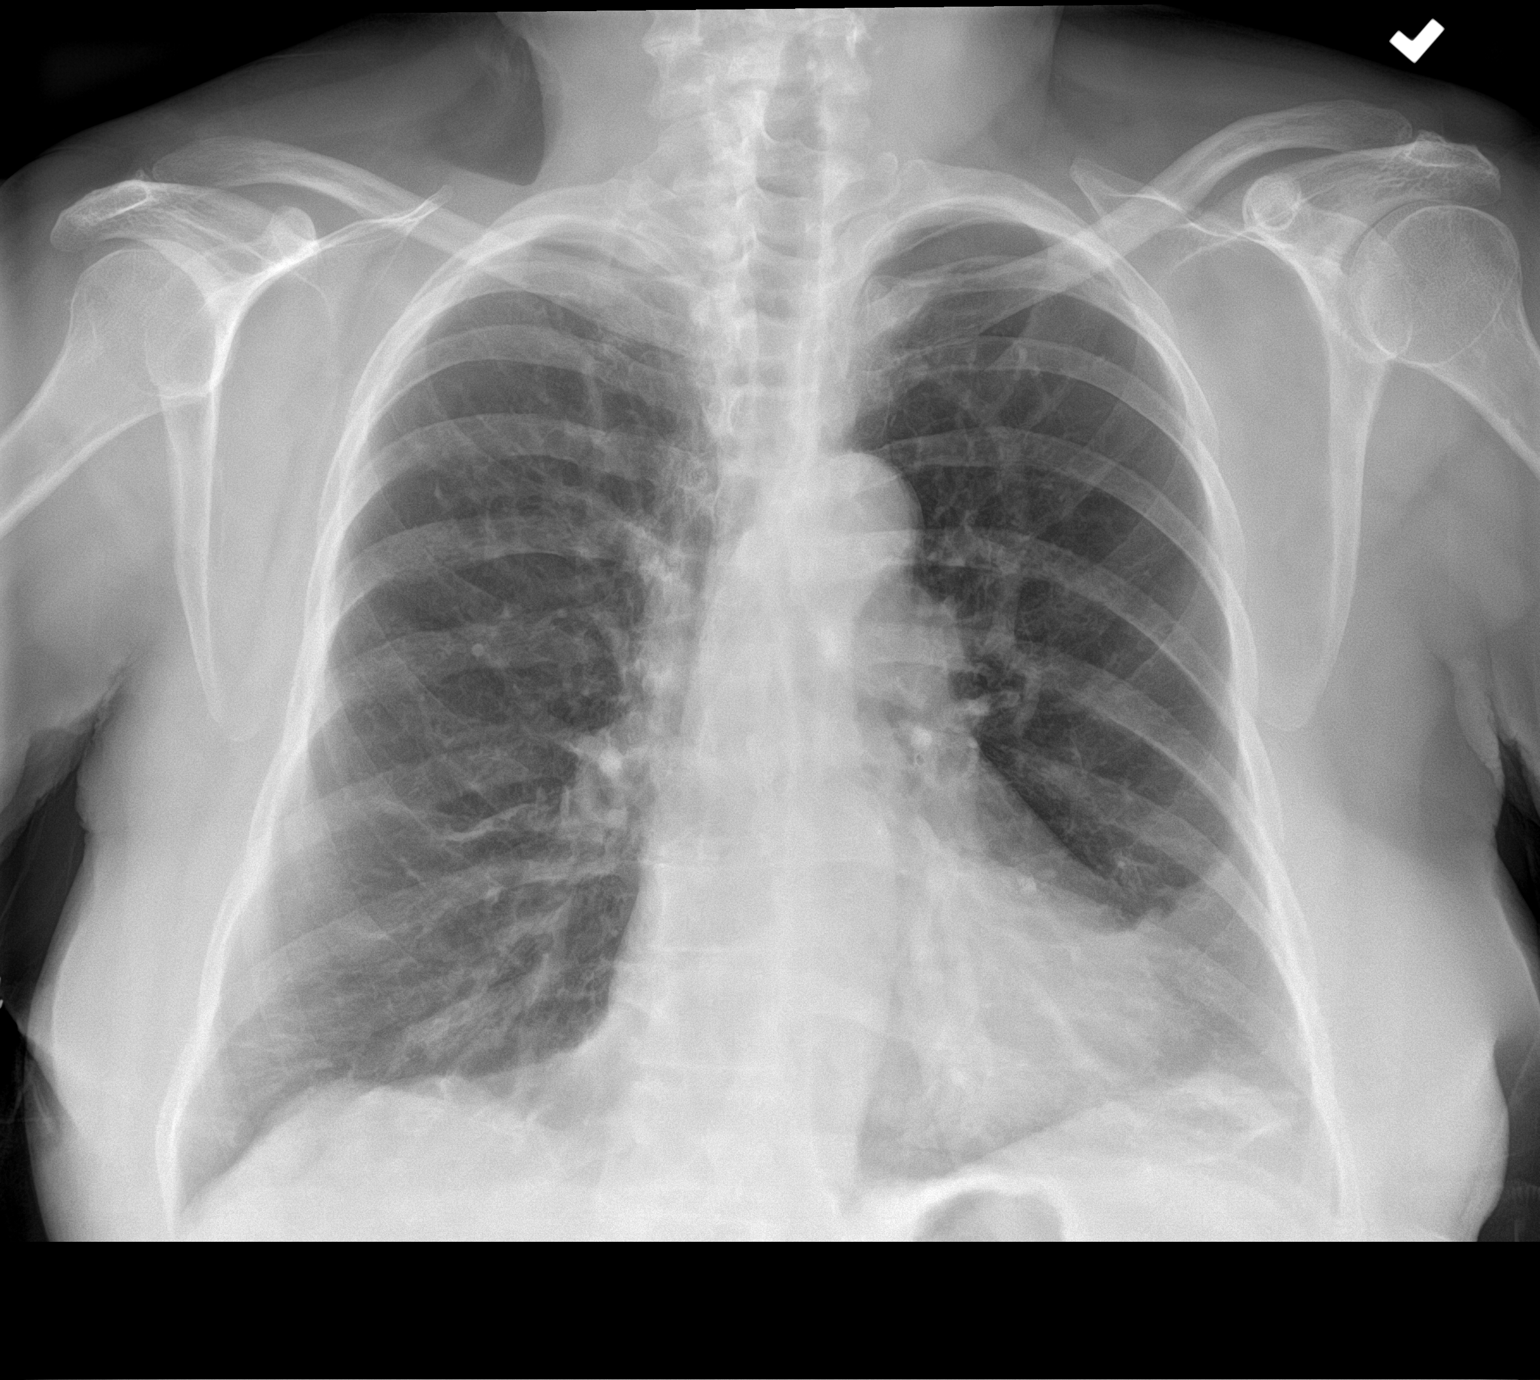

[2 of 2 positions shown; findings below may reference images not displayed]

FINDINGS: Stable lung volumes and mediastinal contours. Chronic lipomatosis
and/or architectural distortion in the lingula. No convincing acute
pulmonary opacity. No pneumothorax or pleural effusion. Emphysema
demonstrated by CT last year. Calcified aortic atherosclerosis. No
acute osseous abnormality identified. Negative visible bowel gas
pattern.
IMPRESSION: Emphysema (H87L0-JCP.T) and Aortic Atherosclerosis (H87L0-OU9.9). No
acute cardiopulmonary abnormality.

## 2023-06-30 ENCOUNTER — Other Ambulatory Visit (HOSPITAL_COMMUNITY)
Admission: RE | Admit: 2023-06-30 | Discharge: 2023-06-30 | Disposition: A | Discharge status: Home / Self Care | Source: Ambulatory Visit | Attending: Obstetrics and Gynecology | Admitting: Obstetrics and Gynecology

## 2023-06-30 ENCOUNTER — Ambulatory Visit (INDEPENDENT_AMBULATORY_CARE_PROVIDER_SITE_OTHER)

## 2023-06-30 VITALS — BP 108/73 | HR 84

## 2023-06-30 DIAGNOSIS — N898 Other specified noninflammatory disorders of vagina: Secondary | ICD-10-CM | POA: Insufficient documentation

## 2023-06-30 NOTE — Patient Instructions (Signed)
 Please keep any scheduled follow ups.  It was a pleasure to see you today!  Thank you for trusting me with your care!

## 2023-06-30 NOTE — Progress Notes (Signed)
 Patient came to the office today with complaints of vaginal discharge, odor and some mild itching and burning. She has been using over the counter Monistat, with no much relief. She denies any additional symptoms.

## 2023-07-01 LAB — CERVICOVAGINAL ANCILLARY ONLY
Bacterial Vaginitis (gardnerella): NEGATIVE
Candida Glabrata: POSITIVE — AB
Candida Vaginitis: NEGATIVE
Comment: NEGATIVE
Comment: NEGATIVE
Comment: NEGATIVE

## 2023-07-04 ENCOUNTER — Other Ambulatory Visit: Payer: Self-pay | Admitting: Obstetrics and Gynecology

## 2023-07-04 ENCOUNTER — Ambulatory Visit

## 2023-07-04 NOTE — Progress Notes (Signed)
 Patient tested positive for Candida Glabrata. She should do vaginal boric acid suppositories nightly for 3 weeks.

## 2023-07-11 ENCOUNTER — Encounter (HOSPITAL_BASED_OUTPATIENT_CLINIC_OR_DEPARTMENT_OTHER): Payer: Self-pay | Admitting: Radiology

## 2023-07-11 ENCOUNTER — Ambulatory Visit (HOSPITAL_BASED_OUTPATIENT_CLINIC_OR_DEPARTMENT_OTHER)
Admission: RE | Admit: 2023-07-11 | Discharge: 2023-07-11 | Disposition: A | Discharge status: Home / Self Care | Source: Ambulatory Visit | Attending: Nurse Practitioner | Admitting: Nurse Practitioner

## 2023-07-11 DIAGNOSIS — Z1231 Encounter for screening mammogram for malignant neoplasm of breast: Secondary | ICD-10-CM | POA: Insufficient documentation

## 2023-07-22 ENCOUNTER — Telehealth (HOSPITAL_BASED_OUTPATIENT_CLINIC_OR_DEPARTMENT_OTHER): Payer: Self-pay

## 2023-07-22 DIAGNOSIS — J9611 Chronic respiratory failure with hypoxia: Secondary | ICD-10-CM | POA: Diagnosis not present

## 2023-07-22 NOTE — Telephone Encounter (Signed)
 Copied from CRM [id number]. Topic: General - Other >> Jul 21, 2023  4:16 PM Eveleen Hinds B wrote: Reason for CRM: Switching oxygen  companies.  Switching to [employer]. >> Jul 22, 2023  9:18 AM Mary-Hannah B wrote: Is this just an FYI?

## 2023-07-22 NOTE — Telephone Encounter (Signed)
 Copied from CRM [id number]. Topic: Clinical - Prescription Issue >> Jul 22, 2023  1:50 PM Tyronne Galloway wrote: Reason for CRM: Chuerie from [employer]  called stating she received medical records for the patient that are outdated. The medical records are from 03/23/2022. She stated in order to send the patient the oxygen , she would need information from a qualifying testing or a visit in the last 12 months, with a signature and date from the pt's provider, and a qualifying diagnoses from the provider for oxygen . Fax number [phone] and phone number is [phone].

## 2023-07-25 NOTE — Telephone Encounter (Signed)
Printed will fax

## 2023-07-26 DIAGNOSIS — J449 Chronic obstructive pulmonary disease, unspecified: Secondary | ICD-10-CM | POA: Diagnosis not present

## 2023-08-01 ENCOUNTER — Telehealth (HOSPITAL_BASED_OUTPATIENT_CLINIC_OR_DEPARTMENT_OTHER): Payer: Self-pay

## 2023-08-01 NOTE — Telephone Encounter (Signed)
 She has a Product manager and a POC the POC she received on 06/21/2023 per [employer]. Pt just wants the [employer] brand they instructed her she needed to do a walk test to get this she has an upcoming appt on 5/22 we will do a walk with POC at this visit to qualify as she is not getting battery life out of her current one hence the reason she wants to switch.

## 2023-08-01 NOTE — Telephone Encounter (Signed)
 Copied from CRM [id number]. Topic: Clinical - Prescription Issue >> Jul 29, 2023  4:17 PM Tyronne Galloway wrote: Reason for CRM: Pt called stating Dr. Villa Greaser needs to send an order to [employer] regarding an o2 concentrator. The order sent previously was outdated. Please contact [employer] for what is required.

## 2023-08-02 ENCOUNTER — Encounter: Payer: Self-pay | Admitting: Obstetrics and Gynecology

## 2023-08-02 ENCOUNTER — Ambulatory Visit (INDEPENDENT_AMBULATORY_CARE_PROVIDER_SITE_OTHER): Payer: 59 | Admitting: Obstetrics and Gynecology

## 2023-08-02 VITALS — BP 97/56 | HR 90

## 2023-08-02 DIAGNOSIS — N8110 Cystocele, unspecified: Secondary | ICD-10-CM

## 2023-08-02 DIAGNOSIS — N993 Prolapse of vaginal vault after hysterectomy: Secondary | ICD-10-CM

## 2023-08-02 DIAGNOSIS — N813 Complete uterovaginal prolapse: Secondary | ICD-10-CM

## 2023-08-02 DIAGNOSIS — N952 Postmenopausal atrophic vaginitis: Secondary | ICD-10-CM

## 2023-08-02 NOTE — Progress Notes (Signed)
 [facility]   Subjective:     Chief Complaint:  Chief Complaint  Patient presents with   Pessary Check   History of Present Illness: Monica Scott is a 68 y.o. female with stage III pelvic organ prolapse who presents for a pessary check. She is using a size #3 ring with support pessary. The pessary has been working well and she has no complaints. She is using vaginal estrogen cream but not consistently. She endorses some minimal vaginal bleeding.  Past Medical History: Patient  has a past medical history of Arthritis, Asthma, Bladder disease, COPD (chronic obstructive pulmonary disease) (HCC), Depression, GERD (gastroesophageal reflux disease), Hyperlipidemia, Hypertension, Peptic ulcer, and Urinary retention.   Past Surgical History: She  has a past surgical history that includes Vaginal hysterectomy; Pilonidal cyst excision; Colonoscopy with propofol  (N/A, 04/24/2018); Esophagogastroduodenoscopy (egd) with propofol  (N/A, 04/24/2018); maloney dilation (N/A, 04/24/2018); biopsy (04/24/2018); polypectomy (04/24/2018); Colonoscopy with propofol  (N/A, 09/21/2021); and polypectomy (09/21/2021).   Medications: She has a current medication list which includes the following prescription(s): acetaminophen , albuterol , amlodipine , aspirin  ec, bupropion , diphenoxylate -atropine , ohtuvayre , estradiol , estradiol , fluoxetine , trelegy ellipta , gabapentin , ibuprofen , ipratropium-albuterol , methocarbamol , olanzapine , olmesartan , omeprazole , potassium chloride  sa, prednisone , and rosuvastatin .   Allergies: Patient is allergic to tizanidine .   Social History: Patient  reports that she quit smoking about 13 years ago. Her smoking use included cigarettes. She started smoking about 53 years ago. She has a 60 pack-year smoking history. She has never been exposed to tobacco smoke. She has never used smokeless tobacco. She reports that she does not drink alcohol and does not use drugs.       Objective:    Physical Exam: BP (!) 97/56   Pulse 90  Gen: No apparent distress, A&O x 3. Detailed Urogynecologic Evaluation:  Pelvic Exam: Normal external female genitalia; Bartholin's and Skene's glands normal in appearance; urethral meatus normal in appearance, no urethral masses or discharge. The pessary was noted to be in place. It was removed and cleaned. Speculum exam revealed healed mild bleeding at the apex on the left side, no silver nitrate needed. The pessary was replaced. It was comfortable to the patient and fit well.    Assessment/Plan:    Assessment: Monica Scott is a 68 y.o. with stage III pelvic organ prolapse here for a pessary check. She is doing well.  Plan: She will keep the pessary in place until next visit. She will continue to use estrogen, encouraged her to use this consistently. She will follow-up in 3 months for a pessary check or sooner as needed.   All questions were answered.

## 2023-08-03 DIAGNOSIS — H43393 Other vitreous opacities, bilateral: Secondary | ICD-10-CM | POA: Diagnosis not present

## 2023-08-09 ENCOUNTER — Encounter: Payer: Self-pay | Admitting: Physician Assistant

## 2023-08-09 ENCOUNTER — Ambulatory Visit (INDEPENDENT_AMBULATORY_CARE_PROVIDER_SITE_OTHER): Admitting: Physician Assistant

## 2023-08-09 VITALS — BP 104/62 | HR 99 | Temp 98.6°F | Ht <= 58 in | Wt 167.4 lb

## 2023-08-09 DIAGNOSIS — I700 Atherosclerosis of aorta: Secondary | ICD-10-CM | POA: Diagnosis not present

## 2023-08-09 DIAGNOSIS — I10 Essential (primary) hypertension: Secondary | ICD-10-CM | POA: Diagnosis not present

## 2023-08-09 DIAGNOSIS — R739 Hyperglycemia, unspecified: Secondary | ICD-10-CM | POA: Diagnosis not present

## 2023-08-09 DIAGNOSIS — M51369 Other intervertebral disc degeneration, lumbar region without mention of lumbar back pain or lower extremity pain: Secondary | ICD-10-CM | POA: Insufficient documentation

## 2023-08-09 DIAGNOSIS — M1612 Unilateral primary osteoarthritis, left hip: Secondary | ICD-10-CM | POA: Insufficient documentation

## 2023-08-09 DIAGNOSIS — E782 Mixed hyperlipidemia: Secondary | ICD-10-CM

## 2023-08-09 DIAGNOSIS — M4306 Spondylolysis, lumbar region: Secondary | ICD-10-CM | POA: Insufficient documentation

## 2023-08-09 DIAGNOSIS — R748 Abnormal levels of other serum enzymes: Secondary | ICD-10-CM

## 2023-08-09 DIAGNOSIS — J438 Other emphysema: Secondary | ICD-10-CM | POA: Diagnosis not present

## 2023-08-09 LAB — COMPREHENSIVE METABOLIC PANEL WITH GFR
ALT: 25 U/L (ref 0–35)
AST: 20 U/L (ref 0–37)
Albumin: 4.2 g/dL (ref 3.5–5.2)
Alkaline Phosphatase: 79 U/L (ref 39–117)
BUN: 11 mg/dL (ref 6–23)
CO2: 29 meq/L (ref 19–32)
Calcium: 8.9 mg/dL (ref 8.4–10.5)
Chloride: 102 meq/L (ref 96–112)
Creatinine, Ser: 0.99 mg/dL (ref 0.40–1.20)
GFR: 58.86 mL/min — ABNORMAL LOW (ref >60.00)
Glucose, Bld: 95 mg/dL (ref 70–99)
Potassium: 4 meq/L (ref 3.5–5.1)
Sodium: 140 meq/L (ref 135–145)
Total Bilirubin: 0.4 mg/dL (ref 0.2–1.2)
Total Protein: 6.4 g/dL (ref 6.0–8.3)

## 2023-08-09 LAB — LIPID PANEL
Cholesterol: 172 mg/dL (ref 0–200)
HDL: 59.3 mg/dL (ref >39.00)
LDL Cholesterol: 58 mg/dL (ref 0–99)
NonHDL: 112.34
Total CHOL/HDL Ratio: 3
Triglycerides: 270 mg/dL — ABNORMAL HIGH (ref 0.0–149.0)
VLDL: 54 mg/dL — ABNORMAL HIGH (ref 0.0–40.0)

## 2023-08-09 LAB — HEMOGLOBIN A1C: Hgb A1c MFr Bld: 6.3 % (ref 4.6–6.5)

## 2023-08-09 NOTE — Progress Notes (Signed)
 Patient ID: Monica Scott, female    DOB: March 12, 1956, 68 y.o.   MRN: [id number]   Assessment & Plan:  Hyperglycemia -     Comprehensive metabolic panel with GFR -     Hemoglobin A1c  Mixed hyperlipidemia -     Lipid panel  Other emphysema (HCC)  Aortic atherosclerosis (HCC)  Elevated alkaline phosphatase level -     Comprehensive metabolic panel with GFR  Hypertension, essential, benign      Assessment and Plan Assessment & Plan COPD Chronic obstructive pulmonary disease managed with home oxygen  therapy for four to five years. Symptoms include dyspnea on exertion, likely exacerbated by weight gain. No current pulmonologist follow-up. - Refer to pulmonology for follow-up - Encourage weight loss to improve respiratory symptoms  Possibility of diabetes Elevated A1c reported by home health nurse suggests possible diabetes. Previous A1c was 6.0 ten months ago. High soda intake may contribute to elevated blood glucose levels. Blood work will determine diabetic status and influence medication options. - Order A1c and comprehensive metabolic panel - Discuss potential diabetes management options, including metformin, Mounjaro , or Ozempic , based on lab results - Encourage reduction of soda intake and substitution with water or flavored water  Hypertension Hypertension managed with amlodipine . Olmesartan  was discontinued due to hypotension and dizziness. Blood pressure is well-controlled with amlodipine  alone. Cardiologist is aware of the current medication regimen. - Continue amlodipine  - Monitor blood pressure regularly - Discuss blood pressure management with cardiologist  Mixed hyperlipidemia Mixed hyperlipidemia managed with Crestor  40 mg daily. Cardiologist requested re-evaluation of lipid panel. - Order lipid panel - Send lipid panel results to cardiologist  Alcohol dependence Alcohol dependence with 13 years of sobriety. No current alcohol use. - Continue to  support sobriety      Return if symptoms worsen or fail to improve.    Subjective:    Chief Complaint  Patient presents with   New Patient (Initial Visit)    New pt in office to est care; pt last saw Jamal Mays 4 months ago; pt is wanting to discuss weight loss medication at this visit; pt on 4L of oxygen     HPI Discussed the use of AI scribe software for clinical note transcription with the patient, who gave verbal consent to proceed.  History of Present Illness Monica NAEVE "Thersia Flax" is a 68 year old female who presents for establishment of care and evaluation of potential diabetes.  She is establishing care after being with her previous provider for four to five years. She needs to check her A1c levels as she was informed by a home health provider that her blood sugar was 'really high', suggesting possible diabetes.  She has a history of COPD and has been on oxygen  therapy for about four to five years. The oxygen  provides some relief, but she experiences dyspnea, especially when ambulating, which she attributes to her weight. She does not currently see a pulmonologist.  She has a history of hypertension and manages her blood pressure with amlodipine . She recently discontinued olmesartan  due to episodes of hypotension, which caused lightheadedness and dizziness. Her blood pressure has been stable since stopping olmesartan .  She struggles with weight management and wants to lose weight. She attributes some of her dyspnea while walking to her weight. She admits to poor dietary habits and high soda consumption, which she plans to reduce.  Her past medical history includes bilateral carotid artery stenosis, aortic atherosclerosis, and mixed hyperlipidemia. She takes Crestor  40 mg daily and a  baby aspirin  for these conditions.  She has a history of alcohol use disorder but has been sober for thirteen years. She also quit smoking after stopping alcohol.  No numbness or tingling in  hands or feet, no chest pain, and her vision and dental care are up to date.     Past Medical History:  Diagnosis Date   Arthritis    Asthma    Bladder disease    COPD (chronic obstructive pulmonary disease) (HCC)    Depression    GERD (gastroesophageal reflux disease)    Hyperlipidemia    Hypertension    Peptic ulcer    Urinary retention     Past Surgical History:  Procedure Laterality Date   BIOPSY  04/24/2018   Procedure: BIOPSY;  Surgeon: Suzette Espy, MD;  [facility];  Service: Endoscopy;;  esophagus   COLONOSCOPY WITH PROPOFOL  N/A 04/24/2018   Procedure: COLONOSCOPY WITH PROPOFOL ;  Surgeon: Suzette Espy, MD;  [facility];  Service: Endoscopy;  Laterality: N/A;  9:00am   COLONOSCOPY WITH PROPOFOL  N/A 09/21/2021   Procedure: COLONOSCOPY WITH PROPOFOL ;  Surgeon: Suzette Espy, MD;  [facility];  Service: Endoscopy;  Laterality: N/A;  2:15 / ASA 2   ESOPHAGOGASTRODUODENOSCOPY (EGD) WITH PROPOFOL  N/A 04/24/2018   Procedure: ESOPHAGOGASTRODUODENOSCOPY (EGD) WITH PROPOFOL ;  Surgeon: Suzette Espy, MD;  [facility];  Service: Endoscopy;  Laterality: N/A;   MALONEY DILATION N/A 04/24/2018   Procedure: Londa Rival DILATION;  Surgeon: Suzette Espy, MD;  [facility];  Service: Endoscopy;  Laterality: N/A;   PILONIDAL CYST EXCISION     age 76   POLYPECTOMY  04/24/2018   Procedure: POLYPECTOMY;  Surgeon: Suzette Espy, MD;  [facility];  Service: Endoscopy;;  colon    POLYPECTOMY  09/21/2021   Procedure: POLYPECTOMY;  Surgeon: Suzette Espy, MD;  [facility];  Service: Endoscopy;;   VAGINAL HYSTERECTOMY      Family History  Problem Relation Age of Onset   Alcohol abuse Mother    Arthritis Mother    Cancer Mother    Depression Mother    Early death Mother    Alcohol abuse Father    Depression Father    Alcohol abuse Sister    Depression Sister    Alcohol abuse Daughter     Depression Daughter    COPD Maternal Grandfather    Heart disease Maternal Grandfather    Hypertension Maternal Grandfather    Alcohol abuse Brother    Colon cancer Brother        deceased at age 15   Alcohol abuse Son    Breast cancer Neg Hx     Social History   Tobacco Use   Smoking status: Former    Current packs/day: 0.00    Average packs/day: 1.5 packs/day for 40.0 years (60.0 ttl pk-yrs)    Types: Cigarettes    Start date: 04/19/1970    Quit date: 04/19/2010    Years since quitting: 13.3    Passive exposure: Never   Smokeless tobacco: Never  Vaping Use   Vaping status: Never Used  Substance Use Topics   Alcohol use: No    Comment: Former ETOH. none since 2000   Drug use: No     Allergies  Allergen Reactions   Tizanidine  Other (See Comments)    Caused dizziness and dry mouth. Patient prefers not to take this again.    Review of  Systems NEGATIVE UNLESS OTHERWISE INDICATED IN HPI      Objective:     BP 104/62 (BP Location: Left Arm, Patient Position: Sitting, Cuff Size: Normal)   Pulse 99   Temp 98.6 F (37 C) (Temporal)   Ht 4\' 9"  (1.448 m)   Wt 167 lb 6.4 oz (75.9 kg)   SpO2 92% Comment: pt on 4 L o2  BMI 36.23 kg/m   Wt Readings from Last 3 Encounters:  08/09/23 167 lb 6.4 oz (75.9 kg)  06/24/23 162 lb 4.8 oz (73.6 kg)  05/02/23 165 lb 6.4 oz (75 kg)    BP Readings from Last 3 Encounters:  08/09/23 104/62  08/02/23 (!) 97/56  06/30/23 108/73     Physical Exam Vitals and nursing note reviewed.  Constitutional:      Appearance: Normal appearance. She is obese.  Eyes:     Extraocular Movements: Extraocular movements intact.     Conjunctiva/sclera: Conjunctivae normal.     Pupils: Pupils are equal, round, and reactive to light.  Cardiovascular:     Rate and Rhythm: Normal rate and regular rhythm.     Heart sounds: Normal heart sounds.  Pulmonary:     Breath sounds: Decreased breath sounds present.     Comments: Portable  oxygen  Musculoskeletal:     Right lower leg: No edema.     Left lower leg: No edema.  Neurological:     General: No focal deficit present.     Mental Status: She is alert and oriented to person, place, and time.  Psychiatric:        Mood and Affect: Mood normal.             Kelilah Hebard M Mikyla Schachter, PA-C

## 2023-08-09 NOTE — Patient Instructions (Signed)
 Welcome to [facility]! It was a pleasure meeting you today.  Labs today Recheck after labs    PLEASE NOTE:  If you had any LAB tests please let us  know if you have not heard back within a few days. You may see your results on MyChart before we have a chance to review them but we will give you a call once they are reviewed by us . If we ordered any REFERRALS today, please let us  know if you have not heard from their office within the next two weeks. Let us  know through MyChart if you are needing REFILLS, or have your pharmacy send us  the request. You can also use MyChart to communicate with me or any office staff.  Please try these tips to maintain a healthy lifestyle:  Eat most of your calories during the day when you are active. Eliminate processed foods including packaged sweets (pies, cakes, cookies), reduce intake of potatoes, white bread, white pasta, and white rice. Look for whole grain options, oat flour or almond flour.  Each meal should contain half fruits/vegetables, one quarter protein, and one quarter carbs (no bigger than a computer mouse).  Cut down on sweet beverages. This includes juice, soda, and sweet tea. Also watch fruit intake, though this is a healthier sweet option, it still contains natural sugar! Limit to 3 servings daily.  Drink at least 1 glass of water with each meal and aim for at least 8 glasses (64 ounces) per day.  Exercise at least 150 minutes every week to the best of your ability.    Take Care,  Beckett Hickmon, PA-C

## 2023-08-10 ENCOUNTER — Ambulatory Visit: Payer: Self-pay | Admitting: Physician Assistant

## 2023-08-11 ENCOUNTER — Telehealth: Payer: Self-pay

## 2023-08-11 ENCOUNTER — Other Ambulatory Visit: Payer: Self-pay | Admitting: Nurse Practitioner

## 2023-08-11 DIAGNOSIS — G2581 Restless legs syndrome: Secondary | ICD-10-CM

## 2023-08-11 NOTE — Telephone Encounter (Signed)
 Copied from CRM [id number]. Topic: Clinical - Medication Question >> Aug 11, 2023  8:35 AM Clydene Darner H wrote: Reason for CRM: Patient called stating she received a call regarding lab results from 08/09/23. She reported being informed during her last office visit that a weight loss medication would be prescribed following the lab results. She is calling to follow up on next steps.  Please see patient call msg and advise

## 2023-08-11 NOTE — Telephone Encounter (Signed)
 Spoke with pt and advised lab results, pt verbalized understanding, scheduled follow up as directed by PCP; pt states will be fasting for next visit to repeat lipid labs

## 2023-08-18 ENCOUNTER — Encounter (HOSPITAL_BASED_OUTPATIENT_CLINIC_OR_DEPARTMENT_OTHER): Payer: Self-pay | Admitting: Pulmonary Disease

## 2023-08-18 ENCOUNTER — Ambulatory Visit (INDEPENDENT_AMBULATORY_CARE_PROVIDER_SITE_OTHER): Admitting: Pulmonary Disease

## 2023-08-18 VITALS — BP 125/78 | HR 86 | Ht <= 58 in | Wt 167.8 lb

## 2023-08-18 DIAGNOSIS — Z87891 Personal history of nicotine dependence: Secondary | ICD-10-CM | POA: Diagnosis not present

## 2023-08-18 DIAGNOSIS — J9611 Chronic respiratory failure with hypoxia: Secondary | ICD-10-CM | POA: Diagnosis not present

## 2023-08-18 DIAGNOSIS — J4489 Other specified chronic obstructive pulmonary disease: Secondary | ICD-10-CM

## 2023-08-18 DIAGNOSIS — J449 Chronic obstructive pulmonary disease, unspecified: Secondary | ICD-10-CM

## 2023-08-18 NOTE — Progress Notes (Signed)
 Subjective:    Patient ID: Monica Scott, female    DOB: 1955/04/09, 68 y.o.   MRN: [id number]  HPI  68 yo ex-smoker  followed for COPD and chronic hypoxic respiratory failure on oxygen  since July 2019 -on Oxygen  3l/m rest and 4l/m with activity      PMH-  mild obstructive sleep apnea declined CPAP Neurogenic bladder with self-catheterization   Meds -Trelegy not working well   01/2023 started Misty Amour, pred taper  3 month FU visit She has a 5L concentrator and a POC the POC she received on 06/21/2023 per [facility]. Pt just wants the INOGEN brand they instructed her she needed to do a walk test to get this she has an upcoming appt on 5/22 we will do a walk with POC at this visit to qualify as she is not getting battery life out of her current one hence the reason she wants to switch.   She experiences difficulties with her current POC due to its weight and insufficient battery life. She is interested in switching to an Inogen POC, which is covered by her insurance, and has coordinated with the supplier for the switch.  She struggles with breathing and has increased wheezing. She uses a nebulizer twice a day without significant relief. She is on Trelegy and uses Albuterol  a couple of times a day, especially during this time of the year when her symptoms worsen.  She uses four liters of oxygen  with her POC and three liters at night with her home machine.  Significant tests/ events reviewed PFTs 09/2019 -severe airway obstruction, ratio 51, FEV1 46%, no bronchodilator response, TLC 138% showing hyperinflation, DLCO 73%  CT chest w con 04/2023 no LNs or nodules   LDCT 09/2022 >> mild emphysema, postinfectious scarring, stable nodules  CTA 07/2022 subcarinal LN 1.6 cm LDCT 09/2021 >> RADS-2  LDCT 02/2020 RADS 1, no suspicious nodules noted.,  Emphysema   CT chest 04/2018 shows changes of emphysema, mild ectasia of thoracic aorta 3.5 cm   2D echo February 13, 2019 showed a normal EF at 60 to  65%.    NPSG was done on February 13, 2019 that showed mild obstructive sleep apnea with AHI at 7.6 and SPO2 low at 73%.      Alpha-1 antitrypsin-MM, 139  Review of Systems neg for any significant sore throat, dysphagia, itching, sneezing, nasal congestion or excess/ purulent secretions, fever, chills, sweats, unintended wt loss, pleuritic or exertional cp, hempoptysis, orthopnea pnd or change in chronic leg swelling. Also denies presyncope, palpitations, heartburn, abdominal pain, nausea, vomiting, diarrhea or change in bowel or urinary habits, dysuria,hematuria, rash, arthralgias, visual complaints, headache, numbness weakness or ataxia.     Objective:   Physical Exam  Gen. Pleasant, obese, in no distress ENT - no lesions, no post nasal drip Neck: No JVD, no thyromegaly, no carotid bruits Lungs: no use of accessory muscles, no dullness to percussion, decreased without rales or rhonchi  Cardiovascular: Rhythm regular, heart sounds  normal, no murmurs or gallops, no peripheral edema Musculoskeletal: No deformities, no cyanosis or clubbing , no tremors       Assessment & Plan:  COPD with chronic hypoxic respiratory failure COPD with chronic hypoxic respiratory failure, ongoing dyspnea and wheezing. Current portable oxygen  concentrator (POC) is too heavy and has insufficient battery life. CT chest with contrast in February showed no lymphadenopathy. She is on maximum medication therapy including Trelegy, nebulized oxygen , and albuterol  for rescue. Reports nebulizer is not significantly helpful. Insurance will  cover a switch to an Inogen POC, expected to have a longer battery life. - Prescribe Inogen POC with 4 liters per minute. - Continue Trelegy and nebulized oxygen . - Use albuterol  as needed for rescue. - Discuss signs and symptoms of exacerbation. - Continue annual lung cancer screening with CT.  Chronic hypoxic resp failure Hypoxia secondary to COPD with chronic hypoxic  respiratory failure. Current oxygen  therapy includes 4 liters per minute via POC and 3 liters per minute continuous at night with home machine. - Continue 4 liters per minute POC during the day. - Continue 3 liters per minute continuous oxygen  at night.

## 2023-08-18 NOTE — Patient Instructions (Signed)
 VISIT SUMMARY:  Today, we discussed your ongoing issues with COPD and chronic hypoxic respiratory failure, particularly focusing on the difficulties you are experiencing with your current portable oxygen  concentrator (POC). We reviewed your current medications and oxygen  therapy, and we talked about switching to a new POC that is covered by your insurance.  YOUR PLAN:  -COPD WITH CHRONIC HYPOXIC RESPIRATORY FAILURE: COPD, or Chronic Obstructive Pulmonary Disease, is a long-term lung condition that makes it hard to breathe. Chronic hypoxic respiratory failure means your lungs are not getting enough oxygen  into your blood. We will switch you to an Inogen portable oxygen  concentrator (POC) that should be lighter and have a longer battery life. Continue using Trelegy and your nebulized oxygen  as prescribed, and use albuterol  as needed for sudden breathing difficulties. We also discussed the signs and symptoms of a COPD exacerbation, which you should watch out for. Continue with your annual lung cancer screening using a CT scan.  -HYPOXIA: Hypoxia means that your body is not getting enough oxygen . This is secondary to your COPD and chronic hypoxic respiratory failure. Continue using 4 liters per minute of oxygen  with your POC during the day and 3 liters per minute of continuous oxygen  at night with your home machine.  INSTRUCTIONS:  Please follow up with us  if you experience any worsening of your symptoms or if you have any issues with your new Inogen POC. Continue with your annual lung cancer screening using a CT scan.

## 2023-08-18 NOTE — Addendum Note (Signed)
 Addended by: Kary Pages on: 08/18/2023 10:54 AM   Modules accepted: Orders

## 2023-08-21 DIAGNOSIS — J9611 Chronic respiratory failure with hypoxia: Secondary | ICD-10-CM | POA: Diagnosis not present

## 2023-08-25 DIAGNOSIS — J449 Chronic obstructive pulmonary disease, unspecified: Secondary | ICD-10-CM | POA: Diagnosis not present

## 2023-08-30 DIAGNOSIS — J449 Chronic obstructive pulmonary disease, unspecified: Secondary | ICD-10-CM | POA: Diagnosis not present

## 2023-09-04 ENCOUNTER — Other Ambulatory Visit: Payer: Self-pay | Admitting: Adult Health

## 2023-09-29 DIAGNOSIS — J449 Chronic obstructive pulmonary disease, unspecified: Secondary | ICD-10-CM | POA: Diagnosis not present

## 2023-10-19 ENCOUNTER — Other Ambulatory Visit: Payer: Self-pay | Admitting: Nurse Practitioner

## 2023-10-19 DIAGNOSIS — I10 Essential (primary) hypertension: Secondary | ICD-10-CM

## 2023-10-20 ENCOUNTER — Other Ambulatory Visit: Payer: Self-pay | Admitting: *Deleted

## 2023-10-20 DIAGNOSIS — Z122 Encounter for screening for malignant neoplasm of respiratory organs: Secondary | ICD-10-CM

## 2023-10-20 DIAGNOSIS — Z87891 Personal history of nicotine dependence: Secondary | ICD-10-CM

## 2023-10-26 ENCOUNTER — Other Ambulatory Visit: Payer: Self-pay | Admitting: Nurse Practitioner

## 2023-10-26 DIAGNOSIS — I10 Essential (primary) hypertension: Secondary | ICD-10-CM

## 2023-10-30 DIAGNOSIS — J449 Chronic obstructive pulmonary disease, unspecified: Secondary | ICD-10-CM | POA: Diagnosis not present

## 2023-11-01 ENCOUNTER — Encounter

## 2023-11-01 NOTE — Progress Notes (Unsigned)
Pt no-show.This encounter was created in error - please disregard.

## 2023-11-03 ENCOUNTER — Encounter: Payer: Self-pay | Admitting: Obstetrics and Gynecology

## 2023-11-03 ENCOUNTER — Other Ambulatory Visit (HOSPITAL_COMMUNITY)
Admission: RE | Admit: 2023-11-03 | Discharge: 2023-11-03 | Disposition: A | Discharge status: Home / Self Care | Source: Ambulatory Visit | Attending: Obstetrics and Gynecology | Admitting: Obstetrics and Gynecology

## 2023-11-03 ENCOUNTER — Ambulatory Visit (INDEPENDENT_AMBULATORY_CARE_PROVIDER_SITE_OTHER): Admitting: Obstetrics and Gynecology

## 2023-11-03 VITALS — BP 126/79 | HR 97

## 2023-11-03 DIAGNOSIS — N898 Other specified noninflammatory disorders of vagina: Secondary | ICD-10-CM

## 2023-11-03 DIAGNOSIS — N993 Prolapse of vaginal vault after hysterectomy: Secondary | ICD-10-CM

## 2023-11-03 DIAGNOSIS — N952 Postmenopausal atrophic vaginitis: Secondary | ICD-10-CM

## 2023-11-03 DIAGNOSIS — N813 Complete uterovaginal prolapse: Secondary | ICD-10-CM

## 2023-11-03 DIAGNOSIS — N8110 Cystocele, unspecified: Secondary | ICD-10-CM

## 2023-11-03 NOTE — Progress Notes (Signed)
 [facility]   Subjective:     Chief Complaint:  Chief Complaint  Patient presents with   Pessary Check   History of Present Illness: Monica Scott is a 68 y.o. female with stage III pelvic organ prolapse who presents for a pessary check. She is using a size #3 ring with support pessary. The pessary has been working well and she has no complaints. She is using vaginal estrogen cream but not consistently. She endorses some minimal vaginal bleeding. Patient also reports she has concerns about a yeast infection as she has had some vaginal irritation.   Past Medical History: Patient  has a past medical history of Arthritis, Asthma, Bladder disease, COPD (chronic obstructive pulmonary disease) (HCC), Depression, GERD (gastroesophageal reflux disease), Hyperlipidemia, Hypertension, Peptic ulcer, and Urinary retention.   Past Surgical History: She  has a past surgical history that includes Vaginal hysterectomy; Pilonidal cyst excision; Colonoscopy with propofol  (N/A, 04/24/2018); Esophagogastroduodenoscopy (egd) with propofol  (N/A, 04/24/2018); maloney dilation (N/A, 04/24/2018); biopsy (04/24/2018); polypectomy (04/24/2018); Colonoscopy with propofol  (N/A, 09/21/2021); and polypectomy (09/21/2021).   Medications: She has a current medication list which includes the following prescription(s): acetaminophen , albuterol , alendronate , amlodipine , aspirin  ec, bupropion , buspirone, ohtuvayre , estradiol , estradiol , fluoxetine , ibuprofen , methocarbamol , olanzapine , olmesartan , omeprazole , potassium chloride  sa, rosuvastatin , and trelegy ellipta .   Allergies: Patient is allergic to tizanidine .   Social History: Patient  reports that she quit smoking about 13 years ago. Her smoking use included cigarettes. She started smoking about 53 years ago. She has a 60 pack-year smoking history. She has never been exposed to tobacco smoke. She has never used smokeless tobacco. She reports that she  does not drink alcohol and does not use drugs.      Objective:    Physical Exam: BP 126/79 (BP Location: Left Arm, Patient Position: Sitting, Cuff Size: Normal)   Pulse 97  Gen: No apparent distress, A&O x 3. Detailed Urogynecologic Evaluation:  Pelvic Exam: Normal external female genitalia; Bartholin's and Skene's glands normal in appearance; urethral meatus normal in appearance, no urethral masses or discharge. The pessary was noted to be in place. It was removed and cleaned. Speculum exam revealed mild irritation at the apex that was not actively bleeding. Small amount of vaginal discharge noted, aptima swab obtained. The pessary was replaced. It was comfortable to the patient and fit well.    Assessment/Plan:    Assessment: Monica Scott is a 68 y.o. with stage III pelvic organ prolapse here for a pessary check. She is doing well.  Plan: She will keep the pessary in place until next visit. She will continue to use estrogen, encouraged her to use this consistently. She will follow-up in 3 months for a pessary check or sooner as needed.   All questions were answered.

## 2023-11-04 LAB — CERVICOVAGINAL ANCILLARY ONLY
Bacterial Vaginitis (gardnerella): NEGATIVE
Candida Glabrata: POSITIVE — AB
Candida Vaginitis: POSITIVE — AB
Comment: NEGATIVE
Comment: NEGATIVE
Comment: NEGATIVE

## 2023-11-07 ENCOUNTER — Ambulatory Visit: Payer: Self-pay | Admitting: Obstetrics and Gynecology

## 2023-11-07 DIAGNOSIS — N898 Other specified noninflammatory disorders of vagina: Secondary | ICD-10-CM

## 2023-11-07 MED ORDER — FLUCONAZOLE 150 MG PO TABS: 150.0000 mg | ORAL_TABLET | Freq: Once | ORAL | 0 refills | Status: AC

## 2023-11-11 ENCOUNTER — Other Ambulatory Visit: Payer: Self-pay | Admitting: Nurse Practitioner

## 2023-11-11 DIAGNOSIS — I10 Essential (primary) hypertension: Secondary | ICD-10-CM

## 2023-11-21 ENCOUNTER — Other Ambulatory Visit: Payer: Self-pay | Admitting: Pulmonary Disease

## 2023-11-21 DIAGNOSIS — J449 Chronic obstructive pulmonary disease, unspecified: Secondary | ICD-10-CM

## 2023-11-30 DIAGNOSIS — J449 Chronic obstructive pulmonary disease, unspecified: Secondary | ICD-10-CM | POA: Diagnosis not present

## 2023-12-20 ENCOUNTER — Ambulatory Visit (HOSPITAL_BASED_OUTPATIENT_CLINIC_OR_DEPARTMENT_OTHER): Admitting: Cardiology

## 2023-12-20 NOTE — Progress Notes (Incomplete)
  Cardiology Office Note:  .   Date:  12/20/2023  ID:  Monica Scott, DOB Jun 23, 1955, MRN [id number] PCP: Allwardt, Mardy HERO, PA-C  [facility] Providers Cardiologist:  Shelda Bruckner, MD {  History of Present Illness: Monica Scott is a 68 y.o. female with a hx of carotid artery stenosis, hypertension, hyperlipidemia, prediabetes, GERD, COPD with chronic hypoxic respiratory failure on home O2 since 2019, neurogenic bladder requiring self catheterization, and peptic ulcer. I met her 02/22/2022 for evaluation of carotid stenosis and hypertension.  Pertinent CV history: CTA 2023 was negative for large vessel occlusion, positive for bulky atherosclerosis at the Left CCA and ICA origins with 65-68% stenosis at both, note near complete retropharyngeal course of both carotids. Started on atorvastatin .   Today: Since our last visit, she was seen by Reche Finder on 06/24/23. She was stable at that time.   ROS: Denies chest pain, shortness of breath at rest or with normal exertion. No PND, orthopnea, LE edema or unexpected weight gain. No syncope or palpitations. ROS otherwise negative except as noted.   Studies Reviewed: SABRA    EKG:       Physical Exam:   VS:  There were no vitals taken for this visit.   Wt Readings from Last 3 Encounters:  08/18/23 167 lb 12.8 oz (76.1 kg)  08/09/23 167 lb 6.4 oz (75.9 kg)  06/24/23 162 lb 4.8 oz (73.6 kg)    GEN: Well nourished, well developed in no acute distress HEENT: Normal, moist mucous membranes NECK: No JVD CARDIAC: regular rhythm, normal S1 and S2, no rubs or gallops. No murmur. VASCULAR: Radial and DP pulses 2+ bilaterally. No carotid bruits RESPIRATORY:  Clear to auscultation without rales, wheezing or rhonchi  ABDOMEN: Soft, non-tender, non-distended MUSCULOSKELETAL:  Ambulates independently SKIN: Warm and dry, no edema NEUROLOGIC:  Alert and oriented x 3. No focal neuro deficits noted. PSYCHIATRIC:  Normal affect     ASSESSMENT AND PLAN: .    Bilateral carotid stenosis Aortic atherosclerosis Mixed hyperlipidemia -continue aspirin  81 mg daily. Does have history of peptic ulcer, no melena/hematochezia noted -lipids from 07/2023 show TG 270, LDL 58 on rosuvastatin  40 mg daily. Given max dose rosuvastatin  and elevated TG, we discussed vascepa given outcomes data vs. Fenofibrate (less outcomes data, need to monitor LFTs). -counseled on red flag warning signs that need immediate medical attention -reviewed diet and exercise recommendations at length   Hypertension -continued amlodipine , olmesartan   CV risk counseling and prevention -recommend heart healthy/Mediterranean diet, with whole grains, fruits, vegetable, fish, lean meats, nuts, and olive oil. Limit salt. -recommend moderate walking, 3-5 times/week for 30-50 minutes each session. Aim for at least 150 minutes/week. Goal should be pace of 3 miles/hours, or walking 1.5 miles in 30 minutes -recommend avoidance of tobacco products. Avoid excess alcohol.  Dispo: ***  Signed, Shelda Bruckner, MD   Shelda Bruckner, MD, PhD, [facility]  [facility]  [place]  [facility] at [facility] [address], [address] [place], [place] [address] [phone]

## 2023-12-23 ENCOUNTER — Encounter (HOSPITAL_BASED_OUTPATIENT_CLINIC_OR_DEPARTMENT_OTHER): Payer: Self-pay | Admitting: Family

## 2023-12-23 ENCOUNTER — Ambulatory Visit (INDEPENDENT_AMBULATORY_CARE_PROVIDER_SITE_OTHER): Admitting: Family

## 2023-12-23 VITALS — BP 150/82 | HR 81 | Resp 17 | Ht <= 58 in | Wt 179.0 lb

## 2023-12-23 DIAGNOSIS — E785 Hyperlipidemia, unspecified: Secondary | ICD-10-CM | POA: Diagnosis not present

## 2023-12-23 DIAGNOSIS — I10 Essential (primary) hypertension: Secondary | ICD-10-CM | POA: Diagnosis not present

## 2023-12-23 DIAGNOSIS — I2510 Atherosclerotic heart disease of native coronary artery without angina pectoris: Secondary | ICD-10-CM

## 2023-12-23 MED ORDER — OLMESARTAN MEDOXOMIL 40 MG PO TABS: 40.0000 mg | ORAL_TABLET | Freq: Every day | ORAL | 3 refills | Status: DC

## 2023-12-23 MED ORDER — AMLODIPINE BESYLATE 10 MG PO TABS: 10.0000 mg | ORAL_TABLET | Freq: Every day | ORAL | 3 refills | Status: DC

## 2023-12-23 NOTE — Patient Instructions (Signed)
 Medication Instructions:   Your physician recommends that you continue on your current medications as directed. Please refer to the Current Medication list given to you today.  *If you need a refill on your cardiac medications before your next appointment, please call your pharmacy*    Follow-Up: At [facility], you and your health needs are our priority.  As part of our continuing mission to provide you with exceptional heart care, our providers are all part of one team.  This team includes your primary Cardiologist (physician) and Advanced Practice Providers or APPs (Physician Assistants and Nurse Practitioners) who all work together to provide you with the care you need, when you need it.  Your next appointment:   1 year(s)  Provider:   Reche Finder, NP

## 2023-12-23 NOTE — Progress Notes (Signed)
 Cardiology Office Note:  .   Date:  12/23/2023  ID:  Monica Scott, DOB [birth year], MRN [id number] PCP: Allwardt, Mardy HERO, PA-C  [facility] Providers Cardiologist:  Shelda Bruckner, MD    History of Present Illness: Monica Scott is a 68 y.o. female with hx of carotid stenosis, HTN, HLD, prediabetes, hypokalemia, GERD, COPD, asthma, arthritis, peptic ulcer, aortic atherosclerosis, coronary calcification on CT.   Prior CTA head/neck 01/22/2022 was positive for bulky atherosclerosis at the left CCA and ICA origins with 65 to 68% stenosis at both.  Noted near complete retropharyngeal course of both carotids.  Established with Dr. Bruckner 02/22/2022.  Her BP was controlled on amlodipine , olmesartan  and these were continued.  Medical therapy for carotid stenosis including statin, aspirin  were recommended. As LDL 160, Atorvastatin  changed to Rosuvastatin  40mg  daily.   Lipid panel 02/21/23 total cholesterol 210, triglycerides 222, HDL 69, LDL 199.   She was last seen 06/16/2023.  She was without anginal symptoms.  Her blood pressure was well-controlled.  She had recently resumed Crestor  and if LDL not at goal less than 70 had subsequent labs with primary care encouraged to consider Repatha versus Leqvio.  Presents today for follow up. Reports no shortness of breath at rest and stable dyspnea on exertion related to her lung disease. Remains on home oxygen .. Reports no chest pain, pressure, or tightness. No edema, orthopnea, PND. Reports no palpitations.  Exercise limited by lung issues. Reports her BP at home is elevated with readings such as 150/82, notes she is out of her Amlodipine . Prior to being out of amlodipine  BP routinely <130/80.   ROS: Please see the history of present illness.    All other systems reviewed and are negative.  Studies Reviewed: .        Cardiac Studies & Procedures    ______________________________________________________________________________________________     ECHOCARDIOGRAM  ECHOCARDIOGRAM COMPLETE 02/13/2019  Narrative ECHOCARDIOGRAM REPORT    Patient Name:   Monica Scott Date of Exam: 02/13/2019 Medical Rec #:  [id number]        Height:       59.0 in Accession #:    [id number]       Weight:       169.2 lb Date of Birth:  Monica Scott         BSA:          1.72 m Patient Age:    68 years         BP:           116/64 mmHg Patient Gender: F                HR:           90 bpm. Exam Location:  [address]  Procedure: 2D Echo, 3D Echo, Cardiac Doppler and Color Doppler  Indications:    R06.00 Dyspnea  History:        Patient has no prior history of Echocardiogram examinations. COPD, Signs/Symptoms:Dyspnea; Risk Factors:Family History of Coronary Artery Disease, Hypertension, Dyslipidemia and Former Smoker. Asthma, History of ETOH Abuse, Obesity.  Sonographer:    Heather Hawks RDCS Referring Phys: [phone] [name] S PARRETT  IMPRESSIONS   1. Left ventricular ejection fraction, by visual estimation, is 60 to 65%. The left ventricle has normal function. There is no left ventricular hypertrophy. 2. Global right ventricle has normal systolic function.The right ventricular size is normal. No increase in right ventricular wall thickness. 3. Left atrial size was  normal. 4. Right atrial size was normal. 5. The mitral valve is normal in structure. No evidence of mitral valve regurgitation. 6. The tricuspid valve is normal in structure. Tricuspid valve regurgitation is not demonstrated. 7. The aortic valve is normal in structure. Aortic valve regurgitation is not visualized. No evidence of aortic valve sclerosis or stenosis. 8. The pulmonic valve was not well visualized. Pulmonic valve regurgitation is not visualized. 9. Presence of pericardial fat pad. 10. There is mild dilatation of the ascending aorta measuring 37 mm. 11. The inferior  vena cava is normal in size with greater than 50% respiratory variability, suggesting right atrial pressure of 3 mmHg. 12. TR signal is inadequate for assessing pulmonary artery systolic pressure.  FINDINGS Left Ventricle: Left ventricular ejection fraction, by visual estimation, is 60 to 65%. The left ventricle has normal function. The left ventricular internal cavity size was the left ventricle is normal in size. There is no left ventricular hypertrophy. Left ventricular diastolic parameters were normal.  Right Ventricle: The right ventricular size is normal. No increase in right ventricular wall thickness. Global RV systolic function is has normal systolic function.  Left Atrium: Left atrial size was normal in size.  Right Atrium: Right atrial size was normal in size  Pericardium: Trivial pericardial effusion is present. Presence of pericardial fat pad.  Mitral Valve: The mitral valve is normal in structure. No evidence of mitral valve regurgitation.  Tricuspid Valve: The tricuspid valve is normal in structure. Tricuspid valve regurgitation is not demonstrated.  Aortic Valve: The aortic valve is normal in structure. Aortic valve regurgitation is not visualized. The aortic valve is structurally normal, with no evidence of sclerosis or stenosis.  Pulmonic Valve: The pulmonic valve was not well visualized. Pulmonic valve regurgitation is not visualized.  Aorta: Aortic dilatation noted. There is mild dilatation of the ascending aorta measuring 37 mm.  Venous: The inferior vena cava is normal in size with greater than 50% respiratory variability, suggesting right atrial pressure of 3 mmHg.  IAS/Shunts: The interatrial septum was not well visualized.    LEFT VENTRICLE PLAX 2D LVIDd:         4.04 cm  Diastology LVIDs:         2.45 cm  LV e' lateral:   9.14 cm/s LV PW:         0.96 cm  LV E/e' lateral: 9.2 LV IVS:        0.78 cm  LV e' medial:    6.20 cm/s LVOT diam:     2.10 cm  LV  E/e' medial:  13.5 LV SV:         51 ml LV SV Index:   27.70 LVOT Area:     3.46 cm   RIGHT VENTRICLE RV S prime:     17.50 cm/s TAPSE (M-mode): 2.1 cm  LEFT ATRIUM             Index       RIGHT ATRIUM          Index LA diam:        3.70 cm 2.15 cm/m  RA Area:     9.27 cm LA Vol (A2C):   36.2 ml 21.07 ml/m RA Volume:   17.60 ml 10.25 ml/m LA Vol (A4C):   33.9 ml 19.73 ml/m LA Biplane Vol: 35.0 ml 20.37 ml/m AORTIC VALVE LVOT Vmax:   150.00 cm/s LVOT Vmean:  90.700 cm/s LVOT VTI:    0.258 m  AORTA Ao Root diam:  3.80 cm Ao Asc diam:  3.70 cm  MITRAL VALVE MV Area (PHT): cm                   SHUNTS MV PHT:        msec                  Systemic VTI:  0.26 m MV Decel Time: 232 msec              Systemic Diam: 2.10 cm MV E velocity: 83.90 cm/s  103 cm/s MV A velocity: 104.00 cm/s 70.3 cm/s MV E/A ratio:  0.81        1.5   Lonni Nanas MD Electronically signed by Lonni Nanas MD Signature Date/Time: 02/13/2019/10:12:23 PM    Final          ______________________________________________________________________________________________        Risk Assessment/Calculations:     HYPERTENSION CONTROL Vitals:   12/23/23 0819 12/23/23 0913  BP: (!) 150/84 (!) 150/82    The patient's blood pressure is elevated above target today.  In order to address the patient's elevated BP: Blood pressure will be monitored at home to determine if medication changes need to be made.          Physical Exam:   VS:  BP (!) 150/82 Comment: home BP  Pulse 81   Resp 17   Ht 4' 9 (1.448 m)   Wt 179 lb (81.2 kg)   SpO2 96%   BMI 38.74 kg/m    Wt Readings from Last 3 Encounters:  12/23/23 179 lb (81.2 kg)  08/18/23 167 lb 12.8 oz (76.1 kg)  08/09/23 167 lb 6.4 oz (75.9 kg)    GEN: Well nourished, well developed in no acute distress NECK: No JVD; No carotid bruits CARDIAC: RRR, no murmurs, rubs, gallops RESPIRATORY:  Clear to auscultation without rales,  wheezing or rhonchi  ABDOMEN: Soft, non-tender, non-distended EXTREMITIES:  No edema; No deformity   ASSESSMENT AND PLAN: .    Aortic atherosclerosis / Coronary calcification on CT / HLD / Bilateral carotid stenosis - Stable with no anginal symptoms. No indication for ischemic evaluationGDMT: Aspirin  81 mg, and rosuvastatin  40 mg. 07/2023 LDL 58. Encouraged seated exercise as limited by DOE due to lung disease.  HTN - BP not at goal in setting of being out of Amlodipine . Refill amlodipine  10 mg and olmesartan  40 mg.  Discussed to monitor BP at home at least 2 hours after medications and sitting for 5-10 minutes.   BMI 35 - Weight loss via diet and exercise encouraged. Discussed the impact being overweight would have on cardiovascular risk. Encouraged seated exercise on Youtube.       Dispo: Follow up in 1 year  Signed, Reche GORMAN Finder, NP

## 2023-12-24 ENCOUNTER — Other Ambulatory Visit: Payer: Self-pay | Admitting: Nurse Practitioner

## 2023-12-24 DIAGNOSIS — I700 Atherosclerosis of aorta: Secondary | ICD-10-CM

## 2023-12-24 DIAGNOSIS — E7800 Pure hypercholesterolemia, unspecified: Secondary | ICD-10-CM

## 2023-12-28 ENCOUNTER — Ambulatory Visit (INDEPENDENT_AMBULATORY_CARE_PROVIDER_SITE_OTHER)

## 2023-12-28 VITALS — Ht <= 58 in | Wt 179.0 lb

## 2023-12-28 DIAGNOSIS — Z0000 Encounter for general adult medical examination without abnormal findings: Secondary | ICD-10-CM

## 2023-12-28 NOTE — Patient Instructions (Signed)
 Ms. Mckeough,  Thank you for taking the time for your Medicare Wellness Visit. I appreciate your continued commitment to your health goals. Please review the care plan we discussed, and feel free to reach out if I can assist you further.  Medicare recommends these wellness visits once per year to help you and your care team stay ahead of potential health issues. These visits are designed to focus on prevention, allowing your provider to concentrate on managing your acute and chronic conditions during your regular appointments.  Please note that Annual Wellness Visits do not include a physical exam. Some assessments may be limited, especially if the visit was conducted virtually. If needed, we may recommend a separate in-person follow-up with your provider.  Ongoing Care Seeing your primary care provider every 3 to 6 months helps us  monitor your health and provide consistent, personalized care.   Referrals If a referral was made during today's visit and you haven't received any updates within two weeks, please contact the referred provider directly to check on the status.  Recommended Screenings:  Health Maintenance  Topic Date Due   Flu Shot  10/28/2023   COVID-19 Vaccine (8 - 2024-25 season) 11/28/2023   Screening for Lung Cancer  05/10/2024   Medicare Annual Wellness Visit  12/27/2024   Breast Cancer Screening  07/10/2025   DTaP/Tdap/Td vaccine (3 - Td or Tdap) 01/08/2031   Colon Cancer Screening  09/22/2031   Pneumococcal Vaccine for age over 66  Completed   DEXA scan (bone density measurement)  Completed   Hepatitis C Screening  Completed   Zoster (Shingles) Vaccine  Completed   HPV Vaccine  Aged Out   Meningitis B Vaccine  Aged Out   Hepatitis B Vaccine  Discontinued       12/28/2023    2:14 PM  Advanced Directives  Does Patient Have a Medical Advance Directive? No  Would patient like information on creating a medical advance directive? No - Patient declined   Advance Care  Planning is important because it: Ensures you receive medical care that aligns with your values, goals, and preferences. Provides guidance to your family and loved ones, reducing the emotional burden of decision-making during critical moments.  Vision: Annual vision screenings are recommended for early detection of glaucoma, cataracts, and diabetic retinopathy. These exams can also reveal signs of chronic conditions such as diabetes and high blood pressure.  Dental: Annual dental screenings help detect early signs of oral cancer, gum disease, and other conditions linked to overall health, including heart disease and diabetes.  Please see the attached documents for additional preventive care recommendations.

## 2023-12-28 NOTE — Progress Notes (Signed)
 Subjective:   Monica Scott is a 68 y.o. who presents for a Medicare Wellness preventive visit.  As a reminder, Annual Wellness Visits don't include a physical exam, and some assessments may be limited, especially if this visit is performed virtually. We may recommend an in-person follow-up visit with your provider if needed.  Visit Complete: Virtual I connected with  Monica Scott on 12/28/23 by a audio enabled telemedicine application and verified that I am speaking with the correct person using two identifiers.  Patient Location: Home  Provider Location: Office/Clinic  I discussed the limitations of evaluation and management by telemedicine. The patient expressed understanding and agreed to proceed.  Vital Signs: Because this visit was a virtual/telehealth visit, some criteria may be missing or patient reported. Any vitals not documented were not able to be obtained and vitals that have been documented are patient reported.  VideoDeclined- This patient declined Librarian, academic. Therefore the visit was completed with audio only.  Persons Participating in Visit: Patient.  AWV Questionnaire: No: Patient Medicare AWV questionnaire was not completed prior to this visit.  Cardiac Risk Factors include: advanced age (>59men, >66 women);dyslipidemia;hypertension;obesity (BMI >30kg/m2)     Objective:    Today's Vitals   12/28/23 1410  Weight: 179 lb (81.2 kg)  Height: 4' 9 (1.448 m)   Body mass index is 38.74 kg/m.     12/28/2023    2:14 PM 09/21/2022    6:13 PM 08/17/2022    8:21 AM 03/08/2022   10:07 AM 01/21/2022    5:14 PM 09/21/2021   12:13 PM 08/12/2021   10:29 AM  Advanced Directives  Does Patient Have a Medical Advance Directive? No No No No No No No  Would patient like information on creating a medical advance directive? No - Patient declined  No - Patient declined Yes (ED - Information included in AVS)  No - Patient declined No -  Patient declined    Current Medications (verified) Outpatient Encounter Medications as of 12/28/2023  Medication Sig   acetaminophen  (TYLENOL ) 325 MG tablet Take 650 mg by mouth every 6 (six) hours as needed for mild pain.   albuterol  (VENTOLIN  HFA) 108 (90 Base) MCG/ACT inhaler INHALE 1-2 PUFFS EVERY 6 HOURS AS NEEDED FOR WHEEZE OR SHORTNESS OF BREATH **NEED APPOINTMENT   alendronate  (FOSAMAX ) 70 MG tablet Take 70 mg by mouth once a week.   amLODipine  (NORVASC ) 10 MG tablet Take 1 tablet (10 mg total) by mouth daily.   aspirin  EC 81 MG tablet Take 1 tablet (81 mg total) by mouth daily. Swallow whole.   buPROPion  (WELLBUTRIN  XL) 150 MG 24 hr tablet Take 1 tablet (150 mg total) by mouth at bedtime.   busPIRone (BUSPAR) 5 MG tablet Take 5 mg by mouth 2 (two) times daily.   Ensifentrine  (OHTUVAYRE ) 3 MG/2.5ML SUSP Inhale 1 each into the lungs in the morning and at bedtime.   estradiol  (ESTRACE ) 0.1 MG/GM vaginal cream PLACE 0.5 G VAGINALLY 2 (TWO) TIMES A WEEK. PLACE 0.5G NIGHTLY FOR TWO WEEKS THEN TWICE A WEEK AFTER   estradiol  (ESTRING ) 7.5 MCG/24HR vaginal ring Place 1 each vaginally every 3 (three) months.   FLUoxetine  (PROZAC ) 20 MG capsule Take 60 mg by mouth every morning.   gabapentin  (NEURONTIN ) 400 MG capsule Take 400 mg by mouth 3 (three) times daily.   ibuprofen  (ADVIL ,MOTRIN ) 200 MG tablet Take 400 mg by mouth daily as needed for moderate pain.   olmesartan  (BENICAR ) 40 MG tablet  Take 1 tablet (40 mg total) by mouth daily.   omeprazole  (PRILOSEC) 40 MG capsule TAKE 1 CAPSULE (40 MG TOTAL) BY MOUTH IN THE MORNING AND AT BEDTIME.   potassium chloride  SA (KLOR-CON  M20) 20 MEQ tablet Take 1 tablet (20 mEq total) by mouth 2 (two) times daily.   rosuvastatin  (CRESTOR ) 40 MG tablet Take 1 tablet (40 mg total) by mouth daily. For cholesterol   TRELEGY ELLIPTA  200-62.5-25 MCG/ACT AEPB TAKE 1 PUFF BY MOUTH EVERY DAY   [DISCONTINUED] budesonide -formoterol  (SYMBICORT ) 160-4.5 MCG/ACT inhaler  Inhale 2 puffs into the lungs.   [DISCONTINUED] methocarbamol  (ROBAXIN ) 500 MG tablet TAKE 1 TABLET BY MOUTH 2 TIMES DAILY AS NEEDED FOR MUSCLE SPASMS (NOT COVERED BY INS.)   [DISCONTINUED] OLANZapine  (ZYPREXA ) 10 MG tablet Take 10 mg by mouth at bedtime.    No facility-administered encounter medications on file as of 12/28/2023.    Allergies (verified) Tizanidine    History: Past Medical History:  Diagnosis Date   Arthritis    Asthma    Bladder disease    COPD (chronic obstructive pulmonary disease) (HCC)    Depression    GERD (gastroesophageal reflux disease)    Hyperlipidemia    Hypertension    Peptic ulcer    Urinary retention    Past Surgical History:  Procedure Laterality Date   BIOPSY  04/24/2018   Procedure: BIOPSY;  Surgeon: Shaaron Lamar HERO, MD;  [facility];  Service: Endoscopy;;  esophagus   COLONOSCOPY WITH PROPOFOL  N/A 04/24/2018   Procedure: COLONOSCOPY WITH PROPOFOL ;  Surgeon: Shaaron Lamar HERO, MD;  [facility];  Service: Endoscopy;  Laterality: N/A;  9:00am   COLONOSCOPY WITH PROPOFOL  N/A 09/21/2021   Procedure: COLONOSCOPY WITH PROPOFOL ;  Surgeon: Shaaron Lamar HERO, MD;  [facility];  Service: Endoscopy;  Laterality: N/A;  2:15 / ASA 2   ESOPHAGOGASTRODUODENOSCOPY (EGD) WITH PROPOFOL  N/A 04/24/2018   Procedure: ESOPHAGOGASTRODUODENOSCOPY (EGD) WITH PROPOFOL ;  Surgeon: Shaaron Lamar HERO, MD;  [facility];  Service: Endoscopy;  Laterality: N/A;   MALONEY DILATION N/A 04/24/2018   Procedure: AGAPITO DILATION;  Surgeon: Shaaron Lamar HERO, MD;  [facility];  Service: Endoscopy;  Laterality: N/A;   PILONIDAL CYST EXCISION     age 45   POLYPECTOMY  04/24/2018   Procedure: POLYPECTOMY;  Surgeon: Shaaron Lamar HERO, MD;  [facility];  Service: Endoscopy;;  colon    POLYPECTOMY  09/21/2021   Procedure: POLYPECTOMY;  Surgeon: Shaaron Lamar HERO, MD;  [facility];  Service: Endoscopy;;   VAGINAL  HYSTERECTOMY     Family History  Problem Relation Age of Onset   Alcohol abuse Mother    Arthritis Mother    Cancer Mother    Depression Mother    Early death Mother    Alcohol abuse Father    Depression Father    Alcohol abuse Sister    Depression Sister    Alcohol abuse Daughter    Depression Daughter    COPD Maternal Grandfather    Heart disease Maternal Grandfather    Hypertension Maternal Grandfather    Alcohol abuse Brother    Colon cancer Brother        deceased at age 54   Alcohol abuse Son    Breast cancer Neg Hx    Social History   Socioeconomic History   Marital status: Single    Spouse name: Not on file   Number of children: 4   Years of  education: Not on file   Highest education level: Not on file  Occupational History   Not on file  Tobacco Use   Smoking status: Former    Current packs/day: 0.00    Average packs/day: 1.5 packs/day for 40.0 years (60.0 ttl pk-yrs)    Types: Cigarettes    Start date: 04/19/1970    Quit date: 04/19/2010    Years since quitting: 13.7    Passive exposure: Never   Smokeless tobacco: Never  Vaping Use   Vaping status: Never Used  Substance and Sexual Activity   Alcohol use: No    Comment: Former ETOH. none since 2000   Drug use: No   Sexual activity: Not Currently    Birth control/protection: Surgical  Other Topics Concern   Not on file  Social History Narrative   Not on file   Social Drivers of Health   Financial Resource Strain: Low Risk  (12/28/2023)   Overall Financial Resource Strain (CARDIA)    Difficulty of Paying Living Expenses: Not hard at all  Food Insecurity: No Food Insecurity (12/28/2023)   Hunger Vital Sign    Worried About Running Out of Food in the Last Year: Never true    Ran Out of Food in the Last Year: Never true  Transportation Needs: No Transportation Needs (12/28/2023)   PRAPARE - Administrator, Civil Service (Medical): No    Lack of Transportation (Non-Medical): No  Physical  Activity: Inactive (12/28/2023)   Exercise Vital Sign    Days of Exercise per Week: 0 days    Minutes of Exercise per Session: 0 min  Stress: No Stress Concern Present (12/28/2023)   [employer] of Occupational Health - Occupational Stress Questionnaire    Feeling of Stress: Not at all  Social Connections: Socially Isolated (12/28/2023)   Social Connection and Isolation Panel    Frequency of Communication with Friends and Family: More than three times a week    Frequency of Social Gatherings with Friends and Family: Once a week    Attends Religious Services: Never    Database administrator or Organizations: No    Attends Engineer, structural: Never    Marital Status: Never married    Tobacco Counseling Counseling given: Not Answered    Clinical Intake:  Pre-visit preparation completed: Yes  Pain : No/denies pain     BMI - recorded: 38.74 Nutritional Status: BMI > 30  Obese Nutritional Risks: None Diabetes: No  Lab Results  Component Value Date   HGBA1C 6.3 08/09/2023   HGBA1C 6.0 (H) 10/13/2022   HGBA1C 5.8 (H) 07/13/2022     How often do you need to have someone help you when you read instructions, pamphlets, or other written materials from your doctor or pharmacy?: 1 - Never  Interpreter Needed?: No  Information entered by :: Ellouise Haws, LPN   Activities of Daily Living     12/28/2023    2:12 PM  In your present state of health, do you have any difficulty performing the following activities:  Hearing? 0  Vision? 0  Difficulty concentrating or making decisions? 0  Walking or climbing stairs? 0  Dressing or bathing? 0  Doing errands, shopping? 0  Preparing Food and eating ? N  Using the Toilet? N  In the past six months, have you accidently leaked urine? N  Do you have problems with loss of bowel control? N  Managing your Medications? N  Managing your Finances? N  Housekeeping  or managing your Housekeeping? N    Patient Care  Team: Allwardt, Alyssa M, PA-C as PCP - General (Physician Assistant) Lonni Slain, MD as PCP - Cardiology (Cardiology) Shaaron Lamar HERO, MD as Consulting Physician (Gastroenterology)  I have updated your Care Teams any recent Medical Services you may have received from other providers in the past year.     Assessment:   This is a routine wellness examination for Monica Scott.  Hearing/Vision screen Hearing Screening - Comments:: Pt denies any hearing issues  Vision Screening - Comments:: Wears rx glasses - up to date with routine eye exams with [facility]    Goals Addressed   None    Depression Screen     12/28/2023    2:13 PM 08/09/2023   12:03 PM 08/09/2023   11:31 AM 02/21/2023    4:15 PM 10/13/2022    9:09 AM 07/13/2022    9:03 AM 04/13/2022   10:02 AM  PHQ 2/9 Scores  PHQ - 2 Score 0 1 0 0  1 1  PHQ- 9 Score  10  0  2 4  Exception Documentation     Patient refusal      Fall Risk     12/28/2023    2:14 PM 08/09/2023   11:31 AM 02/21/2023    9:27 AM 10/13/2022    9:09 AM 07/13/2022    9:03 AM  Fall Risk   Falls in the past year? 0 0 0 0 0  Number falls in past yr: 0 0  0 0  Injury with Fall? 0 0  0 0  Risk for fall due to : Impaired balance/gait;Impaired mobility;History of fall(s) No Fall Risks  No Fall Risks No Fall Risks  Follow up Falls prevention discussed Falls evaluation completed  Falls evaluation completed Falls evaluation completed    MEDICARE RISK AT HOME:  Medicare Risk at Home Any stairs in or around the home?: No If so, are there any without handrails?: No Home free of loose throw rugs in walkways, pet beds, electrical cords, etc?: Yes Adequate lighting in your home to reduce risk of falls?: Yes Life alert?: No Use of a cane, walker or w/c?: Yes Grab bars in the bathroom?: No Shower chair or bench in shower?: No Elevated toilet seat or a handicapped toilet?: No  TIMED UP AND GO:  Was the test performed?  No  Cognitive Function: 6CIT  completed    03/08/2022   10:07 AM 01/07/2021    4:23 PM  MMSE - Mini Mental State Exam  Orientation to time 5 5  Orientation to Place 5 5  Registration 3 3  Attention/ Calculation 5 5  Recall 3 3  Language- name 2 objects 2 2  Language- repeat 1 1  Language- follow 3 step command 3 3  Language- read & follow direction 1 1  Write a sentence 1 1  Copy design 1 0  Total score 30 29        12/28/2023    2:15 PM 03/08/2022   10:08 AM  6CIT Screen  What Year? 0 points 0 points  What month? 0 points 0 points  What time? 0 points 0 points  Count back from 20 0 points 0 points  Months in reverse 0 points   Repeat phrase 0 points 0 points  Total Score 0 points     Immunizations Immunization History  Administered Date(s) Administered   Fluad Quad(high Dose 65+) 02/06/2021   Fluad Trivalent(High Dose 65+) 12/22/2022  Hepatitis B, ADULT 07/17/2016   INFLUENZA, HIGH DOSE SEASONAL PF 12/13/2022   Influenza,inj,Quad PF,6+ Mos 12/06/2017, 11/28/2018, 01/03/2020, 01/27/2022   Moderna Covid-19 Fall Seasonal Vaccine 69yrs & older 01/08/2022   PFIZER Comirnaty(Gray Top)Covid-19 Tri-Sucrose Vaccine 07/04/2020   PFIZER(Purple Top)SARS-COV-2 Vaccination 06/24/2019, 07/08/2019, 03/07/2020   PNEUMOCOCCAL CONJUGATE-20 08/06/2020, 03/09/2021, 01/21/2022   Pfizer Covid-19 Vaccine Bivalent Booster 78yrs & up 04/02/2021   Pfizer(Comirnaty)Fall Seasonal Vaccine 12 years and older 12/22/2022   Pneumococcal Polysaccharide-23 01/02/2015   Respiratory Syncytial Virus Vaccine ,Recomb Aduvanted(Arexvy ) 05/02/2023   Tdap 11/26/2013, 01/07/2021   Zoster Recombinant(Shingrix ) 07/17/2016, 08/06/2020, 09/07/2021, 04/13/2022    Screening Tests Health Maintenance  Topic Date Due   Influenza Vaccine  10/28/2023   COVID-19 Vaccine (8 - 2024-25 season) 11/28/2023   Lung Cancer Screening  05/10/2024   Medicare Annual Wellness (AWV)  12/27/2024   Mammogram  07/10/2025   DTaP/Tdap/Td (3 - Td or Tdap)  01/08/2031   Colonoscopy  09/22/2031   Pneumococcal Vaccine: 50+ Years  Completed   DEXA SCAN  Completed   Hepatitis C Screening  Completed   Zoster Vaccines- Shingrix   Completed   HPV VACCINES  Aged Out   Meningococcal B Vaccine  Aged Out   Hepatitis B Vaccines 19-59 Average Risk  Discontinued    Health Maintenance Items Addressed: See Nurse Notes at the end of this note  Additional Screening:  Vision Screening: Recommended annual ophthalmology exams for early detection of glaucoma and other disorders of the eye. Is the patient up to date with their annual eye exam?  Yes  Who is the provider or what is the name of the office in which the patient attends annual eye exams? [facility]   Dental Screening: Recommended annual dental exams for proper oral hygiene  Community Resource Referral / Chronic Care Management: CRR required this visit?  No   CCM required this visit?  No   Plan:    I have personally reviewed and noted the following in the patient's chart:   Medical and social history Use of alcohol, tobacco or illicit drugs  Current medications and supplements including opioid prescriptions. Patient is not currently taking opioid prescriptions. Functional ability and status Nutritional status Physical activity Advanced directives List of other physicians Hospitalizations, surgeries, and ER visits in previous 12 months Vitals Screenings to include cognitive, depression, and falls Referrals and appointments  In addition, I have reviewed and discussed with patient certain preventive protocols, quality metrics, and best practice recommendations. A written personalized care plan for preventive services as well as general preventive health recommendations were provided to patient.   Ellouise VEAR Haws, LPN   [id number]   After Visit Summary: (Declined) Due to this being a telephonic visit, with patients personalized plan was offered to patient but patient Declined AVS at this  time   Notes: Nothing significant to report at this time.

## 2024-01-11 DIAGNOSIS — M1610 Unilateral primary osteoarthritis, unspecified hip: Secondary | ICD-10-CM | POA: Insufficient documentation

## 2024-01-11 DIAGNOSIS — M4310 Spondylolisthesis, site unspecified: Secondary | ICD-10-CM | POA: Insufficient documentation

## 2024-01-12 ENCOUNTER — Ambulatory Visit: Admitting: Physician Assistant

## 2024-01-12 VITALS — BP 122/68 | HR 80 | Temp 98.2°F | Ht <= 58 in | Wt 180.4 lb

## 2024-01-12 DIAGNOSIS — J439 Emphysema, unspecified: Secondary | ICD-10-CM | POA: Diagnosis not present

## 2024-01-12 DIAGNOSIS — R7303 Prediabetes: Secondary | ICD-10-CM

## 2024-01-12 DIAGNOSIS — J9611 Chronic respiratory failure with hypoxia: Secondary | ICD-10-CM

## 2024-01-12 DIAGNOSIS — I10 Essential (primary) hypertension: Secondary | ICD-10-CM

## 2024-01-12 DIAGNOSIS — J438 Other emphysema: Secondary | ICD-10-CM | POA: Diagnosis not present

## 2024-01-12 DIAGNOSIS — E876 Hypokalemia: Secondary | ICD-10-CM | POA: Diagnosis not present

## 2024-01-12 DIAGNOSIS — Z6839 Body mass index (BMI) 39.0-39.9, adult: Secondary | ICD-10-CM

## 2024-01-12 DIAGNOSIS — E782 Mixed hyperlipidemia: Secondary | ICD-10-CM | POA: Diagnosis not present

## 2024-01-12 LAB — POCT GLYCOSYLATED HEMOGLOBIN (HGB A1C): Hemoglobin A1C: 5.9 % — AB (ref 4.0–5.6)

## 2024-01-12 MED ORDER — POTASSIUM CHLORIDE CRYS ER 20 MEQ PO TBCR: 20.0000 meq | EXTENDED_RELEASE_TABLET | Freq: Two times a day (BID) | ORAL | 1 refills | Status: AC

## 2024-01-12 MED ORDER — METFORMIN HCL ER 500 MG PO TB24: 500.0000 mg | ORAL_TABLET | Freq: Every day | ORAL | 2 refills | Status: DC

## 2024-01-12 NOTE — Progress Notes (Addendum)
 Patient ID: Monica Scott, female    DOB: [birth year], 68 y.o.   MRN: [id number]   Assessment & Plan:  Mixed hyperlipidemia -     Amb ref to Medical Nutrition Therapy-MNT  Hypokalemia -     Potassium Chloride  Crys ER; Take 1 tablet (20 mEq total) by mouth 2 (two) times daily.  Dispense: 180 tablet; Refill: 1  Prediabetes -     POCT glycosylated hemoglobin (Hb A1C) -     metFORMIN HCl ER; Take 1 tablet (500 mg total) by mouth daily with breakfast.  Dispense: 30 tablet; Refill: 2 -     Amb ref to Medical Nutrition Therapy-MNT  Morbid obesity (HCC) -     metFORMIN HCl ER; Take 1 tablet (500 mg total) by mouth daily with breakfast.  Dispense: 30 tablet; Refill: 2 -     Amb ref to Medical Nutrition Therapy-MNT  Other emphysema (HCC)  Chronic respiratory failure with hypoxia (HCC)  Hypertension, essential, benign -     Amb ref to Medical Nutrition Therapy-MNT      Assessment & Plan Chronic obstructive pulmonary disease (COPD) and chronic respiratory failure with hypoxia COPD with chronic respiratory failure and hypoxia. Humid weather exacerbates symptoms. - Continue follow-up with pulmonologist every six months - Continue use of portable oxygen  concentrator  Morbid obesity BMI is 39.04, indicating morbid obesity. Weight gain attributed to lifestyle changes including cessation of smoking and increased soda consumption. Discussed potential benefits of weight loss on breathing and overall health. - Refer to nutritionist for dietary counseling - Encourage reduction in soda consumption - Encourage small dietary changes to aid weight loss  Prediabetes A1c is 5.9, indicating prediabetes. Previous A1c was 6.3. Discussed potential benefits of metformin in improving insulin sensitivity and aiding weight loss. Discussed common side effects of metformin, including gastrointestinal upset. Emphasized the importance of dietary changes and weight loss in managing prediabetes. 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 Potassium levels have been stable with supplementation. Reports feeling clumsy without potassium supplementation. - Continue potassium supplementation daily  Hyperlipidemia - Continue Rosuvastatin  40 mg daily, refer to nutritionist  Lab Results  Component Value Date   CHOL 172 08/09/2023   HDL 59.30 08/09/2023   LDLCALC 58 08/09/2023   TRIG 270.0 (H) 08/09/2023   CHOLHDL 3 08/09/2023         Return in about 4 months (around 05/14/2024) for recheck/follow-up.    Subjective:    Chief Complaint  Patient presents with   Medical Management of Chronic Issues    Pt in office for 4-6 mon f/u pt advising to recheck lipid panel, while it was abnormal at last check no indications of rechecking at today's visit; pt is not fasting had milk in coffee and pt states last Lipid check she was not fasting.    Medication Refill    Pt requesting refill of Klor-Con  20;     Medication Refill   Discussed the use of AI scribe software for clinical note transcription with the patient, who gave verbal consent to proceed.  History of Present Illness Monica Scott is a 68 year old female with COPD and chronic hypokalemia who presents for a regular follow-up visit.  She experiences exacerbations of her COPD symptoms during hot and humid weather. She follows up with a pulmonologist every six months.  She has chronic hypokalemia and takes potassium  supplements daily. She feels clumsy if she misses her potassium for a couple of weeks. She consumes six to eight cans of Pepsi daily, which may affect her potassium levels.  She is prediabetic with a previous A1c of 6.3, which has decreased to 5.9. She has not been diagnosed with diabetes and has not taken metformin before. She acknowledges the need for dietary changes and has discussed  with her son about finding healthier recipes.  She has a history of morbid obesity with a BMI of 39.04. She quit smoking and drinking, which she attributes to her weight gain. She finds it difficult to get around and mentions that her son is concerned about her weight. She is 4'9 and states this is the heaviest she has been.  Her blood pressure is currently stable on amlodipine  10 mg and Benicar  40 mg. She takes bupropion  for depression, prescribed by her psychiatrist, Velinda Bloch. She does not currently engage in counseling but acknowledges it might be beneficial.  No swelling in the legs. No significant health changes since the last visit.     Past Medical History:  Diagnosis Date   Arthritis    Asthma    Bladder disease    COPD (chronic obstructive pulmonary disease) (HCC)    Depression    GERD (gastroesophageal reflux disease)    Hyperlipidemia    Hypertension    Peptic ulcer    Urinary retention     Past Surgical History:  Procedure Laterality Date   BIOPSY  04/24/2018   Procedure: BIOPSY;  Surgeon: Shaaron Lamar HERO, MD;  [facility];  Service: Endoscopy;;  esophagus   COLONOSCOPY WITH PROPOFOL  N/A 04/24/2018   Procedure: COLONOSCOPY WITH PROPOFOL ;  Surgeon: Shaaron Lamar HERO, MD;  [facility];  Service: Endoscopy;  Laterality: N/A;  9:00am   COLONOSCOPY WITH PROPOFOL  N/A 09/21/2021   Procedure: COLONOSCOPY WITH PROPOFOL ;  Surgeon: Shaaron Lamar HERO, MD;  [facility];  Service: Endoscopy;  Laterality: N/A;  2:15 / ASA 2   ESOPHAGOGASTRODUODENOSCOPY (EGD) WITH PROPOFOL  N/A 04/24/2018   Procedure: ESOPHAGOGASTRODUODENOSCOPY (EGD) WITH PROPOFOL ;  Surgeon: Shaaron Lamar HERO, MD;  [facility];  Service: Endoscopy;  Laterality: N/A;   MALONEY DILATION N/A 04/24/2018   Procedure: AGAPITO DILATION;  Surgeon: Shaaron Lamar HERO, MD;  [facility];  Service: Endoscopy;  Laterality: N/A;   PILONIDAL CYST EXCISION     age 44    POLYPECTOMY  04/24/2018   Procedure: POLYPECTOMY;  Surgeon: Shaaron Lamar HERO, MD;  [facility];  Service: Endoscopy;;  colon    POLYPECTOMY  09/21/2021   Procedure: POLYPECTOMY;  Surgeon: Shaaron Lamar HERO, MD;  [facility];  Service: Endoscopy;;   VAGINAL HYSTERECTOMY      Family History  Problem Relation Age of Onset   Alcohol abuse Mother    Arthritis Mother    Cancer Mother    Depression Mother    Early death Mother    Alcohol abuse Father    Depression Father    Alcohol abuse Sister    Depression Sister    Alcohol abuse Daughter    Depression Daughter    COPD Maternal Grandfather    Heart disease Maternal Grandfather    Hypertension Maternal Grandfather    Alcohol abuse Brother    Colon cancer Brother        deceased at age 77   Alcohol abuse Son    Breast cancer Neg Hx  Social History   Tobacco Use   Smoking status: Former    Current packs/day: 0.00    Average packs/day: 1.5 packs/day for 40.0 years (60.0 ttl pk-yrs)    Types: Cigarettes    Start date: 04/19/1970    Quit date: 04/19/2010    Years since quitting: 13.7    Passive exposure: Never   Smokeless tobacco: Never  Vaping Use   Vaping status: Never Used  Substance Use Topics   Alcohol use: No    Comment: Former ETOH. none since 2000   Drug use: No     Allergies  Allergen Reactions   Tizanidine  Other (See Comments)    Caused dizziness and dry mouth. Patient prefers not to take this again.    Review of Systems NEGATIVE UNLESS OTHERWISE INDICATED IN HPI      Objective:     BP 122/68 (BP Location: Left Arm, Patient Position: Sitting, Cuff Size: Large)   Pulse 80   Temp 98.2 F (36.8 C) (Temporal)   Ht 4' 9 (1.448 m)   Wt 180 lb 6.4 oz (81.8 kg)   SpO2 95%   BMI 39.04 kg/m   Wt Readings from Last 3 Encounters:  01/12/24 180 lb 6.4 oz (81.8 kg)  12/28/23 179 lb (81.2 kg)  12/23/23 179 lb (81.2 kg)    BP Readings from Last 3 Encounters:  01/12/24 122/68   12/23/23 (!) 150/82  11/03/23 126/79     Physical Exam Vitals and nursing note reviewed.  Constitutional:      Appearance: Normal appearance. She is obese.  Eyes:     Extraocular Movements: Extraocular movements intact.     Conjunctiva/sclera: Conjunctivae normal.     Pupils: Pupils are equal, round, and reactive to light.  Cardiovascular:     Rate and Rhythm: Normal rate and regular rhythm.     Heart sounds: Normal heart sounds.  Pulmonary:     Breath sounds: Decreased breath sounds present.     Comments: Portable oxygen  Musculoskeletal:     Right lower leg: No edema.     Left lower leg: No edema.  Neurological:     General: No focal deficit present.     Mental Status: She is alert and oriented to person, place, and time.  Psychiatric:        Mood and Affect: Mood normal.             Ariely Riddell M Olivene Cookston, PA-C

## 2024-01-12 NOTE — Patient Instructions (Signed)
  VISIT SUMMARY: Today, we reviewed your COPD, chronic hypokalemia, prediabetes, and morbid obesity. We discussed your current treatments and made some adjustments to help manage your conditions better.  YOUR PLAN: CHRONIC OBSTRUCTIVE PULMONARY DISEASE (COPD) AND CHRONIC RESPIRATORY FAILURE WITH HYPOXIA: Your COPD symptoms worsen in humid weather, and you have chronic respiratory failure with low oxygen  levels. -Continue following up with your pulmonologist every six months. -Keep using your portable oxygen  concentrator.  MORBID OBESITY: Your BMI is 39.04, which is considered morbidly obese. Your weight gain is partly due to lifestyle changes like quitting smoking and drinking more soda. -We will refer you to a nutritionist for dietary counseling. -Try to reduce your soda consumption. -Make small dietary changes to help with weight loss.  PREDIABETES: Your A1c level is 5.9, which indicates prediabetes. It has improved from 6.3. We discussed the benefits and side effects of metformin. -Start taking metformin with your evening meal. -If you tolerate it well, increase the dose to twice daily after a few weeks.  CHRONIC HYPOKALEMIA: You have low potassium levels that are stable with daily supplements. You feel clumsy if you miss your potassium. -Continue taking your potassium supplements daily.                      Contains text generated by Abridge.                                 Contains text generated by Abridge.

## 2024-01-30 DIAGNOSIS — J449 Chronic obstructive pulmonary disease, unspecified: Secondary | ICD-10-CM | POA: Diagnosis not present

## 2024-02-03 ENCOUNTER — Ambulatory Visit: Admitting: Obstetrics and Gynecology

## 2024-02-03 ENCOUNTER — Other Ambulatory Visit (HOSPITAL_COMMUNITY)
Admission: RE | Admit: 2024-02-03 | Discharge: 2024-02-03 | Disposition: A | Discharge status: Home / Self Care | Source: Ambulatory Visit | Attending: Obstetrics and Gynecology | Admitting: Obstetrics and Gynecology

## 2024-02-03 ENCOUNTER — Encounter: Payer: Self-pay | Admitting: Obstetrics and Gynecology

## 2024-02-03 VITALS — BP 109/70 | HR 87 | Ht <= 58 in | Wt 180.0 lb

## 2024-02-03 DIAGNOSIS — N813 Complete uterovaginal prolapse: Secondary | ICD-10-CM

## 2024-02-03 DIAGNOSIS — N993 Prolapse of vaginal vault after hysterectomy: Secondary | ICD-10-CM

## 2024-02-03 DIAGNOSIS — N952 Postmenopausal atrophic vaginitis: Secondary | ICD-10-CM

## 2024-02-03 DIAGNOSIS — N898 Other specified noninflammatory disorders of vagina: Secondary | ICD-10-CM | POA: Diagnosis present

## 2024-02-03 DIAGNOSIS — N8110 Cystocele, unspecified: Secondary | ICD-10-CM

## 2024-02-03 DIAGNOSIS — Z960 Presence of urogenital implants: Secondary | ICD-10-CM

## 2024-02-03 MED ORDER — ESTRADIOL 7.5 MCG/24HR VA RING: 1.0000 | VAGINAL_RING | VAGINAL | 3 refills | Status: AC

## 2024-02-03 MED ORDER — METRONIDAZOLE 500 MG PO TABS: 500.0000 mg | ORAL_TABLET | Freq: Two times a day (BID) | ORAL | 0 refills | Status: AC

## 2024-02-03 NOTE — Patient Instructions (Signed)
 Please bring E-string to your next appointment so we can insert it in with the pessary.   Use your estrogen cream nightly for the next two weeks then drop back down to twice a week.   Take the flagyl  for presumed BV. Swab sent to lab.

## 2024-02-03 NOTE — Progress Notes (Signed)
 [facility]   Subjective:     Chief Complaint:  Chief Complaint  Patient presents with   Pessary Check    Monica Scott is a 68 y.o. female here for pessary check    History of Present Illness: Monica Scott is a 68 y.o. female with stage III pelvic organ prolapse who presents for a pessary check. She is using a size #3 ring with support pessary. The pessary has been working well and she has no complaints. She is using vaginal estrogen cream and has tried to be more consistent. She endorses some minimal vaginal bleeding and some discharge with an odor.   Past Medical History: Patient  has a past medical history of Arthritis, Asthma, Bladder disease, COPD (chronic obstructive pulmonary disease) (HCC), Depression, GERD (gastroesophageal reflux disease), Hyperlipidemia, Hypertension, Peptic ulcer, and Urinary retention.   Past Surgical History: She  has a past surgical history that includes Vaginal hysterectomy; Pilonidal cyst excision; Colonoscopy with propofol  (N/A, 04/24/2018); Esophagogastroduodenoscopy (egd) with propofol  (N/A, 04/24/2018); maloney dilation (N/A, 04/24/2018); biopsy (04/24/2018); polypectomy (04/24/2018); Colonoscopy with propofol  (N/A, 09/21/2021); and polypectomy (09/21/2021).   Medications: She has a current medication list which includes the following prescription(s): acetaminophen , albuterol , alendronate , amlodipine , aspirin  ec, bupropion , buspirone, estradiol , estradiol , fluoxetine , gabapentin , ibuprofen , metformin, metronidazole , olmesartan , omeprazole , potassium chloride  sa, rosuvastatin , and trelegy ellipta .   Allergies: Patient is allergic to tizanidine .   Social History: Patient  reports that she quit smoking about 13 years ago. Her smoking use included cigarettes. She started smoking about 53 years ago. She has a 60 pack-year smoking history. She has never been exposed to tobacco smoke. She has never used smokeless tobacco. She reports  that she does not drink alcohol and does not use drugs.      Objective:    Physical Exam: BP 109/70   Pulse 87   Ht 4' 9 (1.448 m)   Wt 180 lb (81.6 kg)   BMI 38.95 kg/m  Gen: No apparent distress, A&O x 3. Detailed Urogynecologic Evaluation:  Pelvic Exam: Normal external female genitalia; Bartholin's and Skene's glands normal in appearance; urethral meatus normal in appearance, no urethral masses or discharge. The pessary was noted to be in place. It was removed and cleaned. Speculum exam revealed mild irritation at the apex that was not actively bleeding. Small amount of vaginal discharge noted, aptima swab obtained. Estrogen cream placed on the back of pessary. The pessary was replaced. It was comfortable to the patient and fit well.    Assessment/Plan:    Assessment: Monica Scott is a 68 y.o. with stage III pelvic organ prolapse here for a pessary check. She is doing well.  Plan: She will keep the pessary in place until next visit. She will continue to use estrogen, encouraged her to use this nightly for the next two weeks. She will follow-up in 3 months for a pessary check or sooner as needed. Would like her to get an E-string as this is estimated to be covered by insurance and this would help prevent vaginal erosion/irritation related to pessary use. Patient reports understanding.   Discharge and odor appear to be BV. Will send aptima swab and flagyl  sent to the pharmacy to treat BV. Patient to call with concerns or if her symptoms are not improving/worsening.   All questions were answered.

## 2024-02-06 ENCOUNTER — Ambulatory Visit: Payer: Self-pay | Admitting: Obstetrics and Gynecology

## 2024-02-06 DIAGNOSIS — B3731 Acute candidiasis of vulva and vagina: Secondary | ICD-10-CM

## 2024-02-06 LAB — CERVICOVAGINAL ANCILLARY ONLY
Bacterial Vaginitis (gardnerella): NEGATIVE
Candida Glabrata: NEGATIVE
Candida Vaginitis: POSITIVE — AB
Comment: NEGATIVE
Comment: NEGATIVE
Comment: NEGATIVE

## 2024-02-06 MED ORDER — FLUCONAZOLE 150 MG PO TABS: 150.0000 mg | ORAL_TABLET | Freq: Once | ORAL | 0 refills | Status: AC

## 2024-02-14 ENCOUNTER — Other Ambulatory Visit: Payer: Self-pay | Admitting: Obstetrics and Gynecology

## 2024-02-14 ENCOUNTER — Other Ambulatory Visit: Payer: Self-pay | Admitting: Adult Health

## 2024-02-14 DIAGNOSIS — J449 Chronic obstructive pulmonary disease, unspecified: Secondary | ICD-10-CM

## 2024-02-14 DIAGNOSIS — N952 Postmenopausal atrophic vaginitis: Secondary | ICD-10-CM

## 2024-02-15 NOTE — Telephone Encounter (Signed)
 Pt requesting refill of specialty medication - routing to Rx team to advise.

## 2024-02-15 NOTE — Telephone Encounter (Signed)
 Refill sent for OHTUVAYRE  to [facility]  Dose: 3mg /2.72mL nebulizer solution - Use 3mg  by nebulizer twice daily   Last OV: 08/18/23 Provider: Dr. Jude  Next OV: 02/16/24  Aleck Puls, PharmD, BCPS Clinical Pharmacist  [facility]

## 2024-02-16 ENCOUNTER — Encounter (HOSPITAL_BASED_OUTPATIENT_CLINIC_OR_DEPARTMENT_OTHER): Payer: Self-pay | Admitting: Pulmonary Disease

## 2024-02-16 ENCOUNTER — Ambulatory Visit (INDEPENDENT_AMBULATORY_CARE_PROVIDER_SITE_OTHER): Admitting: Pulmonary Disease

## 2024-02-16 VITALS — BP 100/60 | HR 97 | Temp 99.0°F | Ht <= 58 in | Wt 181.2 lb

## 2024-02-16 DIAGNOSIS — I10 Essential (primary) hypertension: Secondary | ICD-10-CM | POA: Diagnosis not present

## 2024-02-16 DIAGNOSIS — J4489 Other specified chronic obstructive pulmonary disease: Secondary | ICD-10-CM | POA: Diagnosis not present

## 2024-02-16 DIAGNOSIS — J439 Emphysema, unspecified: Secondary | ICD-10-CM

## 2024-02-16 DIAGNOSIS — J9611 Chronic respiratory failure with hypoxia: Secondary | ICD-10-CM

## 2024-02-16 NOTE — Progress Notes (Signed)
 Subjective:    Patient ID: Monica Scott, female    DOB: April 08, 1955, 68 y.o.   MRN: [id number]   68 yo ex-smoker  followed for COPD and chronic hypoxic respiratory failure on oxygen  since July 2019 -on Oxygen  3l/m rest and 4l/m with activity      PMH-  mild obstructive sleep apnea declined CPAP Neurogenic bladder with self-catheterization   Meds -Trelegy not working well   01/2023 started Purvis, pred taper  Discussed the use of AI scribe software for clinical note transcription with the patient, who gave verbal consent to proceed.  History of Present Illness Monica Scott is a 68 year old female with chronic respiratory failure and COPD who presents for a follow-up visit.  She experiences worsening dyspnea, especially during ambulation, requiring frequent rest. She uses an Inogen oxygen  concentrator set at three liters per minute, reducing it to two liters when moving slowly to conserve battery life. She uses Trelegy and O2 Wear, with occasional albuterol  for rescue. Dyspnea occurs approximately twice a week.  She has not participated in a pulmonary rehabilitation program and prefers staying at home. She is considering obtaining a walker to assist with mobility, particularly for walking around her apartment complex.    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    NPSG 11/ 2020 mild obstructive sleep apnea with AHI at 7.6 and SPO2 low at 73%.      Alpha-1  antitrypsin-MM, 139   Review of Systems  neg for any significant sore throat, dysphagia, itching, sneezing, nasal congestion or excess/ purulent secretions, fever, chills, sweats, unintended wt loss, pleuritic or exertional cp, hempoptysis, orthopnea pnd or change in chronic leg swelling. Also denies presyncope, palpitations, heartburn, abdominal pain, nausea, vomiting, diarrhea or change in bowel or urinary habits, dysuria,hematuria, rash, arthralgias, visual complaints, headache, numbness weakness or ataxia.      Objective:   Physical Exam  Gen. Pleasant, obese, in no distress ENT - no [place], no post nasal drip Neck: No JVD, no thyromegaly, no carotid bruits Lungs: no use of accessory muscles, no dullness to percussion, decreased without rales or rhonchi  Cardiovascular: Rhythm regular, heart sounds  normal, no murmurs or gallops, no peripheral edema Musculoskeletal: No deformities, no cyanosis or clubbing , no tremors       Assessment & Plan:   Assessment and Plan Assessment & Plan Chronic respiratory failure with hypoxia Well-managed with oxygen  therapy at three liters. She uses a portable oxygen  concentrator (Inogen) and adjusts the flow rate based on activity level. Reports worsening breathing, especially during ambulation, requiring rest. Declined pulmonary rehabilitation due to preference for staying home. - Continue oxygen  therapy at three liters. - Continue use of Inogen with battery for mobility. - Consider obtaining a walker with wheels for support during ambulation.  Chronic obstructive pulmonary disease (COPD) COPD is ongoing with worsening dyspnea, particularly during ambulation. Currently using Trelegy and albuterol  as needed. Declined pulmonary rehabilitation program, which could help improve exercise  tolerance and breathing muscle strength. - Continue Trelegy, ohtuwayre and albuterol  as needed. - Discussed potential benefits of pulmonary rehabilitation, though she  declined.  Hypertension Blood pressure is low today. She is on two antihypertensive medications and has a home blood pressure monitor. Advised to monitor blood pressure and adjust medication if necessary. - Monitor blood pressure at home. - Adjust antihypertensive medications if blood pressure remains low.

## 2024-02-26 ENCOUNTER — Other Ambulatory Visit: Payer: Self-pay | Admitting: Obstetrics and Gynecology

## 2024-02-26 ENCOUNTER — Other Ambulatory Visit: Payer: Self-pay | Admitting: Nurse Practitioner

## 2024-02-26 ENCOUNTER — Other Ambulatory Visit: Payer: Self-pay | Admitting: Pulmonary Disease

## 2024-02-26 DIAGNOSIS — R197 Diarrhea, unspecified: Secondary | ICD-10-CM

## 2024-02-26 DIAGNOSIS — N898 Other specified noninflammatory disorders of vagina: Secondary | ICD-10-CM

## 2024-02-26 DIAGNOSIS — B3731 Acute candidiasis of vulva and vagina: Secondary | ICD-10-CM

## 2024-03-05 ENCOUNTER — Ambulatory Visit

## 2024-03-07 ENCOUNTER — Other Ambulatory Visit: Payer: Self-pay | Admitting: Pulmonary Disease

## 2024-03-19 ENCOUNTER — Encounter: Payer: Self-pay | Admitting: Nurse Practitioner

## 2024-03-20 ENCOUNTER — Telehealth: Payer: Self-pay

## 2024-03-20 NOTE — Telephone Encounter (Signed)
 Copied from CRM #[id number]. Topic: Clinical - Medical Advice >> Mar 19, 2024  9:18 AM [name] wrote: Reason for CRM: Pt called and stated she has a chest cold and she would like a prescription called into the pharmacy.   Routing to [facility], please advise.

## 2024-03-25 ENCOUNTER — Other Ambulatory Visit: Payer: Self-pay | Admitting: Adult Health

## 2024-03-30 ENCOUNTER — Telehealth (HOSPITAL_BASED_OUTPATIENT_CLINIC_OR_DEPARTMENT_OTHER): Payer: Self-pay

## 2024-03-30 MED ORDER — AZITHROMYCIN 250 MG PO TABS: 250.0000 mg | ORAL_TABLET | Freq: Every day | ORAL | 0 refills | Status: AC

## 2024-03-30 NOTE — Telephone Encounter (Signed)
 Copied from CRM [id number]. Topic: Clinical - Medical Advice >> Mar 30, 2024  9:03 AM [name] wrote: Reason for CRM: Pt has a chest cold and would like a prescription sent to her pharmacy.

## 2024-03-30 NOTE — Telephone Encounter (Signed)
 Pt.notified

## 2024-03-30 NOTE — Telephone Encounter (Signed)
 Zpak sent

## 2024-04-03 ENCOUNTER — Encounter: Payer: Self-pay | Admitting: Acute Care

## 2024-04-08 ENCOUNTER — Other Ambulatory Visit: Payer: Self-pay | Admitting: Physician Assistant

## 2024-04-08 ENCOUNTER — Other Ambulatory Visit (HOSPITAL_BASED_OUTPATIENT_CLINIC_OR_DEPARTMENT_OTHER): Payer: Self-pay | Admitting: Pulmonary Disease

## 2024-04-08 DIAGNOSIS — R7303 Prediabetes: Secondary | ICD-10-CM

## 2024-04-09 ENCOUNTER — Encounter: Payer: Self-pay | Admitting: *Deleted

## 2024-04-10 ENCOUNTER — Telehealth (HOSPITAL_BASED_OUTPATIENT_CLINIC_OR_DEPARTMENT_OTHER): Payer: Self-pay

## 2024-04-10 DIAGNOSIS — J4489 Other specified chronic obstructive pulmonary disease: Secondary | ICD-10-CM

## 2024-04-10 NOTE — Telephone Encounter (Signed)
 Copied from CRM [id number]. Topic: Clinical - Medication Question >> Apr 10, 2024 11:30 AM Monica Scott wrote: Reason for CRM: Patient said she needs a new prescription for a nebulizer sent to [facility] on [address].

## 2024-04-12 ENCOUNTER — Ambulatory Visit (INDEPENDENT_AMBULATORY_CARE_PROVIDER_SITE_OTHER): Admitting: Physician Assistant

## 2024-04-12 ENCOUNTER — Encounter: Payer: Self-pay | Admitting: Physician Assistant

## 2024-04-12 DIAGNOSIS — J449 Chronic obstructive pulmonary disease, unspecified: Secondary | ICD-10-CM | POA: Diagnosis not present

## 2024-04-12 DIAGNOSIS — J9611 Chronic respiratory failure with hypoxia: Secondary | ICD-10-CM

## 2024-04-12 DIAGNOSIS — R7303 Prediabetes: Secondary | ICD-10-CM | POA: Diagnosis not present

## 2024-04-12 MED ORDER — WEGOVY 0.25 MG/0.5ML ~~LOC~~ SOAJ: 0.2500 mg | SUBCUTANEOUS | 1 refills | Status: AC

## 2024-04-12 NOTE — Patient Instructions (Signed)
" °  VISIT SUMMARY: During your visit, we discussed weight loss medication options, particularly Wegovy , and reviewed your COPD management and prediabetes status.  YOUR PLAN: MORBID OBESITY: Your BMI is 38.65, and you have experienced significant weight gain. We discussed the potential benefits of Wegovy  for weight loss. -Start Wegovy  at 0.25 mg weekly, and increase to 0.5 mg after four weeks if tolerated. -Make dietary modifications, including smaller, more frequent meals and increased intake of fruits, vegetables, and lean proteins. -Follow up in six weeks to assess progress and adjust treatment as necessary.  CHRONIC RESPIRATORY FAILURE WITH HYPOXIA: You require continuous oxygen  therapy and use a portable oxygen  concentrator. -Continue your current oxygen  therapy regimen.  CHRONIC OBSTRUCTIVE PULMONARY DISEASE (COPD): You have COPD with chronic respiratory failure and are under the care of a pulmonologist. -Continue management with your pulmonologist, Dr. Jude.  PREDIABETES: Your last A1c was 5.9%, and you have reduced your soda consumption, which may have contributed to your borderline prediabetes status. -Continue monitoring your A1c levels. -Keep reducing your soda consumption.                      Contains text generated by Abridge.                                 Contains text generated by Abridge.   "

## 2024-04-12 NOTE — Progress Notes (Signed)
 "   Patient ID: Monica Scott, female    DOB: 06/12/1955, 69 y.o.   MRN: [id number]   Assessment & Plan:  Morbid obesity (HCC) -     Wegovy ; Inject 0.25 mg into the skin once a week.  Dispense: 2 mL; Refill: 1  Chronic respiratory failure with hypoxia (HCC)  Chronic obstructive pulmonary disease, unspecified COPD type (HCC)  Prediabetes      Assessment and Plan Assessment & Plan Morbid obesity BMI of 38.65. Previous trial of Ozempic  was ineffective due to short duration of use. Insurance coverage for weight loss medications is uncertain, but Wegovy  is considered as an option. No history of thyroid cancer, gallbladder issues, or pancreatitis, which are relevant due to the mechanism of action of GLP-1 receptor agonists. Discussed potential side effects including nausea and the need for smaller, more frequent meals. Emphasized the importance of dietary modifications, including reducing carbohydrate intake and increasing protein and fiber. Discussed the potential for improved breathing with weight loss. - Prescribed Wegovy , starting with 0.25 mg weekly, with a plan to increase to 0.5 mg after four weeks if tolerated. - Advised on dietary modifications, including smaller, more frequent meals and increased intake of fruits, vegetables, and lean proteins. - Scheduled follow-up in six weeks to assess progress and adjust treatment as necessary.  Chronic respiratory failure with hypoxia Managed with continuous oxygen  therapy. Currently using a portable oxygen  concentrator, with oxygen  flow adjusted based on activity level. No need to increase oxygen  flow at night. - Continue current oxygen  therapy regimen.  Chronic obstructive pulmonary disease COPD with chronic respiratory failure. Under the care of a pulmonologist, Dr. Jude at the [facility]. - Continue management with pulmonologist.  Prediabetes Last A1c of 5.9% in October. Previous high soda consumption has been reduced,  which may have contributed to borderline prediabetes status. - Continue monitoring A1c levels. - Encouraged continued reduction in soda consumption.      Return in about 6 weeks (around 05/24/2024) for recheck/follow-up.    Subjective:    Chief Complaint  Patient presents with   Medication Problem    Would like to discuss weight loss medications.     HPI Discussed the use of AI scribe software for clinical note transcription with the patient, who gave verbal consent to proceed.  History of Present Illness Monica Scott is a 69 year old female with COPD who presents to discuss weight loss medication options.  She previously tried Ozempic  for one to two months a couple of years ago, which provided minimal benefit, but discontinued due to insurance coverage issues. She is uncertain about her current insurance coverage for weight loss medications but is willing to pay up to a couple of hundred dollars if necessary.  She has a history of prediabetes, with her last A1c recorded at 5.9% in October. She has significantly reduced her soda consumption from six to eight sodas a day, which she believes contributed to her prediabetes. She has not been diagnosed with diabetes.  She reports abnormal weight gain, currently weighing 178 pounds at a height of 4'9. Her weight was previously 152 pounds in 2024, which she found more comfortable. She has difficulty fitting into her clothes and wants to lose weight to improve her comfort and breathing.  She has COPD with chronic respiratory failure and hypoxia, requiring continuous oxygen  therapy. She uses a portable oxygen  concentrator, typically set at two to three liters, increasing to three liters when walking. She does not need to adjust  the oxygen  at night. No history of sleep apnea, fatty liver disease, cardiovascular issues, heart issues, thyroid cancer, gallbladder issues, or pancreatitis.  She has been proactive in dietary changes,  purchasing fish, chicken, vegetables, and fruits to support her weight loss efforts. She avoids high sugar intake, particularly in protein shakes.     Past Medical History:  Diagnosis Date   Arthritis    Asthma    Bladder disease    COPD (chronic obstructive pulmonary disease) (HCC)    Depression    GERD (gastroesophageal reflux disease)    Hyperlipidemia    Hypertension    Peptic ulcer    Urinary retention     Past Surgical History:  Procedure Laterality Date   BIOPSY  04/24/2018   Procedure: BIOPSY;  Surgeon: Shaaron Lamar HERO, MD;  [facility]: [facility];  Service: Endoscopy;;  esophagus   COLONOSCOPY WITH PROPOFOL  N/A 04/24/2018   Procedure: COLONOSCOPY WITH PROPOFOL ;  Surgeon: Shaaron Lamar HERO, MD;  Location: [facility];  Service: Endoscopy;  Laterality: N/A;  9:00am   COLONOSCOPY WITH PROPOFOL  N/A 09/21/2021   Procedure: COLONOSCOPY WITH PROPOFOL ;  Surgeon: Shaaron Lamar HERO, MD;  Location: [facility];  Service: Endoscopy;  Laterality: N/A;  2:15 / ASA 2   ESOPHAGOGASTRODUODENOSCOPY (EGD) WITH PROPOFOL  N/A 04/24/2018   Procedure: ESOPHAGOGASTRODUODENOSCOPY (EGD) WITH PROPOFOL ;  Surgeon: Shaaron Lamar HERO, MD;  Location: [facility];  Service: Endoscopy;  Laterality: N/A;   MALONEY DILATION N/A 04/24/2018   Procedure: AGAPITO DILATION;  Surgeon: Shaaron Lamar HERO, MD;  Location: [facility];  Service: Endoscopy;  Laterality: N/A;   PILONIDAL CYST EXCISION     age 46   POLYPECTOMY  04/24/2018   Procedure: POLYPECTOMY;  Surgeon: Shaaron Lamar HERO, MD;  [facility]: [facility];  Service: Endoscopy;;  colon    POLYPECTOMY  09/21/2021   Procedure: POLYPECTOMY;  Surgeon: Shaaron Lamar HERO, MD;  Location: [facility];  Service: Endoscopy;;   VAGINAL HYSTERECTOMY      Family History  Problem Relation Age of Onset   Alcohol abuse Mother    Arthritis Mother    Cancer Mother    Depression Mother    Early death Mother    Alcohol abuse Father    Depression Father    Alcohol  abuse Sister    Depression Sister    Alcohol abuse Daughter    Depression Daughter    COPD Maternal Grandfather    Heart disease Maternal Grandfather    Hypertension Maternal Grandfather    Alcohol abuse Brother    Colon cancer Brother        deceased at age 17   Alcohol abuse Son    Breast cancer Neg Hx     Social History[1]   Allergies[2]  Review of Systems NEGATIVE UNLESS OTHERWISE INDICATED IN HPI      Objective:     BP 90/60   Pulse 100   Temp 98.1 F (36.7 C) (Temporal)   Resp 14   Ht 4' 9 (1.448 m)   Wt 178 lb 9.6 oz (81 kg)   SpO2 94%   BMI 38.65 kg/m   Wt Readings from Last 3 Encounters:  04/12/24 178 lb 9.6 oz (81 kg)  02/16/24 181 lb 3.2 oz (82.2 kg)  02/03/24 180 lb (81.6 kg)    BP Readings from Last 3 Encounters:  04/12/24 90/60  02/16/24 100/60  02/03/24 109/70     Physical Exam Vitals and nursing note reviewed.  Constitutional:  Appearance: Normal appearance. She is obese.  Eyes:     Extraocular Movements: Extraocular movements intact.     Conjunctiva/sclera: Conjunctivae normal.     Pupils: Pupils are equal, round, and reactive to light.  Cardiovascular:     Rate and Rhythm: Normal rate and regular rhythm.     Heart sounds: Normal heart sounds.  Pulmonary:     Breath sounds: Decreased breath sounds present.     Comments: Portable oxygen  Musculoskeletal:     Right lower leg: No edema.     Left lower leg: No edema.  Neurological:     General: No focal deficit present.     Mental Status: She is alert and oriented to person, place, and time.  Psychiatric:        Mood and Affect: Mood normal.             Kortnee Bas M Gearldene Fiorenza, PA-C    [1]  Social History Tobacco Use   Smoking status: Former    Current packs/day: 0.00    Average packs/day: 1.5 packs/day for 40.0 years (60.0 ttl pk-yrs)    Types: Cigarettes    Start date: 04/19/1970    Quit date: 04/19/2010    Years since quitting: 13.9    Passive exposure: Never    Smokeless tobacco: Never  Vaping Use   Vaping status: Never Used  Substance Use Topics   Alcohol use: No    Comment: Former ETOH. none since 2000   Drug use: No  [2]  Allergies Allergen Reactions   Tizanidine  Other (See Comments)    Caused dizziness and dry mouth. Patient prefers not to take this again.   "

## 2024-04-16 ENCOUNTER — Other Ambulatory Visit: Payer: Self-pay

## 2024-04-16 ENCOUNTER — Other Ambulatory Visit: Payer: Self-pay | Admitting: Nurse Practitioner

## 2024-04-16 DIAGNOSIS — I10 Essential (primary) hypertension: Secondary | ICD-10-CM

## 2024-04-16 DIAGNOSIS — E7800 Pure hypercholesterolemia, unspecified: Secondary | ICD-10-CM

## 2024-04-16 DIAGNOSIS — I700 Atherosclerosis of aorta: Secondary | ICD-10-CM

## 2024-04-16 MED ORDER — ROSUVASTATIN CALCIUM 40 MG PO TABS: 40.0000 mg | ORAL_TABLET | Freq: Every day | ORAL | 3 refills | Status: AC

## 2024-04-17 ENCOUNTER — Other Ambulatory Visit (HOSPITAL_BASED_OUTPATIENT_CLINIC_OR_DEPARTMENT_OTHER): Payer: Self-pay

## 2024-04-17 DIAGNOSIS — J449 Chronic obstructive pulmonary disease, unspecified: Secondary | ICD-10-CM

## 2024-04-17 MED ORDER — ALBUTEROL SULFATE HFA 108 (90 BASE) MCG/ACT IN AERS: 1.0000 | INHALATION_SPRAY | Freq: Four times a day (QID) | RESPIRATORY_TRACT | 5 refills | Status: AC | PRN

## 2024-04-18 ENCOUNTER — Other Ambulatory Visit (HOSPITAL_BASED_OUTPATIENT_CLINIC_OR_DEPARTMENT_OTHER): Payer: Self-pay

## 2024-04-18 MED ORDER — TRELEGY ELLIPTA 200-62.5-25 MCG/ACT IN AEPB: 200.0000 mg | INHALATION_SPRAY | Freq: Every day | RESPIRATORY_TRACT | 5 refills | Status: AC

## 2024-04-20 MED ORDER — AMLODIPINE BESYLATE 10 MG PO TABS: 10.0000 mg | ORAL_TABLET | Freq: Every day | ORAL | 3 refills | Status: AC

## 2024-04-20 MED ORDER — OLMESARTAN MEDOXOMIL 40 MG PO TABS: 40.0000 mg | ORAL_TABLET | Freq: Every day | ORAL | 3 refills | Status: AC

## 2024-04-30 ENCOUNTER — Ambulatory Visit: Admitting: Obstetrics and Gynecology

## 2024-05-04 ENCOUNTER — Ambulatory Visit: Admitting: Obstetrics and Gynecology

## 2024-05-10 ENCOUNTER — Other Ambulatory Visit

## 2024-05-21 ENCOUNTER — Ambulatory Visit: Admitting: Obstetrics and Gynecology
